# Patient Record
Sex: Male | Born: 1955 | Race: White | Hispanic: No | Marital: Married | Smoking: Never smoker
Health system: Southern US, Community
[De-identification: ages and names within clinical notes are randomized; demographics above are authoritative.]

## PROBLEM LIST (undated history)

## (undated) DIAGNOSIS — I10 Essential (primary) hypertension: Secondary | ICD-10-CM

## (undated) DIAGNOSIS — I219 Acute myocardial infarction, unspecified: Secondary | ICD-10-CM

## (undated) DIAGNOSIS — E119 Type 2 diabetes mellitus without complications: Secondary | ICD-10-CM

---

## 2009-07-01 DIAGNOSIS — N529 Male erectile dysfunction, unspecified: Secondary | ICD-10-CM | POA: Insufficient documentation

## 2009-07-01 DIAGNOSIS — E1121 Type 2 diabetes mellitus with diabetic nephropathy: Secondary | ICD-10-CM | POA: Insufficient documentation

## 2009-07-01 DIAGNOSIS — E7800 Pure hypercholesterolemia, unspecified: Secondary | ICD-10-CM | POA: Insufficient documentation

## 2009-07-01 DIAGNOSIS — J302 Other seasonal allergic rhinitis: Secondary | ICD-10-CM | POA: Insufficient documentation

## 2014-04-17 DIAGNOSIS — M5030 Other cervical disc degeneration, unspecified cervical region: Secondary | ICD-10-CM | POA: Insufficient documentation

## 2015-02-07 DIAGNOSIS — Z794 Long term (current) use of insulin: Secondary | ICD-10-CM | POA: Insufficient documentation

## 2015-05-23 DIAGNOSIS — R809 Proteinuria, unspecified: Secondary | ICD-10-CM | POA: Insufficient documentation

## 2019-12-21 DIAGNOSIS — E1165 Type 2 diabetes mellitus with hyperglycemia: Secondary | ICD-10-CM | POA: Insufficient documentation

## 2019-12-21 DIAGNOSIS — H35039 Hypertensive retinopathy, unspecified eye: Secondary | ICD-10-CM | POA: Insufficient documentation

## 2019-12-21 DIAGNOSIS — N182 Chronic kidney disease, stage 2 (mild): Secondary | ICD-10-CM | POA: Insufficient documentation

## 2019-12-21 DIAGNOSIS — D126 Benign neoplasm of colon, unspecified: Secondary | ICD-10-CM | POA: Insufficient documentation

## 2020-02-16 DIAGNOSIS — I2510 Atherosclerotic heart disease of native coronary artery without angina pectoris: Secondary | ICD-10-CM

## 2020-02-16 HISTORY — DX: Atherosclerotic heart disease of native coronary artery without angina pectoris: I25.10

## 2020-02-28 ENCOUNTER — Inpatient Hospital Stay (HOSPITAL_COMMUNITY): Payer: BC Managed Care – PPO

## 2020-02-28 ENCOUNTER — Inpatient Hospital Stay (HOSPITAL_COMMUNITY)
Admission: EM | Admit: 2020-02-28 | Discharge: 2020-03-08 | DRG: 286 | Disposition: A | Discharge status: Rehabilitation Facility | Payer: BC Managed Care – PPO | Attending: Internal Medicine | Admitting: Internal Medicine

## 2020-02-28 ENCOUNTER — Emergency Department (HOSPITAL_COMMUNITY): Payer: BC Managed Care – PPO

## 2020-02-28 ENCOUNTER — Inpatient Hospital Stay (HOSPITAL_COMMUNITY)
Admission: EM | Disposition: A | Discharge status: Rehabilitation Facility | Payer: Self-pay | Source: Home / Self Care | Attending: Internal Medicine

## 2020-02-28 ENCOUNTER — Encounter (HOSPITAL_COMMUNITY): Payer: Self-pay | Admitting: Specialist

## 2020-02-28 DIAGNOSIS — I213 ST elevation (STEMI) myocardial infarction of unspecified site: Secondary | ICD-10-CM | POA: Diagnosis not present

## 2020-02-28 DIAGNOSIS — R4701 Aphasia: Secondary | ICD-10-CM | POA: Diagnosis not present

## 2020-02-28 DIAGNOSIS — I462 Cardiac arrest due to underlying cardiac condition: Secondary | ICD-10-CM | POA: Diagnosis present

## 2020-02-28 DIAGNOSIS — I2510 Atherosclerotic heart disease of native coronary artery without angina pectoris: Secondary | ICD-10-CM

## 2020-02-28 DIAGNOSIS — R569 Unspecified convulsions: Secondary | ICD-10-CM | POA: Diagnosis not present

## 2020-02-28 DIAGNOSIS — R0989 Other specified symptoms and signs involving the circulatory and respiratory systems: Secondary | ICD-10-CM | POA: Diagnosis not present

## 2020-02-28 DIAGNOSIS — F10139 Alcohol abuse with withdrawal, unspecified: Secondary | ICD-10-CM | POA: Diagnosis not present

## 2020-02-28 DIAGNOSIS — E875 Hyperkalemia: Secondary | ICD-10-CM | POA: Diagnosis not present

## 2020-02-28 DIAGNOSIS — E785 Hyperlipidemia, unspecified: Secondary | ICD-10-CM | POA: Diagnosis present

## 2020-02-28 DIAGNOSIS — E118 Type 2 diabetes mellitus with unspecified complications: Secondary | ICD-10-CM | POA: Diagnosis not present

## 2020-02-28 DIAGNOSIS — J690 Pneumonitis due to inhalation of food and vomit: Secondary | ICD-10-CM | POA: Diagnosis not present

## 2020-02-28 DIAGNOSIS — G92 Toxic encephalopathy: Secondary | ICD-10-CM | POA: Diagnosis not present

## 2020-02-28 DIAGNOSIS — I350 Nonrheumatic aortic (valve) stenosis: Secondary | ICD-10-CM

## 2020-02-28 DIAGNOSIS — G9340 Encephalopathy, unspecified: Secondary | ICD-10-CM | POA: Diagnosis not present

## 2020-02-28 DIAGNOSIS — R0603 Acute respiratory distress: Secondary | ICD-10-CM

## 2020-02-28 DIAGNOSIS — G9341 Metabolic encephalopathy: Secondary | ICD-10-CM | POA: Diagnosis not present

## 2020-02-28 DIAGNOSIS — G931 Anoxic brain damage, not elsewhere classified: Secondary | ICD-10-CM | POA: Diagnosis not present

## 2020-02-28 DIAGNOSIS — Z20822 Contact with and (suspected) exposure to covid-19: Secondary | ICD-10-CM | POA: Diagnosis present

## 2020-02-28 DIAGNOSIS — N179 Acute kidney failure, unspecified: Secondary | ICD-10-CM | POA: Diagnosis not present

## 2020-02-28 DIAGNOSIS — E1165 Type 2 diabetes mellitus with hyperglycemia: Secondary | ICD-10-CM | POA: Diagnosis present

## 2020-02-28 DIAGNOSIS — I25111 Atherosclerotic heart disease of native coronary artery with angina pectoris with documented spasm: Secondary | ICD-10-CM | POA: Diagnosis present

## 2020-02-28 DIAGNOSIS — I472 Ventricular tachycardia: Secondary | ICD-10-CM | POA: Diagnosis not present

## 2020-02-28 DIAGNOSIS — I469 Cardiac arrest, cause unspecified: Secondary | ICD-10-CM

## 2020-02-28 DIAGNOSIS — J9601 Acute respiratory failure with hypoxia: Secondary | ICD-10-CM | POA: Diagnosis not present

## 2020-02-28 DIAGNOSIS — E119 Type 2 diabetes mellitus without complications: Secondary | ICD-10-CM | POA: Diagnosis not present

## 2020-02-28 DIAGNOSIS — I10 Essential (primary) hypertension: Secondary | ICD-10-CM | POA: Diagnosis present

## 2020-02-28 DIAGNOSIS — Z0189 Encounter for other specified special examinations: Secondary | ICD-10-CM

## 2020-02-28 DIAGNOSIS — D72829 Elevated white blood cell count, unspecified: Secondary | ICD-10-CM | POA: Diagnosis not present

## 2020-02-28 DIAGNOSIS — I4901 Ventricular fibrillation: Principal | ICD-10-CM

## 2020-02-28 DIAGNOSIS — I25110 Atherosclerotic heart disease of native coronary artery with unstable angina pectoris: Secondary | ICD-10-CM | POA: Diagnosis not present

## 2020-02-28 DIAGNOSIS — T17908A Unspecified foreign body in respiratory tract, part unspecified causing other injury, initial encounter: Secondary | ICD-10-CM

## 2020-02-28 DIAGNOSIS — R471 Dysarthria and anarthria: Secondary | ICD-10-CM | POA: Diagnosis not present

## 2020-02-28 DIAGNOSIS — F1010 Alcohol abuse, uncomplicated: Secondary | ICD-10-CM | POA: Diagnosis present

## 2020-02-28 DIAGNOSIS — R7401 Elevation of levels of liver transaminase levels: Secondary | ICD-10-CM | POA: Diagnosis not present

## 2020-02-28 DIAGNOSIS — I249 Acute ischemic heart disease, unspecified: Secondary | ICD-10-CM | POA: Diagnosis present

## 2020-02-28 DIAGNOSIS — J9600 Acute respiratory failure, unspecified whether with hypoxia or hypercapnia: Secondary | ICD-10-CM

## 2020-02-28 HISTORY — DX: Type 2 diabetes mellitus without complications: E11.9

## 2020-02-28 HISTORY — DX: Essential (primary) hypertension: I10

## 2020-02-28 HISTORY — DX: Cardiac arrest, cause unspecified: I46.9

## 2020-02-28 HISTORY — PX: LEFT HEART CATH AND CORONARY ANGIOGRAPHY: CATH118249

## 2020-02-28 LAB — BASIC METABOLIC PANEL
Anion gap: 11 (ref 5–15)
Anion gap: 15 (ref 5–15)
BUN: 25 mg/dL — ABNORMAL HIGH (ref 8–23)
BUN: 18 mg/dL (ref 8–23)
CO2: 20 mmol/L — ABNORMAL LOW (ref 22–32)
CO2: 19 mmol/L — ABNORMAL LOW (ref 22–32)
Calcium: 8.3 mg/dL — ABNORMAL LOW (ref 8.9–10.3)
Calcium: 8.2 mg/dL — ABNORMAL LOW (ref 8.9–10.3)
Chloride: 108 mmol/L (ref 98–111)
Chloride: 110 mmol/L (ref 98–111)
Creatinine, Ser: 1.29 mg/dL — ABNORMAL HIGH (ref 0.61–1.24)
Creatinine, Ser: 1.1 mg/dL (ref 0.61–1.24)
GFR calc Af Amer: >60 mL/min (ref >60)
GFR calc Af Amer: >60 mL/min (ref >60)
GFR calc non Af Amer: 59 mL/min — ABNORMAL LOW (ref >60)
GFR calc non Af Amer: >60 mL/min (ref >60)
Glucose, Bld: 217 mg/dL — ABNORMAL HIGH (ref 70–99)
Glucose, Bld: 179 mg/dL — ABNORMAL HIGH (ref 70–99)
Potassium: 4.5 mmol/L (ref 3.5–5.1)
Potassium: 5.9 mmol/L — ABNORMAL HIGH (ref 3.5–5.1)
Sodium: 139 mmol/L (ref 135–145)
Sodium: 144 mmol/L (ref 135–145)

## 2020-02-28 LAB — CBC
HCT: 40.5 % (ref 39.0–52.0)
Hemoglobin: 13.5 g/dL (ref 13.0–17.0)
MCH: 32.1 pg (ref 26.0–34.0)
MCHC: 33.3 g/dL (ref 30.0–36.0)
MCV: 96.4 fL (ref 80.0–100.0)
Platelets: 173 10*3/uL (ref 150–400)
RBC: 4.2 MIL/uL — ABNORMAL LOW (ref 4.22–5.81)
RDW: 11.7 % (ref 11.5–15.5)
WBC: 12.7 10*3/uL — ABNORMAL HIGH (ref 4.0–10.5)
nRBC: 0 % (ref 0.0–0.2)

## 2020-02-28 LAB — CBC WITH DIFFERENTIAL/PLATELET
Abs Immature Granulocytes: 0.5 10*3/uL — ABNORMAL HIGH (ref 0.00–0.07)
Basophils Absolute: 0.1 10*3/uL (ref 0.0–0.1)
Basophils Relative: 1 %
Eosinophils Absolute: 0.2 10*3/uL (ref 0.0–0.5)
Eosinophils Relative: 2 %
HCT: 44.8 % (ref 39.0–52.0)
Hemoglobin: 15.1 g/dL (ref 13.0–17.0)
Immature Granulocytes: 4 %
Lymphocytes Relative: 45 %
Lymphs Abs: 5.7 10*3/uL — ABNORMAL HIGH (ref 0.7–4.0)
MCH: 32.6 pg (ref 26.0–34.0)
MCHC: 33.7 g/dL (ref 30.0–36.0)
MCV: 96.8 fL (ref 80.0–100.0)
Monocytes Absolute: 0.9 10*3/uL (ref 0.1–1.0)
Monocytes Relative: 7 %
Neutro Abs: 5 10*3/uL (ref 1.7–7.7)
Neutrophils Relative %: 41 %
Platelets: 211 10*3/uL (ref 150–400)
RBC: 4.63 MIL/uL (ref 4.22–5.81)
RDW: 11.7 % (ref 11.5–15.5)
WBC: 12.4 10*3/uL — ABNORMAL HIGH (ref 4.0–10.5)
nRBC: 0 % (ref 0.0–0.2)

## 2020-02-28 LAB — POCT I-STAT, CHEM 8
BUN: 24 mg/dL — ABNORMAL HIGH (ref 8–23)
Calcium, Ion: 1.22 mmol/L (ref 1.15–1.40)
Chloride: 107 mmol/L (ref 98–111)
Creatinine, Ser: 1.2 mg/dL (ref 0.61–1.24)
Glucose, Bld: 240 mg/dL — ABNORMAL HIGH (ref 70–99)
HCT: 44 % (ref 39.0–52.0)
Hemoglobin: 15 g/dL (ref 13.0–17.0)
Potassium: 4.3 mmol/L (ref 3.5–5.1)
Sodium: 142 mmol/L (ref 135–145)
TCO2: 23 mmol/L (ref 22–32)

## 2020-02-28 LAB — GLUCOSE, CAPILLARY
Glucose-Capillary: 280 mg/dL — ABNORMAL HIGH (ref 70–99)
Glucose-Capillary: 201 mg/dL — ABNORMAL HIGH (ref 70–99)
Glucose-Capillary: 181 mg/dL — ABNORMAL HIGH (ref 70–99)
Glucose-Capillary: 175 mg/dL — ABNORMAL HIGH (ref 70–99)
Glucose-Capillary: 162 mg/dL — ABNORMAL HIGH (ref 70–99)

## 2020-02-28 LAB — POCT I-STAT 7, (LYTES, BLD GAS, ICA,H+H)
Acid-base deficit: 5 mmol/L — ABNORMAL HIGH (ref 0.0–2.0)
Bicarbonate: 21.6 mmol/L (ref 20.0–28.0)
Calcium, Ion: 1.2 mmol/L (ref 1.15–1.40)
HCT: 42 % (ref 39.0–52.0)
Hemoglobin: 14.3 g/dL (ref 13.0–17.0)
O2 Saturation: 100 %
Patient temperature: 36.8
Potassium: 4.5 mmol/L (ref 3.5–5.1)
Sodium: 141 mmol/L (ref 135–145)
TCO2: 23 mmol/L (ref 22–32)
pCO2 arterial: 44.3 mmHg (ref 32.0–48.0)
pH, Arterial: 7.296 — ABNORMAL LOW (ref 7.350–7.450)
pO2, Arterial: 216 mmHg — ABNORMAL HIGH (ref 83.0–108.0)

## 2020-02-28 LAB — COMPREHENSIVE METABOLIC PANEL
ALT: 100 U/L — ABNORMAL HIGH (ref 0–44)
AST: 135 U/L — ABNORMAL HIGH (ref 15–41)
Albumin: 3.9 g/dL (ref 3.5–5.0)
Alkaline Phosphatase: 41 U/L (ref 38–126)
Anion gap: 17 — ABNORMAL HIGH (ref 5–15)
BUN: 20 mg/dL (ref 8–23)
CO2: 15 mmol/L — ABNORMAL LOW (ref 22–32)
Calcium: 8.7 mg/dL — ABNORMAL LOW (ref 8.9–10.3)
Chloride: 105 mmol/L (ref 98–111)
Creatinine, Ser: 1.45 mg/dL — ABNORMAL HIGH (ref 0.61–1.24)
GFR calc Af Amer: 60 mL/min — ABNORMAL LOW (ref >60)
GFR calc non Af Amer: 52 mL/min — ABNORMAL LOW (ref >60)
Glucose, Bld: 268 mg/dL — ABNORMAL HIGH (ref 70–99)
Potassium: 3.8 mmol/L (ref 3.5–5.1)
Sodium: 137 mmol/L (ref 135–145)
Total Bilirubin: 0.4 mg/dL (ref 0.3–1.2)
Total Protein: 6.6 g/dL (ref 6.5–8.1)

## 2020-02-28 LAB — PROTIME-INR
INR: 1.1 (ref 0.8–1.2)
INR: 1.2 (ref 0.8–1.2)
INR: 1.1 (ref 0.8–1.2)
Prothrombin Time: 13.8 seconds (ref 11.4–15.2)
Prothrombin Time: 14.8 seconds (ref 11.4–15.2)
Prothrombin Time: 13.9 seconds (ref 11.4–15.2)

## 2020-02-28 LAB — RAPID URINE DRUG SCREEN, HOSP PERFORMED
Amphetamines: NOT DETECTED
Barbiturates: NOT DETECTED
Benzodiazepines: POSITIVE — AB
Cocaine: NOT DETECTED
Opiates: NOT DETECTED
Tetrahydrocannabinol: NOT DETECTED

## 2020-02-28 LAB — LIPID PANEL
Cholesterol: 272 mg/dL — ABNORMAL HIGH (ref 0–200)
LDL Cholesterol: UNDETERMINED mg/dL (ref 0–99)
Triglycerides: 1391 mg/dL — ABNORMAL HIGH (ref <150)
VLDL: UNDETERMINED mg/dL (ref 0–40)

## 2020-02-28 LAB — LDL CHOLESTEROL, DIRECT: Direct LDL: 61.9 mg/dL (ref 0–99)

## 2020-02-28 LAB — ECHOCARDIOGRAM COMPLETE
Height: 68 in
Weight: 3375.68 oz

## 2020-02-28 LAB — TROPONIN I (HIGH SENSITIVITY)
Troponin I (High Sensitivity): 60 ng/L — ABNORMAL HIGH (ref <18)
Troponin I (High Sensitivity): 992 ng/L (ref <18)

## 2020-02-28 LAB — HEMOGLOBIN A1C
Hgb A1c MFr Bld: 8.2 % — ABNORMAL HIGH (ref 4.8–5.6)
Mean Plasma Glucose: 188.64 mg/dL

## 2020-02-28 LAB — MRSA PCR SCREENING: MRSA by PCR: NEGATIVE

## 2020-02-28 LAB — APTT
aPTT: 27 seconds (ref 24–36)
aPTT: 25 seconds (ref 24–36)
aPTT: 39 seconds — ABNORMAL HIGH (ref 24–36)

## 2020-02-28 LAB — SARS CORONAVIRUS 2 BY RT PCR (HOSPITAL ORDER, PERFORMED IN ~~LOC~~ HOSPITAL LAB): SARS Coronavirus 2: NEGATIVE

## 2020-02-28 LAB — HEPARIN LEVEL (UNFRACTIONATED): Heparin Unfractionated: <0.1 IU/mL — ABNORMAL LOW (ref 0.30–0.70)

## 2020-02-28 SURGERY — LEFT HEART CATH AND CORONARY ANGIOGRAPHY
Anesthesia: LOCAL

## 2020-02-28 MED ORDER — SODIUM CHLORIDE 0.9 % IV SOLN
INTRAVENOUS | Status: AC | PRN
  Administered 2020-02-28: 1000 mL via INTRAVENOUS

## 2020-02-28 MED ORDER — NITROGLYCERIN IN D5W 200-5 MCG/ML-% IV SOLN
INTRAVENOUS | Status: AC
  Filled 2020-02-28: qty 250

## 2020-02-28 MED ORDER — ASPIRIN 81 MG PO CHEW
81.0000 mg | CHEWABLE_TABLET | Freq: Every day | ORAL | Status: DC
  Administered 2020-02-28 – 2020-03-01 (×3): 81 mg
  Filled 2020-02-28 (×3): qty 1

## 2020-02-28 MED ORDER — LABETALOL HCL 5 MG/ML IV SOLN: 10.0000 mg | INTRAVENOUS | Status: AC | PRN

## 2020-02-28 MED ORDER — PROPOFOL 1000 MG/100ML IV EMUL
INTRAVENOUS | Status: AC
  Filled 2020-02-28: qty 100

## 2020-02-28 MED ORDER — SODIUM CHLORIDE 0.9% FLUSH: 3.0000 mL | INTRAVENOUS | Status: DC | PRN

## 2020-02-28 MED ORDER — ATORVASTATIN CALCIUM 80 MG PO TABS: 80.0000 mg | ORAL_TABLET | Freq: Every day | ORAL | Status: DC

## 2020-02-28 MED ORDER — CHLORHEXIDINE GLUCONATE CLOTH 2 % EX PADS
6.0000 | MEDICATED_PAD | Freq: Every day | CUTANEOUS | Status: DC
  Administered 2020-02-29 – 2020-03-05 (×5): 6 via TOPICAL

## 2020-02-28 MED ORDER — FENTANYL 2500MCG IN NS 250ML (10MCG/ML) PREMIX INFUSION
50.0000 ug/h | INTRAVENOUS | Status: DC
  Administered 2020-02-28: 300 ug/h via INTRAVENOUS
  Administered 2020-02-28: 50 ug/h via INTRAVENOUS
  Administered 2020-02-28: 275 ug/h via INTRAVENOUS
  Administered 2020-02-29: 150 ug/h via INTRAVENOUS
  Administered 2020-02-29: 325 ug/h via INTRAVENOUS
  Administered 2020-03-01: 100 ug/h via INTRAVENOUS
  Filled 2020-02-28 (×5): qty 250

## 2020-02-28 MED ORDER — LIDOCAINE HCL (PF) 1 % IJ SOLN
INTRAMUSCULAR | Status: AC
  Filled 2020-02-28: qty 30

## 2020-02-28 MED ORDER — FENTANYL BOLUS VIA INFUSION
10.0000 ug | INTRAVENOUS | Status: DC | PRN
  Filled 2020-02-28: qty 30

## 2020-02-28 MED ORDER — ASPIRIN 300 MG RE SUPP
300.0000 mg | Freq: Once | RECTAL | Status: AC
  Administered 2020-02-28: 300 mg via RECTAL

## 2020-02-28 MED ORDER — SODIUM CHLORIDE 0.9 % IV SOLN
INTRAVENOUS | Status: DC
  Administered 2020-02-29: 1000 mL via INTRAVENOUS

## 2020-02-28 MED ORDER — PROSOURCE TF PO LIQD
45.0000 mL | Freq: Three times a day (TID) | ORAL | Status: DC
  Administered 2020-02-28 – 2020-03-01 (×6): 45 mL
  Filled 2020-02-28 (×6): qty 45

## 2020-02-28 MED ORDER — FENTANYL 2500MCG IN NS 250ML (10MCG/ML) PREMIX INFUSION
0.0000 ug/h | Freq: Once | INTRAVENOUS | Status: AC
  Administered 2020-02-28 (×3): 25 ug/h via INTRAVENOUS
  Filled 2020-02-28: qty 250

## 2020-02-28 MED ORDER — ASPIRIN 81 MG PO CHEW: 324.0000 mg | CHEWABLE_TABLET | Freq: Once | ORAL | Status: DC

## 2020-02-28 MED ORDER — ACETAMINOPHEN 325 MG PO TABS: 650.0000 mg | ORAL_TABLET | ORAL | Status: DC | PRN

## 2020-02-28 MED ORDER — HEPARIN (PORCINE) 25000 UT/250ML-% IV SOLN
1600.0000 [IU]/h | INTRAVENOUS | Status: DC
  Administered 2020-02-28: 900 [IU]/h via INTRAVENOUS
  Administered 2020-02-29: 1500 [IU]/h via INTRAVENOUS
  Administered 2020-03-01: 1600 [IU]/h via INTRAVENOUS
  Administered 2020-03-01: 1650 [IU]/h via INTRAVENOUS
  Administered 2020-03-02 – 2020-03-03 (×2): 1750 [IU]/h via INTRAVENOUS
  Administered 2020-03-03: 1700 [IU]/h via INTRAVENOUS
  Administered 2020-03-04: 1650 [IU]/h via INTRAVENOUS
  Administered 2020-03-04: 1700 [IU]/h via INTRAVENOUS
  Filled 2020-02-28 (×9): qty 250

## 2020-02-28 MED ORDER — CHLORHEXIDINE GLUCONATE 0.12% ORAL RINSE (MEDLINE KIT)
15.0000 mL | Freq: Two times a day (BID) | OROMUCOSAL | Status: DC
  Administered 2020-02-28 – 2020-03-01 (×5): 15 mL via OROMUCOSAL

## 2020-02-28 MED ORDER — ONDANSETRON HCL 4 MG/2ML IJ SOLN: 4.0000 mg | Freq: Four times a day (QID) | INTRAMUSCULAR | Status: DC | PRN

## 2020-02-28 MED ORDER — INSULIN ASPART 100 UNIT/ML ~~LOC~~ SOLN
0.0000 [IU] | Freq: Three times a day (TID) | SUBCUTANEOUS | Status: DC
  Administered 2020-02-28: 3 [IU] via SUBCUTANEOUS

## 2020-02-28 MED ORDER — SODIUM CHLORIDE 0.9 % IV SOLN
INTRAVENOUS | Status: AC | PRN
  Administered 2020-02-28: 50 mL/h via INTRAVENOUS

## 2020-02-28 MED ORDER — FAMOTIDINE IN NACL 20-0.9 MG/50ML-% IV SOLN
20.0000 mg | Freq: Two times a day (BID) | INTRAVENOUS | Status: DC
  Administered 2020-02-28 (×2): 20 mg via INTRAVENOUS
  Filled 2020-02-28 (×2): qty 50

## 2020-02-28 MED ORDER — HEPARIN SODIUM (PORCINE) 5000 UNIT/ML IJ SOLN
4000.0000 [IU] | Freq: Once | INTRAMUSCULAR | Status: AC
  Administered 2020-02-28: 4000 [IU] via INTRAVENOUS
  Filled 2020-02-28: qty 1

## 2020-02-28 MED ORDER — VITAL AF 1.2 CAL PO LIQD
1000.0000 mL | ORAL | Status: DC
  Administered 2020-02-28 – 2020-03-01 (×3): 1000 mL
  Filled 2020-02-28 (×2): qty 1000

## 2020-02-28 MED ORDER — HYDRALAZINE HCL 20 MG/ML IJ SOLN: 10.0000 mg | INTRAMUSCULAR | Status: AC | PRN

## 2020-02-28 MED ORDER — SUCCINYLCHOLINE CHLORIDE 20 MG/ML IJ SOLN
INTRAMUSCULAR | Status: AC | PRN
  Administered 2020-02-28: 100 mg via INTRAVENOUS

## 2020-02-28 MED ORDER — ATORVASTATIN CALCIUM 80 MG PO TABS
80.0000 mg | ORAL_TABLET | Freq: Every day | ORAL | Status: DC
  Administered 2020-02-28 – 2020-03-01 (×3): 80 mg
  Filled 2020-02-28 (×3): qty 1

## 2020-02-28 MED ORDER — ORAL CARE MOUTH RINSE
15.0000 mL | OROMUCOSAL | Status: DC
  Administered 2020-02-28 – 2020-03-01 (×23): 15 mL via OROMUCOSAL

## 2020-02-28 MED ORDER — MIDAZOLAM 50MG/50ML (1MG/ML) PREMIX INFUSION
0.5000 mg/h | Freq: Once | INTRAVENOUS | Status: AC
  Administered 2020-02-28 (×3): 1 mg/h via INTRAVENOUS
  Filled 2020-02-28: qty 50

## 2020-02-28 MED ORDER — IOHEXOL 350 MG/ML SOLN
INTRAVENOUS | Status: AC
  Filled 2020-02-28: qty 1

## 2020-02-28 MED ORDER — PROPOFOL 1000 MG/100ML IV EMUL
0.0000 ug/kg/min | INTRAVENOUS | Status: DC
  Administered 2020-02-28: 5 ug/kg/min via INTRAVENOUS
  Administered 2020-02-28: 25 ug/kg/min via INTRAVENOUS
  Administered 2020-02-29: 35 ug/kg/min via INTRAVENOUS
  Administered 2020-02-29: 25 ug/kg/min via INTRAVENOUS
  Administered 2020-02-29: 10 ug/kg/min via INTRAVENOUS
  Administered 2020-03-01: 30 ug/kg/min via INTRAVENOUS
  Filled 2020-02-28 (×6): qty 100

## 2020-02-28 MED ORDER — NOREPINEPHRINE 4 MG/250ML-% IV SOLN: 0.0000 ug/min | INTRAVENOUS | Status: DC

## 2020-02-28 MED ORDER — VERAPAMIL HCL 2.5 MG/ML IV SOLN
INTRAVENOUS | Status: DC | PRN
  Administered 2020-02-28: 10 mL via INTRA_ARTERIAL

## 2020-02-28 MED ORDER — NITROGLYCERIN IN D5W 200-5 MCG/ML-% IV SOLN
0.0000 ug/min | INTRAVENOUS | Status: DC
  Administered 2020-02-28: 20 ug/min via INTRAVENOUS

## 2020-02-28 MED ORDER — SODIUM CHLORIDE 0.9 % IV SOLN: INTRAVENOUS | Status: AC

## 2020-02-28 MED ORDER — SODIUM CHLORIDE 0.9 % IV SOLN: 250.0000 mL | INTRAVENOUS | Status: DC | PRN

## 2020-02-28 MED ORDER — VERAPAMIL HCL 2.5 MG/ML IV SOLN
INTRAVENOUS | Status: AC
  Filled 2020-02-28: qty 2

## 2020-02-28 MED ORDER — LIDOCAINE HCL (PF) 1 % IJ SOLN
INTRAMUSCULAR | Status: DC | PRN
  Administered 2020-02-28: 2 mL

## 2020-02-28 MED ORDER — SODIUM CHLORIDE 0.9% FLUSH
3.0000 mL | Freq: Two times a day (BID) | INTRAVENOUS | Status: DC
  Administered 2020-02-28 – 2020-03-08 (×15): 3 mL via INTRAVENOUS

## 2020-02-28 MED ORDER — IOHEXOL 350 MG/ML SOLN
INTRAVENOUS | Status: DC | PRN
  Administered 2020-02-28: 60 mL via INTRA_ARTERIAL

## 2020-02-28 MED ORDER — SODIUM CHLORIDE 0.9 % IV SOLN: INTRAVENOUS | Status: DC

## 2020-02-28 MED ORDER — HEPARIN (PORCINE) IN NACL 1000-0.9 UT/500ML-% IV SOLN
INTRAVENOUS | Status: AC
  Filled 2020-02-28: qty 500

## 2020-02-28 MED ORDER — ACETAMINOPHEN 160 MG/5ML PO SOLN
650.0000 mg | ORAL | Status: DC | PRN
  Administered 2020-02-28 – 2020-03-01 (×3): 650 mg
  Filled 2020-02-28 (×4): qty 20.3

## 2020-02-28 MED ORDER — NITROGLYCERIN IN D5W 200-5 MCG/ML-% IV SOLN
INTRAVENOUS | Status: AC | PRN
  Administered 2020-02-28: 10 ug/min via INTRAVENOUS

## 2020-02-28 MED ORDER — ASPIRIN 81 MG PO CHEW: 81.0000 mg | CHEWABLE_TABLET | Freq: Every day | ORAL | Status: DC

## 2020-02-28 MED ORDER — INSULIN ASPART 100 UNIT/ML ~~LOC~~ SOLN
0.0000 [IU] | SUBCUTANEOUS | Status: DC
  Administered 2020-02-28 – 2020-02-29 (×4): 2 [IU] via SUBCUTANEOUS
  Administered 2020-02-29 – 2020-03-01 (×3): 1 [IU] via SUBCUTANEOUS
  Administered 2020-03-01: 2 [IU] via SUBCUTANEOUS
  Administered 2020-03-01: 1 [IU] via SUBCUTANEOUS
  Administered 2020-03-01: 2 [IU] via SUBCUTANEOUS
  Administered 2020-03-01 – 2020-03-03 (×6): 1 [IU] via SUBCUTANEOUS
  Administered 2020-03-04: 2 [IU] via SUBCUTANEOUS
  Administered 2020-03-04 (×2): 1 [IU] via SUBCUTANEOUS
  Administered 2020-03-04: 5 [IU] via SUBCUTANEOUS
  Administered 2020-03-04 (×2): 1 [IU] via SUBCUTANEOUS
  Administered 2020-03-05: 5 [IU] via SUBCUTANEOUS
  Administered 2020-03-05: 2 [IU] via SUBCUTANEOUS
  Administered 2020-03-05: 3 [IU] via SUBCUTANEOUS
  Administered 2020-03-05: 2 [IU] via SUBCUTANEOUS

## 2020-02-28 MED ORDER — FENTANYL CITRATE (PF) 100 MCG/2ML IJ SOLN
50.0000 ug | Freq: Once | INTRAMUSCULAR | Status: AC
  Administered 2020-02-28: 50 ug via INTRAVENOUS

## 2020-02-28 SURGICAL SUPPLY — 13 items
CATH INFINITI 5 FR JL3.5 (CATHETERS) ×2 IMPLANT
CATH INFINITI JR4 5F (CATHETERS) ×2 IMPLANT
CATH OPTITORQUE TIG 4.0 5F (CATHETERS) ×2 IMPLANT
DEVICE RAD COMP TR BAND LRG (VASCULAR PRODUCTS) ×2 IMPLANT
GLIDESHEATH SLEND SS 6F .021 (SHEATH) ×2 IMPLANT
GUIDEWIRE INQWIRE 1.5J.035X260 (WIRE) ×1 IMPLANT
INQWIRE 1.5J .035X260CM (WIRE) ×2
KIT ENCORE 26 ADVANTAGE (KITS) ×2 IMPLANT
KIT HEART LEFT (KITS) ×2 IMPLANT
PACK CARDIAC CATHETERIZATION (CUSTOM PROCEDURE TRAY) ×2 IMPLANT
SHEATH PROBE COVER 6X72 (BAG) ×2 IMPLANT
TRANSDUCER W/STOPCOCK (MISCELLANEOUS) ×2 IMPLANT
TUBING CIL FLEX 10 FLL-RA (TUBING) ×2 IMPLANT

## 2020-02-28 NOTE — Progress Notes (Signed)
RT attempted aline twice with any success. Second RT attempted twice without any success. RN aware. RT will draw ABG.

## 2020-02-28 NOTE — Progress Notes (Signed)
EEG complete - results pending 

## 2020-02-28 NOTE — Progress Notes (Signed)
ANTICOAGULATION CONSULT NOTE  Pharmacy Consult for heparin Indication: CAD awaiting CVTS consult  No Known Allergies  Patient Measurements: Height: 5\' 8"  (172.7 cm) Weight: 95.7 kg (210 lb 15.7 oz) IBW/kg (Calculated) : 68.4 Heparin Dosing Weight: 90kg  Vital Signs: Temp: 97.7 F (36.5 C) (07/13 1800) Temp Source: Bladder (07/13 1700) BP: 128/73 (07/13 1800) Pulse Rate: 72 (07/13 1800)  Labs: Recent Labs    02/28/20 0126 02/28/20 0126 02/28/20 0506 02/28/20 0543 02/28/20 1609  HGB 15.1   < > 14.3 13.5  --   HCT 44.8  --  42.0 40.5  --   PLT 211  --   --  173  --   APTT 27  --   --  25  --   LABPROT 13.8  --   --  14.8  --   INR 1.1  --   --  1.2  --   HEPARINUNFRC  --   --   --   --  <0.10*  CREATININE 1.45*  --   --  1.29*  --   TROPONINIHS 60*  --   --  992*  --    < > = values in this interval not displayed.    Estimated Creatinine Clearance: 67.4 mL/min (A) (by C-G formula based on SCr of 1.29 mg/dL (H)).   Assessment: 64yo male had witnessed VF arrest, called as code STEMI and sent emergently to cath lab which revealed diffuse multivessel CAD >> awaiting CVTS consult.  Heparin resumed post cath and heparin level is sub-therapeutic.  No issue with heparin infusion nor bleeding per RN.  Goal of Therapy:  Heparin level 0.3-0.7 units/ml Monitor platelets by anticoagulation protocol: Yes   Plan:  Increase heparin gtt to 1200 units/hr Check 6 hr heparin level  Parlee Amescua D. Mina Marble, PharmD, BCPS, [place] 02/28/2020, 6:05 PM

## 2020-02-28 NOTE — Progress Notes (Signed)
Wasted remainder of Versed drip (29ml) with Diamantina Providence, RN in stericycle.

## 2020-02-28 NOTE — ED Notes (Signed)
Rectal aspirin given

## 2020-02-28 NOTE — Progress Notes (Signed)
eLink Physician-Brief Progress Note Patient Name: Richard Hatfield DOB: 07/15/1956 MRN: [id number]   Date of Service  02/28/2020  HPI/Events of Note  BP soft = 75/56 - Nursing request for A-line.   eICU Interventions  Plan: 1. RT to place A-line.      Intervention Category Major Interventions: Hypotension - evaluation and management  Onetta Spainhower [place] 02/28/2020, 4:12 AM

## 2020-02-28 NOTE — Progress Notes (Addendum)
ANTICOAGULATION CONSULT NOTE - Initial Consult  Pharmacy Consult for heparin Indication: CAD awaiting CVTS consult  Not on File  Patient Measurements: Height: 5\' 8"  (172.7 cm) Weight: 95.7 kg (210 lb 15.7 oz) IBW/kg (Calculated) : 68.4 Heparin Dosing Weight: 90kg  Vital Signs: Temp: 98.1 F (36.7 C) (07/13 0315) Temp Source: Oral (07/13 0315) BP: 133/84 (07/13 0315) Pulse Rate: 75 (07/13 0315)  Labs: Recent Labs    02/28/20 0126  HGB 15.1  HCT 44.8  PLT 211  APTT 27  LABPROT 13.8  INR 1.1  CREATININE 1.45*  TROPONINIHS 60*    Estimated Creatinine Clearance: 60 mL/min (A) (by C-G formula based on SCr of 1.45 mg/dL (H)).   Assessment: 64yo male had witnessed VF arrest, called as code STEMI and sent emergently to cath lab which revealed diffuse multivessel CAD >> awaiting CVTS consult, to begin heparin 8h after sheath removal (removed 7/13 0245).  Goal of Therapy:  Heparin level 0.3-0.7 units/ml Monitor platelets by anticoagulation protocol: Yes   Plan:  At 1045 will begin heparin gtt at 1200 units/hr and monitor heparin levels and CBC.  Wynona Neat, PharmD, BCPS  02/28/2020,3:17 AM   Addendum: Pt now to begin therapeutic hypothermia.  Will lower starting heparin rate to 900 units/hr and monitor closely. VB 3:56 AM

## 2020-02-28 NOTE — ED Provider Notes (Signed)
[facility] EMERGENCY DEPARTMENT Provider Note   CSN: [id number] Arrival date & time: 02/28/20  0113  History Chief Complaint  Patient presents with  . Code STEMI    Richard Hatfield is a 64 y.o. male.  The history is provided by the EMS personnel. The history is limited by the condition of the patient (Patient unresponsive).  He was brought in by ambulance following cardiac arrest and successful return of spontaneous circulation.  His wife reported that he woke up and acted funny and collapsed on the bed.  She called 911 who instructed her on bystander CPR.  EMS arrived and noted initial rhythm of ventricular fibrillation.  He received epinephrine and was defibrillated with successful return of spontaneous circulation.  EMS states 12 minutes of CPR by them and 20 minutes of CPR total.  He was intubated with a King airway and had a right tibial intraosseous line inserted.  He is reported to have a history of hypertension but no known cardiac history.  No past medical history on file.  There are no problems to display for this patient.   ** The histories are not reviewed yet. Please review them in the "History" navigator section and refresh this [place].     No family history on file.  Social History   Tobacco Use  . Smoking status: Not on file  Substance Use Topics  . Alcohol use: Not on file  . Drug use: Not on file    Home Medications Prior to Admission medications   Not on File    Allergies    Patient has no allergy information on record.  Review of Systems   Review of Systems  Unable to perform ROS: Patient unresponsive    Physical Exam Updated Vital Signs BP 136/74   Pulse 82   Resp (!) 22   Ht 5\' 8"  (1.727 m)   Wt 95.8 kg   SpO2 100%   BMI 32.13 kg/m   Physical Exam Vitals and nursing note reviewed.   64 year old male intubated with [facility] airway.  There is some spontaneous respiratory effort. Vital signs are significant for  slightly increased respiratory rate. Oxygen saturation is 100%, which is normal. Head is normocephalic and atraumatic.  Pupils are 5 mm and unreactive.  King airway is in place. Neck has no adenopathy or JVD. Lungs are clear without rales, wheezes, or rhonchi. Chest moves symmetrically.  There is no crepitus. Heart has regular rate and rhythm without murmur. Abdomen is soft, flat. Extremities have trace edema. Skin is warm and dry without rash. Neurologic: Unresponsive to deep painful stimuli, no spontaneous movement.  Slight respiratory effort noted.   ED Results / Procedures / Treatments   Labs (all labs ordered are listed, but only abnormal results are displayed) Labs Reviewed  HEMOGLOBIN A1C - Abnormal; Notable for the following components:      Result Value   Hgb A1c MFr Bld 8.2 (*)    All other components within normal limits  CBC WITH DIFFERENTIAL/PLATELET - Abnormal; Notable for the following components:   WBC 12.4 (*)    Lymphs Abs 5.7 (*)    Abs Immature Granulocytes 0.50 (*)    All other components within normal limits  SARS CORONAVIRUS 2 BY RT PCR (HOSPITAL ORDER, [facility])  PROTIME-INR  APTT  COMPREHENSIVE METABOLIC PANEL  LIPID PANEL  BLOOD GAS, ARTERIAL  TROPONIN I (HIGH SENSITIVITY)    EKG EKG Interpretation  Date/Time:  Tuesday February 28 2020 01:15:31 EDT Ventricular Rate:  80 PR Interval:    QRS Duration: 84 QT Interval:  338 QTC Calculation: 390 R Axis:   73 Text Interpretation: Sinus rhythm Low voltage, precordial leads Anteroseptal infarct, old Nonspecific T abnormalities, lateral leads Slight ST elevation V3, V4, V5 Confirmed by Delora Fuel ([id number]) on 02/28/2020 1:34:06 AM   Radiology DG Chest Portable 1 View  Result Date: 02/28/2020 CLINICAL DATA:  64 year old male intubated.  Cardiac arrest at home. EXAM: PORTABLE CHEST 1 VIEW COMPARISON:  None. FINDINGS: Portable AP supine view at 0134 hours. Endotracheal tube tip at  the level the clavicles. Enteric tube courses into the left upper quadrant, tip not included. Mildly low lung volumes. Normal cardiac size and mediastinal contours. Allowing for portable technique the lungs are clear. No acute osseous abnormality identified. Paucity of bowel gas. IMPRESSION: 1. Satisfactory placement of endotracheal tube and enteric tube. 2.  No acute cardiopulmonary abnormality. Electronically Signed   By: Genevie Ann M.D.   On: 02/28/2020 01:45    Procedures Date/Time: 02/28/2020 1:25 AM Performed by: Delora Fuel, MD Pre-anesthesia Checklist: Patient identified, Emergency Drugs available, Suction available, Patient being monitored and Timeout performed Oxygen Delivery Method: Ambu bag Preoxygenation: Pre-oxygenation with 100% oxygen Induction Type: IV induction Laryngoscope Size: Glidescope and 4 Grade View: Grade I Tube size: 8.0 mm Number of attempts: 1 Airway Equipment and Method: Video-laryngoscopy and Rigid stylet Placement Confirmation: ETT inserted through vocal cords under direct vision,  Positive ETCO2 and Breath sounds checked- equal and bilateral Secured at: 25 cm Tube secured with: ETT holder Dental Injury: Teeth and Oropharynx as per pre-operative assessment  Comments: Pre procedure ventilation through [facility] Airway. Post procedure x-ray obtained showing satisfavtory tube placement.       CRITICAL CARE Performed by: Delora Fuel Total critical care time: 45 minutes Critical care time was exclusive of separately billable procedures and treating other patients. Critical care was necessary to treat or prevent imminent or life-threatening deterioration. Critical care was time spent personally by me on the following activities: development of treatment plan with patient and/or surrogate as well as nursing, discussions with consultants, evaluation of patient's response to treatment, examination of patient, obtaining history from patient or surrogate, ordering and  performing treatments and interventions, ordering and review of laboratory studies, ordering and review of radiographic studies, pulse oximetry and re-evaluation of patient's condition.  Medications Ordered in ED Medications  0.9 %  sodium chloride infusion ( Intravenous New Bag/Given 02/28/20 0144)  heparin injection 4,000 Units (4,000 Units Intravenous Given 02/28/20 0141)  succinylcholine (ANECTINE) injection (100 mg Intravenous Given 02/28/20 0124)  0.9 %  sodium chloride infusion (1,000 mLs Intravenous New Bag/Given 02/28/20 0125)  aspirin suppository 300 mg (300 mg Rectal Given 02/28/20 0145)    ED Course  I have reviewed the triage vital signs and the nursing notes.  Pertinent labs & imaging results that were available during my care of the patient were reviewed by me and considered in my medical decision making (see chart for details).  MDM Rules/Calculators/A&P Out of hospital cardiac arrest from ventricular fibrillation with successful resuscitation and return of spontaneous circulation.  ECG transmitted by EMS showed slight ST elevation in V4 and V5, so code STEMI was activated.  ECG here also shows very minimal ST elevation in V3-V4-V5.  Patient was seen in conjunction with Dr. Ailene Ravel cardiology service.  Case discussed with Dr. Claiborne Billings, on-call for STEMI, who agrees to take the patient to the catheterization lab.  King airway  was removed and ET tube placed.  Case is discussed with Dr. Oletta Darter of critical care service who agrees to see the patient in consultation.  Critical care service will make decision on whether to do therapeutic hypothermia.  Final Clinical Impression(s) / ED Diagnoses Final diagnoses:  Cardiac arrest ([place])  Acute ST elevation myocardial infarction (STEMI), unspecified artery [facility])    Rx / DC Orders ED Discharge Orders    None       Delora Fuel, MD 50/53/97 [phone]

## 2020-02-28 NOTE — Progress Notes (Signed)
Pt transported to Cath Lab from ED Trauma B without complication. Pt respiratory status stable throughout transport. RT will continue to monitor.

## 2020-02-28 NOTE — Progress Notes (Signed)
Initial Nutrition Assessment  DOCUMENTATION CODES:   Not applicable  INTERVENTION:   Tube feeding:  -Vital AF 1.2 @ 60 ml/hr via OGT (1440 ml) -45 ml ProSource TID  Provides: 1848 kcal (1925 kcal with propofol), 141 grams protein, 1168 ml free water.   NUTRITION DIAGNOSIS:   Increased nutrient needs related to acute illness as evidenced by estimated needs.  GOAL:   Patient will meet greater than or equal to 90% of their needs   MONITOR:   Vent status, Skin, TF tolerance, Weight trends, Labs, I & O's  REASON FOR ASSESSMENT:   Ventilator    ASSESSMENT:   Patient with PMH significant for CAD. Presents this admission s/p cardiac arrest.  7/13- heart cath, no stenting   Pt discussed during ICU rounds and with RN.   On TTM36. Undergoing EEG. Off pressors. Requiring low dose propofol. Okay to start feeding per CCM. OG confirmed in stomach per CXR.   Weight history limited over the last year. Utilize 95.7 kg as EDW for now.   Patient is currently intubated on ventilator support MV: 10.3 L/min Temp (24hrs), Avg:97.3 F (36.3 C), Min:96.6 F (35.9 C), Max:98.2 F (36.8 C)  Propofol: 2.9 ml/hr- provides 77 kcal from lipids daily    I/O: -491 ml since admit  UOP: 1,055 ml x 24 hrs   Drips: NS @ 125 ml/hr, propofol Medications: SS novolog  Labs: CBG 181-280  NUTRITION - FOCUSED PHYSICAL EXAM:    Most Recent Value  Orbital Region No depletion  Upper Arm Region No depletion  Thoracic and Lumbar Region Unable to assess  Buccal Region Unable to assess  Temple Region No depletion  Clavicle Bone Region No depletion  Clavicle and Acromion Bone Region No depletion  Scapular Bone Region Unable to assess  Dorsal Hand Unable to assess  Patellar Region Unable to assess  Anterior Thigh Region Unable to assess  Posterior Calf Region Unable to assess  Edema (RD Assessment) Unable to assess  Hair Reviewed  Eyes Unable to assess  Mouth Unable to assess  Skin Reviewed   Nails Unable to assess     Diet Order:   Diet Order            Diet NPO time specified  Diet effective now                 EDUCATION NEEDS:   Not appropriate for education at this time  Skin:  Skin Assessment: Reviewed RN Assessment  Last BM:  PTA  Height:   Ht Readings from Last 1 Encounters:  02/28/20 5\' 8"  (1.727 m)    Weight:   Wt Readings from Last 1 Encounters:  02/28/20 95.7 kg    Ideal Body Weight:  70 kg  BMI:  Body mass index is 32.08 kg/m.  Estimated Nutritional Needs:   Kcal:  1918 kcal  Protein:  140-160 grams  Fluid:  >/= 1.9 L/day   Mariana Single RD, LDN Clinical Nutrition Pager listed in [place]

## 2020-02-28 NOTE — Progress Notes (Signed)
Seen, no events. Does wake up but nonpurposeful. Finish out usual TTM protocol, check EEG, f/u head CT read.  Erskine Emery MD PCCM

## 2020-02-28 NOTE — Progress Notes (Signed)
Cardiology Rounding Note:  Pt admitted earlier this am following cardiac arrest. Please see Dr. Evette Georges full cardiology note and cath note from this am. Cardiac cath with diffuse three vessel CAD but no focal culprits for PCI. No stenting performed. He is now being cooled by the PCCM team.   Cardiology will follow along. No new recommendations this am.   Lauree Chandler 02/28/2020 10:13 AM

## 2020-02-28 NOTE — Procedures (Signed)
Intubation Procedure Note  Ronson Hagins  [id number]  08-27-1955  Date:02/28/20  Time:2:21 AM   Provider Performing:Skilar Marcou M Verona Hartshorn    Procedure: Intubation (36681)  Indication(s) Respiratory Failure  Consent Unable to obtain consent due to emergent nature of procedure.   Anesthesia    Time Out Verified patient identification, verified procedure, site/side was marked, verified correct patient position, special equipment/implants available, medications/allergies/relevant history reviewed, required imaging and test results available.   Sterile Technique Usual hand hygeine, masks, and gloves were used   Procedure Description Patient positioned in bed supine.  Sedation given as noted above.  Patient was intubated with endotracheal tube using Glidescope.  View was Grade 1 full glottis .  Number of attempts was 1.  Colorimetric CO2 detector was consistent with tracheal placement.   Complications/Tolerance None; patient tolerated the procedure well. Chest X-ray is ordered to verify placement.   EBL  Pt intubated by ED MD.    Specimen(s) None  Cordella Register, RRT, RCP

## 2020-02-28 NOTE — Plan of Care (Signed)
  Problem: Clinical Measurements: Goal: Will remain free from infection Outcome: Progressing Goal: Diagnostic test results will improve Outcome: Progressing   Problem: Coping: Goal: Level of anxiety will decrease Outcome: Progressing   Problem: Elimination: Goal: Will not experience complications related to bowel motility Outcome: Progressing Goal: Will not experience complications related to urinary retention Outcome: Progressing   Problem: Pain Managment: Goal: General experience of comfort will improve Outcome: Progressing   Problem: Safety: Goal: Ability to remain free from injury will improve Outcome: Progressing   Problem: Skin Integrity: Goal: Risk for impaired skin integrity will be minimized. Outcome: Progressing   Problem: Cardiovascular: Goal: Vascular access site(s) Level 0-1 will be maintained Outcome: Progressing   Problem: Education: Goal: Knowledge of General Education information will improve Description: Including pain rating scale, medication(s)/side effects and non-pharmacologic comfort measures Outcome: Not Progressing   Problem: Health Behavior/Discharge Planning: Goal: Ability to manage health-related needs will improve Outcome: Not Progressing   Problem: Clinical Measurements: Goal: Ability to maintain clinical measurements within normal limits will improve Outcome: Not Progressing Goal: Respiratory complications will improve Outcome: Not Progressing Goal: Cardiovascular complication will be avoided Outcome: Not Progressing   Problem: Activity: Goal: Risk for activity intolerance will decrease Outcome: Not Progressing   Problem: Nutrition: Goal: Adequate nutrition will be maintained Outcome: Not Progressing   Problem: Skin Integrity: Goal: Risk for impaired skin integrity will decrease Outcome: Not Progressing   Problem: Activity: Goal: Ability to tolerate increased activity will improve Outcome: Not Progressing   Problem:  Respiratory: Goal: Ability to maintain a clear airway and adequate ventilation will improve Outcome: Not Progressing   Problem: Role Relationship: Goal: Method of communication will improve Outcome: Not Progressing   Problem: Education: Goal: Ability to manage disease process will improve Outcome: Not Progressing   Problem: Cardiac: Goal: Ability to achieve and maintain adequate cardiopulmonary perfusion will improve Outcome: Not Progressing   Problem: Neurologic: Goal: Promote progressive neurologic recovery Outcome: Not Progressing   Problem: Education: Goal: Understanding of CV disease, CV risk reduction, and recovery process will improve Outcome: Not Progressing Goal: Individualized Educational Video(s) Outcome: Not Progressing   Problem: Activity: Goal: Ability to return to baseline activity level will improve Outcome: Not Progressing   Problem: Cardiovascular: Goal: Ability to achieve and maintain adequate cardiovascular perfusion will improve Outcome: Not Progressing   Problem: Health Behavior/Discharge Planning: Goal: Ability to safely manage health-related needs after discharge will improve Outcome: Not Progressing

## 2020-02-28 NOTE — ED Notes (Signed)
To cath lab.

## 2020-02-28 NOTE — Progress Notes (Signed)
LTM EEG hooked up and running - no initial skin breakdown - push button tested - neuro notified.  

## 2020-02-28 NOTE — Consult Note (Signed)
NAME:  Richard Hatfield, MRN:  [id number], DOB:  [birth year], LOS: 0 ADMISSION DATE:  02/28/2020, CONSULTATION DATE: 02/28/2020 referring MD: Cardiology, CHIEF COMPLAINT: Cardiac arrest  Brief History   Patient is a 64 year old status post cardiac arrest with resuscitated with 2  History of present illness   Patient is a 64 year old white male without known history of coronary disease who complained of chest pain earlier this evening his wife called 87 and attempted to administer CPR on the scene.  EMTs arrived to proceed CPR for about 10 minutes with 2 cardioversions and 2 epinephrine doses for ventricular tachycardia with return of ROSC.  He was reintubated in the emergency room and emergently taken to the Cath Lab.  He was found to have mild diffuse disease with vasospasm.  I am seeing him in the ICU.  On my evaluation the patient is off of sedation he says received sedation earlier in the evening I believe consistent with fentanyl and Versed.  He has brisk pupillary reflexes, sluggish corneal reflexes, sluggish oculogyric movement but does overbreathing the ventilator.  The nursing staff says he responded on arrival to deep sternal rub with raising his arms. Laboratories were relatively unremarkable, he has a slight elevation in his LFTs, his creatinine is 1.4.  Past Medical History  No known prior medical history.  Is a non-smoker and drinks occasionally  Significant Hospital Events   Cardiac catheterization 02/28/2020  Consults:  Cardiology PCCM  Procedures:  As above  Significant Diagnostic Tests:  As above  Micro Data:  NA  Antimicrobials:  NA  Interim history/subjective:  NA  Objective   Blood pressure 133/84, pulse 75, temperature 98.1 F (36.7 C), temperature source Oral, resp. rate (!) 22, height 5\' 8"  (1.727 m), weight 95.7 kg, SpO2 99 %.    Vent Mode: PRVC FiO2 (%):  [100 %] 100 % Set Rate:  [16 bmp] 16 bmp Vt Set:  [540 mL] 540 mL PEEP:  [5 cmH20] 5 cmH20 Plateau  Pressure:  [17 cmH20] 17 cmH20  No intake or output data in the 24 hours ending 02/28/20 0354 Filed Weights   02/28/20 0119 02/28/20 0315  Weight: 95.8 kg 95.7 kg    Examination: General: Moderately obese male intubated poorly responsive except to deep pain HENT: Unremarkable Lungs: Diminished bilaterally but clear Cardiovascular: Regular rate and rhythm Abdomen: Rotund benign Extremities: Within normal limits Neuro: As above GU: Normal  Resolved Hospital Problem list   NA  Assessment & Plan:  1.  Post cardiac arrest: We will cool patient to 36 degrees.  Monitor in ICU setting.  Cardiology started heparin.  2.  Respiratory failure requiring mechanical ventilation: We will assist with ventilatory management.  3.  Hyperglycemia by initial labs: We will place on sliding scale.    Best practice:  Diet: N.p.o. Pain/Anxiety/Delirium protocol (if indicated): Fentanyl VAP protocol (if indicated): Yes DVT prophylaxis: IV heparin GI prophylaxis: Pepcid Glucose control: We will monitor Mobility: Bedrest Code Status: Full Family Communication: Discussed with wife Disposition: To to heart for further therapy  Labs   CBC: Recent Labs  Lab 02/28/20 0126  WBC 12.4*  NEUTROABS 5.0  HGB 15.1  HCT 44.8  MCV 96.8  PLT 182    Basic Metabolic Panel: Recent Labs  Lab 02/28/20 0126  NA 137  K 3.8  CL 105  CO2 15*  GLUCOSE 268*  BUN 20  CREATININE 1.45*  CALCIUM 8.7*   GFR: Estimated Creatinine Clearance: 60 mL/min (A) (by C-G formula based on SCr  of 1.45 mg/dL (H)). Recent Labs  Lab 02/28/20 0126  WBC 12.4*    Liver Function Tests: Recent Labs  Lab 02/28/20 0126  AST 135*  ALT 100*  ALKPHOS 41  BILITOT 0.4  PROT 6.6  ALBUMIN 3.9   No results for input(s): LIPASE, AMYLASE in the last 168 hours. No results for input(s): AMMONIA in the last 168 hours.  ABG No results found for: PHART, PCO2ART, PO2ART, HCO3, TCO2, ACIDBASEDEF, O2SAT   Coagulation  Profile: Recent Labs  Lab 02/28/20 0126  INR 1.1    Cardiac Enzymes: No results for input(s): CKTOTAL, CKMB, CKMBINDEX, TROPONINI in the last 168 hours.  HbA1C: Hgb A1c MFr Bld  Date/Time Value Ref Range Status  02/28/2020 01:26 AM 8.2 (H) 4.8 - 5.6 % Final    Comment:    (NOTE) Pre diabetes:          5.7%-6.4%  Diabetes:              >6.4%  Glycemic control for   <7.0% adults with diabetes     CBG: Recent Labs  Lab 02/28/20 0322  GLUCAP 280*    Review of Systems:   Unable to obtain.  Wife says there were no antecedent symptoms.  Past Medical History  He,  has no past medical history on file.   Surgical History   Currently unavailable  Social History      Family History   His family history is not on file.   Allergies Not on File   Home Medications  Prior to Admission medications   Not on File     Critical care time: Over 35 minutes was spent bedside evaluation chart review critical care planning

## 2020-02-28 NOTE — Procedures (Signed)
Patient Name: Richard Hatfield  MRN: [id number]  Epilepsy Attending: Lora Havens  Referring Physician/Provider: Dr Laurelyn Sickle Date: 02/28/2020 Duration: 23.25 mins  Patient history: 64yo M s/p cardiac arrest on ttm. EEg to evaluate for seizure  Level of alertness: comatose  AEDs during EEG study: Propofol  Technical aspects: This EEG study was done with scalp electrodes positioned according to the 10-20 International system of electrode placement. Electrical activity was acquired at a sampling rate of 500Hz  and reviewed with a high frequency filter of 70Hz  and a low frequency filter of 1Hz . EEG data were recorded continuously and digitally stored.   Description: EEG showed continuous generalized low amplitude 3 to 6 Hz theta-delta slowing. EEg was reactive to tactile stimulation.  Hyperventilation and photic stimulation were not performed.     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 No seizures or epileptiform discharges were seen throughout the recording.   [name] [name]

## 2020-02-28 NOTE — ED Triage Notes (Signed)
Per EMS, pt from home, woke up was grabbing at his chest, did not say anything and fell to the floor.  Wife did compressions until EMS arrived, total of 20 minutes downtime, EMS did 12 of CPR, shocked X2 and 2 epis give, organized sinus rhythm.    186/112

## 2020-02-28 NOTE — H&P (Addendum)
Cardiology Admission History and Physical:   Patient ID: Richard Hatfield MRN: [id number]; DOB: 30-Apr-1956   Admission date: 02/28/2020  Primary Care Provider: No primary care provider on file. [facility] Cardiologist: No primary care provider on file.  [facility] Electrophysiologist:  None   Chief Complaint: cardiac arrest   Patient Profile:   87M with HTN, HLD and T2DM who presents s/p cardiac arrest as code STEMI.   History of Present Illness:   History limited to discussion with wife as patient is intubated s/p cardiac arrest. Richard Hatfield does not remember a lot of the details of the arrest but does recall that she and her husband to bed around 11 PM.  She was not sure if they were completely asleep but woke up with her husband having what she describes as upper body jerking along with impaired breathing.  She immediately called EMS who instructed her to perform bystander CPR.  He was never responsive from the time that she noticed that something was wrong until he had available respirations and eventually had no movement.  EMS arrived and performed CPR for approximately 12 minutes following 8 minutes of bystander CPR with eventual ROSC after giving 2 rounds of epi and 2 cardioversions VT.  On evaluation in the emergency department he had very mild ST changes however the Cath Lab was activated post arrest.  During my evaluation he was not sedated and was not responding to pain recommendations.  He reportedly responded on arrival to deep sternal rub by moving his arms.  Coronary evaluation revealed diffuse CAD with no significant thrombus burden.  No lesions were intervened on and the patient was transferred to the ICU for further management.  Past Medical History:  Diagnosis Date  . Diabetes mellitus without complication ([place])   . Hypertension     Medications Prior to Admission: Prior to Admission medications   Not on File    Allergies:   No Known Allergies  Social History:     Social History   Socioeconomic History  . Marital status: Married    Spouse name: Richard Hatfield  . Number of children: Not on file  . Years of education: Not on file  . Highest education level: Not on file  Occupational History  . Not on file  Tobacco Use  . Smoking status: Never Smoker  . Smokeless tobacco: Never Used  Substance and Sexual Activity  . Alcohol use: Not Currently  . Drug use: Never  . Sexual activity: Not on file  Other Topics Concern  . Not on file  Social History Narrative  . Not on file   Social Determinants of Health   Financial Resource Strain:   . Difficulty of Paying Living Expenses:   Food Insecurity:   . Worried About Charity fundraiser in the Last Year:   . Arboriculturist in the Last Year:   Transportation Needs:   . Film/video editor (Medical):   Marland Kitchen Lack of Transportation (Non-Medical):   Physical Activity:   . Days of Exercise per Week:   . Minutes of Exercise per Session:   Stress:   . Feeling of Stress :   Social Connections:   . Frequency of Communication with Friends and Family:   . Frequency of Social Gatherings with Friends and Family:   . Attends Religious Services:   . Active Member of Clubs or Organizations:   . Attends Archivist Meetings:   Marland Kitchen Marital Status:   Intimate Partner Violence:   .  Fear of Current or Ex-Partner:   . Emotionally Abused:   Marland Kitchen Physically Abused:   . Sexually Abused:     Family History: No family hx of premature CAD or SCD The patient's family history is not on file.    ROS:   Review of Systems: [y] = yes, [ ]  = no  Unable to assess ROS with patient intubated     General: Weight gain [ ] ; Weight loss [ ] ; Anorexia [ ] ; Fatigue [ ] ; Fever [ ] ; Chills [ ] ; Weakness [ ]     Cardiac: Chest pain/pressure [ ] ; Resting SOB [ ] ; Exertional SOB [ ] ; Orthopnea [ ] ; Pedal Edema [ ] ; Palpitations [ ] ; Syncope [ ] ; Presyncope [ ] ; Paroxysmal nocturnal dyspnea [ ]     Pulmonary: Cough [ ] ;  Wheezing [ ] ; Hemoptysis [ ] ; Sputum [ ] ; Snoring [ ]     GI: Vomiting [ ] ; Dysphagia [ ] ; Melena [ ] ; Hematochezia [ ] ; Heartburn [ ] ; Abdominal pain [ ] ; Constipation [ ] ; Diarrhea [ ] ; BRBPR [ ]     GU: Hematuria [ ] ; Dysuria [ ] ; Nocturia [ ]   Vascular: Pain in legs with walking [ ] ; Pain in feet with lying flat [ ] ; Non-healing sores [ ] ; Stroke [ ] ; TIA [ ] ; Slurred speech [ ] ;    Neuro: Headaches [ ] ; Vertigo [ ] ; Seizures [ ] ; Paresthesias [ ] ;Blurred vision [ ] ; Diplopia [ ] ; Vision changes [ ]     Ortho/Skin: Arthritis [ ] ; Joint pain [ ] ; Muscle pain [ ] ; Joint swelling [ ] ; Back Pain [ ] ; Rash [ ]     Psych: Depression [ ] ; Anxiety [ ]     Heme: Bleeding problems [ ] ; Clotting disorders [ ] ; Anemia [ ]     Endocrine: Diabetes [ ] ; Thyroid dysfunction [ ]    Physical Exam/Data:   Vitals:   02/28/20 0330 02/28/20 0430 02/28/20 0445 02/28/20 0500  BP: 132/88 120/68 127/68 134/65  Pulse: 76 77 73 72  Resp: (!) 21 (!) 23 (!) 21 (!) 23  Temp:  98.1 F (36.7 C)  98.2 F (36.8 C)  TempSrc:  Bladder    SpO2: 100% 100% 100% 100%  Weight:      Height:        Intake/Output Summary (Last 24 hours) at 02/28/2020 0516 Last data filed at 02/28/2020 0500 Gross per 24 hour  Intake 213.56 ml  Output 875 ml  Net -661.44 ml   Last 3 Weights 02/28/2020 02/28/2020  Weight (lbs) 210 lb 15.7 oz 211 lb 4.8 oz  Weight (kg) 95.7 kg 95.845 kg     Body mass index is 32.08 kg/m.  General:  Well nourished, well developed, intubated/sedated HEENT: normal Lymph: no adenopathy Neck: no JVD Endocrine:  No thryomegaly Vascular: No carotid bruits; FA pulses 2+ bilaterally without bruits  Cardiac:  normal S1, S2; RRR; no murmur  Lungs:  clear to auscultation bilaterally, no wheezing, rhonchi or rales  Abd: soft, nontender, no hepatomegaly  Ext: no LE edema Musculoskeletal:  No deformities, BUE and BLE strength normal and equal Skin: warm and dry  Neuro:  CNs 2-12 intact, no focal abnormalities  noted Psych:  Normal affect   EKG:  The ECG that was done showed minimal STE laterally  Relevant CV Studies:  Coronary angiography Result date: 02/28/20  Prox LAD to Mid LAD lesion is 60% stenosed.  1st Diag lesion is 75% stenosed.  Mid LAD lesion is 80% stenosed.  Prox Cx  lesion is 70% stenosed.  Prox Cx to Mid Cx lesion is 80% stenosed.  Prox RCA-1 lesion is 20% stenosed.  Prox RCA-2 lesion is 70% stenosed.  RV Branch lesion is 90% stenosed.  Dist RCA-1 lesion is 80% stenosed.  Dist RCA-2 lesion is 85% stenosed.  RPDA lesion is 30% stenosed.  The left ventricular ejection fraction is 45-50% by visual estimate.  LV end diastolic pressure is normal.  There is mild left ventricular systolic dysfunction.  Laboratory Data:  High Sensitivity Troponin:   Recent Labs  Lab 02/28/20 0126  TROPONINIHS 60*      Chemistry Recent Labs  Lab 02/28/20 0126 02/28/20 0506  NA 137 141  K 3.8 4.5  CL 105  --   CO2 15*  --   GLUCOSE 268*  --   BUN 20  --   CREATININE 1.45*  --   CALCIUM 8.7*  --   GFRNONAA 52*  --   GFRAA 60*  --   ANIONGAP 17*  --     Recent Labs  Lab 02/28/20 0126  PROT 6.6  ALBUMIN 3.9  AST 135*  ALT 100*  ALKPHOS 41  BILITOT 0.4   Hematology Recent Labs  Lab 02/28/20 0126 02/28/20 0506  WBC 12.4*  --   RBC 4.63  --   HGB 15.1 14.3  HCT 44.8 42.0  MCV 96.8  --   MCH 32.6  --   MCHC 33.7  --   RDW 11.7  --   PLT 211  --    BNPNo results for input(s): BNP, PROBNP in the last 168 hours.  DDimer No results for input(s): DDIMER in the last 168 hours.  Radiology/Studies:  CARDIAC CATHETERIZATION  Result Date: 02/28/2020  Prox LAD to Mid LAD lesion is 60% stenosed.  1st Diag lesion is 75% stenosed.  Mid LAD lesion is 80% stenosed.  Prox Cx lesion is 70% stenosed.  Prox Cx to Mid Cx lesion is 80% stenosed.  Prox RCA-1 lesion is 20% stenosed.  Prox RCA-2 lesion is 70% stenosed.  RV Branch lesion is 90% stenosed.  Dist RCA-1  lesion is 80% stenosed.  Dist RCA-2 lesion is 85% stenosed.  RPDA lesion is 30% stenosed.  The left ventricular ejection fraction is 45-50% by visual estimate.  LV end diastolic pressure is normal.  There is mild left ventricular systolic dysfunction.  Out of hospital witnessed VF cardiac arrest with return of ROSC after approximately 20 minutes of CPR and administration of 2 doses of epinephrine. Significant three-vessel CAD with 60% diffuse proximal LAD stenosis, long diffuse 70% diagonal stenosis and 80% LAD stenosis after the first diagonal vessel; 70 to 80% proximal diffuse circumflex stenosis before a large marginal branch; and very large dominant RCA with 70% proximal stenosis and long diffuse 80 and 85% stenoses beyond the acute margin proximal to the PDA takeoff with mild 30% narrowing in the PDA. Mild acute LV dysfunction with focal anterolateral hypocontractility and EF estimated 45 to 50%. LVEDP 12 mm RECOMMENDATION: Suspect transient coronary vasospasm involving the LAD circulation in the etiology of the patient's VF cardiac arrest. Low-dose IV nitroglycerin was started at the end of the catheterization procedure. Patient will be transported to to heart and evaluated by critical care with consideration for hypothermia due to witnessed cardiac arrest. A 2D echo Doppler study will be obtained. We will review angios with colleagues but with diffuse multivessel CAD in this diabetic male consider possible surgical consultation for CABG revascularization following stability.  DG Chest Portable 1 View  Result Date: 02/28/2020 CLINICAL DATA:  64 year old male intubated.  Cardiac arrest at home. EXAM: PORTABLE CHEST 1 VIEW COMPARISON:  None. FINDINGS: Portable AP supine view at 0134 hours. Endotracheal tube tip at the level the clavicles. Enteric tube courses into the left upper quadrant, tip not included. Mildly low lung volumes. Normal cardiac size and mediastinal contours. Allowing for portable  technique the lungs are clear. No acute osseous abnormality identified. Paucity of bowel gas. IMPRESSION: 1. Satisfactory placement of endotracheal tube and enteric tube. 2.  No acute cardiopulmonary abnormality. Electronically Signed   By: Genevie Ann M.D.   On: 02/28/2020 01:45   Assessment and Plan:   1. VT arrest Mr. Bommarito status post cardiac arrest and found to be in VT.  Coronary evaluation revealed diffuse obstructive CAD however no obvious acute thrombus as etiology for arrest.  He very well may have transiently occluded one of the multiple vascular territories and is less.  Other than hypertension and diabetes he has no significant coronary risk factors or medical history. - continue heparin gtt - continue ASA 81 mg PO daily - continue atorva 80 mg PO daily  - CT head pending, TTM to 36C once CT head result - TTE pending   2. HTN - continue prn labetalol, hydralazine - start ACEi one more stable  3. T2DM - continue SSI temporarily while NPO   4. Mechanical ventilation - continue pepcid 20 mg IV q12h   Severity of Illness: The appropriate patient status for this patient is INPATIENT. Inpatient status is judged to be reasonable and necessary in order to provide the required intensity of service to ensure the patient's safety. The patient's presenting symptoms, physical exam findings, and initial radiographic and laboratory data in the context of their chronic comorbidities is felt to place them at high risk for further clinical deterioration. Furthermore, it is not anticipated that the patient will be medically stable for discharge from the hospital within 2 midnights of admission. The following factors support the patient status of inpatient.   " The patient's presenting symptoms include cardiac arrest " The worrisome physical exam findings include cardiac arrest " The initial radiographic and laboratory data are worrisome because of n/a " The chronic co-morbidities include HTN,  DM2  * I certify that at the point of admission it is my clinical judgment that the patient will require inpatient hospital care spanning beyond 2 midnights from the point of admission due to high intensity of service, high risk for further deterioration and high frequency of surveillance required.*   For questions or updates, please contact [place] Please consult [email or web address] for contact info under   Signed, Dion Body, MD  02/28/2020 5:16 AM   Patient seen and examined. Agree with assessment and plan.  Patient seen her shortly after arrival by EMS.  Patient has a history of hypertension, hyperlipidemia, and diabetes mellitus who presented after developing an out of hospital cardiac arrest witnessed by his wife.  CPR was initiated promptly patient received approximately 20 minutes of combined CPR by wife and EMS.  He had return of spontaneous circulation after 2 rounds of epinephrine and 2 defibrillations.  The emergency room he has been intubated.  Presently hemodynamics are stable with blood pressure 130/80 and regular rhythm.  ECG does not demonstrate diagnostic criteria for STEMI but there is minimal ST elevation anteroseptally.  I have discussed the situation with the patient's family and have discussed emergent cardiac catheterization to find  his coronary anatomy.  Suspect VF arrest contributed by probable transient coronary spasm on top of fixed CAD with reperfusion.  Catheterization was performed and revealed significant multivessel CAD involving a large dominant RCA, circumflex, and large proximal to mid LAD and diagonal diffuse stenoses.  Wall motion suggests hypocontractility in the anterolateral wall most likely due to transient LAD territory spasm.  Have discussed with critical care medicine who will initiate hypothermia.  Consider subsequent surgical consultation for possible CABG revascularization surgery following stabilization and further evaluation.  Troy Sine,  MD, [facility] 02/28/2020 8:12 AM

## 2020-02-28 NOTE — ED Notes (Signed)
Provider informed pt is biting tong, ordered 50mg  propofol. Given

## 2020-02-28 NOTE — Progress Notes (Signed)
  Echocardiogram 2D Echocardiogram has been performed.  Richard Hatfield 02/28/2020, 3:48 PM

## 2020-02-29 ENCOUNTER — Inpatient Hospital Stay (HOSPITAL_COMMUNITY): Payer: BC Managed Care – PPO

## 2020-02-29 LAB — BASIC METABOLIC PANEL
Anion gap: 10 (ref 5–15)
BUN: 21 mg/dL (ref 8–23)
CO2: 24 mmol/L (ref 22–32)
Calcium: 8.1 mg/dL — ABNORMAL LOW (ref 8.9–10.3)
Chloride: 109 mmol/L (ref 98–111)
Creatinine, Ser: 1.08 mg/dL (ref 0.61–1.24)
GFR calc Af Amer: >60 mL/min (ref >60)
GFR calc non Af Amer: >60 mL/min (ref >60)
Glucose, Bld: 184 mg/dL — ABNORMAL HIGH (ref 70–99)
Potassium: 5.3 mmol/L — ABNORMAL HIGH (ref 3.5–5.1)
Sodium: 143 mmol/L (ref 135–145)

## 2020-02-29 LAB — MAGNESIUM: Magnesium: 2.3 mg/dL (ref 1.7–2.4)

## 2020-02-29 LAB — HEPATIC FUNCTION PANEL
ALT: 86 U/L — ABNORMAL HIGH (ref 0–44)
AST: 80 U/L — ABNORMAL HIGH (ref 15–41)
Albumin: 3.7 g/dL (ref 3.5–5.0)
Alkaline Phosphatase: 27 U/L — ABNORMAL LOW (ref 38–126)
Bilirubin, Direct: <0.1 mg/dL (ref 0.0–0.2)
Total Bilirubin: 0.5 mg/dL (ref 0.3–1.2)
Total Protein: 6.6 g/dL (ref 6.5–8.1)

## 2020-02-29 LAB — CBC
HCT: 41.9 % (ref 39.0–52.0)
Hemoglobin: 13.4 g/dL (ref 13.0–17.0)
MCH: 32.4 pg (ref 26.0–34.0)
MCHC: 32 g/dL (ref 30.0–36.0)
MCV: 101.2 fL — ABNORMAL HIGH (ref 80.0–100.0)
Platelets: 164 10*3/uL (ref 150–400)
RBC: 4.14 MIL/uL — ABNORMAL LOW (ref 4.22–5.81)
RDW: 12 % (ref 11.5–15.5)
WBC: 14 10*3/uL — ABNORMAL HIGH (ref 4.0–10.5)
nRBC: 0 % (ref 0.0–0.2)

## 2020-02-29 LAB — GLUCOSE, CAPILLARY
Glucose-Capillary: 103 mg/dL — ABNORMAL HIGH (ref 70–99)
Glucose-Capillary: 115 mg/dL — ABNORMAL HIGH (ref 70–99)
Glucose-Capillary: 132 mg/dL — ABNORMAL HIGH (ref 70–99)
Glucose-Capillary: 146 mg/dL — ABNORMAL HIGH (ref 70–99)
Glucose-Capillary: 155 mg/dL — ABNORMAL HIGH (ref 70–99)
Glucose-Capillary: 157 mg/dL — ABNORMAL HIGH (ref 70–99)
Glucose-Capillary: 95 mg/dL (ref 70–99)

## 2020-02-29 LAB — HEPARIN LEVEL (UNFRACTIONATED)
Heparin Unfractionated: 0.16 IU/mL — ABNORMAL LOW (ref 0.30–0.70)
Heparin Unfractionated: 0.33 IU/mL (ref 0.30–0.70)

## 2020-02-29 LAB — PHOSPHORUS: Phosphorus: 5.3 mg/dL — ABNORMAL HIGH (ref 2.5–4.6)

## 2020-02-29 LAB — TRIGLYCERIDES: Triglycerides: 620 mg/dL — ABNORMAL HIGH (ref <150)

## 2020-02-29 MED ORDER — FAMOTIDINE 40 MG/5ML PO SUSR
20.0000 mg | Freq: Two times a day (BID) | ORAL | Status: DC
  Administered 2020-02-29 – 2020-03-01 (×3): 20 mg
  Filled 2020-02-29 (×3): qty 2.5

## 2020-02-29 MED ORDER — SODIUM ZIRCONIUM CYCLOSILICATE 10 G PO PACK
10.0000 g | PACK | Freq: Two times a day (BID) | ORAL | Status: AC
  Administered 2020-02-29 (×2): 10 g
  Filled 2020-02-29 (×2): qty 1

## 2020-02-29 MED ORDER — FREE WATER
200.0000 mL | Freq: Four times a day (QID) | Status: DC
  Administered 2020-02-29 – 2020-03-01 (×5): 200 mL

## 2020-02-29 MED ORDER — FUROSEMIDE 10 MG/ML IJ SOLN: 20.0000 mg | Freq: Once | INTRAMUSCULAR | Status: DC

## 2020-02-29 MED ORDER — SODIUM CHLORIDE 0.9 % IV SOLN
2.0000 g | INTRAVENOUS | Status: AC
  Administered 2020-02-29 – 2020-03-04 (×5): 2 g via INTRAVENOUS
  Filled 2020-02-29 (×4): qty 2
  Filled 2020-02-29 (×2): qty 20

## 2020-02-29 MED ORDER — DEXTROSE 5 % IV SOLN
500.0000 mg | Freq: Once | INTRAVENOUS | Status: AC
  Administered 2020-02-29: 500 mg via INTRAVENOUS
  Filled 2020-02-29: qty 500

## 2020-02-29 MED ORDER — SENNOSIDES-DOCUSATE SODIUM 8.6-50 MG PO TABS
1.0000 | ORAL_TABLET | Freq: Two times a day (BID) | ORAL | Status: DC
  Administered 2020-02-29 (×2): 1
  Filled 2020-02-29 (×3): qty 1

## 2020-02-29 MED ORDER — POLYETHYLENE GLYCOL 3350 17 G PO PACK
17.0000 g | PACK | Freq: Every day | ORAL | Status: DC
  Administered 2020-02-29 – 2020-03-01 (×2): 17 g
  Filled 2020-02-29 (×2): qty 1

## 2020-02-29 MED FILL — Heparin Sod (Porcine)-NaCl IV Soln 1000 Unit/500ML-0.9%: INTRAVENOUS | Qty: 1000 | Status: AC

## 2020-02-29 NOTE — Progress Notes (Signed)
NAME:  Richard Hatfield, MRN:  [id number], DOB:  [birth year], LOS: 1 ADMISSION DATE:  02/28/2020, CONSULTATION DATE: 02/28/2020 referring MD: Cardiology, CHIEF COMPLAINT: Cardiac arrest  Brief History   Patient is a 64 year old status post cardiac arrest with resuscitated with 2  History of present illness   Patient is a 64 year old white male without known history of coronary disease who complained of chest pain earlier this evening his wife called 51 and attempted to administer CPR on the scene.  EMTs arrived to proceed CPR for about 10 minutes with 2 cardioversions and 2 epinephrine doses for ventricular tachycardia with return of ROSC.  He was reintubated in the emergency room and emergently taken to the Cath Lab.  He was found to have mild diffuse disease with vasospasm.  I am seeing him in the ICU.  On my evaluation the patient is off of sedation he says received sedation earlier in the evening I believe consistent with fentanyl and Versed.  He has brisk pupillary reflexes, sluggish corneal reflexes, sluggish oculogyric movement but does overbreathing the ventilator.  The nursing staff says he responded on arrival to deep sternal rub with raising his arms. Laboratories were relatively unremarkable, he has a slight elevation in his LFTs, his creatinine is 1.4.  Past Medical History  No known prior medical history.  Is a non-smoker and drinks occasionally  Significant Hospital Events   Cardiac catheterization 02/28/2020  Consults:  Cardiology PCCM  Procedures:  As above  Significant Diagnostic Tests:  7/13 echocardiogram>>LVEF 50-55%--no regional wall abn. Normal RV size and function. Mildly elevated PASP. Mild LA dilation. No valvular abnormalities.  Micro Data:  NA  Antimicrobials:  NA  Interim history/subjective:  Rewarming now  Objective   Blood pressure 97/83, pulse 77, temperature (!) 97.3 F (36.3 C), temperature source Bladder, resp. rate 12, height 5\' 8"  (6.789 m), weight  95.7 kg, SpO2 100 %.    Vent Mode: PRVC FiO2 (%):  [40 %] 40 % Set Rate:  [16 bmp] 16 bmp Vt Set:  [540 mL] 540 mL PEEP:  [5 cmH20] 5 cmH20 Plateau Pressure:  [13 cmH20] 13 cmH20   Intake/Output Summary (Last 24 hours) at 02/29/2020 0939 Last data filed at 02/29/2020 0700 Gross per 24 hour  Intake 5156.94 ml  Output 3495 ml  Net 1661.94 ml   Filed Weights   02/28/20 0119 02/28/20 0315 02/29/20 0500  Weight: 95.8 kg 95.7 kg 95.7 kg    Examination: General: critically ill appearing male HENT: Unremarkable Lungs: upper respiratory sounds appreciable. On MV. Cardiovascular: Regular rate and rhythm. No LE edema Abdomen: nondistended. Firm. No apparent pain on palpation. Extremities: Within normal limits Neuro: sedated on fentanyl and propofol. RAAS -4. Sluggish pupillary response.  GU: Normal  Resolved Hospital Problem list   NA  Assessment & Plan:   Vfib cardiac arrest s/p normothermic TTM--now rewarming.  LHC showing 3 vessel disease involving LAD, circumflex, RCA. Echo only showing mildly reduced EF of 50-55% without any regional wall abnormalities.  Mild transaminitis 2/2 above. Renal function preserved at this time.  Plan: management per cardiology. Will likely need CABG but will wait to evaluate for neurologic recovery prior to further workup.   Acute hypoxic respiratory requiring mechanical ventilation.  CXR this morning concerning for increased bilateral infiltrates > in LLL. Concerning for aspiration.  Plan: continue MV. Will start empiric coverage with rocephin. 1x dose of 20mg  IV lasix. VAP bundle.  Critical illness requiring sedation. Currently on 76mcg propofol and 338mcg fentanyl. Triglycerides are  elevated--will work on weaning off propofol and weaning down fentanyl for better neuroprognostication.   Hyperglycemia. Continue SSI.   Hyperkalemia. K 5.3. Give 2 doses of lokelma. Recheck K in 4h.  Best practice:  Diet: TF Pain/Anxiety/Delirium protocol (if  indicated): Fentanyl, propofol VAP protocol (if indicated): Yes DVT prophylaxis: heparin gtt GI prophylaxis: Pepcid Glucose control: SSI Mobility: Bedrest Code Status: Full Family Communication: Discussed with wife Disposition: ICU  Mitzi Hansen, MD Internal Medicine Resident PGY 2 02/29/20 10:26 AM   CBC Latest Ref Rng & Units 02/29/2020 02/28/2020 02/28/2020  WBC 4.0 - 10.5 K/uL 14.0(H) 12.7(H) -  Hemoglobin 13.0 - 17.0 g/dL 13.4 13.5 14.3  Hematocrit 39 - 52 % 41.9 40.5 42.0  Platelets 150 - 400 K/uL 164 173 -   BMP Latest Ref Rng & Units 02/29/2020 02/28/2020 02/28/2020  Glucose 70 - 99 mg/dL 184(H) 179(H) 217(H)  BUN 8 - 23 mg/dL 21 18 25(H)  Creatinine 0.61 - 1.24 mg/dL 1.08 1.10 1.29(H)  Sodium 135 - 145 mmol/L 143 144 139  Potassium 3.5 - 5.1 mmol/L 5.3(H) 5.9(H) 4.5  Chloride 98 - 111 mmol/L 109 110 108  CO2 22 - 32 mmol/L 24 19(L) 20(L)  Calcium 8.9 - 10.3 mg/dL 8.1(L) 8.2(L) 8.3(L)

## 2020-02-29 NOTE — Progress Notes (Signed)
ANTICOAGULATION CONSULT NOTE  Pharmacy Consult for heparin Indication: CAD awaiting CVTS consult  No Known Allergies  Patient Measurements: Height: 5\' 8"  (172.7 cm) Weight: 95.7 kg (210 lb 15.7 oz) (with TTM pads on) IBW/kg (Calculated) : 68.4 Heparin Dosing Weight: 90kg  Vital Signs: Temp: 99 F (37.2 C) (07/14 1141) Temp Source: Oral (07/14 1141) BP: 165/84 (07/14 1124) Pulse Rate: 79 (07/14 1124)  Labs: Recent Labs    02/28/20 0126 02/28/20 0223 02/28/20 0506 02/28/20 0506 02/28/20 0543 02/28/20 1609 02/28/20 2006 02/29/20 0056  HGB 15.1   < > 14.3   < > 13.5  --   --  13.4  HCT 44.8   < > 42.0  --  40.5  --   --  41.9  PLT 211  --   --   --  173  --   --  164  APTT 27  --   --   --  25  --  39*  --   LABPROT 13.8  --   --   --  14.8  --  13.9  --   INR 1.1  --   --   --  1.2  --  1.1  --   HEPARINUNFRC  --   --   --   --   --  <0.10*  --  0.16*  CREATININE 1.45*   < >  --    < > 1.29*  --  1.10 1.08  TROPONINIHS 60*  --   --   --  992*  --   --   --    < > = values in this interval not displayed.    Estimated Creatinine Clearance: 80.6 mL/min (by C-G formula based on SCr of 1.08 mg/dL).   Assessment: 64yo male had witnessed VF arrest, called as code STEMI and sent emergently to cath lab which revealed diffuse multivessel CAD >> awaiting CVTS consult.  Heparin resumed post-cath.  Heparin level on lower end of therapeutic (0.33) after increasing drip rate to 1500 units/hr. Level was drawn 5 hours late. CBC stable and wnl. No overt bleeding or infusion issues per RN. Will increase drip rate slightly to maintain therapeutic level and check HL with AM labs.  Goal of Therapy:  Heparin level 0.3-0.7 units/ml Monitor platelets by anticoagulation protocol: Yes   Plan:  Increase heparin infusion to 1600 units/hr Monitor daily HL, CBC, s/sx bleeding  Richardine Service, PharmD PGY2 Cardiology Pharmacy Resident Phone: [phone] 02/29/2020  1:46 PM  Please check  [email or web address] for unit-specific pharmacy phone numbers.

## 2020-02-29 NOTE — Progress Notes (Signed)
ANTICOAGULATION CONSULT NOTE - Follow Up Consult  Pharmacy Consult for heparin Indication: CAD awaiting CVTS consult  Labs: Recent Labs    02/28/20 0126 02/28/20 0126 02/28/20 0223 02/28/20 0223 02/28/20 0506 02/28/20 0506 02/28/20 0543 02/28/20 1609 02/28/20 2006 02/29/20 0056  HGB 15.1   < > 15.0   < > 14.3   < > 13.5  --   --  13.4  HCT 44.8   < > 44.0   < > 42.0  --  40.5  --   --  41.9  PLT 211  --   --   --   --   --  173  --   --  164  APTT 27  --   --   --   --   --  25  --  39*  --   LABPROT 13.8  --   --   --   --   --  14.8  --  13.9  --   INR 1.1  --   --   --   --   --  1.2  --  1.1  --   HEPARINUNFRC  --   --   --   --   --   --   --  <0.10*  --  0.16*  CREATININE 1.45*   < > 1.20  --   --   --  1.29*  --  1.10  --   TROPONINIHS 60*  --   --   --   --   --  992*  --   --   --    < > = values in this interval not displayed.    Assessment: 64yo male remains subtherapeutic on heparin after rate change though level is rising; no gtt issues or signs of bleeding per RN.  Goal of Therapy:  Heparin level 0.3-0.7 units/ml   Plan:  Will increase heparin gtt by 3 units/kg/hr to 1500 units/hr and check level in 6 hours.    Wynona Neat, PharmD, BCPS  02/29/2020,1:35 AM

## 2020-02-29 NOTE — Procedures (Addendum)
Patient Name: Richard Hatfield  MRN: [id number]  Epilepsy Attending: Lora Havens  Referring Physician/Provider: Dr Laurelyn Sickle Duration: 02/28/2020 1012 to 02/29/2020 1012  Patient history: 64yo M s/p cardiac arrest on ttm. EEg to evaluate for seizure  Level of alertness: comatose  AEDs during EEG study: Propofol  Technical aspects: This EEG study was done with scalp electrodes positioned according to the 10-20 International system of electrode placement. Electrical activity was acquired at a sampling rate of 500Hz  and reviewed with a high frequency filter of 70Hz  and a low frequency filter of 1Hz . EEG data were recorded continuously and digitally stored.   Description: EEG showed continuous generalized low amplitude 3 to 6 Hz theta-delta slowing. EEg was reactive to tactile stimulation.  Hyperventilation and photic stimulation were not performed.     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 No seizures or epileptiform discharges were seen throughout the recording.   Richard Hatfield

## 2020-02-29 NOTE — Progress Notes (Signed)
LTM maintenance completed; reprepped under Cz, Fp1, and Fp2; no skin breakdown was seen.

## 2020-02-29 NOTE — Progress Notes (Signed)
eLink Physician-Brief Progress Note Patient Name: Richard Hatfield DOB: [birth year] MRN: [id number]   Date of Service  02/29/2020  HPI/Events of Note  Nurse concerned about aspiration of tube feeds.   eICU Interventions  Plan: 1. Portable CXR STAT.     Intervention Category Major Interventions: Other:  Lysle Dingwall 02/29/2020, 5:04 AM

## 2020-02-29 NOTE — Hospital Course (Signed)
Hypertriglyceridemia--d/c prop Hyperkalemia--does K change much with normothermic protocol--5.3 Hyperglycemia--hold off on insulin for now to avoid drop

## 2020-02-29 NOTE — Progress Notes (Signed)
Progress Note  Patient Name: Richard Hatfield Date of Encounter: 02/29/2020  [place] [facility] Cardiologist:  [place]   Pt sedated and intubated.   Inpatient Medications    Scheduled Meds: . aspirin  81 mg Per Tube Daily  . atorvastatin  80 mg Per Tube Daily  . chlorhexidine gluconate (MEDLINE KIT)  15 mL Mouth Rinse BID  . Chlorhexidine Gluconate Cloth  6 each Topical Daily  . feeding supplement (PROSource TF)  45 mL Per Tube TID  . insulin aspart  0-9 Units Subcutaneous Q4H  . mouth rinse  15 mL Mouth Rinse 10 times per day  . sodium chloride flush  3 mL Intravenous Q12H   Continuous Infusions: . sodium chloride 10 mL/hr at 02/28/20 0144  . sodium chloride    . sodium chloride 125 mL/hr at 02/29/20 0700  . famotidine (PEPCID) IV Stopped (02/28/20 2235)  . feeding supplement (VITAL AF 1.2 CAL) 60 mL/hr at 02/28/20 1700  . fentaNYL infusion INTRAVENOUS 325 mcg/hr (02/29/20 0724)  . heparin 1,500 Units/hr (02/29/20 0721)  . nitroGLYCERIN Stopped (02/28/20 0631)  . norepinephrine (LEVOPHED) Adult infusion    . propofol (DIPRIVAN) infusion 35 mcg/kg/min (02/29/20 0721)   PRN Meds: sodium chloride, acetaminophen (TYLENOL) oral liquid 160 mg/5 mL, ondansetron (ZOFRAN) IV, sodium chloride flush   Vital Signs    Vitals:   02/29/20 0700 02/29/20 0756 02/29/20 0800 02/29/20 0817  BP: 108/73  (!) 107/53 97/83  Pulse: 76  77 79  Resp: _0 Temp: (!) 97.5 F (36.4 C) (!) 97.5 F (36.4 C)  (!) 97.3 F (36.3 C)  TempSrc:  Oral  Bladder  SpO2: 100%  100% 100%  Weight:      Height:        Intake/Output Summary (Last 24 hours) at 02/29/2020 0821 Last data filed at 02/29/2020 0700 Gross per 24 hour  Intake 5156.94 ml  Output 3495 ml  Net 1661.94 ml   Last 3 Weights 02/29/2020 02/28/2020 02/28/2020  Weight (lbs) 210 lb 15.7 oz 210 lb 15.7 oz 211 lb 4.8 oz  Weight (kg) 95.7 kg 95.7 kg 95.845 kg      Telemetry    Sinus - Personally Reviewed  ECG    No AM  EKG - Personally Reviewed  Physical Exam   GEN: sedated, intubated Neck: No JVD Cardiac: RRR, no murmurs, rubs, or gallops.  Respiratory: Clear to auscultation bilaterally. GI: Soft, nontender, non-distended  MS: No edema; No deformity. Neuro: sedated Psych: sedated  Labs    High Sensitivity Troponin:   Recent Labs  Lab 02/28/20 0126 02/28/20 0543  TROPONINIHS 60* 992*      Chemistry Recent Labs  Lab 02/28/20 0126 02/28/20 0223 02/28/20 0543 02/28/20 2006 02/29/20 0056  NA 137   < > 139 144 143  K 3.8   < > 4.5 5.9* 5.3*  CL 105   < > 108 110 109  CO2 15*   < > 20* 19* 24  GLUCOSE 268*   < > 217* 179* 184*  BUN 20   < > 25* 18 21  CREATININE 1.45*   < > 1.29* 1.10 1.08  CALCIUM 8.7*   < > 8.3* 8.2* 8.1*  PROT 6.6  --   --   --  6.6  ALBUMIN 3.9  --   --   --  3.7  AST 135*  --   --   --  80*  ALT 100*  --   --   --  86*  ALKPHOS 41  --   --   --  27*  BILITOT 0.4  --   --   --  0.5  GFRNONAA 52*   < > 59* >60 >60  GFRAA 60*   < > >60 >60 >60  ANIONGAP 17*   < > _0 < > = values in this interval not displayed.     Hematology Recent Labs  Lab 02/28/20 0126 02/28/20 0223 02/28/20 0506 02/28/20 0543 02/29/20 0056  WBC 12.4*  --   --  12.7* 14.0*  RBC 4.63  --   --  4.20* 4.14*  HGB 15.1   < > 14.3 13.5 13.4  HCT 44.8   < > 42.0 40.5 41.9  MCV 96.8  --   --  96.4 101.2*  MCH 32.6  --   --  32.1 32.4  MCHC 33.7  --   --  33.3 32.0  RDW 11.7  --   --  11.7 12.0  PLT 211  --   --  173 164   < > = values in this interval not displayed.    BNPNo results for input(s): BNP, PROBNP in the last 168 hours.   DDimer No results for input(s): DDIMER in the last 168 hours.   Radiology    CT HEAD WO CONTRAST  Result Date: 02/28/2020 CLINICAL DATA:  History of cardiac arrest. EXAM: CT HEAD WITHOUT CONTRAST TECHNIQUE: Contiguous axial images were obtained from the base of the skull through the vertex without intravenous contrast. COMPARISON:  None.  FINDINGS: Brain: The ventricles are normal in size and configuration. No extra-axial fluid collections are identified. The gray-white differentiation is maintained. No CT findings for acute hemispheric infarction or intracranial hemorrhage. No mass lesions. The brainstem and cerebellum are normal. Vascular: Age advanced vascular calcifications are noted. No hyperdense vessels or obvious aneurysm. Skull: No acute skull fracture. No bone lesion. Sinuses/Orbits: The paranasal sinuses and mastoid air cells are clear. The globes are intact. Other: No scalp lesions, laceration or hematoma. IMPRESSION: Normal head CT. Electronically Signed   By: Marijo Sanes M.D.   On: 02/28/2020 06:10   CARDIAC CATHETERIZATION  Result Date: 02/28/2020  Prox LAD to Mid LAD lesion is 60% stenosed.  1st Diag lesion is 75% stenosed.  Mid LAD lesion is 80% stenosed.  Prox Cx lesion is 70% stenosed.  Prox Cx to Mid Cx lesion is 80% stenosed.  Prox RCA-1 lesion is 20% stenosed.  Prox RCA-2 lesion is 70% stenosed.  RV Branch lesion is 90% stenosed.  Dist RCA-1 lesion is 80% stenosed.  Dist RCA-2 lesion is 85% stenosed.  RPDA lesion is 30% stenosed.  The left ventricular ejection fraction is 45-50% by visual estimate.  LV end diastolic pressure is normal.  There is mild left ventricular systolic dysfunction.  Out of hospital witnessed VF cardiac arrest with return of ROSC after approximately 20 minutes of CPR and administration of 2 doses of epinephrine. Significant three-vessel CAD with 60% diffuse proximal LAD stenosis, long diffuse 70% diagonal stenosis and 80% LAD stenosis after the first diagonal vessel; 70 to 80% proximal diffuse circumflex stenosis before a large marginal branch; and very large dominant RCA with 70% proximal stenosis and long diffuse 80 and 85% stenoses beyond the acute margin proximal to the PDA takeoff with mild 30% narrowing in the PDA. Mild acute LV dysfunction with focal anterolateral  hypocontractility and EF estimated 45 to 50%. LVEDP 12 mm RECOMMENDATION: Suspect transient coronary  vasospasm involving the LAD circulation in the etiology of the patient's VF cardiac arrest. Low-dose IV nitroglycerin was started at the end of the catheterization procedure. Patient will be transported to to heart and evaluated by critical care with consideration for hypothermia due to witnessed cardiac arrest. A 2D echo Doppler study will be obtained. We will review angios with colleagues but with diffuse multivessel CAD in this diabetic male consider possible surgical consultation for CABG revascularization following stability.   DG CHEST PORT 1 VIEW  Result Date: 02/29/2020 CLINICAL DATA:  Code STEMI.  Aspiration EXAM: PORTABLE CHEST 1 VIEW COMPARISON:  Yesterday FINDINGS: Endotracheal tube tip at the clavicular heads. The enteric tube reaches the stomach. Low volume chest with indistinct and streaky density on both sides. Cardiomegaly and vascular pedicle widening accentuated by low volumes. No visible effusion or air leak IMPRESSION: 1. Unremarkable hardware positioning. 2. Increased infiltrates/atelectasis in the bilateral low volume lungs. Electronically Signed   By: Monte Fantasia M.D.   On: 02/29/2020 06:49   DG Chest Portable 1 View  Result Date: 02/28/2020 CLINICAL DATA:  64 year old male intubated.  Cardiac arrest at home. EXAM: PORTABLE CHEST 1 VIEW COMPARISON:  None. FINDINGS: Portable AP supine view at 0134 hours. Endotracheal tube tip at the level the clavicles. Enteric tube courses into the left upper quadrant, tip not included. Mildly low lung volumes. Normal cardiac size and mediastinal contours. Allowing for portable technique the lungs are clear. No acute osseous abnormality identified. Paucity of bowel gas. IMPRESSION: 1. Satisfactory placement of endotracheal tube and enteric tube. 2.  No acute cardiopulmonary abnormality. Electronically Signed   By: Genevie Ann M.D.   On: 02/28/2020  01:45   EEG adult  Result Date: 02/28/2020 Lora Havens, MD     02/28/2020 10:38 AM Patient Name: [place] Rabenold MRN: [id number] Epilepsy Attending: Lora Havens Referring Physician/Provider: Dr Laurelyn Sickle Date: 02/28/2020 Duration: 23.25 mins Patient history: 64yo M s/p cardiac arrest on ttm. EEg to evaluate for seizure Level of alertness: comatose AEDs during EEG study: Propofol Technical aspects: This EEG study was done with scalp electrodes positioned according to the 10-20 International system of electrode placement. Electrical activity was acquired at a sampling rate of _0  and reviewed with a high frequency filter of _1  and a low frequency filter of _2 . EEG data were recorded continuously and digitally stored. Description: EEG showed continuous generalized low amplitude 3 to 6 Hz theta-delta slowing. EEg was reactive to tactile stimulation.  Hyperventilation and photic stimulation were not performed.   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 No seizures or epileptiform discharges were seen throughout the recording. Lora Havens   ECHOCARDIOGRAM COMPLETE  Result Date: 02/28/2020    ECHOCARDIOGRAM REPORT   Patient Name:   EASTEN MACEACHERN Date of Exam: 02/28/2020 Medical Rec #:  [id number]     Height:       68.0 in Accession #:    [id number]    Weight:       211.0 lb Date of Birth:  23-Jun-1956     BSA:          2.091 m Patient Age:    81 years      BP:           127/67 mmHg Patient Gender: M             HR:  64 bpm. Exam Location:  Inpatient Procedure: 2D Echo, Cardiac Doppler and Color Doppler Indications:    Cardiac arrest  History:        Patient has no prior history of Echocardiogram examinations.                 Acute MI, Arrythmias:Cardiac Arrest; Risk Factors:Hypertension                 and Diabetes.  Sonographer:     Dustin Flock Referring Phys: Lyle Comments: Echo performed with patient supine and on artificial respirator. Image acquisition challenging due to uncooperative patient. IMPRESSIONS  1. Normal wall motion in visualized segments. Mid to distal anterolateral wall not well visualized on apical images. Consider repeat with echo contrast for wall motion when patient less agitated.. Left ventricular ejection fraction, by estimation, is 50  to 55%. The left ventricle has low normal function. The left ventricle has no regional wall motion abnormalities. There is mild concentric left ventricular hypertrophy. Left ventricular diastolic parameters were normal.  2. Right ventricular systolic function is normal. The right ventricular size is normal. There is mildly elevated pulmonary artery systolic pressure.  3. Left atrial size was mildly dilated.  4. The mitral valve is normal in structure. Trivial mitral valve regurgitation. No evidence of mitral stenosis.  5. The aortic valve has an indeterminant number of cusps. Aortic valve regurgitation is not visualized. Mild aortic valve stenosis. Comparison(s): No prior Echocardiogram. FINDINGS  Left Ventricle: Normal wall motion in visualized segments. Mid to distal anterolateral wall not well visualized on apical images. Consider repeat with echo contrast for wall motion when patient less agitated. Left ventricular ejection fraction, by estimation, is 50 to 55%. The left ventricle has low normal function. The left ventricle has no regional wall motion abnormalities. The left ventricular internal cavity size was normal in size. There is mild concentric left ventricular hypertrophy. Left ventricular diastolic parameters were normal. Right Ventricle: The right ventricular size is normal. No increase in right ventricular wall thickness. Right ventricular systolic function is normal. There is mildly elevated pulmonary artery systolic pressure. The  tricuspid regurgitant velocity is 2.94  m/s, and with an assumed right atrial pressure of 8 mmHg, the estimated right ventricular systolic pressure is 36.6 mmHg. Left Atrium: Left atrial size was mildly dilated. Right Atrium: Right atrial size was normal in size. Pericardium: There is no evidence of pericardial effusion. Presence of pericardial fat pad. Mitral Valve: The mitral valve is normal in structure. Trivial mitral valve regurgitation. No evidence of mitral valve stenosis. Tricuspid Valve: The tricuspid valve is normal in structure. Tricuspid valve regurgitation is trivial. No evidence of tricuspid stenosis. Aortic Valve: The aortic valve has an indeterminant number of cusps. Aortic valve regurgitation is not visualized. Mild aortic stenosis is present. There is moderate calcification of the aortic valve. Aortic valve mean gradient measures 8.0 mmHg. Aortic valve peak gradient measures 19.9 mmHg. Aortic valve area, by VTI measures 1.33 cm. Pulmonic Valve: The pulmonic valve was not well visualized. Pulmonic valve regurgitation is trivial. No evidence of pulmonic stenosis. Aorta: The aortic root is normal in size and structure. Venous: The inferior vena cava was not well visualized. IAS/Shunts: The atrial septum is grossly normal.  LEFT VENTRICLE PLAX 2D LVIDd:         4.30 cm  Diastology LVIDs:         3.10 cm  LV e' lateral:   8.81 cm/s LV PW:  1.40 cm  LV E/e' lateral: 8.8 LV IVS:        1.50 cm  LV e' medial:    7.83 cm/s LVOT diam:     2.00 cm  LV E/e' medial:  9.9 LV SV:         51 LV SV Index:   24 LVOT Area:     3.14 cm  RIGHT VENTRICLE RV Basal diam:  3.30 cm RV S prime:     12.90 cm/s TAPSE (M-mode): 3.9 cm LEFT ATRIUM             Index       RIGHT ATRIUM           Index LA diam:        4.10 cm 1.96 cm/m  RA Area:     14.80 cm LA Vol (A2C):   63.6 ml 30.42 ml/m RA Volume:   34.90 ml  16.69 ml/m LA Vol (A4C):   36.4 ml 17.41 ml/m LA Biplane Vol: 49.9 ml 23.87 ml/m  AORTIC VALVE AV Area  (Vmax):    1.27 cm AV Area (Vmean):   1.26 cm AV Area (VTI):     1.33 cm AV Vmax:           223.00 cm/s AV Vmean:          133.000 cm/s AV VTI:            0.380 m AV Peak Grad:      19.9 mmHg AV Mean Grad:      8.0 mmHg LVOT Vmax:         90.00 cm/s LVOT Vmean:        53.200 cm/s LVOT VTI:          0.161 m LVOT/AV VTI ratio: 0.42  AORTA Ao Root diam: 3.30 cm MITRAL VALVE               TRICUSPID VALVE MV Area (PHT): 4.19 cm    TR Peak grad:   34.6 mmHg MV Decel Time: 181 msec    TR Vmax:        294.00 cm/s MV E velocity: 77.60 cm/s MV A velocity: 68.60 cm/s  SHUNTS MV E/A ratio:  1.13        Systemic VTI:  0.16 m                            Systemic Diam: 2.00 cm Buford Dresser MD Electronically signed by Buford Dresser MD Signature Date/Time: 02/28/2020/10:28:34 PM    Final     Cardiac Studies   Echo 02/28/20:  1. Normal wall motion in visualized segments. Mid to distal anterolateral  wall not well visualized on apical images. Consider repeat with echo  contrast for wall motion when patient less agitated.. Left ventricular  ejection fraction, by estimation, is 50  to 55%. The left ventricle has low normal function. The left ventricle  has no regional wall motion abnormalities. There is mild concentric left  ventricular hypertrophy. Left ventricular diastolic parameters were  normal.  2. Right ventricular systolic function is normal. The right ventricular  size is normal. There is mildly elevated pulmonary artery systolic  pressure.  3. Left atrial size was mildly dilated.  4. The mitral valve is normal in structure. Trivial mitral valve  regurgitation. No evidence of mitral stenosis.  5. The aortic valve has an indeterminant number of cusps. Aortic valve  regurgitation is not visualized.  Mild aortic valve stenosis.   Patient Profile     64 y.o. male with out of hospital cardiac arrest. 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 No PCI performed.   Assessment &  Plan    1. Cardiac arrest with severe CAD: LV function preserved on echo yesterday. Pt being cooled. Will continue to follow and await neurological recovery before further cardiac workup.     For questions or updates, please contact [place] Please consult [email or web address] for contact info under        Signed, Lauree Chandler, MD  02/29/2020, 8:21 AM

## 2020-03-01 ENCOUNTER — Inpatient Hospital Stay (HOSPITAL_COMMUNITY): Payer: BC Managed Care – PPO

## 2020-03-01 DIAGNOSIS — I469 Cardiac arrest, cause unspecified: Secondary | ICD-10-CM | POA: Diagnosis not present

## 2020-03-01 LAB — CBC
HCT: 42 % (ref 39.0–52.0)
Hemoglobin: 13.7 g/dL (ref 13.0–17.0)
MCH: 33 pg (ref 26.0–34.0)
MCHC: 32.6 g/dL (ref 30.0–36.0)
MCV: 101.2 fL — ABNORMAL HIGH (ref 80.0–100.0)
Platelets: 181 10*3/uL (ref 150–400)
RBC: 4.15 MIL/uL — ABNORMAL LOW (ref 4.22–5.81)
RDW: 12.2 % (ref 11.5–15.5)
WBC: 11.1 10*3/uL — ABNORMAL HIGH (ref 4.0–10.5)
nRBC: 0 % (ref 0.0–0.2)

## 2020-03-01 LAB — BASIC METABOLIC PANEL
Anion gap: 11 (ref 5–15)
BUN: 24 mg/dL — ABNORMAL HIGH (ref 8–23)
CO2: 26 mmol/L (ref 22–32)
Calcium: 8.8 mg/dL — ABNORMAL LOW (ref 8.9–10.3)
Chloride: 108 mmol/L (ref 98–111)
Creatinine, Ser: 1.01 mg/dL (ref 0.61–1.24)
GFR calc Af Amer: >60 mL/min (ref >60)
GFR calc non Af Amer: >60 mL/min (ref >60)
Glucose, Bld: 139 mg/dL — ABNORMAL HIGH (ref 70–99)
Potassium: 5 mmol/L (ref 3.5–5.1)
Sodium: 145 mmol/L (ref 135–145)

## 2020-03-01 LAB — TRIGLYCERIDES: Triglycerides: 350 mg/dL — ABNORMAL HIGH (ref <150)

## 2020-03-01 LAB — GLUCOSE, CAPILLARY
Glucose-Capillary: 127 mg/dL — ABNORMAL HIGH (ref 70–99)
Glucose-Capillary: 135 mg/dL — ABNORMAL HIGH (ref 70–99)
Glucose-Capillary: 163 mg/dL — ABNORMAL HIGH (ref 70–99)
Glucose-Capillary: 128 mg/dL — ABNORMAL HIGH (ref 70–99)
Glucose-Capillary: 126 mg/dL — ABNORMAL HIGH (ref 70–99)

## 2020-03-01 LAB — HEPARIN LEVEL (UNFRACTIONATED): Heparin Unfractionated: 0.32 IU/mL (ref 0.30–0.70)

## 2020-03-01 LAB — MAGNESIUM: Magnesium: 2.8 mg/dL — ABNORMAL HIGH (ref 1.7–2.4)

## 2020-03-01 LAB — PHOSPHORUS: Phosphorus: 3.7 mg/dL (ref 2.5–4.6)

## 2020-03-01 MED ORDER — ASPIRIN 81 MG PO CHEW
81.0000 mg | CHEWABLE_TABLET | Freq: Every day | ORAL | Status: DC
  Administered 2020-03-02 – 2020-03-08 (×6): 81 mg via ORAL
  Filled 2020-03-01 (×6): qty 1

## 2020-03-01 MED ORDER — POLYETHYLENE GLYCOL 3350 17 G PO PACK
17.0000 g | PACK | Freq: Every day | ORAL | Status: DC
  Administered 2020-03-05 – 2020-03-06 (×2): 17 g via ORAL
  Filled 2020-03-01 (×3): qty 1

## 2020-03-01 MED ORDER — DEXMEDETOMIDINE HCL IN NACL 400 MCG/100ML IV SOLN: 0.0000 ug/kg/h | INTRAVENOUS | Status: DC

## 2020-03-01 MED ORDER — ATORVASTATIN CALCIUM 80 MG PO TABS
80.0000 mg | ORAL_TABLET | Freq: Every day | ORAL | Status: DC
  Administered 2020-03-02: 80 mg via ORAL
  Filled 2020-03-01: qty 1

## 2020-03-01 MED ORDER — FAMOTIDINE 20 MG PO TABS
20.0000 mg | ORAL_TABLET | Freq: Two times a day (BID) | ORAL | Status: DC
  Administered 2020-03-01 – 2020-03-02 (×2): 20 mg via ORAL
  Filled 2020-03-01 (×2): qty 1

## 2020-03-01 MED ORDER — ORAL CARE MOUTH RINSE
15.0000 mL | Freq: Two times a day (BID) | OROMUCOSAL | Status: DC
  Administered 2020-03-01 – 2020-03-08 (×10): 15 mL via OROMUCOSAL

## 2020-03-01 MED ORDER — SENNOSIDES-DOCUSATE SODIUM 8.6-50 MG PO TABS
1.0000 | ORAL_TABLET | Freq: Two times a day (BID) | ORAL | Status: DC
  Administered 2020-03-01: 1 via ORAL
  Filled 2020-03-01 (×2): qty 1

## 2020-03-01 MED ORDER — METOLAZONE 5 MG PO TABS
5.0000 mg | ORAL_TABLET | Freq: Once | ORAL | Status: AC
  Administered 2020-03-01: 5 mg
  Filled 2020-03-01: qty 1

## 2020-03-01 NOTE — Progress Notes (Signed)
vLTM EEG complete. No skin breakdown 

## 2020-03-01 NOTE — Plan of Care (Signed)
  Problem: Nutrition: Goal: Adequate nutrition will be maintained Outcome: Progressing   Problem: Elimination: Goal: Will not experience complications related to urinary retention Outcome: Progressing   Problem: Education: Goal: Ability to manage disease process will improve Outcome: Progressing   Problem: Skin Integrity: Goal: Risk for impaired skin integrity will be minimized. Outcome: Progressing   Problem: Cardiovascular: Goal: Vascular access site(s) Level 0-1 will be maintained Outcome: Progressing

## 2020-03-01 NOTE — Progress Notes (Signed)
NAME:  Richard Hatfield, MRN:  [id number], DOB:  04-Mar-1956, LOS: 2 ADMISSION DATE:  02/28/2020, CONSULTATION DATE: 02/28/2020 referring MD: Cardiology, CHIEF COMPLAINT: Cardiac arrest  Brief History   Patient is a 64 year old status post cardiac arrest with resuscitated with 2  History of present illness   Patient is a 64 year old white male without known history of coronary disease who complained of chest pain earlier this evening his wife called 23 and attempted to administer CPR on the scene.  EMTs arrived to proceed CPR for about 10 minutes with 2 cardioversions and 2 epinephrine doses for ventricular tachycardia with return of ROSC.  He was reintubated in the emergency room and emergently taken to the Cath Lab.  He was found to have mild diffuse disease with vasospasm.  I am seeing him in the ICU.  On my evaluation the patient is off of sedation he says received sedation earlier in the evening I believe consistent with fentanyl and Versed.  He has brisk pupillary reflexes, sluggish corneal reflexes, sluggish oculogyric movement but does overbreathing the ventilator.  The nursing staff says he responded on arrival to deep sternal rub with raising his arms. Laboratories were relatively unremarkable, he has a slight elevation in his LFTs, his creatinine is 1.4.  Past Medical History  No known prior medical history.  Is a non-smoker and drinks occasionally  Significant Hospital Events   Cardiac catheterization 02/28/2020  Consults:  Cardiology PCCM  Procedures:  As above  Significant Diagnostic Tests:  7/13 echocardiogram>>LVEF 50-55%--no regional wall abn. Normal RV size and function. Mildly elevated PASP. Mild LA dilation. No valvular abnormalities.  7/14 CXR>>increase b/l infiltrates/atelectasis  Micro Data:  NA  Antimicrobials:  NA  Interim history/subjective:  Wife updated at bedside yesterday afternoon. Paperwork filled out for patient's work per her request. We were able to  wean down sedation yesterday and he is starting to follow commands.  Objective   Blood pressure (!) 131/58, pulse 80, temperature 98.2 F (36.8 C), resp. rate 14, height 5\' 8"  (1.727 m), weight 94.8 kg, SpO2 99 %.    Vent Mode: PRVC FiO2 (%):  [40 %] 40 % Set Rate:  [16 bmp] 16 bmp Vt Set:  [540 mL] 540 mL PEEP:  [5 cmH20] 5 cmH20 Plateau Pressure:  [15 cmH20-19 cmH20] 18 cmH20   Intake/Output Summary (Last 24 hours) at 03/01/2020 0721 Last data filed at 03/01/2020 0700 Gross per 24 hour  Intake 3279.11 ml  Output 4005 ml  Net -725.89 ml   Filed Weights   02/28/20 0315 02/29/20 0500 03/01/20 0500  Weight: 95.7 kg 95.7 kg 94.8 kg    Examination: General: critically ill appearing male HENT: ETT Lungs: on MV PEEP 5, FiO2 40%. Rhonchi throughout Cardiovascular: Regular rate and rhythm. No LE edema extremities warm Abdomen: hypoactive bs Extremities: Within normal limits Neuro: awakens to voice. Does not track with eyes. Did not follow commands for me however did on Dr. Thompson Caul exam.  GU: foley  Resolved Hospital Problem list   AKI  Assessment & Plan:   OOH Vfib cardiac arrest s/p normothermic TTM--now rewarmed. LHC showing 3 vessel disease involving LAD, circumflex, RCA. Echo only showing mildly reduced EF of 50-55% without any regional wall abnormalities.  Mild transaminitis 2/2 above. Renal function preserved at this time.  Mental status improving with sedation wean Plan: management per cardiology. Will likely need CABG but will wait to evaluate for neurologic recovery prior to further workup. Continue heparin gtt  Acute hypoxic respiratory requiring mechanical  ventilation. On minimal vent settings this morning. CXR somewhat concerning for aspiration pna/pneumonitis. Plan: continue rocephin--day #2/5. Daily SBTs. VAP bundle. Repeat CXR  Critical illness requiring sedation. Will transition to precedex. D/c fentanyl and propofol. RAAS goal -1  Hyperglycemia. Glucoses  stable. Continue SSI.   Hyperkalemia. K 5.0 this morning. Continue to monitor.  High risk malnutrition. Continue tube feeds  Best practice:  Diet: TF Pain/Anxiety/Delirium protocol (if indicated): precedex VAP protocol (if indicated): Yes DVT prophylaxis: heparin gtt GI prophylaxis: Pepcid Glucose control: SSI Mobility: Bedrest Code Status: Full Family Communication: Discussed with wife Disposition: ICU  Mitzi Hansen, MD Internal Medicine Resident PGY 2 03/01/20 7:21 AM   CBC Latest Ref Rng & Units 03/01/2020 02/29/2020 02/28/2020  WBC 4.0 - 10.5 K/uL 11.1(H) 14.0(H) 12.7(H)  Hemoglobin 13.0 - 17.0 g/dL 13.7 13.4 13.5  Hematocrit 39 - 52 % 42.0 41.9 40.5  Platelets 150 - 400 K/uL 181 164 173   BMP Latest Ref Rng & Units 03/01/2020 02/29/2020 02/28/2020  Glucose 70 - 99 mg/dL 139(H) 184(H) 179(H)  BUN 8 - 23 mg/dL 24(H) 21 18  Creatinine 0.61 - 1.24 mg/dL 1.01 1.08 1.10  Sodium 135 - 145 mmol/L 145 143 144  Potassium 3.5 - 5.1 mmol/L 5.0 5.3(H) 5.9(H)  Chloride 98 - 111 mmol/L 108 109 110  CO2 22 - 32 mmol/L 26 24 19(L)  Calcium 8.9 - 10.3 mg/dL 8.8(L) 8.1(L) 8.2(L)

## 2020-03-01 NOTE — Procedures (Addendum)
Patient Name:Richard Hatfield LXB:[id number] Epilepsy Attending:Casimer Russett [name] Referring Physician/Provider:Dr Laurelyn Sickle Duration:02/29/2020 4163 to 03/01/2020 1035  Patient history:63yo M s/p cardiac arrest on ttm. EEg to evaluate for seizure  Level of alertness: awake  AEDs during EEG study:Propofol  Technical aspects: This EEG study was done with scalp electrodes positioned according to the 10-20 International system of electrode placement. Electrical activity was acquired at a sampling rate of 500Hz  and reviewed with a high frequency filter of 70Hz  and a low frequency filter of 1Hz . EEG data were recorded continuously and digitally stored.   Description: During awake state, no clear posterior dominant rhythm was seen.  EEG showed continuous generalizedlow amplitude5 to 6 Hz theta slowing.Hyperventilation and photic stimulation were not performed.   ABNORMALITY -Continuousslow, generalized  IMPRESSION: This study is suggestive of moderate diffuse encephalopathy, nonspecific etiology.No seizures or epileptiform discharges were seen throughout the recording.   Richard Hatfield [name]

## 2020-03-01 NOTE — Progress Notes (Signed)
[place] for heparin Indication: CAD awaiting CVTS consult  No Known Allergies  Patient Measurements: Height: 5\' 8"  (172.7 cm) Weight: 94.8 kg (208 lb 15.9 oz) IBW/kg (Calculated) : 68.4 Heparin Dosing Weight: 90kg  Vital Signs: Temp: 97.2 F (36.2 C) (07/15 0800) Temp Source: Bladder (07/15 0800) BP: 127/60 (07/15 0731) Pulse Rate: 77 (07/15 0731)  Labs: Recent Labs    02/28/20 0126 02/28/20 0223 02/28/20 0543 02/28/20 1609 02/28/20 2006 02/29/20 0056 02/29/20 1231 03/01/20 0558  HGB 15.1   < > 13.5  --   --  13.4  --  13.7  HCT 44.8   < > 40.5  --   --  41.9  --  42.0  PLT 211   < > 173  --   --  164  --  181  APTT 27  --  25  --  39*  --   --   --   LABPROT 13.8  --  14.8  --  13.9  --   --   --   INR 1.1  --  1.2  --  1.1  --   --   --   HEPARINUNFRC  --   --   --    < >  --  0.16* 0.33 0.32  CREATININE 1.45*   < > 1.29*  --  1.10 1.08  --  1.01  TROPONINIHS 60*  --  992*  --   --   --   --   --    < > = values in this interval not displayed.    Estimated Creatinine Clearance: 85.8 mL/min (by C-G formula based on SCr of 1.01 mg/dL).   Assessment: 64yo male had witnessed VF arrest, called as code STEMI and sent emergently to cath lab which revealed diffuse multivessel CAD >> awaiting CVTS consult.  Heparin resumed post-cath.  Heparin level on lower end of therapeutic (0.32) after increasing drip rate to 1600 units/hr. CBC stable and wnl. RN reports some bloody secretions, but no overt bleeding or infusion issues. Will increase drip rate slightly to maintain therapeutic level.  Goal of Therapy:  Heparin level 0.3-0.7 units/ml Monitor platelets by anticoagulation protocol: Yes   Plan:  Increase heparin infusion to 1650 units/hr Monitor daily HL, CBC, s/sx bleeding  Richardine Service, PharmD PGY2 Cardiology Pharmacy Resident Phone: [phone] 03/01/2020  8:58 AM  Please check [email or web address] for unit-specific pharmacy phone  numbers.

## 2020-03-01 NOTE — Procedures (Signed)
Extubation Procedure Note  Patient Details:   Name: Richard Hatfield DOB: [birth year] MRN: [id number]   Airway Documentation:    Vent end date: 03/01/20 Vent end time: 1145   Evaluation  O2 sats: stable throughout Complications: No apparent complications Patient did tolerate procedure well. Bilateral Breath Sounds: Diminished   Yes   Pt extubated to 4L [place]. Pt was able to speak and there was no stridor noted. Pt had no complications. RT will continue to monitor.   Tobi Bastos 03/01/2020, 11:49 AM

## 2020-03-02 LAB — PHOSPHORUS: Phosphorus: 2.4 mg/dL — ABNORMAL LOW (ref 2.5–4.6)

## 2020-03-02 LAB — CBC
HCT: 37.8 % — ABNORMAL LOW (ref 39.0–52.0)
Hemoglobin: 12.4 g/dL — ABNORMAL LOW (ref 13.0–17.0)
MCH: 31.8 pg (ref 26.0–34.0)
MCHC: 32.8 g/dL (ref 30.0–36.0)
MCV: 96.9 fL (ref 80.0–100.0)
Platelets: 183 10*3/uL (ref 150–400)
RBC: 3.9 MIL/uL — ABNORMAL LOW (ref 4.22–5.81)
RDW: 12 % (ref 11.5–15.5)
WBC: 8.8 10*3/uL (ref 4.0–10.5)
nRBC: 0 % (ref 0.0–0.2)

## 2020-03-02 LAB — HEPARIN LEVEL (UNFRACTIONATED): Heparin Unfractionated: 0.3 IU/mL (ref 0.30–0.70)

## 2020-03-02 LAB — GLUCOSE, CAPILLARY
Glucose-Capillary: 109 mg/dL — ABNORMAL HIGH (ref 70–99)
Glucose-Capillary: 110 mg/dL — ABNORMAL HIGH (ref 70–99)
Glucose-Capillary: 110 mg/dL — ABNORMAL HIGH (ref 70–99)
Glucose-Capillary: 118 mg/dL — ABNORMAL HIGH (ref 70–99)
Glucose-Capillary: 121 mg/dL — ABNORMAL HIGH (ref 70–99)
Glucose-Capillary: 125 mg/dL — ABNORMAL HIGH (ref 70–99)

## 2020-03-02 LAB — BASIC METABOLIC PANEL
Anion gap: 15 (ref 5–15)
BUN: 25 mg/dL — ABNORMAL HIGH (ref 8–23)
CO2: 29 mmol/L (ref 22–32)
Calcium: 9.7 mg/dL (ref 8.9–10.3)
Chloride: 103 mmol/L (ref 98–111)
Creatinine, Ser: 1.01 mg/dL (ref 0.61–1.24)
GFR calc Af Amer: >60 mL/min (ref >60)
GFR calc non Af Amer: >60 mL/min (ref >60)
Glucose, Bld: 109 mg/dL — ABNORMAL HIGH (ref 70–99)
Potassium: 3.5 mmol/L (ref 3.5–5.1)
Sodium: 147 mmol/L — ABNORMAL HIGH (ref 135–145)

## 2020-03-02 LAB — MAGNESIUM: Magnesium: 2.3 mg/dL (ref 1.7–2.4)

## 2020-03-02 LAB — TRIGLYCERIDES: Triglycerides: 158 mg/dL — ABNORMAL HIGH (ref <150)

## 2020-03-02 MED ORDER — LORAZEPAM 2 MG/ML IJ SOLN
1.0000 mg | INTRAMUSCULAR | Status: DC | PRN
  Administered 2020-03-02 – 2020-03-03 (×2): 2 mg via INTRAVENOUS
  Filled 2020-03-02 (×2): qty 1

## 2020-03-02 MED ORDER — POTASSIUM CHLORIDE 20 MEQ PO PACK
40.0000 meq | PACK | Freq: Once | ORAL | Status: DC
  Filled 2020-03-02: qty 2

## 2020-03-02 MED ORDER — POTASSIUM CHLORIDE 20 MEQ/15ML (10%) PO SOLN: 40.0000 meq | Freq: Once | ORAL | Status: DC

## 2020-03-02 MED ORDER — METOPROLOL TARTRATE 5 MG/5ML IV SOLN
2.5000 mg | Freq: Four times a day (QID) | INTRAVENOUS | Status: DC
  Administered 2020-03-02 – 2020-03-03 (×2): 2.5 mg via INTRAVENOUS
  Filled 2020-03-02 (×2): qty 5

## 2020-03-02 MED ORDER — LORAZEPAM 2 MG/ML IJ SOLN
1.0000 mg | Freq: Four times a day (QID) | INTRAMUSCULAR | Status: DC | PRN
  Administered 2020-03-02: 1 mg via INTRAVENOUS
  Filled 2020-03-02: qty 1

## 2020-03-02 MED ORDER — LORAZEPAM 2 MG/ML IJ SOLN: 1.0000 mg | Freq: Once | INTRAMUSCULAR | Status: AC

## 2020-03-02 MED ORDER — AMLODIPINE BESYLATE 10 MG PO TABS
10.0000 mg | ORAL_TABLET | Freq: Every day | ORAL | Status: DC
  Administered 2020-03-02: 10 mg via ORAL
  Filled 2020-03-02: qty 1

## 2020-03-02 MED ORDER — METOPROLOL TARTRATE 25 MG PO TABS
25.0000 mg | ORAL_TABLET | Freq: Two times a day (BID) | ORAL | Status: DC
  Administered 2020-03-02: 25 mg via ORAL
  Filled 2020-03-02: qty 1

## 2020-03-02 MED ORDER — HALOPERIDOL LACTATE 5 MG/ML IJ SOLN: 1.0000 mg | INTRAMUSCULAR | Status: DC | PRN

## 2020-03-02 MED ORDER — DIPHENHYDRAMINE HCL 50 MG/ML IJ SOLN: 25.0000 mg | Freq: Every evening | INTRAMUSCULAR | Status: DC | PRN

## 2020-03-02 MED ORDER — POTASSIUM CHLORIDE 10 MEQ/100ML IV SOLN
10.0000 meq | INTRAVENOUS | Status: AC
  Administered 2020-03-02 (×4): 10 meq via INTRAVENOUS
  Filled 2020-03-02 (×4): qty 100

## 2020-03-02 MED ORDER — FAMOTIDINE IN NACL 20-0.9 MG/50ML-% IV SOLN
20.0000 mg | Freq: Two times a day (BID) | INTRAVENOUS | Status: DC
  Administered 2020-03-03 – 2020-03-06 (×7): 20 mg via INTRAVENOUS
  Filled 2020-03-02 (×9): qty 50

## 2020-03-02 NOTE — Progress Notes (Signed)
ANTICOAGULATION CONSULT NOTE  Pharmacy Consult for heparin Indication: CAD awaiting CVTS consult  No Known Allergies  Patient Measurements: Height: 5\' 8"  (172.7 cm) Weight: 91.7 kg (202 lb 2.6 oz) IBW/kg (Calculated) : 68.4 Heparin Dosing Weight: 90kg  Vital Signs: Temp: 99.3 F (37.4 C) (07/16 0700) Temp Source: Core (07/16 0300) BP: 140/80 (07/16 0700) Pulse Rate: 76 (07/16 0700)  Labs: Recent Labs    02/28/20 1609 02/28/20 2006 02/29/20 0056 02/29/20 0056 02/29/20 1231 03/01/20 0558 03/02/20 0445  HGB  --   --  13.4   < >  --  13.7 12.4*  HCT  --   --  41.9  --   --  42.0 37.8*  PLT  --   --  164  --   --  181 183  APTT  --  39*  --   --   --   --   --   LABPROT  --  13.9  --   --   --   --   --   INR  --  1.1  --   --   --   --   --   HEPARINUNFRC   < >  --  0.16*   < > 0.33 0.32 0.30  CREATININE   < > 1.10 1.08  --   --  1.01 1.01   < > = values in this interval not displayed.    Estimated Creatinine Clearance: 84.4 mL/min (by C-G formula based on SCr of 1.01 mg/dL).   Assessment: 64yo male had witnessed VF arrest, called as code STEMI and sent emergently to cath lab which revealed diffuse multivessel CAD >> awaiting CVTS consult.  Heparin resumed post-cath.  Heparin level on lower end of therapeutic (0.30) after increasing drip rate to 1650 units/hr. CBC stable. No overt bleeding or infusion issues per RN. Will increase drip rate slightly to maintain therapeutic level.  Goal of Therapy:  Heparin level 0.3-0.7 units/ml Monitor platelets by anticoagulation protocol: Yes   Plan:  Increase heparin infusion to 1750 units/hr Monitor daily HL, CBC, s/sx bleeding  Richardine Service, PharmD PGY2 Cardiology Pharmacy Resident Phone: [phone] 03/02/2020  9:31 AM  Please check [email or web address] for unit-specific pharmacy phone numbers.

## 2020-03-02 NOTE — Progress Notes (Signed)
Notified [name] how patient had been doing. She asked if "he had calmed down any." I told her at the moment he is but it has been short lived. She did report that patient drinks 6 to 12 beers 3 times a week.

## 2020-03-02 NOTE — Progress Notes (Signed)
Progress Note  Patient Name: Richard Hatfield Date of Encounter: 03/02/2020  [facility] Cardiologist:  Claiborne Billings  Subjective   Awake. No chest pain.   Inpatient Medications    Scheduled Meds: . aspirin  81 mg Oral Daily  . atorvastatin  80 mg Oral Daily  . Chlorhexidine Gluconate Cloth  6 each Topical Daily  . famotidine  20 mg Oral BID  . feeding supplement (PROSource TF)  45 mL Per Tube TID  . free water  200 mL Per Tube Q6H  . insulin aspart  0-9 Units Subcutaneous Q4H  . mouth rinse  15 mL Mouth Rinse BID  . polyethylene glycol  17 g Oral Daily  . senna-docusate  1 tablet Oral BID  . sodium chloride flush  3 mL Intravenous Q12H   Continuous Infusions: . sodium chloride 10 mL/hr at 02/28/20 0144  . sodium chloride 10 mL/hr at 03/01/20 1018  . sodium chloride Stopped (02/29/20 1159)  . cefTRIAXone (ROCEPHIN)  IV Stopped (03/01/20 1050)  . dexmedetomidine (PRECEDEX) IV infusion    . feeding supplement (VITAL AF 1.2 CAL) Stopped (03/01/20 1100)  . heparin 1,650 Units/hr (03/01/20 2029)  . nitroGLYCERIN Stopped (02/28/20 0631)  . norepinephrine (LEVOPHED) Adult infusion     PRN Meds: sodium chloride, acetaminophen (TYLENOL) oral liquid 160 mg/5 mL, ondansetron (ZOFRAN) IV, sodium chloride flush   Vital Signs    Vitals:   03/02/20 0400 03/02/20 0500 03/02/20 0600 03/02/20 0700  BP:  (!) 173/91 (!) 163/75 140/80  Pulse: 88 89 75 76  Resp: (!) 23 17 17 18   Temp: 99.7 F (37.6 C) 99.5 F (37.5 C) 99.5 F (37.5 C) 99.3 F (37.4 C)  TempSrc:      SpO2: 94% 96% 93% 92%  Weight:  91.7 kg    Height:        Intake/Output Summary (Last 24 hours) at 03/02/2020 0752 Last data filed at 03/02/2020 0500 Gross per 24 hour  Intake 760.75 ml  Output 4282 ml  Net -3521.25 ml   Last 3 Weights 03/02/2020 03/01/2020 02/29/2020  Weight (lbs) 202 lb 2.6 oz 208 lb 15.9 oz 210 lb 15.7 oz  Weight (kg) 91.7 kg 94.8 kg 95.7 kg      Telemetry    Sinus, PVCs with short runs of VT -  Personally Reviewed  ECG    No AM EKG - Personally Reviewed  Physical Exam   General: Well developed, well nourished, NAD  HEENT: OP clear, mucus membranes moist  SKIN: warm, dry. No rashes. Neuro: No focal deficits  Musculoskeletal: Muscle strength 5/5 all ext  Psychiatric: Mood and affect normal  Neck: No JVD Lungs:Clear bilaterally, no wheezes, rhonci, crackles Cardiovascular: Regular rate and rhythm. No murmurs, gallops or rubs. Abdomen:Soft. Bowel sounds present. Non-tender.  Extremities: No lower extremity edema. Pulses are 2 + in the bilateral DP/PT.  Labs    High Sensitivity Troponin:   Recent Labs  Lab 02/28/20 0126 02/28/20 0543  TROPONINIHS 60* 992*      Chemistry Recent Labs  Lab 02/28/20 0126 02/28/20 0223 02/29/20 0056 03/01/20 0558 03/02/20 0445  NA 137   < > 143 145 147*  K 3.8   < > 5.3* 5.0 3.5  CL 105   < > 109 108 103  CO2 15*   < > 24 26 29   GLUCOSE 268*   < > 184* 139* 109*  BUN 20   < > 21 24* 25*  CREATININE 1.45*   < > 1.08  1.01 1.01  CALCIUM 8.7*   < > 8.1* 8.8* 9.7  PROT 6.6  --  6.6  --   --   ALBUMIN 3.9  --  3.7  --   --   AST 135*  --  80*  --   --   ALT 100*  --  86*  --   --   ALKPHOS 41  --  27*  --   --   BILITOT 0.4  --  0.5  --   --   GFRNONAA 52*   < > >60 >60 >60  GFRAA 60*   < > >60 >60 >60  ANIONGAP 17*   < > 10 11 15    < > = values in this interval not displayed.     Hematology Recent Labs  Lab 02/29/20 0056 03/01/20 0558 03/02/20 0445  WBC 14.0* 11.1* 8.8  RBC 4.14* 4.15* 3.90*  HGB 13.4 13.7 12.4*  HCT 41.9 42.0 37.8*  MCV 101.2* 101.2* 96.9  MCH 32.4 33.0 31.8  MCHC 32.0 32.6 32.8  RDW 12.0 12.2 12.0  PLT 164 181 183    BNPNo results for input(s): BNP, PROBNP in the last 168 hours.   DDimer No results for input(s): DDIMER in the last 168 hours.   Radiology    DG CHEST PORT 1 VIEW  Result Date: 03/01/2020 CLINICAL DATA:  Hypoxia EXAM: PORTABLE CHEST 1 VIEW COMPARISON:  February 29, 2020  FINDINGS: Endotracheal tube tip is 4.7 cm above the carina. Nasogastric tube tip and side port are below the diaphragm. No pneumothorax. There is atelectatic change in each lower lung region with equivocal left pleural effusion. No consolidation. Heart is upper normal in size with pulmonary vascularity normal. No adenopathy. No bone lesions. IMPRESSION: Tube positions as described without pneumothorax. Lower lung region atelectatic change with equivocal left pleural effusion. Heart upper in size. Electronically Signed   By: Lowella Grip III M.D.   On: 03/01/2020 08:51   Overnight EEG with video  Result Date: 02/29/2020 Lora Havens, MD     03/01/2020 10:02 AM Patient Name: Richard Hatfield MRN: [id number] Epilepsy Attending: Lora Havens Referring Physician/Provider: Dr Laurelyn Sickle Duration: 02/28/2020 1012 to 02/29/2020 1012 Patient history: 64yo M s/p cardiac arrest on ttm. EEg to evaluate for seizure  Level of alertness: comatose  AEDs during EEG study: Propofol  Technical aspects: This EEG study was done with scalp electrodes positioned according to the 10-20 International system of electrode placement. Electrical activity was acquired at a sampling rate of 500Hz  and reviewed with a high frequency filter of 70Hz  and a low frequency filter of 1Hz . EEG data were recorded continuously and digitally stored.  Description: EEG showed continuous generalized low amplitude 3 to 6 Hz theta-delta slowing. EEg was reactive to tactile stimulation.  Hyperventilation and photic stimulation were not performed.    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 No seizures or epileptiform discharges were seen throughout the recording.   Lora Havens    Cardiac Studies   Echo 02/28/20:  1. Normal wall motion in visualized segments. Mid to distal  anterolateral  wall not well visualized on apical images. Consider repeat with echo  contrast for wall motion when patient less agitated.. Left ventricular  ejection fraction, by estimation, is 50  to 55%. The left ventricle has low normal function. The left ventricle  has no regional wall  motion abnormalities. There is mild concentric left  ventricular hypertrophy. Left ventricular diastolic parameters were  normal.  2. Right ventricular systolic function is normal. The right ventricular  size is normal. There is mildly elevated pulmonary artery systolic  pressure.  3. Left atrial size was mildly dilated.  4. The mitral valve is normal in structure. Trivial mitral valve  regurgitation. No evidence of mitral stenosis.  5. The aortic valve has an indeterminant number of cusps. Aortic valve  regurgitation is not visualized. Mild aortic valve stenosis.   Patient Profile     64 y.o. male with out of hospital cardiac arrest. 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 No PCI performed  Assessment & Plan    1. Cardiac arrest with severe three vessel CAD: LV function preserved on echo. He is now awake and following commands. I would continue ASA an statin. He will need consideration for CABG as his neurological recovery progresses.   2. NSVT: Will add metoprolol 25 mg po BID    For questions or updates, please contact [place] Please consult [email or web address] for contact info under        Signed, Lauree Chandler, MD  03/02/2020, 7:52 AM

## 2020-03-02 NOTE — Progress Notes (Addendum)
Called to bedside to assess patient who has had agitated delirium following transfer out of ICU.  On my arrival pt is asleep, easily awakens, following commands and easily falls asleep.  qtc is WNL  Endorses occasional EtOH use, maybe 1-2 drinks/week   ICU delirium, mixed hyperactive and hypoactive  -possible component of EtOH withdrawal (has been inpt x 3 days, unknown last EtOH consumptino although does not seem to have significant EtOH hx unless frequency is greater than revealed) P -attempts at verbal re-orientation, delirium precautions -PRN qHS IV benadryl -- not taking POs yet -Will order PRN bzd and prn haldol for instances when hyperactive delirium is agitated/unsafe  -monitor for s/sx etoh dts -- low threshold to start BZD only CIWA -to [facility] 7/17 PCCM off    Eliseo Gum MSN, AGACNP-BC [place] [phone] If no answer, [phone] 03/02/2020, 4:14 PM

## 2020-03-02 NOTE — Evaluation (Signed)
Physical Therapy Evaluation Patient Details Name: Richard Hatfield MRN: [id number] DOB: 09/09/1955 Today's Date: 03/02/2020   History of Present Illness  Pt adm 7/13 with cardiac arrest. Coronary evaluation revealed diffuse obstructive CAD however no obvious acute thrombus as etiology for arrest. Intubated at the scene and extubated 7/15. PMH - HTN, DM  Clinical Impression  Pt presents to PT with significant deficits in cognition, balance, and strength s/p cardiac arrest. Expect pt will make good progress and recommend CIR for further therapy.     Follow Up Recommendations CIR    Equipment Recommendations  Other (comment) (To be determined)    Recommendations for Other Services       Precautions / Restrictions Precautions Precautions: Fall      Mobility  Bed Mobility Overal bed mobility: Needs Assistance Bed Mobility: Supine to Sit;Sit to Supine     Supine to sit: +2 for physical assistance;Total assist Sit to supine: +2 for physical assistance;Total assist   General bed mobility comments: Assist to bring legs off of bed, elevate trunk into sitting and bring hips to EOB. Assist to lower trunk and bring legs back up into bed.  Transfers                    Ambulation/Gait                Stairs            Wheelchair Mobility    Modified Rankin (Stroke Patients Only)       Balance Overall balance assessment: Needs assistance Sitting-balance support: Feet supported;Bilateral upper extremity supported Sitting balance-Leahy Scale: Poor Sitting balance - Comments: Sat EOB x 8 minutes with mod assist                                     Pertinent Vitals/Pain Pain Assessment: Faces Faces Pain Scale: No hurt    Home Living Family/patient expects to be discharged to:: Private residence Living Arrangements: Spouse/significant other Available Help at Discharge: Family Type of Home: House Home Access: Stairs to enter   [employer] of Steps: 1 Home Layout: Two level;Able to live on main level with bedroom/bathroom Home Equipment: None      Prior Function Level of Independence: Independent         Comments: 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        Extremity/Trunk Assessment   Upper Extremity Assessment Upper Extremity Assessment: Defer to OT evaluation    Lower Extremity Assessment Lower Extremity Assessment: Generalized weakness       Communication   Communication: Other (comment) (mumbled, low volume)  Cognition Arousal/Alertness: Lethargic Behavior During Therapy: Flat affect Overall Cognitive Status: Impaired/Different from baseline Area of Impairment: Orientation;Attention;Memory;Following commands;Safety/judgement;Problem solving;Awareness                 Orientation Level: Disoriented to;Place;Time;Situation Current Attention Level: Focused Memory: Decreased short-term memory Following Commands: Follows one step commands inconsistently;Follows one step commands with increased time Safety/Judgement: Decreased awareness of safety;Decreased awareness of deficits Awareness: Intellectual Problem Solving: Slow processing;Decreased initiation;Difficulty sequencing;Requires verbal cues;Requires tactile cues General Comments: Would arouse with verbal and tactile stimuli. More awake sitting EOB.      General Comments General comments (skin integrity, edema, etc.): VSS    Exercises     Assessment/Plan    PT Assessment Patient needs continued PT services  PT  Problem List Decreased strength;Decreased activity tolerance;Decreased balance;Decreased mobility;Decreased cognition       PT Treatment Interventions DME instruction;Gait training;Functional mobility training;Therapeutic activities;Therapeutic exercise;Balance training;Patient/family education;Cognitive remediation    PT Goals (Current goals can be found in the Care Plan section)   Acute Rehab PT Goals Patient Stated Goal: Pt unable PT Goal Formulation: With family Time For Goal Achievement: 03/16/20 Potential to Achieve Goals: Good    Frequency Min 3X/week   Barriers to discharge        Co-evaluation               AM-PAC PT "6 Clicks" Mobility  Outcome Measure Help needed turning from your back to your side while in a flat bed without using bedrails?: Total Help needed moving from lying on your back to sitting on the side of a flat bed without using bedrails?: Total Help needed moving to and from a bed to a chair (including a wheelchair)?: Total Help needed standing up from a chair using your arms (e.g., wheelchair or bedside chair)?: Total Help needed to walk in hospital room?: Total Help needed climbing 3-5 steps with a railing? : Total 6 Click Score: 6    End of Session   Activity Tolerance: Patient limited by lethargy Patient left: in bed;with call bell/phone within reach;with family/visitor present;with nursing/sitter in room Nurse Communication: Mobility status PT Visit Diagnosis: Other abnormalities of gait and mobility (R26.89);Muscle weakness (generalized) (M62.81);Other symptoms and signs involving the nervous system (R29.898)    Time: 5397-6734 PT Time Calculation (min) (ACUTE ONLY): 22 min   Charges:   PT Evaluation $PT Eval Moderate Complexity: [place] Pager [phone] Office [place] 03/02/2020, 5:09 PM

## 2020-03-02 NOTE — Progress Notes (Signed)
Patient had an 8-beat-run of Richard Round MD notified. Patient is lethargic due to medications, requested to have PO medications switched to IV when applicable. Patient resting calm & comfortably, no signs of distress. Will continue to monitor.   [name], RN

## 2020-03-02 NOTE — Progress Notes (Signed)
eLink Physician-Brief Progress Note Patient Name: Richard Hatfield DOB: [birth year] MRN: [id number]   Date of Service  03/02/2020  HPI/Events of Note  Notified of request to change route of administration of meds as he was given ativan earlier.  eICU Interventions  Lopressor and Famotidine changed to IV.  Continue to monitor closely.     Intervention Category Minor Interventions: Other:  Elsie Lincoln 03/02/2020, 10:34 PM

## 2020-03-02 NOTE — Progress Notes (Signed)
Patient arrived to unit confused and progressively got agitated and combative. He pulled out several lines, when his oxygen was pulled out of his nose, sats with a great pleth at 80 to 81 percent on room air. Patient frequently trying to get out of bed. No family present at this time. Patient began trying to stick the oxygen cord in his mouth. I notified Dr. Angelena Form and he sent Critical care To come see patient at bedside. At the time the PA came, Richard Hatfield was calm in bed but this has been a pattern since he has gotten to [place]. She stated she would put orders in for PRN medications if needed and order obtained for sitter. Charge Nurse Richard Hatfield made aware

## 2020-03-02 NOTE — Progress Notes (Signed)
NAME:  Richard Hatfield, MRN:  [id number], DOB:  06-30-1956, LOS: 3 ADMISSION DATE:  02/28/2020, CONSULTATION DATE: 02/28/2020 referring MD: Cardiology, CHIEF COMPLAINT: Cardiac arrest  Brief History   Patient is a 64 year old status post cardiac arrest with resuscitated with 2  History of present illness   Patient is a 64 year old white male without known history of coronary disease who complained of chest pain earlier this evening his wife called 61 and attempted to administer CPR on the scene.  EMTs arrived to proceed CPR for about 10 minutes with 2 cardioversions and 2 epinephrine doses for ventricular tachycardia with return of ROSC.  He was reintubated in the emergency room and emergently taken to the Cath Lab.  He was found to have mild diffuse disease with vasospasm.  I am seeing him in the ICU.  On my evaluation the patient is off of sedation he says received sedation earlier in the evening I believe consistent with fentanyl and Versed.  He has brisk pupillary reflexes, sluggish corneal reflexes, sluggish oculogyric movement but does overbreathing the ventilator.  The nursing staff says he responded on arrival to deep sternal rub with raising his arms. Laboratories were relatively unremarkable, he has a slight elevation in his LFTs, his creatinine is 1.4.  Past Medical History  No known prior medical history.  Is a non-smoker and drinks occasionally  Significant Hospital Events   Cardiac catheterization 02/28/2020  Consults:  Cardiology PCCM  Procedures:  As above  Significant Diagnostic Tests:  7/13 echocardiogram>>LVEF 50-55%--no regional wall abn. Normal RV size and function. Mildly elevated PASP. Mild LA dilation. No valvular abnormalities.  7/14 CXR>>increase b/l infiltrates/atelectasis  Micro Data:  NA  Antimicrobials:  NA  Interim history/subjective:  Looks good off vent.  Some ectopy on monitor, RN to see how he does with pills.  Objective   Blood pressure 140/80,  pulse 76, temperature 99.3 F (37.4 C), resp. rate 18, height 5\' 8"  (1.727 m), weight 91.7 kg, SpO2 92 %.    Vent Mode: PSV;CPAP FiO2 (%):  [40 %] 40 % PEEP:  [5 cmH20] 5 cmH20 Pressure Support:  [8 cmH20] 8 cmH20   Intake/Output Summary (Last 24 hours) at 03/02/2020 9326 Last data filed at 03/02/2020 0500 Gross per 24 hour  Intake 610.41 ml  Output 4157 ml  Net -3546.59 ml   Filed Weights   02/29/20 0500 03/01/20 0500 03/02/20 0500  Weight: 95.7 kg 94.8 kg 91.7 kg    Examination: GEN: no acute distress lying in bed HEENT: MM dry CV: RRR, +SEM, ext warm PULM: Diminished at bases with rhonci, no accessory muscle use GI: Soft, hypoactive BS EXT: No edema NEURO: Moves all 4 ext to command, globally weak PSYCH: slow to respond, a bit confused still SKIN: facial plethora, no rashes  K low: gentle repletion CBC looks good  Resolved Hospital Problem list   AKI Acute hypoxic respiratory requiring mechanical ventilation.  Assessment & Plan:   OOH Vfib cardiac arrest s/p normothermic TTM--now rewarmed. LHC showing 3 vessel disease involving LAD, circumflex, RCA. Echo only showing mildly reduced EF of 50-55% without any regional wall abnormalities.  Doing pretty good neurologically all things considered. - Continue to re-orient - Cardiology team to reach out to TCTS regarding CABG eval at some point - Tylenol PRN fever - Statin, ASA, BB - Indefinite heparin for now after discussion with cardiology - PT/OT/SLP  HTN- see how he does with BB  Aspiration pneumonia - 5 days ceftriaxone reasonable - IS, flutter  Okay for transfer out to progressive under cardiology service, please reach out if we can be of further assistance.  Erskine Emery MD PCCM

## 2020-03-02 NOTE — Progress Notes (Signed)
SLP Cancellation Note  Patient Details Name: Richard Hatfield MRN: [id number] DOB: 08-05-1956   Cancelled treatment:       Reason Eval/Treat Not Completed: Fatigue/lethargy limiting ability to participate. Pt just transferred from [facility]. Nursing and pt's wife present. Wife indicates pt is currently "out of it" and requests hold on swallow evaluation until pt has had an opportunity to rest. Per RN, all meds are IV at this time. SLP will recheck next date to assess readiness to begin po intake.  Richard Hatfield B. Quentin Ore, [facility], [place] Speech Language Pathologist Office: [phone] Pager: [phone]  Richard Hatfield 03/02/2020, 1:46 PM

## 2020-03-03 ENCOUNTER — Inpatient Hospital Stay (HOSPITAL_COMMUNITY): Payer: BC Managed Care – PPO

## 2020-03-03 ENCOUNTER — Encounter (HOSPITAL_COMMUNITY): Payer: Self-pay | Admitting: Specialist

## 2020-03-03 DIAGNOSIS — G92 Toxic encephalopathy: Secondary | ICD-10-CM | POA: Diagnosis not present

## 2020-03-03 DIAGNOSIS — J9601 Acute respiratory failure with hypoxia: Secondary | ICD-10-CM | POA: Diagnosis not present

## 2020-03-03 LAB — GLUCOSE, CAPILLARY
Glucose-Capillary: 109 mg/dL — ABNORMAL HIGH (ref 70–99)
Glucose-Capillary: 110 mg/dL — ABNORMAL HIGH (ref 70–99)
Glucose-Capillary: 123 mg/dL — ABNORMAL HIGH (ref 70–99)
Glucose-Capillary: 129 mg/dL — ABNORMAL HIGH (ref 70–99)

## 2020-03-03 LAB — BASIC METABOLIC PANEL WITH GFR
Anion gap: 17 — ABNORMAL HIGH (ref 5–15)
BUN: 28 mg/dL — ABNORMAL HIGH (ref 8–23)
CO2: 23 mmol/L (ref 22–32)
Calcium: 9.9 mg/dL (ref 8.9–10.3)
Chloride: 103 mmol/L (ref 98–111)
Creatinine, Ser: 1.02 mg/dL (ref 0.61–1.24)
GFR calc Af Amer: >60 mL/min
GFR calc non Af Amer: >60 mL/min
Glucose, Bld: 113 mg/dL — ABNORMAL HIGH (ref 70–99)
Potassium: 3.5 mmol/L (ref 3.5–5.1)
Sodium: 143 mmol/L (ref 135–145)

## 2020-03-03 LAB — PHOSPHORUS: Phosphorus: 3.4 mg/dL (ref 2.5–4.6)

## 2020-03-03 LAB — TSH: TSH: 2.92 u[IU]/mL (ref 0.350–4.500)

## 2020-03-03 LAB — CBC
HCT: 42.8 % (ref 39.0–52.0)
Hemoglobin: 14.3 g/dL (ref 13.0–17.0)
MCH: 32 pg (ref 26.0–34.0)
MCHC: 33.4 g/dL (ref 30.0–36.0)
MCV: 95.7 fL (ref 80.0–100.0)
Platelets: 225 10*3/uL (ref 150–400)
RBC: 4.47 MIL/uL (ref 4.22–5.81)
RDW: 11.6 % (ref 11.5–15.5)
WBC: 9.2 10*3/uL (ref 4.0–10.5)
nRBC: 0 % (ref 0.0–0.2)

## 2020-03-03 LAB — BLOOD GAS, ARTERIAL
Acid-Base Excess: 6 mmol/L — ABNORMAL HIGH (ref 0.0–2.0)
Bicarbonate: 29.4 mmol/L — ABNORMAL HIGH (ref 20.0–28.0)
Drawn by: 39899
FIO2: 28
O2 Saturation: 89.6 %
Patient temperature: 37.1
pCO2 arterial: 38.9 mmHg (ref 32.0–48.0)
pH, Arterial: 7.492 — ABNORMAL HIGH (ref 7.350–7.450)
pO2, Arterial: 60.1 mmHg — ABNORMAL LOW (ref 83.0–108.0)

## 2020-03-03 LAB — AMMONIA: Ammonia: 46 umol/L — ABNORMAL HIGH (ref 9–35)

## 2020-03-03 LAB — HEPARIN LEVEL (UNFRACTIONATED): Heparin Unfractionated: 0.38 IU/mL (ref 0.30–0.70)

## 2020-03-03 LAB — VITAMIN B12: Vitamin B-12: 951 pg/mL — ABNORMAL HIGH (ref 180–914)

## 2020-03-03 LAB — MAGNESIUM: Magnesium: 2.2 mg/dL (ref 1.7–2.4)

## 2020-03-03 MED ORDER — SENNOSIDES-DOCUSATE SODIUM 8.6-50 MG PO TABS
1.0000 | ORAL_TABLET | Freq: Two times a day (BID) | ORAL | Status: DC
  Administered 2020-03-04 – 2020-03-06 (×3): 1 via ORAL
  Filled 2020-03-03 (×4): qty 1

## 2020-03-03 MED ORDER — IOHEXOL 350 MG/ML SOLN
100.0000 mL | Freq: Once | INTRAVENOUS | Status: AC | PRN
  Administered 2020-03-03: 100 mL via INTRAVENOUS

## 2020-03-03 MED ORDER — ATORVASTATIN CALCIUM 80 MG PO TABS: 80.0000 mg | ORAL_TABLET | Freq: Every day | ORAL | Status: DC

## 2020-03-03 MED ORDER — LABETALOL HCL 5 MG/ML IV SOLN
INTRAVENOUS | Status: AC
  Filled 2020-03-03: qty 4

## 2020-03-03 MED ORDER — HYDRALAZINE HCL 20 MG/ML IJ SOLN
20.0000 mg | Freq: Three times a day (TID) | INTRAMUSCULAR | Status: DC
  Administered 2020-03-03 – 2020-03-05 (×6): 20 mg via INTRAVENOUS
  Filled 2020-03-03 (×6): qty 1

## 2020-03-03 MED ORDER — LABETALOL HCL 5 MG/ML IV SOLN
10.0000 mg | INTRAVENOUS | Status: DC | PRN
  Administered 2020-03-03: 10 mg via INTRAVENOUS
  Filled 2020-03-03: qty 4

## 2020-03-03 MED ORDER — HYDRALAZINE HCL 20 MG/ML IJ SOLN
10.0000 mg | Freq: Four times a day (QID) | INTRAMUSCULAR | Status: DC
  Administered 2020-03-03: 10 mg via INTRAVENOUS
  Filled 2020-03-03: qty 1

## 2020-03-03 MED ORDER — HYDRALAZINE HCL 20 MG/ML IJ SOLN: 20.0000 mg | Freq: Four times a day (QID) | INTRAMUSCULAR | Status: DC

## 2020-03-03 MED ORDER — METOPROLOL TARTRATE 5 MG/5ML IV SOLN
5.0000 mg | Freq: Four times a day (QID) | INTRAVENOUS | Status: DC
  Administered 2020-03-03 – 2020-03-05 (×8): 5 mg via INTRAVENOUS
  Filled 2020-03-03 (×8): qty 5

## 2020-03-03 MED ORDER — POTASSIUM CHLORIDE 10 MEQ/100ML IV SOLN
10.0000 meq | INTRAVENOUS | Status: AC
  Administered 2020-03-03 (×6): 10 meq via INTRAVENOUS
  Filled 2020-03-03 (×2): qty 100

## 2020-03-03 MED ORDER — LABETALOL HCL 5 MG/ML IV SOLN: 10.0000 mg | INTRAVENOUS | Status: DC | PRN

## 2020-03-03 NOTE — Significant Event (Signed)
Rapid Response Event Note  Overview: While rounding on the floor, I walked into the patient's room and the nurse was having trouble arousing the patient.  She stated that the patient had received 6 mg of ativan, but had not received any since 0333.  She also stated that the patient was reported to converse and answer questions.  The patient was reported to be extremely agitated the previous day, and it was suspected that he had ICU delirium or was withdrawing from alcohol.  CCM had just seen this patient the previous day and was about to sign off on the patient.  This patient had an MI but was not a candidate for CABG.       Initial Focused Assessment:  Patient was only arousible to pain (sternal rub).  His respirations were labored and he would have periods of apnea.  His O2 level would decrease to the high 80's.  Patient was not following commands, but would withdrawal to pain.  Pupils were equal but sluggish.  Patient was also receiving Heparin, and there was some concern about possible brain bleed. Decrease breath sounds on right side  BP 187/97 (124) Pulse 89 Resp 23 Temp 100.9 rectal CBG 109   Interventions:  ABG, stat chest xray, Code Stroke activated, Heparin paused, CT of head ordered and performed, and eventual transfer to CVICU  Plan of Care (if not transferred): Transfer to CVICU  Event Summary:  Started at  [facility] at  [employer]

## 2020-03-03 NOTE — Progress Notes (Signed)
While performing assessment on patient, patient noted to be lethargic and minimally responsive. Opens eyes to painful stimuli, not following commands or answering questions. Moves all extremities spontaneously. Patient noted to be having periods of apnea followed by tachypnea. Oxygen saturations >92% on room air & occasionally dropping to 88% during apnea periods. Placed patient on [facility]. Rapid response RN was performing rounds on department at this time and came to bedside. Dr. Tawanna Solo was notified & came to bedside; critical care was notified by rapid response & came to bedside. Code stroke was initiated and patient was transported to [address]. Patient was transported from CT to [place]; bedside report given to receiving RN.

## 2020-03-03 NOTE — Progress Notes (Signed)
NAME:  Richard Hatfield, MRN:  [id number], DOB:  1956/06/09, LOS: 4 ADMISSION DATE:  02/28/2020, CONSULTATION DATE: 02/28/2020 referring MD: Cardiology, CHIEF COMPLAINT: Cardiac arrest  Brief History   Patient is a 64 year old status post cardiac arrest, ROSC after 10 minutes.  VDRF, extubated on 7/15.  Left heart cath with diffuse CAD and evidence for vasospasm.  Transferred to floor 7/16 on heparin infusion, empiric antibiotics for possible aspiration pneumonia.  Subsequent course complicated by agitated delirium.  Acute encephalopathy 7/17 a.m. after Ativan.  Back to ICU 7/17, code stroke called.   Past Medical History  No known prior medical history.  Is a non-smoker and drinks occasionally  Significant Hospital Events   Cardiac catheterization 02/28/2020  Consults:  Cardiology PCCM  Procedures:  ET tube 7/13 >> 7/15  Significant Diagnostic Tests:  7/13 echocardiogram>>LVEF 50-55%--no regional wall abn. Normal RV size and function. Mildly elevated PASP. Mild LA dilation. No valvular abnormalities.  7/14 CXR>>increase b/l infiltrates/atelectasis  CT head 7/17 >> no acute bleed, no hydrocephalus, preliminary vascular eval reassuring.  Full perfusion report pending  Micro Data:  COVID-19 7/13 >> negative MRSA screen 7/13 >> negative  Antimicrobials:  Ceftriaxone 7/14 >>   Interim history/subjective:  Agitated delirium last 24 hours, concern for possible alcohol withdrawal.  Received Ativan, last dose 0300 7/17.  Became obtunded, increase work of breathing.  Code stroke activated, head CT as above  Objective   Blood pressure (!) 188/91, pulse 88, temperature 98.8 F (37.1 C), temperature source Oral, resp. rate (!) 21, height 5\' 8"  (1.727 m), weight 91.7 kg, SpO2 95 %.        Intake/Output Summary (Last 24 hours) at 03/03/2020 1123 Last data filed at 03/03/2020 0651 Gross per 24 hour  Intake 452.15 ml  Output 2076 ml  Net -1623.85 ml   Filed Weights   02/29/20 0500 03/01/20  0500 03/02/20 0500  Weight: 95.7 kg 94.8 kg 91.7 kg    Examination: GEN: Ill-appearing man, laying in bed, uncomfortable HEENT: Oropharynx dry CV: Regular, distant, no murmur PULM: Small breath sounds, decreased at both bases, no wheezing or crackles.  Some use of accessory muscles to breathe GI: Nondistended, positive bowel sounds EXT: Trace pretibial edema NEURO: Moaning, turns head to voice, unclear whether he will track, will not follow commands, unable to phonate, spontaneously moves upper and lower extremities PSYCH: Will not respond or participate SKIN: No rash  Resolved Hospital Problem list   AKI Acute hypoxic respiratory requiring mechanical ventilation.  Assessment & Plan:   Acute toxic metabolic encephalopathy.  Suspect that this reflects the effects of sedating medication superimposed on his known agitated delirium, probable hypoxic injury from his VF arrest.  Hypertensive 7/17, possible contributor to his encephalopathy -Appreciate neurology assistance.  Follow head CT for final results; no bleed or acute change seen in his parenchyma, vascular eval pending -EEG as per neurology recommendations -Once stable to do so and able to participate will try to obtain MRI brain to better characterize any subtle hypoxic injury post arrest  Acute hypoxemic respiratory failure.  Principally due to suppressed mental status. -ABG reassuring -Appears to be protecting his airway currently although will not follow commands, cough on command, phonate -At risk for intubation mechanical ventilation for airway protection, followed closely for interval change -Follow chest x-ray  OOH Vfib cardiac arrest s/p normothermic TTM--now rewarmed. CAD, diffuse. -Heparin infusion for now, ? Duration post-arrest -ASA, statin, beta-blockade as ordered -Appreciate cardiology assistance, he will be evaluated for suitability for possible  CABG depending on progress, mental status, etc. -PT/OT/SLP  HTN-   -Amlodipine if he is able to take it.  We may need to transition him to IV regimen -Scheduled metoprolol IV, consider transition to per tube  Aspiration pneumonia -Plan 5 days total ceftriaxone -pulm hygiene  Independent critical care time 45 minutes  Baltazar Apo, MD, PhD 03/03/2020, 11:43 AM [facility] [phone] or if no answer [phone]

## 2020-03-03 NOTE — Progress Notes (Signed)
PROGRESS NOTE    Richard Hatfield  KDT:[id number] DOB: 06-26-1956 DOA: 02/28/2020 PCP: System, Pcp Not In   Brief Narrative: Patient is a 64 year old male without known history of coronary artery disease who was brought to the emergency department after he had cardiac arrest.  He complained of chest pain on the day of admission and had cardiac arrest.  His wife attempted CPR.  EMS continued CPR for about 10 minutes with 2 cardioversions and 20 (doses with reversal of circulation.  He was intubated in emergency department and emergently taken to Cath Lab.  He was found to have mild diffuse disease with vasospasm.  Cardiology consulted.  Plan for CABG.  Patient was transferred to [place] from [facility] on 03/03/2020. This morning, I was called to the bedside because patient was apneic, in respiratory distress with tachypnea.  Patient has been transferred back to ICU.  Assessment & Plan:   Active Problems:   Cardiac arrest [facility])   ACS (acute coronary syndrome) ([place])   Type 2 diabetes mellitus with complication, without long-term current use of insulin (HCC)   Cardiac arrest: Lateral catheterization showed three-vessel disease involving LAD, circumflex, RCA.  Echo showed ejection fraction of 50 to 55% without regional wall motion abnormality.  Cardiology team reaching out to the ED TCTS regarding CABG.  On heparin drip.  On statin, aspirin, beta-blocker. We will follow-up with the speech therapy for starting on diet.  Nonsustained V. tach: Continue metoprolol 25 mg twice daily.  Debility/deconditioning: PT/OT evaluated the patient and recommended CIR.  Aspiration pneumonia: On ceftriaxone.  Hypertension: On amlodipine.  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  Neurology following.  CT head done this morning did not show any acute intracranial abnormalities.  Being worked up for  possible stroke    Nutrition Problem: Increased nutrient needs Etiology: acute illness      DVT prophylaxis:Heparin iV Code Status: Full Family Communication: None Status is: Inpatient  Remains inpatient appropriate because:Hemodynamically unstable   Dispo: The patient is from: Home              Anticipated d/c is to: CIR              Anticipated d/c date is: Not sure              Patient currently is not medically stable to d/c.      Consultants: Cardiology, PCCM  Procedures: Intubation  Antimicrobials:  Anti-infectives (From admission, onward)   Start     Dose/Rate Route Frequency Ordered Stop   02/29/20 1100  cefTRIAXone (ROCEPHIN) 2 g in sodium chloride 0.9 % 100 mL IVPB     Discontinue     2 g 200 mL/hr over 30 Minutes Intravenous Every 24 hours 02/29/20 1027 03/05/20 1059      Subjective: Patient was briefly seen in the morning .  Rapid response was going on.  He was tachypneic.  Hardly opening his eyes, unable to follow any commands.  Lethargic.  Objective: Vitals:   03/02/20 1954 03/03/20 0007 03/03/20 0451 03/03/20 0730  BP:  (!) 165/82 (!) 188/86 (!) 188/91  Pulse:  88 84 88  Resp:  20 19 (!) 21  Temp: 98.4 F (36.9 C) 98.1 F (36.7 C) 98.4 F (36.9 C) 98.8 F (37.1 C)  TempSrc:  Oral Oral Oral  SpO2:  97% 99% 95%  Weight:      Height:  Intake/Output Summary (Last 24 hours) at 03/03/2020 0847 Last data filed at 03/03/2020 0651 Gross per 24 hour  Intake 502.92 ml  Output 2526 ml  Net -2023.08 ml   Filed Weights   02/29/20 0500 03/01/20 0500 03/02/20 0500  Weight: 95.7 kg 94.8 kg 91.7 kg    Examination:  General exam: Lethargic HEENT: Feeding tube Respiratory system: Decreased air entry on the left side  cardiovascular system: S1 & S2 heard, RRR. No JVD, murmurs, rubs, gallops or clicks. No pedal edema. Gastrointestinal system: Abdomen is nondistended, soft and nontender. Central nervous system: Not Alert and oriented.  . Extremities: No edema, no clubbing ,no cyanosis Skin: No rashes, lesions or ulcers,no icterus ,no pallor    Data Reviewed: I have personally reviewed following labs and imaging studies  CBC: Recent Labs  Lab 02/28/20 0126 02/28/20 0223 02/28/20 0506 02/28/20 0543 02/29/20 0056 03/01/20 0558 03/02/20 0445  WBC 12.4*  --   --  12.7* 14.0* 11.1* 8.8  NEUTROABS 5.0  --   --   --   --   --   --   HGB 15.1   < > 14.3 13.5 13.4 13.7 12.4*  HCT 44.8   < > 42.0 40.5 41.9 42.0 37.8*  MCV 96.8  --   --  96.4 101.2* 101.2* 96.9  PLT 211  --   --  173 164 181 183   < > = values in this interval not displayed.   Basic Metabolic Panel: Recent Labs  Lab 02/28/20 0543 02/28/20 2006 02/29/20 0056 03/01/20 0558 03/02/20 0445  NA 139 144 143 145 147*  K 4.5 5.9* 5.3* 5.0 3.5  CL 108 110 109 108 103  CO2 20* 19* 24 26 29   GLUCOSE 217* 179* 184* 139* 109*  BUN 25* 18 21 24* 25*  CREATININE 1.29* 1.10 1.08 1.01 1.01  CALCIUM 8.3* 8.2* 8.1* 8.8* 9.7  MG  --   --  2.3 2.8* 2.3  PHOS  --   --  5.3* 3.7 2.4*   GFR: Estimated Creatinine Clearance: 84.4 mL/min (by C-G formula based on SCr of 1.01 mg/dL). Liver Function Tests: Recent Labs  Lab 02/28/20 0126 02/29/20 0056  AST 135* 80*  ALT 100* 86*  ALKPHOS 41 27*  BILITOT 0.4 0.5  PROT 6.6 6.6  ALBUMIN 3.9 3.7   No results for input(s): LIPASE, AMYLASE in the last 168 hours. No results for input(s): AMMONIA in the last 168 hours. Coagulation Profile: Recent Labs  Lab 02/28/20 0126 02/28/20 0543 02/28/20 2006  INR 1.1 1.2 1.1   Cardiac Enzymes: No results for input(s): CKTOTAL, CKMB, CKMBINDEX, TROPONINI in the last 168 hours. BNP (last 3 results) No results for input(s): PROBNP in the last 8760 hours. HbA1C: No results for input(s): HGBA1C in the last 72 hours. CBG: Recent Labs  Lab 03/02/20 1652 03/02/20 1951 03/03/20 0009 03/03/20 0454 03/03/20 0729  GLUCAP 110* 118* 129* 110* 109*   Lipid Profile: Recent  Labs    03/01/20 0558 03/02/20 0445  TRIG 350* 158*   Thyroid Function Tests: No results for input(s): TSH, T4TOTAL, FREET4, T3FREE, THYROIDAB in the last 72 hours. Anemia Panel: No results for input(s): VITAMINB12, FOLATE, FERRITIN, TIBC, IRON, RETICCTPCT in the last 72 hours. Sepsis Labs: No results for input(s): PROCALCITON, LATICACIDVEN in the last 168 hours.  Recent Results (from the past 240 hour(s))  SARS Coronavirus 2 by RT PCR (hospital order, performed in [facility] hospital lab) Nasopharyngeal Nasopharyngeal Swab  Status: None   Collection Time: 02/28/20  1:33 AM   Specimen: Nasopharyngeal Swab  Result Value Ref Range Status   SARS Coronavirus 2 NEGATIVE NEGATIVE Final    Comment: (NOTE) SARS-CoV-2 target nucleic acids are NOT DETECTED.  The SARS-CoV-2 RNA is generally detectable in upper and lower respiratory specimens during the acute phase of infection. The lowest concentration of SARS-CoV-2 viral copies this assay can detect is 250 copies / mL. A negative result does not preclude SARS-CoV-2 infection and should not be used as the sole basis for treatment or other patient management decisions.  A negative result may occur with improper specimen collection / handling, submission of specimen other than nasopharyngeal swab, presence of viral mutation(s) within the areas targeted by this assay, and inadequate number of viral copies (<250 copies / mL). A negative result must be combined with clinical observations, patient history, and epidemiological information.  Fact Sheet for Patients:   StrictlyIdeas.no  Fact Sheet for Healthcare Providers: BankingDealers.co.za  This test is not yet approved or  cleared by the [place] FDA and has been authorized for detection and/or diagnosis of SARS-CoV-2 by FDA under an Emergency Use Authorization (EUA).  This EUA will remain in effect (meaning this test can be used) for  the duration of the COVID-19 declaration under Section 564(b)(1) of the Act, 21 U.S.C. section 360bbb-3(b)(1), unless the authorization is terminated or revoked sooner.  Performed at [facility], [place] [address] [place], [place] [address]   MRSA PCR Screening     Status: None   Collection Time: 02/28/20  4:08 AM   Specimen: Nasopharyngeal  Result Value Ref Range Status   MRSA by PCR NEGATIVE NEGATIVE Final    Comment:        The GeneXpert MRSA Assay (FDA approved for NASAL specimens only), is one component of a comprehensive MRSA colonization surveillance program. It is not intended to diagnose MRSA infection nor to guide or monitor treatment for MRSA infections. Performed at [facility], [place] [address] [place], [place] [address]          Radiology Studies: DG CHEST PORT 1 VIEW  Result Date: 03/01/2020 CLINICAL DATA:  Hypoxia EXAM: PORTABLE CHEST 1 VIEW COMPARISON:  February 29, 2020 FINDINGS: Endotracheal tube tip is 4.7 cm above the carina. Nasogastric tube tip and side port are below the diaphragm. No pneumothorax. There is atelectatic change in each lower lung region with equivocal left pleural effusion. No consolidation. Heart is upper normal in size with pulmonary vascularity normal. No adenopathy. No bone lesions. IMPRESSION: Tube positions as described without pneumothorax. Lower lung region atelectatic change with equivocal left pleural effusion. Heart upper in size. Electronically Signed   By: Lowella Grip III M.D.   On: 03/01/2020 08:51        Scheduled Meds: . amLODipine  10 mg Oral Daily  . aspirin  81 mg Oral Daily  . atorvastatin  80 mg Oral Daily  . Chlorhexidine Gluconate Cloth  6 each Topical Daily  . insulin aspart  0-9 Units Subcutaneous Q4H  . mouth rinse  15 mL Mouth Rinse BID  . metoprolol tartrate  2.5 mg Intravenous Q6H  . polyethylene glycol  17 g Oral Daily  . senna-docusate  1 tablet Oral BID  . sodium chloride flush  3 mL  Intravenous Q12H   Continuous Infusions: . sodium chloride 10 mL/hr at 02/28/20 0144  . sodium chloride 10 mL/hr at 03/01/20 1018  . cefTRIAXone (ROCEPHIN)  IV Stopped (03/02/20 1150)  . famotidine (PEPCID) IV Stopped (03/03/20 0136)  . feeding supplement (VITAL AF 1.2 CAL) Stopped (03/01/20 1100)  . heparin 1,750 Units/hr (03/03/20 0500)     LOS: 4 days    Time spent: 35 mins.More than 50% of that time was spent in counseling and/or coordination of care.      Shelly Coss, MD [facility] P7/17/2021, 8:47 AM

## 2020-03-03 NOTE — Progress Notes (Signed)
      64 y.o. male with out of hospital cardiac arrest. 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 No PCI performed.    Dr Camillia Herter rounding note reviewed from yesterday, chart and telemetry reviewed.   Assessment & Plan    1. Cardiac arrest - from notes out of hospital VT arrest - cath showed 3 vessel disease, no intervention - making progress neurologically, however from overnight notes some ongoing issues with delerium/agitation. Working with PT/OT/speech. Speech notes lethargic, limited ability to participate. Continue to monitor progress, will contact CT surgery once steadily improving to consider CABG - continue medical therapy with ASA, atorva, metop, hep gtt.   2. NSVT - oral lopressor changed to IV lopressor due to inconsistent mental status and taking meds - limited venttricular ectopy, continue IV lopressor. Keep K at 4 and Mg at 2.   For questions or updates, please contact [place] Please consult [email or web address] for contact info under        Signed, Carlyle Dolly, MD  03/03/2020, 9:41 AM

## 2020-03-03 NOTE — Procedures (Signed)
Patient Name: Richard Hatfield  MRN: [id number]  Epilepsy Attending: Lora Havens  Referring Physician/Provider: Dr Amie [place] Date: 7/08/25/2019 Duration: 23.08 mins  Patient history: 64 year old past history of diabetes, hypertension, with no known coronary disease brought in after cardiac arrest requiring 10 minutes of CPR. Had worsening mental status today. EEG to evaluate for seizure.   Level of alertness: comatose  AEDs during EEG study: None  Technical aspects: This EEG study was done with scalp electrodes positioned according to the 10-20 International system of electrode placement. Electrical activity was acquired at a sampling rate of 500Hz  and reviewed with a high frequency filter of 70Hz  and a low frequency filter of 1Hz . EEG data were recorded continuously and digitally stored.   Description: EEG showed continuous generalized 5-8 Hz theta slowing as well as 2-3Hz  delta slowing. Hyperventilation and photic stimulation were not performed.     ABNORMALITY -Continuous slow, generalized  IMPRESSION: This study is suggestive of severe diffuse encephalopathy, nonspecific etiology. No seizures or epileptiform discharges were seen throughout the recording.  Richard Hatfield

## 2020-03-03 NOTE — Progress Notes (Signed)
Antihypertensive medications adjusted this afternoon due to persistent HTN, SBP 160s-180s. Unable to take previously ordered POs due to AMS  -POs antihypertensives discontinued at this time, re-introduce when clinically appropriate  -Scheduled IV Hydralazine changed from 10mg  q6hr to 20mg  q8hr  -Scheduled IV metoprolol maintained at 5mg  q6hr -PRN labetalol ordered q2hr PRN if SBP > [place] MSN, AGACNP-BC [place] [phone] If no answer, [phone] 03/03/2020, 5:14 PM

## 2020-03-03 NOTE — Progress Notes (Signed)
CCM notified at this time PRN Labetalol ordered for systolic greater than 103 mmHG

## 2020-03-03 NOTE — Evaluation (Signed)
SLP Cancellation Note  Patient Details Name: Richard Hatfield MRN: [id number] DOB: 30-Nov-1955   Cancelled treatment:       Reason Eval/Treat Not Completed: Fatigue/lethargy limiting ability to participate (pt lethargic, open mouth breathing with episodes of apnea, did not awaken adequately for eval or po, will continue efforts)  Kathleen Lime, MS [place]  Macario Golds 03/03/2020, 8:29 AM

## 2020-03-03 NOTE — Progress Notes (Signed)
Inpatient Rehab Admissions Coordinator Note:   Per therapy recommendations, pt was screened for CIR candidacy by Neville Pauls, MS CCC-SLP. At this time, Pt. Appears to have functional decline and is a good candidate for CIR. Will request order for rehab consult per protocol.  Please contact me with questions.   Natascha Edmonds, MS, CCC-SLP Rehab Admissions Coordinator  [phone] (celll) [phone] (office)  

## 2020-03-03 NOTE — Progress Notes (Signed)
[place] for heparin Indication: CAD awaiting CVTS consult  No Known Allergies  Patient Measurements: Height: 5\' 8"  (172.7 cm) Weight: 91.7 kg (202 lb 2.6 oz) IBW/kg (Calculated) : 68.4 Heparin Dosing Weight: 90kg  Vital Signs: Temp: 98.8 F (37.1 C) (07/17 0730) Temp Source: Oral (07/17 0730) BP: 188/91 (07/17 0730) Pulse Rate: 88 (07/17 0730)  Labs: Recent Labs    03/01/20 0558 03/01/20 0558 03/02/20 0445 03/03/20 0930  HGB 13.7   < > 12.4* 14.3  HCT 42.0  --  37.8* 42.8  PLT 181  --  183 225  HEPARINUNFRC 0.32  --  0.30 0.38  CREATININE 1.01  --  1.01 1.02   < > = values in this interval not displayed.    Estimated Creatinine Clearance: 83.6 mL/min (by C-G formula based on SCr of 1.02 mg/dL).   Assessment: 64yo male had witnessed VF arrest, called as code STEMI and sent emergently to cath lab which revealed diffuse multivessel CAD >> awaiting CVTS consult.  Heparin resumed post-cath.  Heparin drip 1750 uts/hr HL 0.37 at goal - no bleeding, CBC ok MS changes this am > heparin stopped and CT can to r/o head bleed negative  Per MD ok to resume heparin but since event 5 days ago and no CP - awaiting CABG will keep toward lower end goal  Goal of Therapy:  Heparin level 0.3-0.7 units/ml Monitor platelets by anticoagulation protocol: Yes   Plan:  heparin infusion to 1700 units/hr Monitor daily HL, CBC, s/sx bleeding  Bonnita Nasuti Pharm.D. CPP, BCPS Clinical Pharmacist [phone] 03/03/2020 11:58 AM    Please check [email or web address] for unit-specific pharmacy phone numbers.

## 2020-03-03 NOTE — Consult Note (Signed)
Neurology Consultation  Reason for Consult: Code stroke for altered mental status, possible aphasia, decreased level of consciousness Referring Physician: Eliseo Gum, NP, critical care  CC: Code stroke-altered mental status-decreased responsiveness-possible aphasia.  History is obtained from: Chart review  HPI: Richard Hatfield is a 64 y.o. male past medical history significant for diabetes and hypertension, with no known history of cardiac/coronary disease brought into the emergency room on 02/28/2020 after a cardiac arrest requiring CPR on scene.  CPR lasted for about 10 minutes with 2 cardioversions and 2 doses of epinephrine for ventricular tachycardia prior to ROSC.  He was intubated in the field and reintubated again in the emergency room and emergently taken to Cath Lab.  Was found to have mild disease with diffuse vasospasm.  He was transferred to the floor yesterday, and continued to be agitated but was awake.  This morning found to be much more drowsy and lethargic and not following commands when he was following some commands yesterday. Code stroke activated as he is on heparin drip as well for concern for stroke versus ICH. Patient unable to provide any history Per chart review and talking with the primary team-has a significant history of alcohol abuse. Received 2 mg of Ativan at 3 AM.  LKW: Unclear-possibly sometime yesterday but no specific timeline available tpa given?: no, on a heparin drip Premorbid modified Rankin scale (mRS): Unable to ascertain from the patient.  ROS:  Unable to obtain due to altered mental status.   Past Medical History:  Diagnosis Date  . Diabetes mellitus without complication ([place])   . Hypertension    No family history on file. Patient unable to provide history due to altered mental status/encephalopathy  Social History:   reports that he has never smoked. He has never used smokeless tobacco. He reports previous alcohol use. He reports that he does  not use drugs.  Medications  Current Facility-Administered Medications:  .  0.9 %  sodium chloride infusion, , Intravenous, Continuous, Delora Fuel, MD, Last Rate: 10 mL/hr at 02/28/20 0144, New Bag at 02/28/20 0144 .  0.9 %  sodium chloride infusion, 250 mL, Intravenous, PRN, Troy Sine, MD, Last Rate: 10 mL/hr at 03/01/20 1018, IV Pump Association at 03/01/20 1018 .  acetaminophen (TYLENOL) 160 MG/5ML solution 650 mg, 650 mg, Per Tube, Q4H PRN, Shellia Cleverly, MD, 650 mg at 03/01/20 0814 .  amLODipine (NORVASC) tablet 10 mg, 10 mg, Oral, Daily, Candee Furbish, MD, 10 mg at 03/02/20 1119 .  aspirin chewable tablet 81 mg, 81 mg, Oral, Daily, Candee Furbish, MD, 81 mg at 03/02/20 0902 .  atorvastatin (LIPITOR) tablet 80 mg, 80 mg, Oral, Daily, Candee Furbish, MD, 80 mg at 03/02/20 0902 .  cefTRIAXone (ROCEPHIN) 2 g in sodium chloride 0.9 % 100 mL IVPB, 2 g, Intravenous, Q24H, Christian, Rylee, MD, Stopped at 03/02/20 1150 .  Chlorhexidine Gluconate Cloth 2 % PADS 6 each, 6 each, Topical, Daily, Candee Furbish, MD, 6 each at 03/02/20 0901 .  diphenhydrAMINE (BENADRYL) injection 25 mg, 25 mg, Intravenous, QHS PRN, Bowser, Grace E, NP .  famotidine (PEPCID) IVPB 20 mg premix, 20 mg, Intravenous, Q12H, Elsie Lincoln, MD, Paused at 03/03/20 0136 .  feeding supplement (VITAL AF 1.2 CAL) liquid 1,000 mL, 1,000 mL, Per Tube, Continuous, Shellia Cleverly, MD, Stopped at 03/01/20 1100 .  haloperidol lactate (HALDOL) injection 1-4 mg, 1-4 mg, Intravenous, Q3H PRN, Bowser, Grace E, NP .  heparin ADULT infusion 100 units/mL (25000 units/264mL  sodium chloride 0.45%), 1,750 Units/hr, Intravenous, Continuous, Ko, Christine, RPH, Last Rate: 17.5 mL/hr at 03/03/20 0500, 1,750 Units/hr at 03/03/20 0500 .  insulin aspart (novoLOG) injection 0-9 Units, 0-9 Units, Subcutaneous, Q4H, Candee Furbish, MD, 1 Units at 03/03/20 0103 .  labetalol (NORMODYNE) 5 MG/ML injection, , , ,  .  labetalol (NORMODYNE)  injection 10 mg, 10 mg, Intravenous, Q2H PRN, Adhikari, Amrit, MD .  LORazepam (ATIVAN) injection 1-2 mg, 1-2 mg, Intravenous, Q4H PRN, Bowser, Laurel Dimmer, NP, 2 mg at 03/03/20 0333 .  MEDLINE mouth rinse, 15 mL, Mouth Rinse, BID, Candee Furbish, MD, 15 mL at 03/02/20 0905 .  metoprolol tartrate (LOPRESSOR) injection 2.5 mg, 2.5 mg, Intravenous, Q6H, Elsie Lincoln, MD, 2.5 mg at 03/03/20 0548 .  ondansetron (ZOFRAN) injection 4 mg, 4 mg, Intravenous, Q6H PRN, Troy Sine, MD .  polyethylene glycol (MIRALAX / GLYCOLAX) packet 17 g, 17 g, Oral, Daily, Candee Furbish, MD .  senna-docusate (Senokot-S) tablet 1 tablet, 1 tablet, Oral, BID, Candee Furbish, MD, 1 tablet at 03/01/20 2236 .  sodium chloride flush (NS) 0.9 % injection 3 mL, 3 mL, Intravenous, Q12H, Troy Sine, MD, 3 mL at 03/02/20 0905 .  sodium chloride flush (NS) 0.9 % injection 3 mL, 3 mL, Intravenous, PRN, Troy Sine, MD  Exam: Current vital signs: BP (!) 188/91 (BP Location: Left Arm)   Pulse 88   Temp 98.8 F (37.1 C) (Oral)   Resp (!) 21   Ht 5\' 8"  (1.727 m)   Wt 91.7 kg   SpO2 95%   BMI 30.74 kg/m  Vital signs in last 24 hours: Temp:  [98.1 F (36.7 C)-99 F (37.2 C)] 98.8 F (37.1 C) (07/17 0730) Pulse Rate:  [70-89] 88 (07/17 0730) Resp:  [13-21] 21 (07/17 0730) BP: (155-188)/(82-94) 188/91 (07/17 0730) SpO2:  [87 %-99 %] 95 % (07/17 0730) General: Very drowsy, very difficult to arouse, breathing fast HEENT: Normocephalic, atraumatic, dry oral mucous membranes CVS: Regular rhythm Extremities warm well perfused Neurological exam Extremely drowsy, very difficult to arouse. Opens eyes to noxious stimulation. Does not follow commands Cranial nerves: Pupils equal round react to light, extraocular movements are not restricted and he has positive oculocephalics and corneals, does not blink to threat from either side, face appears grossly symmetric. Motor exam: Spontaneously moves all 4 extremities.  To  noxious immolation has good withdrawal on the lower extremities and localizes on the upper extremities purposefully. Sensory exam: As above Coordination cannot be tested  Labs I have reviewed labs in epic and the results pertinent to this consultation are: CBC    Component Value Date/Time   WBC 9.2 03/03/2020 0930   RBC 4.47 03/03/2020 0930   HGB 14.3 03/03/2020 0930   HCT 42.8 03/03/2020 0930   PLT 225 03/03/2020 0930   MCV 95.7 03/03/2020 0930   MCH 32.0 03/03/2020 0930   MCHC 33.4 03/03/2020 0930   RDW 11.6 03/03/2020 0930   LYMPHSABS 5.7 (H) 02/28/2020 0126   MONOABS 0.9 02/28/2020 0126   EOSABS 0.2 02/28/2020 0126   BASOSABS 0.1 02/28/2020 0126  CMP     Component Value Date/Time   NA 143 03/03/2020 0930   K 3.5 03/03/2020 0930   CL 103 03/03/2020 0930   CO2 23 03/03/2020 0930   GLUCOSE 113 (H) 03/03/2020 0930   BUN 28 (H) 03/03/2020 0930   CREATININE 1.02 03/03/2020 0930   CALCIUM 9.9 03/03/2020 0930   PROT 6.6 02/29/2020 0056  ALBUMIN 3.7 02/29/2020 0056   AST 80 (H) 02/29/2020 0056   ALT 86 (H) 02/29/2020 0056   ALKPHOS 27 (L) 02/29/2020 0056   BILITOT 0.5 02/29/2020 0056   GFRNONAA >60 03/03/2020 0930   GFRAA >60 03/03/2020 0930   Imaging I have reviewed the images obtained: CT-scan head: No bleed, no acute changes.  Assessment:  64 year old past history of diabetes hypertension with no known coronary disease brought in after cardiac arrest requiring 10 minutes of CPR with cardioversion and to use of epinephrine due to ventricular tachycardia.  ROSC obtained after the above measures. Was taken for cath emergently.  Found to have mild disease with diffuse vasospasm. Transferred to floor from the cardiac ICU yesterday-remained agitated and delirious but was more awake. This morning found to be much more drowsy and lethargic and not following commands. On heparin drip because of the NSTEMI-concern for stroke versus ICH. Code stroke activated due to the  drowsiness and inability to communicate. On examination, he appears to be densely encephalopathic.  No focal cranial nerve motor or sensory findings appreciated. Stat head CT-negative CTA- no ELVO No tPA as he is on heparin Not a candidate for EVT due to no LVO.  Impression: -Likely toxic metabolic encephalopathy versus hypoxic ischemic encephalopathy given the recent cardiac arrest -Evaluate for/rule out stroke -less likely based on nonfocal exam -Evaluate/rule out seizures given HIE.  Recommendations: -MRI brain without contrast when able  -Reduce sedating meds -TSH, B12, Ammonia -EEG stat -Supportive care per PCCM Will follow.  -- Amie Portland, MD [facility] Pager: [phone] If 7pm to 7am, please call on call as listed on AMION.  CRITICAL CARE ATTESTATION Performed by: Amie Portland, MD Total critical care time: 45 minutes Critical care time was exclusive of separately billable procedures and treating other patients and/or supervising APPs/Residents/Students Critical care was necessary to treat or prevent imminent or life-threatening deterioration due to toxic metabolic encephalopathy.  This patient is critically ill and at significant risk for neurological worsening and/or death and care requires constant monitoring. Critical care was time spent personally by me on the following activities: development of treatment plan with patient and/or surrogate as well as nursing, discussions with consultants, evaluation of patient's response to treatment, examination of patient, obtaining history from patient or surrogate, ordering and performing treatments and interventions, ordering and review of laboratory studies, ordering and review of radiographic studies, pulse oximetry, re-evaluation of patient's condition, participation in multidisciplinary rounds and medical decision making of high complexity in the care of this patient.   Addendum Stat EEG negative for seizures  or epileptiform discharges.  Only shows severe diffuse encephalopathy. MRI pending at this time We will follow -- Amie Portland, MD [facility] Pager: [phone] If 7pm to 7am, please call on call as listed on AMION.

## 2020-03-03 NOTE — Progress Notes (Signed)
PCCM Brief Note  Called to bedside by Rapid response S/p Vt arrest with ROSC, moved out of ICU 7/16. Has been maintained on heparin gtt  AMS this morning, given ativan at 0330.  Periods of apnea  ABG pending  Doesn't follow commands Mumbling, garbled speech  PERRL 64mm   This is a significant change from mentation yesterday, when patient would awaken and follow commands.  Code Stroke called given new AMS + on heparin gtt, STAT CT H to r/o ICH   Will take back on PCCM service move to ICU following CT H Full prog note to follow   Eliseo Gum MSN, AGACNP-BC [place] [phone] If no answer, [phone] 03/03/2020, 10:44 AM

## 2020-03-03 NOTE — Progress Notes (Signed)
OT Cancellation Note  Patient Details Name: Mattew Chriswell MRN: [id number] DOB: 06/17/1956   Cancelled Treatment:    Reason Eval/Treat Not Completed: Medical issues which prohibited therapy (Rapid response called this am, pt transferred back to ICU.)  Malka So 03/03/2020, 11:45 AM  Nestor Lewandowsky, OTR/L Acute Rehabilitation Services Pager: [phone] Office: [phone]

## 2020-03-04 ENCOUNTER — Inpatient Hospital Stay (HOSPITAL_COMMUNITY): Payer: BC Managed Care – PPO

## 2020-03-04 DIAGNOSIS — I472 Ventricular tachycardia: Secondary | ICD-10-CM

## 2020-03-04 DIAGNOSIS — G9340 Encephalopathy, unspecified: Secondary | ICD-10-CM | POA: Diagnosis not present

## 2020-03-04 DIAGNOSIS — G9341 Metabolic encephalopathy: Secondary | ICD-10-CM

## 2020-03-04 LAB — CBC
HCT: 43.7 % (ref 39.0–52.0)
Hemoglobin: 14.9 g/dL (ref 13.0–17.0)
MCH: 32.7 pg (ref 26.0–34.0)
MCHC: 34.1 g/dL (ref 30.0–36.0)
MCV: 95.8 fL (ref 80.0–100.0)
Platelets: 169 10*3/uL (ref 150–400)
RBC: 4.56 MIL/uL (ref 4.22–5.81)
RDW: 11.8 % (ref 11.5–15.5)
WBC: 8.8 10*3/uL (ref 4.0–10.5)
nRBC: 0.2 % (ref 0.0–0.2)

## 2020-03-04 LAB — BASIC METABOLIC PANEL
Anion gap: 15 (ref 5–15)
BUN: 35 mg/dL — ABNORMAL HIGH (ref 8–23)
CO2: 23 mmol/L (ref 22–32)
Calcium: 9.9 mg/dL (ref 8.9–10.3)
Chloride: 109 mmol/L (ref 98–111)
Creatinine, Ser: 0.99 mg/dL (ref 0.61–1.24)
GFR calc Af Amer: >60 mL/min (ref >60)
GFR calc non Af Amer: >60 mL/min (ref >60)
Glucose, Bld: 134 mg/dL — ABNORMAL HIGH (ref 70–99)
Potassium: 4.7 mmol/L (ref 3.5–5.1)
Sodium: 147 mmol/L — ABNORMAL HIGH (ref 135–145)

## 2020-03-04 LAB — GLUCOSE, CAPILLARY
Glucose-Capillary: 114 mg/dL — ABNORMAL HIGH (ref 70–99)
Glucose-Capillary: 100 mg/dL — ABNORMAL HIGH (ref 70–99)
Glucose-Capillary: 139 mg/dL — ABNORMAL HIGH (ref 70–99)
Glucose-Capillary: 123 mg/dL — ABNORMAL HIGH (ref 70–99)
Glucose-Capillary: 126 mg/dL — ABNORMAL HIGH (ref 70–99)
Glucose-Capillary: 182 mg/dL — ABNORMAL HIGH (ref 70–99)
Glucose-Capillary: 146 mg/dL — ABNORMAL HIGH (ref 70–99)
Glucose-Capillary: 251 mg/dL — ABNORMAL HIGH (ref 70–99)

## 2020-03-04 LAB — AMMONIA: Ammonia: 31 umol/L (ref 9–35)

## 2020-03-04 LAB — MAGNESIUM: Magnesium: 2.6 mg/dL — ABNORMAL HIGH (ref 1.7–2.4)

## 2020-03-04 LAB — HEPARIN LEVEL (UNFRACTIONATED): Heparin Unfractionated: 0.62 IU/mL (ref 0.30–0.70)

## 2020-03-04 MED ORDER — LORAZEPAM 2 MG/ML IJ SOLN
1.0000 mg | Freq: Once | INTRAMUSCULAR | Status: AC
  Administered 2020-03-04: 1 mg via INTRAVENOUS
  Filled 2020-03-04: qty 1

## 2020-03-04 MED ORDER — RESOURCE THICKENUP CLEAR PO POWD
ORAL | Status: DC | PRN
  Filled 2020-03-04: qty 125

## 2020-03-04 NOTE — Progress Notes (Signed)
NAME:  Richard Hatfield, MRN:  [id number], DOB:  [birth year], LOS: 5 ADMISSION DATE:  02/28/2020, CONSULTATION DATE: 02/28/2020 referring MD: Cardiology, CHIEF COMPLAINT: Cardiac arrest  Brief History   Patient is a 64 year old status post cardiac arrest, ROSC after 10 minutes.  VDRF, extubated on 7/15.  Left heart cath with diffuse CAD and evidence for vasospasm.  Transferred to floor 7/16 on heparin infusion, empiric antibiotics for possible aspiration pneumonia.  Subsequent course complicated by agitated delirium.  Acute encephalopathy 7/17 a.m. after Ativan.  Back to ICU 7/17, code stroke called.   Past Medical History  No known prior medical history.  Is a non-smoker and drinks occasionally  Significant Hospital Events   Cardiac catheterization 02/28/2020  7/18 - Agitated delirium last 24 hours, concern for possible alcohol withdrawal.  Received Ativan, last dose 0300 7/17.  Became obtunded, increase work of breathing.  Code stroke activated, head CT as above  Consults:  Cardiology PCCM  Procedures:  ET tube 7/13 >> 7/15  Significant Diagnostic Tests:  7/13 echocardiogram>>LVEF 50-55%--no regional wall abn. Normal RV size and function. Mildly elevated PASP. Mild LA dilation. No valvular abnormalities.  7/14 CXR>>increase b/l infiltrates/atelectasis  CT head 7/17 >> no acute bleed, no hydrocephalus, preliminary vascular eval reassuring.  Full perfusion report pending  Micro Data:  COVID-19 7/13 >> negative MRSA screen 7/13 >> negative  Antimicrobials:  Ceftriaxone 7/14 >>   Interim history/subjective:    7/18 - seeb bt SLP - > deemed mild aspiration risk. Per RN - confusion slowly improving. MRI pending today. Still restless and tries to come out of bed. RN does not think he is appropriate for transffer out. Sitter at bedside  Objective   Blood pressure (!) 140/92, pulse 91, temperature 99 F (37.2 C), temperature source Oral, resp. rate (!) 21, height 5\' 8"  (1.727 m), weight  86 kg, SpO2 95 %.        Intake/Output Summary (Last 24 hours) at 03/04/2020 1347 Last data filed at 03/04/2020 1300 Gross per 24 hour  Intake 1297.91 ml  Output 2800 ml  Net -1502.09 ml   Filed Weights   03/01/20 0500 03/02/20 0500 03/04/20 0530  Weight: 94.8 kg 91.7 kg 86 kg    Examination: General Appearance:  Looks deconditined Head:  Normocephalic, without obvious abnormality, atraumatic Eyes:  PERRL - yes, conjunctiva/corneas - muddy     Ears:  Normal external ear canals, both ears Nose:  G tube - no. Has O2 on Throat:  ETT TUBE - no , OG tube - no Neck:  Supple,  No enlargement/tenderness/nodules Lungs: Clear to auscultation bilaterally, Heart:  S1 and S2 normal, no murmur, CVP - no.  Pressors - no Abdomen:  Soft, no masses, no organomegaly Genitalia / Rectal:  Not done Extremities:  Extremities- intact Skin:  ntact in exposed areas . Sacral area - not examined Neurologic:  Sedation - none -> RASS - -2 to +2 . Moves all 4s - yes. CAM-ICU - POSITIVe . Orientation - partial to none      Resolved Hospital Problem list   AKI Acute hypoxic respiratory requiring mechanical ventilation.  Assessment & Plan:   Acute toxic metabolic encephalopathy.  Suspect that this reflects the effects of sedating medication superimposed on his known agitated delirium, probable hypoxic injury from his VF arrest.  Hypertensive 7/17, possible contributor to his encephalopathy. CT head negative 7/17. EEG 7/17 with generalized slowing   Plan  - avoid benzo  - await MRI  ---Appreciate neurology assistance.  Acute hypoxemic respiratory failure.  Principally due to suppressed mental status.   7/18 - remaind extubated. Protecting airay.   Plan  - monitor closely for reintubation risk  OOH Vfib cardiac arrest s/p normothermic TTM--now rewarmed. CAD, diffuse.  plan -ASA, statin, beta-blockade as ordered -Appreciate cardiology assistance, he will be evaluated for suitability for  possible CABG depending on progress, mental status, etc. -PT/OT/SLP  HTN-  -Amlodipine if he is able to take it.  We may need to transition him to IV regimen -Scheduled metoprolol IV, consider transition to per tube  Aspiration pneumonia  7/18  - afebrile   -Plan  - 5 days total ceftriaxone -pulm hygiene      ATTESTATION & SIGNATURE   The patient Richard Hatfield is critically ill with multiple organ systems failure and requires high complexity decision making for assessment and support, frequent evaluation and titration of therapies, application of advanced monitoring technologies and extensive interpretation of multiple databases.   Critical Care Time devoted to patient care services described in this note is  31  Minutes. This time reflects time of care of this signee Dr Brand Males. This critical care time does not reflect procedure time, or teaching time or supervisory time of PA/NP/Med student/Med Resident etc but could involve care discussion time     Dr. Brand Males, M.D., [facility].C.P Pulmonary and Critical Care Medicine Staff Physician [facility] Pager: [phone], If no answer or between  15:00h - 7:00h: call [phone]  03/04/2020 1:47 PM     LABS    PULMONARY Recent Labs  Lab 02/28/20 0223 02/28/20 0506 03/03/20 1025  PHART  --  7.296* 7.492*  PCO2ART  --  44.3 38.9  PO2ART  --  216* 60.1*  HCO3  --  21.6 29.4*  TCO2 23 23  --   O2SAT  --  100.0 89.6    CBC Recent Labs  Lab 03/02/20 0445 03/03/20 0930 03/04/20 0243  HGB 12.4* 14.3 14.9  HCT 37.8* 42.8 43.7  WBC 8.8 9.2 8.8  PLT 183 225 169    COAGULATION Recent Labs  Lab 02/28/20 0126 02/28/20 0543 02/28/20 2006  INR 1.1 1.2 1.1    CARDIAC  No results for input(s): TROPONINI in the last 168 hours. No results for input(s): PROBNP in the last 168 hours.   CHEMISTRY Recent Labs  Lab 02/29/20 0056 02/29/20 0056  03/01/20 0558 03/01/20 0558 03/02/20 0445 03/02/20 0445 03/03/20 0930 03/04/20 0243  NA 143  --  145  --  147*  --  143 147*  K 5.3*   < > 5.0   < > 3.5   < > 3.5 4.7  CL 109  --  108  --  103  --  103 109  CO2 24  --  26  --  29  --  23 23  GLUCOSE 184*  --  139*  --  109*  --  113* 134*  BUN 21  --  24*  --  25*  --  28* 35*  CREATININE 1.08  --  1.01  --  1.01  --  1.02 0.99  CALCIUM 8.1*  --  8.8*  --  9.7  --  9.9 9.9  MG 2.3  --  2.8*  --  2.3  --  2.2 2.6*  PHOS 5.3*  --  3.7  --  2.4*  --  3.4  --    < > =  values in this interval not displayed.   Estimated Creatinine Clearance: 83.6 mL/min (by C-G formula based on SCr of 0.99 mg/dL).   LIVER Recent Labs  Lab 02/28/20 0126 02/28/20 0543 02/28/20 2006 02/29/20 0056  AST 135*  --   --  80*  ALT 100*  --   --  86*  ALKPHOS 41  --   --  27*  BILITOT 0.4  --   --  0.5  PROT 6.6  --   --  6.6  ALBUMIN 3.9  --   --  3.7  INR 1.1 1.2 1.1  --      INFECTIOUS No results for input(s): LATICACIDVEN, PROCALCITON in the last 168 hours.   ENDOCRINE CBG (last 3)  Recent Labs    03/03/20 2302 03/04/20 0302 03/04/20 0710  GLUCAP 139* 123* 126*         IMAGING x48h  - image(s) personally visualized  -   highlighted in bold CT Code Stroke CTA Head W/WO contrast  Result Date: 03/03/2020 CLINICAL DATA:  Aphasia.  Rule out stroke. EXAM: CT ANGIOGRAPHY HEAD AND NECK CT PERFUSION BRAIN TECHNIQUE: Multidetector CT imaging of the head and neck was performed using the standard protocol during bolus administration of intravenous contrast. Multiplanar CT image reconstructions and MIPs were obtained to evaluate the vascular anatomy. Carotid stenosis measurements (when applicable) are obtained utilizing NASCET criteria, using the distal internal carotid diameter as the denominator. Multiphase CT imaging of the brain was performed following IV bolus contrast injection. Subsequent parametric perfusion maps were calculated using  RAPID software. CONTRAST:  141mL OMNIPAQUE IOHEXOL 350 MG/ML SOLN COMPARISON:  CT head 03/03/2020 FINDINGS: FINDINGS CTA NECK FINDINGS Aortic arch: Standard branching. Imaged portion shows no evidence of aneurysm or dissection. No significant stenosis of the major arch vessel origins. Right carotid system: The patient moved during scanning through the neck and head. There is significant artifact of the right carotid bifurcation which is not well evaluated. Right internal carotid artery is patent above the bifurcation. No definite calcification. Left carotid system: Left carotid bifurcation is patent. There is extensive artifact due to motion in the cervical internal carotid artery which is not well evaluated. Distal left cervical internal carotid artery appears normal. Vertebral arteries: Patient moved causing significant artifact. Allowing for this, no vertebral artery abnormality identified. Skeleton: Limited evaluation due to motion. No acute skeletal lesion identified. Other neck: Negative Upper chest: Small left effusion. Mild atelectasis or infiltrate in the right upper lobe posteriorly. Review of the MIP images confirms the above findings CTA HEAD FINDINGS Anterior circulation: Atherosclerotic calcification in the cavernous carotid bilaterally causing mild stenosis. Middle cerebral arteries patent bilaterally without stenosis identified. Anterior cerebral arteries are obscured by motion. Posterior circulation: Both vertebral arteries patent to the basilar. Basilar patent. Posterior cerebral arteries patent bilaterally. Venous sinuses: Normal enhancement Anatomic variants: None Review of the MIP images confirms the above findings CT Brain Perfusion Findings: ASPECTS: 10 CBF (<30%) Volume: 2mL Perfusion (Tmax>6.0s) volume: 19mL Mismatch Volume: 103mL Infarction Location:None IMPRESSION: 1. Images degraded by extensive motion through the carotid bifurcation and through the head. 2. No significant intracranial or  extracranial stenosis. Repeat study recommended if symptoms warrant. 3. CT perfusion negative for acute infarct or ischemia. Electronically Signed   By: Franchot Gallo M.D.   On: 03/03/2020 11:46   CT HEAD WO CONTRAST  Result Date: 03/03/2020 CLINICAL DATA:  Encephalopathy. EXAM: CT HEAD WITHOUT CONTRAST TECHNIQUE: Contiguous axial images were obtained from the base of the skull  through the vertex without intravenous contrast. COMPARISON:  CT head 02/28/2020 FINDINGS: Brain: No evidence of acute infarction, hemorrhage, hydrocephalus, extra-axial collection or mass lesion/mass effect. Vascular: Negative for hyperdense vessel Skull: Negative Sinuses/Orbits: Mild mucosal edema paranasal sinuses. Negative orbit Other: None IMPRESSION: Negative CT head Preliminary report texted to Dr. Rory Percy Electronically Signed   By: Franchot Gallo M.D.   On: 03/03/2020 11:02   CT Code Stroke CTA Neck W/WO contrast  Result Date: 03/03/2020 CLINICAL DATA:  Aphasia.  Rule out stroke. EXAM: CT ANGIOGRAPHY HEAD AND NECK CT PERFUSION BRAIN TECHNIQUE: Multidetector CT imaging of the head and neck was performed using the standard protocol during bolus administration of intravenous contrast. Multiplanar CT image reconstructions and MIPs were obtained to evaluate the vascular anatomy. Carotid stenosis measurements (when applicable) are obtained utilizing NASCET criteria, using the distal internal carotid diameter as the denominator. Multiphase CT imaging of the brain was performed following IV bolus contrast injection. Subsequent parametric perfusion maps were calculated using RAPID software. CONTRAST:  169mL OMNIPAQUE IOHEXOL 350 MG/ML SOLN COMPARISON:  CT head 03/03/2020 FINDINGS: FINDINGS CTA NECK FINDINGS Aortic arch: Standard branching. Imaged portion shows no evidence of aneurysm or dissection. No significant stenosis of the major arch vessel origins. Right carotid system: The patient moved during scanning through the neck and  head. There is significant artifact of the right carotid bifurcation which is not well evaluated. Right internal carotid artery is patent above the bifurcation. No definite calcification. Left carotid system: Left carotid bifurcation is patent. There is extensive artifact due to motion in the cervical internal carotid artery which is not well evaluated. Distal left cervical internal carotid artery appears normal. Vertebral arteries: Patient moved causing significant artifact. Allowing for this, no vertebral artery abnormality identified. Skeleton: Limited evaluation due to motion. No acute skeletal lesion identified. Other neck: Negative Upper chest: Small left effusion. Mild atelectasis or infiltrate in the right upper lobe posteriorly. Review of the MIP images confirms the above findings CTA HEAD FINDINGS Anterior circulation: Atherosclerotic calcification in the cavernous carotid bilaterally causing mild stenosis. Middle cerebral arteries patent bilaterally without stenosis identified. Anterior cerebral arteries are obscured by motion. Posterior circulation: Both vertebral arteries patent to the basilar. Basilar patent. Posterior cerebral arteries patent bilaterally. Venous sinuses: Normal enhancement Anatomic variants: None Review of the MIP images confirms the above findings CT Brain Perfusion Findings: ASPECTS: 10 CBF (<30%) Volume: 48mL Perfusion (Tmax>6.0s) volume: 7mL Mismatch Volume: 10mL Infarction Location:None IMPRESSION: 1. Images degraded by extensive motion through the carotid bifurcation and through the head. 2. No significant intracranial or extracranial stenosis. Repeat study recommended if symptoms warrant. 3. CT perfusion negative for acute infarct or ischemia. Electronically Signed   By: Franchot Gallo M.D.   On: 03/03/2020 11:46   CT Code Stroke Cerebral Perfusion with contrast  Result Date: 03/03/2020 CLINICAL DATA:  Aphasia.  Rule out stroke. EXAM: CT ANGIOGRAPHY HEAD AND NECK CT PERFUSION  BRAIN TECHNIQUE: Multidetector CT imaging of the head and neck was performed using the standard protocol during bolus administration of intravenous contrast. Multiplanar CT image reconstructions and MIPs were obtained to evaluate the vascular anatomy. Carotid stenosis measurements (when applicable) are obtained utilizing NASCET criteria, using the distal internal carotid diameter as the denominator. Multiphase CT imaging of the brain was performed following IV bolus contrast injection. Subsequent parametric perfusion maps were calculated using RAPID software. CONTRAST:  158mL OMNIPAQUE IOHEXOL 350 MG/ML SOLN COMPARISON:  CT head 03/03/2020 FINDINGS: FINDINGS CTA NECK FINDINGS Aortic arch: Standard branching. Imaged  portion shows no evidence of aneurysm or dissection. No significant stenosis of the major arch vessel origins. Right carotid system: The patient moved during scanning through the neck and head. There is significant artifact of the right carotid bifurcation which is not well evaluated. Right internal carotid artery is patent above the bifurcation. No definite calcification. Left carotid system: Left carotid bifurcation is patent. There is extensive artifact due to motion in the cervical internal carotid artery which is not well evaluated. Distal left cervical internal carotid artery appears normal. Vertebral arteries: Patient moved causing significant artifact. Allowing for this, no vertebral artery abnormality identified. Skeleton: Limited evaluation due to motion. No acute skeletal lesion identified. Other neck: Negative Upper chest: Small left effusion. Mild atelectasis or infiltrate in the right upper lobe posteriorly. Review of the MIP images confirms the above findings CTA HEAD FINDINGS Anterior circulation: Atherosclerotic calcification in the cavernous carotid bilaterally causing mild stenosis. Middle cerebral arteries patent bilaterally without stenosis identified. Anterior cerebral arteries are  obscured by motion. Posterior circulation: Both vertebral arteries patent to the basilar. Basilar patent. Posterior cerebral arteries patent bilaterally. Venous sinuses: Normal enhancement Anatomic variants: None Review of the MIP images confirms the above findings CT Brain Perfusion Findings: ASPECTS: 10 CBF (<30%) Volume: 52mL Perfusion (Tmax>6.0s) volume: 15mL Mismatch Volume: 17mL Infarction Location:None IMPRESSION: 1. Images degraded by extensive motion through the carotid bifurcation and through the head. 2. No significant intracranial or extracranial stenosis. Repeat study recommended if symptoms warrant. 3. CT perfusion negative for acute infarct or ischemia. Electronically Signed   By: Franchot Gallo M.D.   On: 03/03/2020 11:46   DG Chest Port 1 View  Result Date: 03/03/2020 CLINICAL DATA:  Acute respiratory distress. EXAM: PORTABLE CHEST 1 VIEW COMPARISON:  03/01/2020 FINDINGS: Borderline enlarged cardiac silhouette with an interval decrease in size. The endotracheal tube and nasogastric tube have been removed. Mildly decreased prominence of the interstitial markings with resolved left lung airspace opacities and right lung linear densities. Unremarkable bones. IMPRESSION: 1. Resolved left lung alveolar edema and right lung atelectasis. 2. Improved probable combination of interstitial pulmonary edema and chronic interstitial lung disease. Electronically Signed   By: Claudie Revering M.D.   On: 03/03/2020 10:57   EEG adult  Result Date: 03/03/2020 Lora Havens, MD     03/03/2020  1:50 PM Patient Name: Zachory Mangual MRN: [id number] Epilepsy Attending: Lora Havens Referring Physician/Provider: Dr Amie [place] Date: 7/08/25/2019 Duration: 23.08 mins Patient history: 64 year old past history of diabetes, hypertension, with no known coronary disease brought in after cardiac arrest requiring 10 minutes of CPR. Had worsening mental status today. EEG to evaluate for seizure. Level of alertness: comatose  AEDs during EEG study: None Technical aspects: This EEG study was done with scalp electrodes positioned according to the 10-20 International system of electrode placement. Electrical activity was acquired at a sampling rate of 500Hz  and reviewed with a high frequency filter of 70Hz  and a low frequency filter of 1Hz . EEG data were recorded continuously and digitally stored. Description: EEG showed continuous generalized 5-8 Hz theta slowing as well as 2-3Hz  delta slowing. Hyperventilation and photic stimulation were not performed.   ABNORMALITY -Continuous slow, generalized IMPRESSION: This study is suggestive of severe diffuse encephalopathy, nonspecific etiology. No seizures or epileptiform discharges were seen throughout the recording. Priyanka [name]

## 2020-03-04 NOTE — Progress Notes (Signed)
Progress Note  Patient Name: Richard Hatfield Date of Encounter: 03/04/2020  [facility] Cardiologist:  Claiborne Billings  Subjective   Events of yesterday noted. Confused and restless all night. Now asleep. Nurse reports oriented to self this am.   Inpatient Medications    Scheduled Meds: . aspirin  81 mg Oral Daily  . Chlorhexidine Gluconate Cloth  6 each Topical Daily  . hydrALAZINE  20 mg Intravenous Q8H  . insulin aspart  0-9 Units Subcutaneous Q4H  . mouth rinse  15 mL Mouth Rinse BID  . metoprolol tartrate  5 mg Intravenous Q6H  . polyethylene glycol  17 g Oral Daily  . senna-docusate  1 tablet Oral BID  . sodium chloride flush  3 mL Intravenous Q12H   Continuous Infusions: . sodium chloride 10 mL/hr at 02/28/20 0144  . sodium chloride 10 mL/hr at 03/01/20 1018  . cefTRIAXone (ROCEPHIN)  IV Stopped (03/03/20 1238)  . famotidine (PEPCID) IV 20 mg (03/03/20 2221)  . heparin 1,700 Units/hr (03/04/20 0800)   PRN Meds: sodium chloride, acetaminophen (TYLENOL) oral liquid 160 mg/5 mL, diphenhydrAMINE, labetalol, ondansetron (ZOFRAN) IV, sodium chloride flush   Vital Signs    Vitals:   03/04/20 0430 03/04/20 0530 03/04/20 0713 03/04/20 0800  BP: 138/77   (!) 176/93  Pulse: 91   77  Resp: (!) 22   19  Temp:   99.1 F (37.3 C)   TempSrc:      SpO2: 91%   97%  Weight:  86 kg    Height:        Intake/Output Summary (Last 24 hours) at 03/04/2020 0815 Last data filed at 03/04/2020 0800 Gross per 24 hour  Intake 962.04 ml  Output 3250 ml  Net -2287.96 ml   Last 3 Weights 03/04/2020 03/02/2020 03/01/2020  Weight (lbs) 189 lb 9.5 oz 202 lb 2.6 oz 208 lb 15.9 oz  Weight (kg) 86 kg 91.7 kg 94.8 kg      Telemetry    Sinus, PVCs with couplets, triplet - Personally Reviewed  ECG    No AM EKG - Personally Reviewed  Physical Exam   General: Well developed, well nourished, NAD  HEENT: normal SKIN: warm, dry. No rashes. Neuro: No focal deficits  Musculoskeletal: moves  spontaneously. Hands in mitts.  Psychiatric: unable to assess.  Neck: No JVD Lungs:Clear bilaterally, no wheezes, rhonci, crackles Cardiovascular: Regular rate and rhythm. No murmurs, gallops or rubs. Abdomen:Soft. Bowel sounds present. Non-tender.  Extremities: No lower extremity edema. Pulses are 2 + in the bilateral DP/PT.  Labs    High Sensitivity Troponin:   Recent Labs  Lab 02/28/20 0126 02/28/20 0543  TROPONINIHS 60* 992*      Chemistry Recent Labs  Lab 02/28/20 0126 02/28/20 0223 02/29/20 0056 03/01/20 0558 03/02/20 0445 03/03/20 0930 03/04/20 0243  NA 137   < > 143   < > 147* 143 147*  K 3.8   < > 5.3*   < > 3.5 3.5 4.7  CL 105   < > 109   < > 103 103 109  CO2 15*   < > 24   < > 29 23 23   GLUCOSE 268*   < > 184*   < > 109* 113* 134*  BUN 20   < > 21   < > 25* 28* 35*  CREATININE 1.45*   < > 1.08   < > 1.01 1.02 0.99  CALCIUM 8.7*   < > 8.1*   < > 9.7  9.9 9.9  PROT 6.6  --  6.6  --   --   --   --   ALBUMIN 3.9  --  3.7  --   --   --   --   AST 135*  --  80*  --   --   --   --   ALT 100*  --  86*  --   --   --   --   ALKPHOS 41  --  27*  --   --   --   --   BILITOT 0.4  --  0.5  --   --   --   --   GFRNONAA 52*   < > >60   < > >60 >60 >60  GFRAA 60*   < > >60   < > >60 >60 >60  ANIONGAP 17*   < > 10   < > 15 17* 15   < > = values in this interval not displayed.     Hematology Recent Labs  Lab 03/02/20 0445 03/03/20 0930 03/04/20 0243  WBC 8.8 9.2 8.8  RBC 3.90* 4.47 4.56  HGB 12.4* 14.3 14.9  HCT 37.8* 42.8 43.7  MCV 96.9 95.7 95.8  MCH 31.8 32.0 32.7  MCHC 32.8 33.4 34.1  RDW 12.0 11.6 11.8  PLT 183 225 169    BNPNo results for input(s): BNP, PROBNP in the last 168 hours.   DDimer No results for input(s): DDIMER in the last 168 hours.   Radiology    CT Code Stroke CTA Head W/WO contrast  Result Date: 03/03/2020 CLINICAL DATA:  Aphasia.  Rule out stroke. EXAM: CT ANGIOGRAPHY HEAD AND NECK CT PERFUSION BRAIN TECHNIQUE: Multidetector CT  imaging of the head and neck was performed using the standard protocol during bolus administration of intravenous contrast. Multiplanar CT image reconstructions and MIPs were obtained to evaluate the vascular anatomy. Carotid stenosis measurements (when applicable) are obtained utilizing NASCET criteria, using the distal internal carotid diameter as the denominator. Multiphase CT imaging of the brain was performed following IV bolus contrast injection. Subsequent parametric perfusion maps were calculated using RAPID software. CONTRAST:  126mL OMNIPAQUE IOHEXOL 350 MG/ML SOLN COMPARISON:  CT head 03/03/2020 FINDINGS: FINDINGS CTA NECK FINDINGS Aortic arch: Standard branching. Imaged portion shows no evidence of aneurysm or dissection. No significant stenosis of the major arch vessel origins. Right carotid system: The patient moved during scanning through the neck and head. There is significant artifact of the right carotid bifurcation which is not well evaluated. Right internal carotid artery is patent above the bifurcation. No definite calcification. Left carotid system: Left carotid bifurcation is patent. There is extensive artifact due to motion in the cervical internal carotid artery which is not well evaluated. Distal left cervical internal carotid artery appears normal. Vertebral arteries: Patient moved causing significant artifact. Allowing for this, no vertebral artery abnormality identified. Skeleton: Limited evaluation due to motion. No acute skeletal lesion identified. Other neck: Negative Upper chest: Small left effusion. Mild atelectasis or infiltrate in the right upper lobe posteriorly. Review of the MIP images confirms the above findings CTA HEAD FINDINGS Anterior circulation: Atherosclerotic calcification in the cavernous carotid bilaterally causing mild stenosis. Middle cerebral arteries patent bilaterally without stenosis identified. Anterior cerebral arteries are obscured by motion. Posterior  circulation: Both vertebral arteries patent to the basilar. Basilar patent. Posterior cerebral arteries patent bilaterally. Venous sinuses: Normal enhancement Anatomic variants: None Review of the MIP images confirms the above findings CT Brain  Perfusion Findings: ASPECTS: 10 CBF (<30%) Volume: 23mL Perfusion (Tmax>6.0s) volume: 47mL Mismatch Volume: 33mL Infarction Location:None IMPRESSION: 1. Images degraded by extensive motion through the carotid bifurcation and through the head. 2. No significant intracranial or extracranial stenosis. Repeat study recommended if symptoms warrant. 3. CT perfusion negative for acute infarct or ischemia. Electronically Signed   By: Franchot Gallo M.D.   On: 03/03/2020 11:46   CT HEAD WO CONTRAST  Result Date: 03/03/2020 CLINICAL DATA:  Encephalopathy. EXAM: CT HEAD WITHOUT CONTRAST TECHNIQUE: Contiguous axial images were obtained from the base of the skull through the vertex without intravenous contrast. COMPARISON:  CT head 02/28/2020 FINDINGS: Brain: No evidence of acute infarction, hemorrhage, hydrocephalus, extra-axial collection or mass lesion/mass effect. Vascular: Negative for hyperdense vessel Skull: Negative Sinuses/Orbits: Mild mucosal edema paranasal sinuses. Negative orbit Other: None IMPRESSION: Negative CT head Preliminary report texted to Dr. Rory Percy Electronically Signed   By: Franchot Gallo M.D.   On: 03/03/2020 11:02   CT Code Stroke CTA Neck W/WO contrast  Result Date: 03/03/2020 CLINICAL DATA:  Aphasia.  Rule out stroke. EXAM: CT ANGIOGRAPHY HEAD AND NECK CT PERFUSION BRAIN TECHNIQUE: Multidetector CT imaging of the head and neck was performed using the standard protocol during bolus administration of intravenous contrast. Multiplanar CT image reconstructions and MIPs were obtained to evaluate the vascular anatomy. Carotid stenosis measurements (when applicable) are obtained utilizing NASCET criteria, using the distal internal carotid diameter as the  denominator. Multiphase CT imaging of the brain was performed following IV bolus contrast injection. Subsequent parametric perfusion maps were calculated using RAPID software. CONTRAST:  173mL OMNIPAQUE IOHEXOL 350 MG/ML SOLN COMPARISON:  CT head 03/03/2020 FINDINGS: FINDINGS CTA NECK FINDINGS Aortic arch: Standard branching. Imaged portion shows no evidence of aneurysm or dissection. No significant stenosis of the major arch vessel origins. Right carotid system: The patient moved during scanning through the neck and head. There is significant artifact of the right carotid bifurcation which is not well evaluated. Right internal carotid artery is patent above the bifurcation. No definite calcification. Left carotid system: Left carotid bifurcation is patent. There is extensive artifact due to motion in the cervical internal carotid artery which is not well evaluated. Distal left cervical internal carotid artery appears normal. Vertebral arteries: Patient moved causing significant artifact. Allowing for this, no vertebral artery abnormality identified. Skeleton: Limited evaluation due to motion. No acute skeletal lesion identified. Other neck: Negative Upper chest: Small left effusion. Mild atelectasis or infiltrate in the right upper lobe posteriorly. Review of the MIP images confirms the above findings CTA HEAD FINDINGS Anterior circulation: Atherosclerotic calcification in the cavernous carotid bilaterally causing mild stenosis. Middle cerebral arteries patent bilaterally without stenosis identified. Anterior cerebral arteries are obscured by motion. Posterior circulation: Both vertebral arteries patent to the basilar. Basilar patent. Posterior cerebral arteries patent bilaterally. Venous sinuses: Normal enhancement Anatomic variants: None Review of the MIP images confirms the above findings CT Brain Perfusion Findings: ASPECTS: 10 CBF (<30%) Volume: 28mL Perfusion (Tmax>6.0s) volume: 41mL Mismatch Volume: 30mL  Infarction Location:None IMPRESSION: 1. Images degraded by extensive motion through the carotid bifurcation and through the head. 2. No significant intracranial or extracranial stenosis. Repeat study recommended if symptoms warrant. 3. CT perfusion negative for acute infarct or ischemia. Electronically Signed   By: Franchot Gallo M.D.   On: 03/03/2020 11:46   CT Code Stroke Cerebral Perfusion with contrast  Result Date: 03/03/2020 CLINICAL DATA:  Aphasia.  Rule out stroke. EXAM: CT ANGIOGRAPHY HEAD AND NECK CT PERFUSION  BRAIN TECHNIQUE: Multidetector CT imaging of the head and neck was performed using the standard protocol during bolus administration of intravenous contrast. Multiplanar CT image reconstructions and MIPs were obtained to evaluate the vascular anatomy. Carotid stenosis measurements (when applicable) are obtained utilizing NASCET criteria, using the distal internal carotid diameter as the denominator. Multiphase CT imaging of the brain was performed following IV bolus contrast injection. Subsequent parametric perfusion maps were calculated using RAPID software. CONTRAST:  148mL OMNIPAQUE IOHEXOL 350 MG/ML SOLN COMPARISON:  CT head 03/03/2020 FINDINGS: FINDINGS CTA NECK FINDINGS Aortic arch: Standard branching. Imaged portion shows no evidence of aneurysm or dissection. No significant stenosis of the major arch vessel origins. Right carotid system: The patient moved during scanning through the neck and head. There is significant artifact of the right carotid bifurcation which is not well evaluated. Right internal carotid artery is patent above the bifurcation. No definite calcification. Left carotid system: Left carotid bifurcation is patent. There is extensive artifact due to motion in the cervical internal carotid artery which is not well evaluated. Distal left cervical internal carotid artery appears normal. Vertebral arteries: Patient moved causing significant artifact. Allowing for this, no  vertebral artery abnormality identified. Skeleton: Limited evaluation due to motion. No acute skeletal lesion identified. Other neck: Negative Upper chest: Small left effusion. Mild atelectasis or infiltrate in the right upper lobe posteriorly. Review of the MIP images confirms the above findings CTA HEAD FINDINGS Anterior circulation: Atherosclerotic calcification in the cavernous carotid bilaterally causing mild stenosis. Middle cerebral arteries patent bilaterally without stenosis identified. Anterior cerebral arteries are obscured by motion. Posterior circulation: Both vertebral arteries patent to the basilar. Basilar patent. Posterior cerebral arteries patent bilaterally. Venous sinuses: Normal enhancement Anatomic variants: None Review of the MIP images confirms the above findings CT Brain Perfusion Findings: ASPECTS: 10 CBF (<30%) Volume: 2mL Perfusion (Tmax>6.0s) volume: 60mL Mismatch Volume: 77mL Infarction Location:None IMPRESSION: 1. Images degraded by extensive motion through the carotid bifurcation and through the head. 2. No significant intracranial or extracranial stenosis. Repeat study recommended if symptoms warrant. 3. CT perfusion negative for acute infarct or ischemia. Electronically Signed   By: Franchot Gallo M.D.   On: 03/03/2020 11:46   DG Chest Port 1 View  Result Date: 03/03/2020 CLINICAL DATA:  Acute respiratory distress. EXAM: PORTABLE CHEST 1 VIEW COMPARISON:  03/01/2020 FINDINGS: Borderline enlarged cardiac silhouette with an interval decrease in size. The endotracheal tube and nasogastric tube have been removed. Mildly decreased prominence of the interstitial markings with resolved left lung airspace opacities and right lung linear densities. Unremarkable bones. IMPRESSION: 1. Resolved left lung alveolar edema and right lung atelectasis. 2. Improved probable combination of interstitial pulmonary edema and chronic interstitial lung disease. Electronically Signed   By: Claudie Revering  M.D.   On: 03/03/2020 10:57   EEG adult  Result Date: 03/03/2020 Lora Havens, MD     03/03/2020  1:50 PM Patient Name: Octavio Matheney MRN: [id number] Epilepsy Attending: Lora Havens Referring Physician/Provider: Dr Amie [place] Date: 7/08/25/2019 Duration: 23.08 mins Patient history: 64 year old past history of diabetes, hypertension, with no known coronary disease brought in after cardiac arrest requiring 10 minutes of CPR. Had worsening mental status today. EEG to evaluate for seizure. Level of alertness: comatose AEDs during EEG study: None Technical aspects: This EEG study was done with scalp electrodes positioned according to the 10-20 International system of electrode placement. Electrical activity was acquired at a sampling rate of 500Hz  and reviewed with a high frequency filter of  70Hz  and a low frequency filter of 1Hz . EEG data were recorded continuously and digitally stored. Description: EEG showed continuous generalized 5-8 Hz theta slowing as well as 2-3Hz  delta slowing. Hyperventilation and photic stimulation were not performed.   ABNORMALITY -Continuous slow, generalized IMPRESSION: This study is suggestive of severe diffuse encephalopathy, nonspecific etiology. No seizures or epileptiform discharges were seen throughout the recording. Lora Havens    Cardiac Studies   Echo 02/28/20:  1. Normal wall motion in visualized segments. Mid to distal anterolateral  wall not well visualized on apical images. Consider repeat with echo  contrast for wall motion when patient less agitated.. Left ventricular  ejection fraction, by estimation, is 50  to 55%. The left ventricle has low normal function. The left ventricle  has no regional wall motion abnormalities. There is mild concentric left  ventricular hypertrophy. Left ventricular diastolic parameters were  normal.  2. Right ventricular systolic function is normal. The right ventricular  size is normal. There is mildly elevated  pulmonary artery systolic  pressure.  3. Left atrial size was mildly dilated.  4. The mitral valve is normal in structure. Trivial mitral valve  regurgitation. No evidence of mitral stenosis.  5. The aortic valve has an indeterminant number of cusps. Aortic valve  regurgitation is not visualized. Mild aortic valve stenosis.   LEFT HEART CATH AND CORONARY ANGIOGRAPHY  Conclusion    Prox LAD to Mid LAD lesion is 60% stenosed.  1st Diag lesion is 75% stenosed.  Mid LAD lesion is 80% stenosed.  Prox Cx lesion is 70% stenosed.  Prox Cx to Mid Cx lesion is 80% stenosed.  Prox RCA-1 lesion is 20% stenosed.  Prox RCA-2 lesion is 70% stenosed.  RV Branch lesion is 90% stenosed.  Dist RCA-1 lesion is 80% stenosed.  Dist RCA-2 lesion is 85% stenosed.  RPDA lesion is 30% stenosed.  The left ventricular ejection fraction is 45-50% by visual estimate.  LV end diastolic pressure is normal.  There is mild left ventricular systolic dysfunction.   Out of hospital witnessed VF cardiac arrest with return of ROSC after approximately 20 minutes of CPR and administration of 2 doses of epinephrine.  Significant three-vessel CAD with 60% diffuse proximal LAD stenosis, long diffuse 70% diagonal stenosis and 80% LAD stenosis after the first diagonal vessel; 70 to 80% proximal diffuse circumflex stenosis before a large marginal branch; and very large dominant RCA with 70% proximal stenosis and long diffuse 80 and 85% stenoses beyond the acute margin proximal to the PDA takeoff with mild 30% narrowing in the PDA.  Mild acute LV dysfunction with focal anterolateral hypocontractility and EF estimated 45 to 50%. LVEDP 12 mm  RECOMMENDATION: Suspect transient coronary vasospasm involving the LAD circulation in the etiology of the patient's VF cardiac arrest. Low-dose IV nitroglycerin was started at the end of the catheterization procedure. Patient will be transported to to heart and evaluated  by critical care with consideration for hypothermia due to witnessed cardiac arrest. A 2D echo Doppler study will be obtained. We will review angios with colleagues but with diffuse multivessel CAD in this diabetic male consider possible surgical consultation for CABG revascularization following stability.    Patient Profile     64 y.o. male with out of hospital cardiac arrest. 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 No PCI performed  Assessment & Plan    1. Cardiac arrest with severe three vessel CAD: LV function preserved on echo.  I would continue ASA an  statin. He will need consideration for CABG once his neurological recovery progresses. Now with persistent encephalopathy. Unable to take PO currently. Should continue metoprolol IV and can give ASA PR.  2. NSVT: on IV metoprolol  3. Encephalopathy. Hypoxic/metabolic. Plans per CCM/Neuro.     For questions or updates, please contact [place] Please consult [email or web address] for contact info under        Signed, Dorethia Jeanmarie Martinique, MD  03/04/2020, 8:15 AM

## 2020-03-04 NOTE — Progress Notes (Signed)
Neurology Progress Note   S:// Seen and examined. Transferred to the ICU after yesterday's episode on the floor where he was less responsive. Appeared to be somewhat confused and agitated overnight.   O:// Current vital signs: BP (!) 147/87   Pulse 95   Temp 99.1 F (37.3 C)   Resp (!) 26   Ht 5\' 8"  (1.727 m)   Wt 86 kg   SpO2 96%   BMI 28.83 kg/m  Vital signs in last 24 hours: Temp:  [98.6 F (37 C)-99.5 F (37.5 C)] 99.1 F (37.3 C) (07/18 0713) Pulse Rate:  [67-101] 95 (07/18 1000) Resp:  [14-29] 26 (07/18 1000) BP: (138-184)/(73-115) 147/87 (07/18 1000) SpO2:  [91 %-100 %] 96 % (07/18 1000) Weight:  [86 kg] 86 kg (07/18 0530) Neurological exam Mental status, speech and language: Patient is comfortably sitting in the bed with a sitter at the bedside. He was sleeping and open his eyes to voice. He was able to tell me his name, he was not aware of the day today.  He was able to tell me he is in the hospital. His speech is dysarthric-with the caveat that his mouth is extremely dry. He was able to name simple objects He was able to repeat without difficulty He is fluent in his speech Cranial nerves: Pupils equal round react light, extraocular movements intact, visual fields full, face appears symmetric, auditory daily intact, tongue and palate midline Motor exam: He is antigravity in all 4 extremities.  I did not appreciate florid asterixis. Sensory exam: Intact light touch all over Coordination: Difficult to assess but no obvious dysmetria noted  Medications  Current Facility-Administered Medications:  .  0.9 %  sodium chloride infusion, , Intravenous, Continuous, Delora Fuel, MD, Last Rate: 10 mL/hr at 02/28/20 0144, New Bag at 02/28/20 0144 .  0.9 %  sodium chloride infusion, 250 mL, Intravenous, PRN, Troy Sine, MD, Last Rate: 10 mL/hr at 03/01/20 1018, IV Pump Association at 03/01/20 1018 .  acetaminophen (TYLENOL) 160 MG/5ML solution 650 mg, 650 mg, Per  Tube, Q4H PRN, Shellia Cleverly, MD, 650 mg at 03/01/20 0814 .  aspirin chewable tablet 81 mg, 81 mg, Oral, Daily, Candee Furbish, MD, 81 mg at 03/04/20 3474 .  Chlorhexidine Gluconate Cloth 2 % PADS 6 each, 6 each, Topical, Daily, Candee Furbish, MD, 6 each at 03/04/20 0900 .  diphenhydrAMINE (BENADRYL) injection 25 mg, 25 mg, Intravenous, QHS PRN, Bowser, Grace E, NP .  famotidine (PEPCID) IVPB 20 mg premix, 20 mg, Intravenous, Q12H, Elsie Lincoln, MD, Last Rate: 100 mL/hr at 03/04/20 0908, 20 mg at 03/04/20 0908 .  heparin ADULT infusion 100 units/mL (25000 units/222mL sodium chloride 0.45%), 1,700 Units/hr, Intravenous, Continuous, Byrum, Rose Fillers, MD, Last Rate: 17 mL/hr at 03/04/20 1000, 1,700 Units/hr at 03/04/20 1000 .  hydrALAZINE (APRESOLINE) injection 20 mg, 20 mg, Intravenous, Q8H, Bowser, Grace E, NP, 20 mg at 03/04/20 0908 .  insulin aspart (novoLOG) injection 0-9 Units, 0-9 Units, Subcutaneous, Q4H, Candee Furbish, MD, 1 Units at 03/04/20 [id number] .  labetalol (NORMODYNE) injection 10 mg, 10 mg, Intravenous, Q2H PRN, Bowser, Grace E, NP, 10 mg at 03/03/20 2225 .  MEDLINE mouth rinse, 15 mL, Mouth Rinse, BID, Candee Furbish, MD, 15 mL at 03/04/20 0851 .  metoprolol tartrate (LOPRESSOR) injection 5 mg, 5 mg, Intravenous, Q6H, Bowser, Grace E, NP, 5 mg at 03/04/20 0535 .  ondansetron (ZOFRAN) injection 4 mg, 4 mg, Intravenous, Q6H PRN, Shelva Majestic  A, MD .  polyethylene glycol (MIRALAX / GLYCOLAX) packet 17 g, 17 g, Oral, Daily, Byrum, Rose Fillers, MD .  Resource ThickenUp Clear, , Oral, PRN, Brand Males, MD .  senna-docusate (Senokot-S) tablet 1 tablet, 1 tablet, Oral, BID, Byrum, Rose Fillers, MD .  sodium chloride flush (NS) 0.9 % injection 3 mL, 3 mL, Intravenous, Q12H, Troy Sine, MD, 3 mL at 03/04/20 0853 .  sodium chloride flush (NS) 0.9 % injection 3 mL, 3 mL, Intravenous, PRN, Troy Sine, MD Labs CBC    Component Value Date/Time   WBC 8.8 03/04/2020 0243   RBC 4.56  03/04/2020 0243   HGB 14.9 03/04/2020 0243   HCT 43.7 03/04/2020 0243   PLT 169 03/04/2020 0243   MCV 95.8 03/04/2020 0243   MCH 32.7 03/04/2020 0243   MCHC 34.1 03/04/2020 0243   RDW 11.8 03/04/2020 0243   LYMPHSABS 5.7 (H) 02/28/2020 0126   MONOABS 0.9 02/28/2020 0126   EOSABS 0.2 02/28/2020 0126   BASOSABS 0.1 02/28/2020 0126    CMP     Component Value Date/Time   NA 147 (H) 03/04/2020 0243   K 4.7 03/04/2020 0243   CL 109 03/04/2020 0243   CO2 23 03/04/2020 0243   GLUCOSE 134 (H) 03/04/2020 0243   BUN 35 (H) 03/04/2020 0243   CREATININE 0.99 03/04/2020 0243   CALCIUM 9.9 03/04/2020 0243   PROT 6.6 02/29/2020 0056   ALBUMIN 3.7 02/29/2020 0056   AST 80 (H) 02/29/2020 0056   ALT 86 (H) 02/29/2020 0056   ALKPHOS 27 (L) 02/29/2020 0056   BILITOT 0.5 02/29/2020 0056   GFRNONAA >60 03/04/2020 0243   GFRAA >60 03/04/2020 0243  Ammonia was mildly elevated at 46 yesterday, is normal today at 31. B12 951 TSH 2.920  Imaging I have reviewed images in epic and the results pertinent to this consultation are: CT head yesterday-no acute changes MRI examination of the brain-pending  EEG: Severe diffuse encephalopathy nonspecific in etiology.  No seizures or epileptiform discharges seen throughout the recording.  Assessment: 64 year old past history of diabetes, hypertension, no known coronary artery disease brought in after a V. tach cardiac arrest requiring 10 minutes of CPR with cardioversion and 2 rounds of epinephrine.  Emergent cath only found mild disease with diffuse vasospasm.  Transferred to the floor from cardiac ICU the day before, received some Ativan yesterday morning and late in the morning a code stroke was activated for drowsiness and inability to communicate-decreased level of consciousness with concern for stroke versus ICH since he is on heparin drip for NSTEMI. Nonfocal on exam.  CT CTA unremarkable for any evidence of evolving infarct or E LVO. Transferred back  to the 2 heart ICU for closer monitoring. Overnight somewhat agitated but following all commands. On my examination, has poor attention concentration but follows all commands and is nonfocal otherwise.  Exam consistent with encephalopathy.  EEG unremarkable for seizures but shows diffuse severe encephalopathy. Suspect he might have some hypoxic brain damage if not frank anoxic injury as he is awake and following commands.  EEG unremarkable for seizures. Differentials include: Hypoxic ischemic encephalopathy versus toxic metabolic encephalopathy.  Recommendations: Supportive care per critical care team as you are. MRI of the brain when able to-can use low-dose of Ativan 1 mg IV x1 for the MRI if needed. Use sedating medications very cautiously as he responded very strongly to Ativan and was nearly unresponsive after receiving a 2 mg dose yesterday morning. Check daily labs.  Correction of toxic metabolic derangements per primary team as you are.  We will follow with you.  Spoke with the wife and updated her.   -- Amie Portland, MD [facility] Pager: [phone] If 7pm to 7am, please call on call as listed on AMION.  CRITICAL CARE ATTESTATION Performed by: Amie Portland, MD Total critical care time: 29 minutes Critical care time was exclusive of separately billable procedures and treating other patients and/or supervising APPs/Residents/Students Critical care was necessary to treat or prevent imminent or life-threatening deterioration due to hypoxic ischemic encephalopathy versus toxic metabolic encephalopathy This patient is critically ill and at significant risk for neurological worsening and/or death and care requires constant monitoring. Critical care was time spent personally by me on the following activities: development of treatment plan with patient and/or surrogate as well as nursing, discussions with consultants, evaluation of patient's response to treatment,  examination of patient, obtaining history from patient or surrogate, ordering and performing treatments and interventions, ordering and review of laboratory studies, ordering and review of radiographic studies, pulse oximetry, re-evaluation of patient's condition, participation in multidisciplinary rounds and medical decision making of high complexity in the care of this patient.

## 2020-03-04 NOTE — Progress Notes (Signed)
ANTICOAGULATION CONSULT NOTE  Pharmacy Consult for heparin Indication: CAD awaiting CVTS consult  No Known Allergies  Patient Measurements: Height: 5\' 8"  (172.7 cm) Weight: 86 kg (189 lb 9.5 oz) IBW/kg (Calculated) : 68.4 Heparin Dosing Weight: 90kg  Vital Signs: Temp: 99 F (37.2 C) (07/18 1200) Temp Source: Oral (07/18 1200) BP: 140/92 (07/18 1300) Pulse Rate: 91 (07/18 1300)  Labs: Recent Labs    03/02/20 0445 03/02/20 0445 03/03/20 0930 03/04/20 0243  HGB 12.4*   < > 14.3 14.9  HCT 37.8*  --  42.8 43.7  PLT 183  --  225 169  HEPARINUNFRC 0.30  --  0.38 0.62  CREATININE 1.01  --  1.02 0.99   < > = values in this interval not displayed.    Estimated Creatinine Clearance: 83.6 mL/min (by C-G formula based on SCr of 0.99 mg/dL).   Assessment: 64yo male had witnessed VF arrest, called as code STEMI and sent emergently to cath lab which revealed diffuse multivessel CAD >> awaiting CVTS consult.  Heparin resumed post-cath.  With mental status changes on 7/17, heparin was stopped for CT to r/o head bleed. Per MD ok to resume heparin but since event 5 days ago and no CP - awaiting CABG will keep toward lower end goal  Heparin level jumped from 0.38 to 0.62 and on higher end of therapeutic after decreasing drip rate to 1700 units/hr. CBC wnl. No overt bleeding or infusion issues per RN. Will decrease drip rate slightly to maintain lower end of therapeutic.   Goal of Therapy:  Heparin level 0.3-0.7 units/ml Monitor platelets by anticoagulation protocol: Yes   Plan:  Decrease heparin infusion to 1650 units/hr Monitor daily HL, CBC, s/sx bleeding  Richardine Service, PharmD PGY2 Cardiology Pharmacy Resident Phone: [phone] 03/04/2020  1:51 PM  Please check [email or web address] for unit-specific pharmacy phone numbers.

## 2020-03-04 NOTE — Evaluation (Signed)
Clinical/Bedside Swallow Evaluation Patient Details  Name: Richard Hatfield MRN: [id number] Date of Birth: 1956/04/03  Today's Date: 03/04/2020 Time: SLP Start Time (ACUTE ONLY): 5038 SLP Stop Time (ACUTE ONLY): 0919 SLP Time Calculation (min) (ACUTE ONLY): 45 min  Past Medical History:  Past Medical History:  Diagnosis Date  . Diabetes mellitus without complication ([place])   . Hypertension    Past Surgical History:  HPI:  64yo male admitted 02/28/20 with cardiac arrest. PMH: HTN, HLD, DM2. Pt intubated 7/13-15/21.  SLP attempted to see pt yesterday but he was apneic and lethargic therefore deferred.  Pt transferred to [place].   Assessment / Plan / Recommendation Clinical Impression  Today pt is more alert and respiratory rate WFL.  He tends to lean to the left and is severely dysarthric with imprecise articulation, rapid rate and weak phonation needing max cues to slow rate of speaking to improve intelligibility.  He is grossly weak/deconditioned overall. Pt was provided with po of applesauce, nectar thick juice, thin water and ice chips.  Clinical appearance of delayed swallow, anterior loss of liquid swallows via cup x3 noted due to weakness.  Although pt without s/s of aspiration with intake provided, his delay in swallow, weak cough and mentation raises his risk of aspirating.  Recommend full liquid diet (nectar thick) via tsp and allow pt to have tsps of thin water between meals.  Anticipate pt's swallow to continue to improve as AMS abates.  Using teach back, pt educated to 2 clinical reasonings for precautions.  He will need encouragement to focus on eating/drinking and not speak as he is verbose.  Recommend cognitive evaluation for this pt due to potential for deficits from his CPR, lack of oxygen.    SLP Visit Diagnosis: Dysphagia, oral phase (R13.11)    Aspiration Risk  Mild aspiration risk    Diet Recommendation Nectar-thick liquid;Ice chips PRN after oral care (full liquid, thin water  via tsp ok between meals)   Medication Administration: Crushed with puree Supervision: Staff to assist with self feeding Compensations: Slow rate;Small sips/bites;Other (Comment) Postural Changes: Seated upright at 90 degrees;Remain upright for at least 30 minutes after po intake    Other  Recommendations Oral Care Recommendations: Oral care QID Other Recommendations: Order thickener from pharmacy   Follow up Recommendations    TBD    Frequency and Duration min 2x/week  2 weeks       Prognosis Prognosis for Safe Diet Advancement: Fair      Swallow Study   General Date of Onset: 02/28/20 HPI: 64yo male admitted 02/28/20 with cardiac arrest. PMH: HTN, HLD, DM2. Pt intubated 7/13-15/21.  SLP attempted to see pt yesterday but he was apneic and lethargic therefore deferred.  Pt transferred to [place]. Type of Study: Bedside Swallow Evaluation Previous Swallow Assessment: none Diet Prior to this Study: NPO Respiratory Status: Room air History of Recent Intubation: Yes Length of Intubations (days): 2 days Date extubated: 03/01/20 Behavior/Cognition: Alert;Lethargic/Drowsy;Distractible;Requires cueing Oral Cavity Assessment: Dry Oral Care Completed by SLP: No Oral Cavity - Dentition: Adequate natural dentition Vision: Functional for self-feeding Self-Feeding Abilities: Total assist Patient Positioning: Upright in bed Baseline Vocal Quality: Low vocal intensity;Breathy Volitional Cough: Weak (productive at times) Volitional Swallow: Able to elicit (after oral cavity moistened)    Oral/Motor/Sensory Function Overall Oral Motor/Sensory Function: Generalized oral weakness   Ice Chips Ice chips: Within functional limits Presentation: Spoon   Thin Liquid Thin Liquid: Impaired Presentation: Spoon Oral Phase Functional Implications: Oral holding Other Comments: intermittent oral  holding    Nectar Thick Nectar Thick Liquid: Impaired Presentation: Cup;Spoon;Self Fed Oral Phase  Impairments: Reduced labial seal Oral phase functional implications: Left anterior spillage;Right anterior spillage Pharyngeal Phase Impairments: Suspected delayed Swallow   Honey Thick Honey Thick Liquid: Not tested   Puree Puree: Impaired Presentation: Spoon Oral Phase Functional Implications: Oral holding Other Comments: holding bolus in oral cavity while talking - needing max cues to cease talking   Solid     Solid: Not tested Other Comments: pt is severely dysarthric with imprecise articulation, rapid rate and weak phonation      Macario Golds 03/04/2020,9:39 AM  Kathleen Lime, MS [place] [facility] [phone]

## 2020-03-05 ENCOUNTER — Other Ambulatory Visit: Payer: Self-pay

## 2020-03-05 ENCOUNTER — Encounter (HOSPITAL_COMMUNITY): Payer: Self-pay | Admitting: Specialist

## 2020-03-05 DIAGNOSIS — I213 ST elevation (STEMI) myocardial infarction of unspecified site: Secondary | ICD-10-CM

## 2020-03-05 DIAGNOSIS — I25110 Atherosclerotic heart disease of native coronary artery with unstable angina pectoris: Secondary | ICD-10-CM

## 2020-03-05 DIAGNOSIS — E785 Hyperlipidemia, unspecified: Secondary | ICD-10-CM

## 2020-03-05 DIAGNOSIS — I10 Essential (primary) hypertension: Secondary | ICD-10-CM

## 2020-03-05 LAB — BASIC METABOLIC PANEL
Anion gap: 14 (ref 5–15)
BUN: 34 mg/dL — ABNORMAL HIGH (ref 8–23)
CO2: 23 mmol/L (ref 22–32)
Calcium: 10.1 mg/dL (ref 8.9–10.3)
Chloride: 111 mmol/L (ref 98–111)
Creatinine, Ser: 0.88 mg/dL (ref 0.61–1.24)
GFR calc Af Amer: >60 mL/min (ref >60)
GFR calc non Af Amer: >60 mL/min (ref >60)
Glucose, Bld: 153 mg/dL — ABNORMAL HIGH (ref 70–99)
Potassium: 4 mmol/L (ref 3.5–5.1)
Sodium: 148 mmol/L — ABNORMAL HIGH (ref 135–145)

## 2020-03-05 LAB — HEPARIN LEVEL (UNFRACTIONATED): Heparin Unfractionated: 0.55 IU/mL (ref 0.30–0.70)

## 2020-03-05 LAB — CBC
HCT: 47.7 % (ref 39.0–52.0)
Hemoglobin: 15.4 g/dL (ref 13.0–17.0)
MCH: 32.1 pg (ref 26.0–34.0)
MCHC: 32.3 g/dL (ref 30.0–36.0)
MCV: 99.4 fL (ref 80.0–100.0)
Platelets: 237 10*3/uL (ref 150–400)
RBC: 4.8 MIL/uL (ref 4.22–5.81)
RDW: 11.9 % (ref 11.5–15.5)
WBC: 8.8 10*3/uL (ref 4.0–10.5)
nRBC: 0 % (ref 0.0–0.2)

## 2020-03-05 LAB — GLUCOSE, CAPILLARY
Glucose-Capillary: 159 mg/dL — ABNORMAL HIGH (ref 70–99)
Glucose-Capillary: 168 mg/dL — ABNORMAL HIGH (ref 70–99)
Glucose-Capillary: 192 mg/dL — ABNORMAL HIGH (ref 70–99)
Glucose-Capillary: 240 mg/dL — ABNORMAL HIGH (ref 70–99)
Glucose-Capillary: 256 mg/dL — ABNORMAL HIGH (ref 70–99)
Glucose-Capillary: 280 mg/dL — ABNORMAL HIGH (ref 70–99)

## 2020-03-05 MED ORDER — GLUCERNA SHAKE PO LIQD
237.0000 mL | Freq: Two times a day (BID) | ORAL | Status: DC
  Administered 2020-03-07 – 2020-03-08 (×3): 237 mL via ORAL

## 2020-03-05 MED ORDER — HYDRALAZINE HCL 20 MG/ML IJ SOLN: 2.0000 mg | INTRAMUSCULAR | Status: DC | PRN

## 2020-03-05 MED ORDER — METOPROLOL TARTRATE 50 MG PO TABS
50.0000 mg | ORAL_TABLET | Freq: Two times a day (BID) | ORAL | Status: DC
  Administered 2020-03-05 – 2020-03-07 (×6): 50 mg via ORAL
  Filled 2020-03-05 (×7): qty 1

## 2020-03-05 MED ORDER — INSULIN ASPART 100 UNIT/ML ~~LOC~~ SOLN
0.0000 [IU] | Freq: Three times a day (TID) | SUBCUTANEOUS | Status: DC
  Administered 2020-03-05: 8 [IU] via SUBCUTANEOUS
  Administered 2020-03-06: 5 [IU] via SUBCUTANEOUS
  Administered 2020-03-06 – 2020-03-07 (×3): 3 [IU] via SUBCUTANEOUS
  Administered 2020-03-07: 8 [IU] via SUBCUTANEOUS
  Administered 2020-03-07 – 2020-03-08 (×2): 5 [IU] via SUBCUTANEOUS
  Administered 2020-03-08: 3 [IU] via SUBCUTANEOUS

## 2020-03-05 MED ORDER — CLOPIDOGREL BISULFATE 300 MG PO TABS
300.0000 mg | ORAL_TABLET | Freq: Once | ORAL | Status: AC
  Administered 2020-03-05: 300 mg via ORAL
  Filled 2020-03-05: qty 1

## 2020-03-05 MED ORDER — METOPROLOL TARTRATE 5 MG/5ML IV SOLN: 2.5000 mg | INTRAVENOUS | Status: DC | PRN

## 2020-03-05 MED ORDER — INSULIN ASPART 100 UNIT/ML ~~LOC~~ SOLN: 0.0000 [IU] | Freq: Every day | SUBCUTANEOUS | Status: DC

## 2020-03-05 MED ORDER — METOPROLOL TARTRATE 25 MG/10 ML ORAL SUSPENSION: 50.0000 mg | Freq: Two times a day (BID) | ORAL | Status: DC

## 2020-03-05 MED ORDER — ROSUVASTATIN CALCIUM 20 MG PO TABS
40.0000 mg | ORAL_TABLET | Freq: Every day | ORAL | Status: DC
  Administered 2020-03-05 – 2020-03-08 (×4): 40 mg via ORAL
  Filled 2020-03-05 (×4): qty 2

## 2020-03-05 MED ORDER — AMLODIPINE BESYLATE 5 MG PO TABS
5.0000 mg | ORAL_TABLET | Freq: Every day | ORAL | Status: DC
  Administered 2020-03-05 – 2020-03-08 (×4): 5 mg via ORAL
  Filled 2020-03-05 (×4): qty 1

## 2020-03-05 MED ORDER — CLOPIDOGREL BISULFATE 75 MG PO TABS
75.0000 mg | ORAL_TABLET | Freq: Every day | ORAL | Status: DC
  Administered 2020-03-06 – 2020-03-08 (×3): 75 mg via ORAL
  Filled 2020-03-05 (×3): qty 1

## 2020-03-05 MED ORDER — ENSURE ENLIVE PO LIQD
237.0000 mL | Freq: Two times a day (BID) | ORAL | Status: DC
  Administered 2020-03-05: 237 mL via ORAL

## 2020-03-05 MED ORDER — METOPROLOL TARTRATE 5 MG/5ML IV SOLN: 5.0000 mg | INTRAVENOUS | Status: DC

## 2020-03-05 NOTE — Progress Notes (Signed)
Inpatient Rehabilitation Admissions Coordinator  I will follow pt's progress with tolerance for more agressive therapies to assist with planning possible dispo options.  Danne Baxter, RN, MSN Rehab Admissions Coordinator [phone] 03/05/2020 4:21 PM

## 2020-03-05 NOTE — Progress Notes (Signed)
Patient transferred from ICU at 1740hrs.  Oriented to unit and plan of care for shift. Noted to be oriented to self only at this time. Bed alarm on.

## 2020-03-05 NOTE — Progress Notes (Signed)
NAME:  Richard Hatfield, MRN:  [id number], DOB:  09-Jun-1956, LOS: 6 ADMISSION DATE:  02/28/2020, CONSULTATION DATE: 02/28/2020 referring MD: Cardiology, CHIEF COMPLAINT: Cardiac arrest  Brief History   Patient is a 64 year old status post cardiac arrest, ROSC after 10 minutes.  VDRF, extubated on 7/15.  Left heart cath with diffuse CAD and evidence for vasospasm.  Transferred to floor 7/16 on heparin infusion, empiric antibiotics for possible aspiration pneumonia.  Subsequent course complicated by agitated delirium.  Acute encephalopathy 7/17 a.m. after Ativan.  Back to ICU 7/17, code stroke called.   Past Medical History  No known prior medical history.  Is a non-smoker and drinks occasionally  Significant Hospital Events   Cardiac catheterization 02/28/2020  7/18 - Agitated delirium last 24 hours, concern for possible alcohol withdrawal.  Received Ativan, last dose 0300 7/17.  Became obtunded, increase work of breathing.  Code stroke activated, head CT as above  Consults:  Cardiology PCCM  Procedures:  ET tube 7/13 >> 7/15  Significant Diagnostic Tests:  7/13 echocardiogram>>LVEF 50-55%--no regional wall abn. Normal RV size and function. Mildly elevated PASP. Mild LA dilation. No valvular abnormalities.  7/14 CXR>>increase b/l infiltrates/atelectasis  CT head 7/17 >> no acute bleed, no hydrocephalus, preliminary vascular eval reassuring.  Full perfusion report pending  Micro Data:  COVID-19 7/13 >> negative MRSA screen 7/13 >> negative  Antimicrobials:  Ceftriaxone 7/14 >> 7/19  Interim history/subjective:   7/19- MRI showed T2/Flair signal of  may represent anoxic brain injury given history.  7/18 - seeb bt SLP - > deemed mild aspiration risk. Per RN - confusion slowly improving. MRI pending today. Still restless and tries to come out of bed. RN does not think he is appropriate for transffer out. Sitter at bedside  Objective   Blood pressure (!) 156/89, pulse 73, temperature  98.3 F (36.8 C), temperature source Axillary, resp. rate 19, height 5\' 8"  (1.727 m), weight 84 kg, SpO2 99 %.        Intake/Output Summary (Last 24 hours) at 03/05/2020 0727 Last data filed at 03/05/2020 0600 Gross per 24 hour  Intake 988.52 ml  Output 3050 ml  Net -2061.48 ml   Filed Weights   03/02/20 0500 03/04/20 0530 03/05/20 0600  Weight: 91.7 kg 86 kg 84 kg    Examination:  General: NAD, nl appearance HE: Normocephalic, atraumatic , pupil on left slightly larger than right, both reactive. Conjunctivae normal ENT: No congestion, no rhinorrhea, no exudate or erythema  Cardiovascular: Normal rate, regular rhythm.  No murmurs, rubs, or gallops Pulmonary : Effort normal, breath sounds normal. No wheezes, rales, or rhonchi Abdominal: soft, nontender,  bowel sounds present Musculoskeletal: no swelling , deformity, injury ,or tenderness in extremities, Skin: Warm, dry  Psychiatric/Behavioral:  normal mood, normal behavior  Neuro: slow to respond, following commands and having difficulty with attention task. Dysarthric speech. No cranial nerves , focal motor , or sensory deficits.   WBC 8.8, Hgb 15, K 4  Resolved Hospital Problem list   AKI Acute hypoxic respiratory requiring mechanical ventilation. Aspiration Pneumoninia - S/p 5 days of ceftriaxone   Assessment & Plan:   Acute ischemic encephalopathy verse toxic metabolic encephelopathy Patient given Ativan on morning of 7/16 and late in the morning reported to have drowsiness , inability to communicate, concern was for acute stroke. CT head negative 7/17. EEG 7/17 with generalized slowing. MRI  Show T2/flair signal abnormality involving thalami, this could reflect anoxic brain injury.  Plan: - Greatly appreciate neurolog consult   -  avoid benzo - PT/OT/SLP  OOH Vfib cardiac arrest s/p normothermic TTM CAD, diffuse.  Coronary vasospasm - 7/13 left heart cath showed diffuse stenosis, and cardiology noted coronary  vasospasm of LAD with stenosis likely sas reason for VF cardiac arrest.  Plan: -ASA, restart statin,  - PO metoprolol 50 BID -Appreciate cardiology assistance, he will be evaluated for suitability for possible CABG depending on progress, mental status, etc.  HTN Plan: amlodipine 5 mg  On metoprolol as above - PRN hydralazine SBP > 160     Aspiration pneumonia 7/18  - afebrile, tmax 99.2, WBC nl  Plan : - s/p 5 days total ceftriaxone -pulm hygiene    Tamsen Snider, MD PGY2      LABS    PULMONARY Recent Labs  Lab 02/28/20 0223 02/28/20 0506 03/03/20 1025  PHART  --  7.296* 7.492*  PCO2ART  --  44.3 38.9  PO2ART  --  216* 60.1*  HCO3  --  21.6 29.4*  TCO2 23 23  --   O2SAT  --  100.0 89.6    CBC Recent Labs  Lab 03/03/20 0930 03/04/20 0243 03/05/20 0215  HGB 14.3 14.9 15.4  HCT 42.8 43.7 47.7  WBC 9.2 8.8 8.8  PLT 225 169 237    COAGULATION Recent Labs  Lab 02/28/20 0126 02/28/20 0543 02/28/20 2006  INR 1.1 1.2 1.1    CARDIAC  No results for input(s): TROPONINI in the last 168 hours. No results for input(s): PROBNP in the last 168 hours.   CHEMISTRY Recent Labs  Lab 02/29/20 0056 02/29/20 0056 03/01/20 0558 03/01/20 0558 03/02/20 0445 03/02/20 0445 03/03/20 0930 03/03/20 0930 03/04/20 0243 03/05/20 0215  NA 143   < > 145  --  147*  --  143  --  147* 148*  K 5.3*   < > 5.0   < > 3.5   < > 3.5   < > 4.7 4.0  CL 109   < > 108  --  103  --  103  --  109 111  CO2 24   < > 26  --  29  --  23  --  23 23  GLUCOSE 184*   < > 139*  --  109*  --  113*  --  134* 153*  BUN 21   < > 24*  --  25*  --  28*  --  35* 34*  CREATININE 1.08   < > 1.01  --  1.01  --  1.02  --  0.99 0.88  CALCIUM 8.1*   < > 8.8*  --  9.7  --  9.9  --  9.9 10.1  MG 2.3  --  2.8*  --  2.3  --  2.2  --  2.6*  --   PHOS 5.3*  --  3.7  --  2.4*  --  3.4  --   --   --    < > = values in this interval not displayed.   Estimated Creatinine Clearance: 93 mL/min (by C-G formula  based on SCr of 0.88 mg/dL).   LIVER Recent Labs  Lab 02/28/20 0126 02/28/20 0543 02/28/20 2006 02/29/20 0056  AST 135*  --   --  80*  ALT 100*  --   --  86*  ALKPHOS 41  --   --  27*  BILITOT 0.4  --   --  0.5  PROT 6.6  --   --  6.6  ALBUMIN 3.9  --   --  3.7  INR 1.1 1.2 1.1  --      INFECTIOUS No results for input(s): LATICACIDVEN, PROCALCITON in the last 168 hours.   ENDOCRINE CBG (last 3)  Recent Labs    03/04/20 1937 03/05/20 0003 03/05/20 0500  GLUCAP 251* 168* 159*         IMAGING x48h  - image(s) personally visualized  -   highlighted in bold CT Code Stroke CTA Head W/WO contrast  Result Date: 03/03/2020 CLINICAL DATA:  Aphasia.  Rule out stroke. EXAM: CT ANGIOGRAPHY HEAD AND NECK CT PERFUSION BRAIN TECHNIQUE: Multidetector CT imaging of the head and neck was performed using the standard protocol during bolus administration of intravenous contrast. Multiplanar CT image reconstructions and MIPs were obtained to evaluate the vascular anatomy. Carotid stenosis measurements (when applicable) are obtained utilizing NASCET criteria, using the distal internal carotid diameter as the denominator. Multiphase CT imaging of the brain was performed following IV bolus contrast injection. Subsequent parametric perfusion maps were calculated using RAPID software. CONTRAST:  157mL OMNIPAQUE IOHEXOL 350 MG/ML SOLN COMPARISON:  CT head 03/03/2020 FINDINGS: FINDINGS CTA NECK FINDINGS Aortic arch: Standard branching. Imaged portion shows no evidence of aneurysm or dissection. No significant stenosis of the major arch vessel origins. Right carotid system: The patient moved during scanning through the neck and head. There is significant artifact of the right carotid bifurcation which is not well evaluated. Right internal carotid artery is patent above the bifurcation. No definite calcification. Left carotid system: Left carotid bifurcation is patent. There is extensive artifact due to  motion in the cervical internal carotid artery which is not well evaluated. Distal left cervical internal carotid artery appears normal. Vertebral arteries: Patient moved causing significant artifact. Allowing for this, no vertebral artery abnormality identified. Skeleton: Limited evaluation due to motion. No acute skeletal lesion identified. Other neck: Negative Upper chest: Small left effusion. Mild atelectasis or infiltrate in the right upper lobe posteriorly. Review of the MIP images confirms the above findings CTA HEAD FINDINGS Anterior circulation: Atherosclerotic calcification in the cavernous carotid bilaterally causing mild stenosis. Middle cerebral arteries patent bilaterally without stenosis identified. Anterior cerebral arteries are obscured by motion. Posterior circulation: Both vertebral arteries patent to the basilar. Basilar patent. Posterior cerebral arteries patent bilaterally. Venous sinuses: Normal enhancement Anatomic variants: None Review of the MIP images confirms the above findings CT Brain Perfusion Findings: ASPECTS: 10 CBF (<30%) Volume: 26mL Perfusion (Tmax>6.0s) volume: 5mL Mismatch Volume: 25mL Infarction Location:None IMPRESSION: 1. Images degraded by extensive motion through the carotid bifurcation and through the head. 2. No significant intracranial or extracranial stenosis. Repeat study recommended if symptoms warrant. 3. CT perfusion negative for acute infarct or ischemia. Electronically Signed   By: Franchot Gallo M.D.   On: 03/03/2020 11:46   CT HEAD WO CONTRAST  Result Date: 03/03/2020 CLINICAL DATA:  Encephalopathy. EXAM: CT HEAD WITHOUT CONTRAST TECHNIQUE: Contiguous axial images were obtained from the base of the skull through the vertex without intravenous contrast. COMPARISON:  CT head 02/28/2020 FINDINGS: Brain: No evidence of acute infarction, hemorrhage, hydrocephalus, extra-axial collection or mass lesion/mass effect. Vascular: Negative for hyperdense vessel Skull:  Negative Sinuses/Orbits: Mild mucosal edema paranasal sinuses. Negative orbit Other: None IMPRESSION: Negative CT head Preliminary report texted to Dr. Rory Percy Electronically Signed   By: Franchot Gallo M.D.   On: 03/03/2020 11:02   CT Code Stroke CTA Neck W/WO contrast  Result Date: 03/03/2020 CLINICAL DATA:  Aphasia.  Rule out stroke. EXAM: CT ANGIOGRAPHY HEAD AND NECK CT PERFUSION  BRAIN TECHNIQUE: Multidetector CT imaging of the head and neck was performed using the standard protocol during bolus administration of intravenous contrast. Multiplanar CT image reconstructions and MIPs were obtained to evaluate the vascular anatomy. Carotid stenosis measurements (when applicable) are obtained utilizing NASCET criteria, using the distal internal carotid diameter as the denominator. Multiphase CT imaging of the brain was performed following IV bolus contrast injection. Subsequent parametric perfusion maps were calculated using RAPID software. CONTRAST:  176mL OMNIPAQUE IOHEXOL 350 MG/ML SOLN COMPARISON:  CT head 03/03/2020 FINDINGS: FINDINGS CTA NECK FINDINGS Aortic arch: Standard branching. Imaged portion shows no evidence of aneurysm or dissection. No significant stenosis of the major arch vessel origins. Right carotid system: The patient moved during scanning through the neck and head. There is significant artifact of the right carotid bifurcation which is not well evaluated. Right internal carotid artery is patent above the bifurcation. No definite calcification. Left carotid system: Left carotid bifurcation is patent. There is extensive artifact due to motion in the cervical internal carotid artery which is not well evaluated. Distal left cervical internal carotid artery appears normal. Vertebral arteries: Patient moved causing significant artifact. Allowing for this, no vertebral artery abnormality identified. Skeleton: Limited evaluation due to motion. No acute skeletal lesion identified. Other neck: Negative  Upper chest: Small left effusion. Mild atelectasis or infiltrate in the right upper lobe posteriorly. Review of the MIP images confirms the above findings CTA HEAD FINDINGS Anterior circulation: Atherosclerotic calcification in the cavernous carotid bilaterally causing mild stenosis. Middle cerebral arteries patent bilaterally without stenosis identified. Anterior cerebral arteries are obscured by motion. Posterior circulation: Both vertebral arteries patent to the basilar. Basilar patent. Posterior cerebral arteries patent bilaterally. Venous sinuses: Normal enhancement Anatomic variants: None Review of the MIP images confirms the above findings CT Brain Perfusion Findings: ASPECTS: 10 CBF (<30%) Volume: 21mL Perfusion (Tmax>6.0s) volume: 3mL Mismatch Volume: 67mL Infarction Location:None IMPRESSION: 1. Images degraded by extensive motion through the carotid bifurcation and through the head. 2. No significant intracranial or extracranial stenosis. Repeat study recommended if symptoms warrant. 3. CT perfusion negative for acute infarct or ischemia. Electronically Signed   By: Franchot Gallo M.D.   On: 03/03/2020 11:46   MR BRAIN WO CONTRAST  Result Date: 03/05/2020 CLINICAL DATA:  Initial evaluation for acute encephalopathy. Status post cardiac arrest. EXAM: MRI HEAD WITHOUT CONTRAST TECHNIQUE: Multiplanar, multiecho pulse sequences of the brain and surrounding structures were obtained without intravenous contrast. COMPARISON:  Prior CTs from 03/03/2020. FINDINGS: Brain: Examination degraded by motion artifact. Generalized age-related cerebral atrophy. There is subtly increased symmetric T2/FLAIR signal abnormality seen involving the dorsal medial aspects of the thalami (series 11, image 12), nonspecific, but suspected to be related to a degree of anoxic injury given provided history. No associated mass effect or hemorrhage. No other focal parenchymal signal abnormality or evidence for significant cerebral anoxia  elsewhere within the brain. No evidence for acute or subacute infarct. Gray-white matter differentiation maintained. No areas of remote cortical infarction. No evidence for acute or chronic intracranial hemorrhage. No mass lesion, midline shift or mass effect. No hydrocephalus or extra-axial fluid collection. Pituitary gland suprasellar region within normal limits. Midline structures intact. Vascular: Major intracranial vascular flow voids are maintained. Skull and upper cervical spine: Craniocervical junction normal. Bone marrow signal intensity within normal limits. No scalp soft tissue abnormality. Sinuses/Orbits: Globes and orbital soft tissues within normal limits. Mild scattered mucosal thickening noted within the paranasal sinuses. No mastoid effusion. Inner ear structures grossly normal. Other: None. IMPRESSION:  1. 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 2. Otherwise normal brain MRI for age. No other acute intracranial abnormality. Electronically Signed   By: Jeannine Boga M.D.   On: 03/05/2020 00:14   CT Code Stroke Cerebral Perfusion with contrast  Result Date: 03/03/2020 CLINICAL DATA:  Aphasia.  Rule out stroke. EXAM: CT ANGIOGRAPHY HEAD AND NECK CT PERFUSION BRAIN TECHNIQUE: Multidetector CT imaging of the head and neck was performed using the standard protocol during bolus administration of intravenous contrast. Multiplanar CT image reconstructions and MIPs were obtained to evaluate the vascular anatomy. Carotid stenosis measurements (when applicable) are obtained utilizing NASCET criteria, using the distal internal carotid diameter as the denominator. Multiphase CT imaging of the brain was performed following IV bolus contrast injection. Subsequent parametric perfusion maps were calculated using RAPID software. CONTRAST:  126mL OMNIPAQUE IOHEXOL 350 MG/ML SOLN COMPARISON:   CT head 03/03/2020 FINDINGS: FINDINGS CTA NECK FINDINGS Aortic arch: Standard branching. Imaged portion shows no evidence of aneurysm or dissection. No significant stenosis of the major arch vessel origins. Right carotid system: The patient moved during scanning through the neck and head. There is significant artifact of the right carotid bifurcation which is not well evaluated. Right internal carotid artery is patent above the bifurcation. No definite calcification. Left carotid system: Left carotid bifurcation is patent. There is extensive artifact due to motion in the cervical internal carotid artery which is not well evaluated. Distal left cervical internal carotid artery appears normal. Vertebral arteries: Patient moved causing significant artifact. Allowing for this, no vertebral artery abnormality identified. Skeleton: Limited evaluation due to motion. No acute skeletal lesion identified. Other neck: Negative Upper chest: Small left effusion. Mild atelectasis or infiltrate in the right upper lobe posteriorly. Review of the MIP images confirms the above findings CTA HEAD FINDINGS Anterior circulation: Atherosclerotic calcification in the cavernous carotid bilaterally causing mild stenosis. Middle cerebral arteries patent bilaterally without stenosis identified. Anterior cerebral arteries are obscured by motion. Posterior circulation: Both vertebral arteries patent to the basilar. Basilar patent. Posterior cerebral arteries patent bilaterally. Venous sinuses: Normal enhancement Anatomic variants: None Review of the MIP images confirms the above findings CT Brain Perfusion Findings: ASPECTS: 10 CBF (<30%) Volume: 42mL Perfusion (Tmax>6.0s) volume: 32mL Mismatch Volume: 76mL Infarction Location:None IMPRESSION: 1. Images degraded by extensive motion through the carotid bifurcation and through the head. 2. No significant intracranial or extracranial stenosis. Repeat study recommended if symptoms warrant. 3. CT  perfusion negative for acute infarct or ischemia. Electronically Signed   By: Franchot Gallo M.D.   On: 03/03/2020 11:46   DG Chest Port 1 View  Result Date: 03/04/2020 CLINICAL DATA:  Acute respiratory failure with hypoxia EXAM: PORTABLE CHEST 1 VIEW COMPARISON:  Chest radiograph 03/03/2020 FINDINGS: With stable cardiomediastinal contours with enlarged heart size. There are persistent very mild bilateral interstitial opacities, possibly chronic lung disease. No focal consolidation. No pneumothorax or significant pleural effusion. IMPRESSION: Stable chest with persistent mild bilateral interstitial opacities. Electronically Signed   By: Audie Pinto M.D.   On: 03/04/2020 15:26   DG Chest Port 1 View  Result Date: 03/03/2020 CLINICAL DATA:  Acute respiratory distress. EXAM: PORTABLE CHEST 1 VIEW COMPARISON:  03/01/2020 FINDINGS: Borderline enlarged cardiac silhouette with an interval decrease in size. The endotracheal tube and nasogastric tube have been removed. Mildly decreased prominence of the interstitial markings with resolved left lung airspace opacities and right lung linear densities. Unremarkable  bones. IMPRESSION: 1. Resolved left lung alveolar edema and right lung atelectasis. 2. Improved probable combination of interstitial pulmonary edema and chronic interstitial lung disease. Electronically Signed   By: Claudie Revering M.D.   On: 03/03/2020 10:57   EEG adult  Result Date: 03/03/2020 Lora Havens, MD     03/03/2020  1:50 PM Patient Name: Richard Hatfield MRN: [id number] Epilepsy Attending: Lora Havens Referring Physician/Provider: Dr Amie [place] Date: 7/08/25/2019 Duration: 23.08 mins Patient history: 64 year old past history of diabetes, hypertension, with no known coronary disease brought in after cardiac arrest requiring 10 minutes of CPR. Had worsening mental status today. EEG to evaluate for seizure. Level of alertness: comatose AEDs during EEG study: None Technical aspects: This  EEG study was done with scalp electrodes positioned according to the 10-20 International system of electrode placement. Electrical activity was acquired at a sampling rate of 500Hz  and reviewed with a high frequency filter of 70Hz  and a low frequency filter of 1Hz . EEG data were recorded continuously and digitally stored. Description: EEG showed continuous generalized 5-8 Hz theta slowing as well as 2-3Hz  delta slowing. Hyperventilation and photic stimulation were not performed.   ABNORMALITY -Continuous slow, generalized IMPRESSION: This study is suggestive of severe diffuse encephalopathy, nonspecific etiology. No seizures or epileptiform discharges were seen throughout the recording. Richard Hatfield

## 2020-03-05 NOTE — Consult Note (Signed)
Physical Medicine and Rehabilitation Consult   Reason for Consult: Functional deficits due to ABI .  Referring Physician: Dr. Chase Caller   HPI: Richard Hatfield is a 64 y.o. male with history of HTN, T2DM, ETOH abuse; who was admitted 07/13/21after cardiac arrest. Per reports, Wife woke up husband having jerking movements and difficulty breathing,activated EMS, performed CPR X 8 minutes followed by 12 minutes of CPR/ACLS for VT by EMS with ROSC. Patient mildly responsive to sternal rubs at admission and intubated in ED. UDS negative. EKG with mild ST changes and he was taken to cath lab and was found to have diffuse CAD with EF 45-50% and no acute thrombus.  VF arrest felt to be due to transient LAD coronary spasm superimposed on significant multivessel CAD and CVTS consult recommended for input. He continued to have decrease in LOC with sluggish movements and EEG showed severe diffuse encephalopathy felt to be due to sedation or anoxic BI and was negative for seizures.   He was extubated without difficulty on 07/15 and was noted to have confusion with bouts of agitation question ETOH withdrawal v/s delirium.  He continues on IV heparin as well as IV lopressor due to NSVT. He received IV ativan on am of 07/17 with obtundation, hypoxia with bouts of apnea and sluggish pupils therefore code stroke activated and he was transferred to ICU.  Neurology consulted for input and questioned stroke as cause of MS changes. CT  Head was negative for bleed or acute changes. CTA head/neck and cerebral perfusion study done and was negative for infarct or ischemia. MRI done revealing T2 flare felt to be due to anoxic injury. Bedside swallow showed signs of dysphagia and full liquid diet recommended. He was also noted to be severely dysarthric with rapid rate of speech, verbose, cognitive deficits and weakness. CIR recommended due to functional decline.    Review of Systems  Reason unable to perform ROS: Confusion.       Past Medical History:  Diagnosis Date  . Diabetes mellitus without complication ([place])   . Hypertension     Past Surgical History:  Procedure Laterality Date  . LEFT HEART CATH AND CORONARY ANGIOGRAPHY N/A 02/28/2020   Procedure: LEFT HEART CATH AND CORONARY ANGIOGRAPHY;  Surgeon: Troy Sine, MD;  Location: [facility];  Service: Cardiovascular;  Laterality: N/A;     Family History: Unable to elicit due to mentation.      Social History:  Married. 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 He  reports that he has never smoked. He has never used smokeless tobacco. He reports previous alcohol use--quit in Jan?. He reports that he does not use drugs.    Allergies: No Known Allergies    Medications Prior to Admission  Medication Sig Dispense Refill  . cetirizine (ZYRTEC) 10 MG chewable tablet Chew 10 mg by mouth daily.    . colestipol (COLESTID) 1 g tablet Take 1 g by mouth 2 (two) times daily.    Marland Kitchen diltiazem (DILACOR XR) 180 MG 24 hr capsule Take 180 mg by mouth daily.    . empagliflozin (JARDIANCE) 25 MG TABS tablet Take 25 mg by mouth daily.    . fenofibrate (TRICOR) 145 MG tablet Take 145 mg by mouth daily.    . insulin aspart (NOVOLOG) 100 UNIT/ML injection Inject 8 Units into the skin 3 (three) times daily before meals.    . Insulin Degludec (TRESIBA) 100 UNIT/ML SOLN Inject 90 Units into  the skin daily.    . metFORMIN (GLUCOPHAGE) 500 MG tablet Take 500-1,000 mg by mouth See admin instructions. Take 2 tablets by mouth every morning and 1 tablet every evening    . pravastatin (PRAVACHOL) 40 MG tablet Take 40 mg by mouth daily.    . valsartan (DIOVAN) 320 MG tablet Take 320 mg by mouth daily.      Home: Home Living Family/patient expects to be discharged to:: Private residence Living Arrangements: Spouse/significant other Available Help at Discharge: Family Type of Home: House Home Access: Stairs to enter Technical brewer of Steps:  1 Home Layout: Two level, Able to live on main level with bedroom/bathroom Home Equipment: None  Functional History: Prior Function Level of Independence: Independent Comments: works as Primary school teacher Status:  Mobility: [place] bed mobility: Needs Assistance Bed Mobility: Supine to Sit, Sit to Supine Supine to sit: +2 for physical assistance, Total assist Sit to supine: +2 for physical assistance, Total assist General bed mobility comments: Assist to bring legs off of bed, elevate trunk into sitting and bring hips to EOB. Assist to lower trunk and bring legs back up into bed.        ADL:    Cognition: Cognition Overall Cognitive Status: Impaired/Different from baseline Orientation Level: Oriented to person, Oriented to place Cognition Arousal/Alertness: Lethargic Behavior During Therapy: Flat affect Overall Cognitive Status: Impaired/Different from baseline Area of Impairment: Orientation, Attention, Memory, Following commands, Safety/judgement, Problem solving, Awareness Orientation Level: Disoriented to, Place, Time, Situation Current Attention Level: Focused Memory: Decreased short-term memory Following Commands: Follows one step commands inconsistently, Follows one step commands with increased time Safety/Judgement: Decreased awareness of safety, Decreased awareness of deficits Awareness: Intellectual Problem Solving: Slow processing, Decreased initiation, Difficulty sequencing, Requires verbal cues, Requires tactile cues General Comments: Would arouse with verbal and tactile stimuli. More awake sitting EOB.  Blood pressure (!) 156/89, pulse 73, temperature 98 F (36.7 C), temperature source Oral, resp. rate 19, height 5\' 8"  (1.727 m), weight 84 kg, SpO2 99 %. Physical Exam Vitals and nursing note reviewed.  Constitutional:      Appearance: Normal appearance.     Comments: Sitter in room on the right and mitten on left  hand.   HENT:     Mouth/Throat:     Mouth: Mucous membranes are dry.  Eyes:     Extraocular Movements: Extraocular movements intact.     Conjunctiva/sclera: Conjunctivae normal.     Pupils: Pupils are equal, round, and reactive to light.  Cardiovascular:     Rate and Rhythm: Normal rate and regular rhythm.  Pulmonary:     Effort: Pulmonary effort is normal. No respiratory distress.     Breath sounds: Normal breath sounds. No stridor.  Abdominal:     General: Abdomen is flat. Bowel sounds are normal. There is no distension.     Palpations: Abdomen is soft.  Musculoskeletal:     Cervical back: Normal range of motion. No rigidity or tenderness.  Skin:    General: Skin is warm and dry.  Neurological:     Mental Status: He is alert and oriented to person, place, and time.     Comments: Slow and severely dysarthric speech. He was oriented to self and place as hospital but thought he was in [place] etc--unable to correct. Able to state DOB--age 24. Month "November". Slow movements but was able to follow simple motor commands.   Motor strength is 4/5 bilateral deltoid bicep tricep grip 3 -  at the hip flexors knee extensors for the ankle dorsiflexors plantar flexor Patient reports sensation is equal to light touch bilateral upper and lower extremities he is able to identify which digit is touched bilaterally.  Psychiatric:        Mood and Affect: Mood normal.        Behavior: Behavior normal.    Results for orders placed or performed during the hospital encounter of 02/28/20 (from the past 24 hour(s))  Glucose, capillary     Status: Abnormal   Collection Time: 03/04/20 11:11 AM  Result Value Ref Range   Glucose-Capillary 182 (H) 70 - 99 mg/dL  Glucose, capillary     Status: Abnormal   Collection Time: 03/04/20  5:05 PM  Result Value Ref Range   Glucose-Capillary 146 (H) 70 - 99 mg/dL  Glucose, capillary     Status: Abnormal   Collection Time: 03/04/20  7:37 PM  Result Value Ref  Range   Glucose-Capillary 251 (H) 70 - 99 mg/dL  Glucose, capillary     Status: Abnormal   Collection Time: 03/05/20 12:03 AM  Result Value Ref Range   Glucose-Capillary 168 (H) 70 - 99 mg/dL  Heparin level (unfractionated)     Status: None   Collection Time: 03/05/20  2:15 AM  Result Value Ref Range   Heparin Unfractionated 0.55 0.30 - 0.70 IU/mL  CBC     Status: None   Collection Time: 03/05/20  2:15 AM  Result Value Ref Range   WBC 8.8 4.0 - 10.5 K/uL   RBC 4.80 4.22 - 5.81 MIL/uL   Hemoglobin 15.4 13.0 - 17.0 g/dL   HCT 47.7 39 - 52 %   MCV 99.4 80.0 - 100.0 fL   MCH 32.1 26.0 - 34.0 pg   MCHC 32.3 30.0 - 36.0 g/dL   RDW 11.9 11.5 - 15.5 %   Platelets 237 150 - 400 K/uL   nRBC 0.0 0.0 - 0.2 %  Basic metabolic panel     Status: Abnormal   Collection Time: 03/05/20  2:15 AM  Result Value Ref Range   Sodium 148 (H) 135 - 145 mmol/L   Potassium 4.0 3.5 - 5.1 mmol/L   Chloride 111 98 - 111 mmol/L   CO2 23 22 - 32 mmol/L   Glucose, Bld 153 (H) 70 - 99 mg/dL   BUN 34 (H) 8 - 23 mg/dL   Creatinine, Ser 0.88 0.61 - 1.24 mg/dL   Calcium 10.1 8.9 - 10.3 mg/dL   GFR calc non Af Amer >60 >60 mL/min   GFR calc Af Amer >60 >60 mL/min   Anion gap 14 5 - 15  Glucose, capillary     Status: Abnormal   Collection Time: 03/05/20  5:00 AM  Result Value Ref Range   Glucose-Capillary 159 (H) 70 - 99 mg/dL  Glucose, capillary     Status: Abnormal   Collection Time: 03/05/20  8:33 AM  Result Value Ref Range   Glucose-Capillary 280 (H) 70 - 99 mg/dL   CT Code Stroke CTA Head W/WO contrast  Result Date: 03/03/2020 CLINICAL DATA:  Aphasia.  Rule out stroke. EXAM: CT ANGIOGRAPHY HEAD AND NECK CT PERFUSION BRAIN TECHNIQUE: Multidetector CT imaging of the head and neck was performed using the standard protocol during bolus administration of intravenous contrast. Multiplanar CT image reconstructions and MIPs were obtained to evaluate the vascular anatomy. Carotid stenosis measurements (when  applicable) are obtained utilizing NASCET criteria, using the distal internal carotid diameter as the denominator.  Multiphase CT imaging of the brain was performed following IV bolus contrast injection. Subsequent parametric perfusion maps were calculated using RAPID software. CONTRAST:  169mL OMNIPAQUE IOHEXOL 350 MG/ML SOLN COMPARISON:  CT head 03/03/2020 FINDINGS: FINDINGS CTA NECK FINDINGS Aortic arch: Standard branching. Imaged portion shows no evidence of aneurysm or dissection. No significant stenosis of the major arch vessel origins. Right carotid system: The patient moved during scanning through the neck and head. There is significant artifact of the right carotid bifurcation which is not well evaluated. Right internal carotid artery is patent above the bifurcation. No definite calcification. Left carotid system: Left carotid bifurcation is patent. There is extensive artifact due to motion in the cervical internal carotid artery which is not well evaluated. Distal left cervical internal carotid artery appears normal. Vertebral arteries: Patient moved causing significant artifact. Allowing for this, no vertebral artery abnormality identified. Skeleton: Limited evaluation due to motion. No acute skeletal lesion identified. Other neck: Negative Upper chest: Small left effusion. Mild atelectasis or infiltrate in the right upper lobe posteriorly. Review of the MIP images confirms the above findings CTA HEAD FINDINGS Anterior circulation: Atherosclerotic calcification in the cavernous carotid bilaterally causing mild stenosis. Middle cerebral arteries patent bilaterally without stenosis identified. Anterior cerebral arteries are obscured by motion. Posterior circulation: Both vertebral arteries patent to the basilar. Basilar patent. Posterior cerebral arteries patent bilaterally. Venous sinuses: Normal enhancement Anatomic variants: None Review of the MIP images confirms the above findings CT Brain Perfusion  Findings: ASPECTS: 10 CBF (<30%) Volume: 70mL Perfusion (Tmax>6.0s) volume: 68mL Mismatch Volume: 47mL Infarction Location:None IMPRESSION: 1. Images degraded by extensive motion through the carotid bifurcation and through the head. 2. No significant intracranial or extracranial stenosis. Repeat study recommended if symptoms warrant. 3. CT perfusion negative for acute infarct or ischemia. Electronically Signed   By: Franchot Gallo M.D.   On: 03/03/2020 11:46   CT HEAD WO CONTRAST  Result Date: 03/03/2020 CLINICAL DATA:  Encephalopathy. EXAM: CT HEAD WITHOUT CONTRAST TECHNIQUE: Contiguous axial images were obtained from the base of the skull through the vertex without intravenous contrast. COMPARISON:  CT head 02/28/2020 FINDINGS: Brain: No evidence of acute infarction, hemorrhage, hydrocephalus, extra-axial collection or mass lesion/mass effect. Vascular: Negative for hyperdense vessel Skull: Negative Sinuses/Orbits: Mild mucosal edema paranasal sinuses. Negative orbit Other: None IMPRESSION: Negative CT head Preliminary report texted to Dr. Rory Percy Electronically Signed   By: Franchot Gallo M.D.   On: 03/03/2020 11:02   CT Code Stroke CTA Neck W/WO contrast  Result Date: 03/03/2020 CLINICAL DATA:  Aphasia.  Rule out stroke. EXAM: CT ANGIOGRAPHY HEAD AND NECK CT PERFUSION BRAIN TECHNIQUE: Multidetector CT imaging of the head and neck was performed using the standard protocol during bolus administration of intravenous contrast. Multiplanar CT image reconstructions and MIPs were obtained to evaluate the vascular anatomy. Carotid stenosis measurements (when applicable) are obtained utilizing NASCET criteria, using the distal internal carotid diameter as the denominator. Multiphase CT imaging of the brain was performed following IV bolus contrast injection. Subsequent parametric perfusion maps were calculated using RAPID software. CONTRAST:  131mL OMNIPAQUE IOHEXOL 350 MG/ML SOLN COMPARISON:  CT head 03/03/2020  FINDINGS: FINDINGS CTA NECK FINDINGS Aortic arch: Standard branching. Imaged portion shows no evidence of aneurysm or dissection. No significant stenosis of the major arch vessel origins. Right carotid system: The patient moved during scanning through the neck and head. There is significant artifact of the right carotid bifurcation which is not well evaluated. Right internal carotid artery is patent above the  bifurcation. No definite calcification. Left carotid system: Left carotid bifurcation is patent. There is extensive artifact due to motion in the cervical internal carotid artery which is not well evaluated. Distal left cervical internal carotid artery appears normal. Vertebral arteries: Patient moved causing significant artifact. Allowing for this, no vertebral artery abnormality identified. Skeleton: Limited evaluation due to motion. No acute skeletal lesion identified. Other neck: Negative Upper chest: Small left effusion. Mild atelectasis or infiltrate in the right upper lobe posteriorly. Review of the MIP images confirms the above findings CTA HEAD FINDINGS Anterior circulation: Atherosclerotic calcification in the cavernous carotid bilaterally causing mild stenosis. Middle cerebral arteries patent bilaterally without stenosis identified. Anterior cerebral arteries are obscured by motion. Posterior circulation: Both vertebral arteries patent to the basilar. Basilar patent. Posterior cerebral arteries patent bilaterally. Venous sinuses: Normal enhancement Anatomic variants: None Review of the MIP images confirms the above findings CT Brain Perfusion Findings: ASPECTS: 10 CBF (<30%) Volume: 61mL Perfusion (Tmax>6.0s) volume: 93mL Mismatch Volume: 65mL Infarction Location:None IMPRESSION: 1. Images degraded by extensive motion through the carotid bifurcation and through the head. 2. No significant intracranial or extracranial stenosis. Repeat study recommended if symptoms warrant. 3. CT perfusion negative for  acute infarct or ischemia. Electronically Signed   By: Franchot Gallo M.D.   On: 03/03/2020 11:46   MR BRAIN WO CONTRAST  Result Date: 03/05/2020 CLINICAL DATA:  Initial evaluation for acute encephalopathy. Status post cardiac arrest. EXAM: MRI HEAD WITHOUT CONTRAST TECHNIQUE: Multiplanar, multiecho pulse sequences of the brain and surrounding structures were obtained without intravenous contrast. COMPARISON:  Prior CTs from 03/03/2020. FINDINGS: Brain: Examination degraded by motion artifact. Generalized age-related cerebral atrophy. There is subtly increased symmetric T2/FLAIR signal abnormality seen involving the dorsal medial aspects of the thalami (series 11, image 12), nonspecific, but suspected to be related to a degree of anoxic injury given provided history. No associated mass effect or hemorrhage. No other focal parenchymal signal abnormality or evidence for significant cerebral anoxia elsewhere within the brain. No evidence for acute or subacute infarct. Gray-white matter differentiation maintained. No areas of remote cortical infarction. No evidence for acute or chronic intracranial hemorrhage. No mass lesion, midline shift or mass effect. No hydrocephalus or extra-axial fluid collection. Pituitary gland suprasellar region within normal limits. Midline structures intact. Vascular: Major intracranial vascular flow voids are maintained. Skull and upper cervical spine: Craniocervical junction normal. Bone marrow signal intensity within normal limits. No scalp soft tissue abnormality. Sinuses/Orbits: Globes and orbital soft tissues within normal limits. Mild scattered mucosal thickening noted within the paranasal sinuses. No mastoid effusion. Inner ear structures grossly normal. Other: None. IMPRESSION: 1. 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 2. Otherwise normal brain MRI for age. No  other acute intracranial abnormality. Electronically Signed   By: Jeannine Boga M.D.   On: 03/05/2020 00:14   CT Code Stroke Cerebral Perfusion with contrast  Result Date: 03/03/2020 CLINICAL DATA:  Aphasia.  Rule out stroke. EXAM: CT ANGIOGRAPHY HEAD AND NECK CT PERFUSION BRAIN TECHNIQUE: Multidetector CT imaging of the head and neck was performed using the standard protocol during bolus administration of intravenous contrast. Multiplanar CT image reconstructions and MIPs were obtained to evaluate the vascular anatomy. Carotid stenosis measurements (when applicable) are obtained utilizing NASCET criteria, using the distal internal carotid diameter as the denominator. Multiphase CT imaging of the brain was performed following IV bolus contrast injection. Subsequent parametric perfusion maps were calculated  using RAPID software. CONTRAST:  163mL OMNIPAQUE IOHEXOL 350 MG/ML SOLN COMPARISON:  CT head 03/03/2020 FINDINGS: FINDINGS CTA NECK FINDINGS Aortic arch: Standard branching. Imaged portion shows no evidence of aneurysm or dissection. No significant stenosis of the major arch vessel origins. Right carotid system: The patient moved during scanning through the neck and head. There is significant artifact of the right carotid bifurcation which is not well evaluated. Right internal carotid artery is patent above the bifurcation. No definite calcification. Left carotid system: Left carotid bifurcation is patent. There is extensive artifact due to motion in the cervical internal carotid artery which is not well evaluated. Distal left cervical internal carotid artery appears normal. Vertebral arteries: Patient moved causing significant artifact. Allowing for this, no vertebral artery abnormality identified. Skeleton: Limited evaluation due to motion. No acute skeletal lesion identified. Other neck: Negative Upper chest: Small left effusion. Mild atelectasis or infiltrate in the right upper lobe posteriorly.  Review of the MIP images confirms the above findings CTA HEAD FINDINGS Anterior circulation: Atherosclerotic calcification in the cavernous carotid bilaterally causing mild stenosis. Middle cerebral arteries patent bilaterally without stenosis identified. Anterior cerebral arteries are obscured by motion. Posterior circulation: Both vertebral arteries patent to the basilar. Basilar patent. Posterior cerebral arteries patent bilaterally. Venous sinuses: Normal enhancement Anatomic variants: None Review of the MIP images confirms the above findings CT Brain Perfusion Findings: ASPECTS: 10 CBF (<30%) Volume: 2mL Perfusion (Tmax>6.0s) volume: 17mL Mismatch Volume: 4mL Infarction Location:None IMPRESSION: 1. Images degraded by extensive motion through the carotid bifurcation and through the head. 2. No significant intracranial or extracranial stenosis. Repeat study recommended if symptoms warrant. 3. CT perfusion negative for acute infarct or ischemia. Electronically Signed   By: Franchot Gallo M.D.   On: 03/03/2020 11:46   DG Chest Port 1 View  Result Date: 03/04/2020 CLINICAL DATA:  Acute respiratory failure with hypoxia EXAM: PORTABLE CHEST 1 VIEW COMPARISON:  Chest radiograph 03/03/2020 FINDINGS: With stable cardiomediastinal contours with enlarged heart size. There are persistent very mild bilateral interstitial opacities, possibly chronic lung disease. No focal consolidation. No pneumothorax or significant pleural effusion. IMPRESSION: Stable chest with persistent mild bilateral interstitial opacities. Electronically Signed   By: Audie Pinto M.D.   On: 03/04/2020 15:26   DG Chest Port 1 View  Result Date: 03/03/2020 CLINICAL DATA:  Acute respiratory distress. EXAM: PORTABLE CHEST 1 VIEW COMPARISON:  03/01/2020 FINDINGS: Borderline enlarged cardiac silhouette with an interval decrease in size. The endotracheal tube and nasogastric tube have been removed. Mildly decreased prominence of the interstitial  markings with resolved left lung airspace opacities and right lung linear densities. Unremarkable bones. IMPRESSION: 1. Resolved left lung alveolar edema and right lung atelectasis. 2. Improved probable combination of interstitial pulmonary edema and chronic interstitial lung disease. Electronically Signed   By: Claudie Revering M.D.   On: 03/03/2020 10:57   EEG adult  Result Date: 03/03/2020 Lora Havens, MD     03/03/2020  1:50 PM Patient Name: Gedeon Brandow MRN: [id number] Epilepsy Attending: Lora Havens Referring Physician/Provider: Dr Amie [place] Date: 7/08/25/2019 Duration: 23.08 mins Patient history: 64 year old past history of diabetes, hypertension, with no known coronary disease brought in after cardiac arrest requiring 10 minutes of CPR. Had worsening mental status today. EEG to evaluate for seizure. Level of alertness: comatose AEDs during EEG study: None Technical aspects: This EEG study was done with scalp electrodes positioned according to the 10-20 International system of electrode placement. Electrical activity was acquired at a sampling rate of  500Hz  and reviewed with a high frequency filter of 70Hz  and a low frequency filter of 1Hz . EEG data were recorded continuously and digitally stored. Description: EEG showed continuous generalized 5-8 Hz theta slowing as well as 2-3Hz  delta slowing. Hyperventilation and photic stimulation were not performed.   ABNORMALITY -Continuous slow, generalized IMPRESSION: This study is suggestive of severe diffuse encephalopathy, nonspecific etiology. No seizures or epileptiform discharges were seen throughout the recording. Priyanka [name]     Assessment/Plan: Diagnosis: Hypoxic/ischemic encephalopathy following cardiac arrest 1. Does the need for close, 24 hr/day medical supervision in concert with the patient's rehab needs make it unreasonable for this patient to be served in a less intensive setting? Yes 2. Co-Morbidities requiring  supervision/potential complications: Hypertension, diabetes, history of ethanol abuse 3. Due to bladder management, bowel management, safety, skin/wound care, disease management, medication administration, pain management and patient education, does the patient require 24 hr/day rehab nursing? Yes 4. Does the patient require coordinated care of a physician, rehab nurse, therapy disciplines of PT, OT, speech therapy to address physical and functional deficits in the context of the above medical diagnosis(es)? Yes Addressing deficits in the following areas: balance, endurance, locomotion, strength, transferring, bowel/bladder control, bathing, dressing, feeding, toileting, cognition and psychosocial support 5. Can the patient actively participate in an intensive therapy program of at least 3 hrs of therapy per day at least 5 days per week? Currently not but should be able to in several days 6. The potential for patient to make measurable gains while on inpatient rehab is good 7. Anticipated functional outcomes upon discharge from inpatient rehab are min assist  with PT, min assist with OT, min assist with SLP. 8. Estimated rehab length of stay to reach the above functional goals is: 21 to 25 days 9. Anticipated discharge destination: Home 10. Overall Rehab/Functional Prognosis: good  RECOMMENDATIONS: This patient's condition is appropriate for continued rehabilitative care in the following setting: CIR Patient has agreed to participate in recommended program. Potentially Note that insurance prior authorization may be required for reimbursement for recommended care.  Comment: Needs to be able to tolerate PT OT, SLP therapy all in one day day, sit up in recliner 3 hours/day   Bary Leriche, PA-C 03/05/2020   "I have personally performed a face to face diagnostic evaluation of this patient.  Additionally, I have reviewed and concur with the physician assistant's documentation above." Charlett Blake M.D. [facility] FAAPM&R (Neuromuscular Med) Diplomate Am Board of Electrodiagnostic Med Fellow Am Board of Interventional Pain

## 2020-03-05 NOTE — Progress Notes (Signed)
Physical Therapy Treatment Patient Details Name: Richard Hatfield MRN: [id number] DOB: November 29, 1955 Today's Date: 03/05/2020    History of Present Illness Pt adm 7/13 with cardiac arrest. Coronary evaluation revealed diffuse obstructive CAD however no obvious acute thrombus as etiology for arrest. Intubated at the scene and extubated 7/15. PMH - HTN, DM    PT Comments    Pt awake and alert with speech prior to PT/OT session. Pt awake and alert for only a minute or two at first of session. Then pt became lethargic. Sat pt up at EOB where he became more alert and interactive. Pt moving LUE initially but wasn't moving RUE. Then pt not moving any extremity. Pt had 3 bites of applesauce and did fine. On 4th bite pt lethargic again and wouldn't arouse to swallow. Suctioned out applesauce. Pt again became more alert and then again lethargic. Pt continued this pattern of brief periods of eye opening and arousal and then lethargy. BP, SpO2, and HR all stable throughout. Pt returned to supine and at this point would only open eyes very briefly with sternal rub.   Follow Up Recommendations  CIR     Equipment Recommendations  Other (comment) (To be determined)    Recommendations for Other Services       Precautions / Restrictions Precautions Precautions: Fall    Mobility  Bed Mobility Overal bed mobility: Needs Assistance Bed Mobility: Supine to Sit;Sit to Supine     Supine to sit: +2 for physical assistance;Total assist Sit to supine: +2 for physical assistance;Total assist   General bed mobility comments: Assist for all aspects.   Transfers                    Ambulation/Gait                 Stairs             Wheelchair Mobility    Modified Rankin (Stroke Patients Only)       Balance Overall balance assessment: Needs assistance Sitting-balance support: Feet supported;Bilateral upper extremity supported Sitting balance-Leahy Scale: Zero Sitting balance -  Comments: Pt sat EOB x 8-10 minutes with max assist. No balance reactions. When allowed to go posteriorly he would eventually open eyes.                                     Cognition Arousal/Alertness: Lethargic Behavior During Therapy: Flat affect Overall Cognitive Status: Impaired/Different from baseline Area of Impairment: Orientation;Attention;Memory;Following commands;Safety/judgement;Problem solving;Awareness                 Orientation Level: Disoriented to;Time;Situation Current Attention Level: Focused Memory: Decreased short-term memory Following Commands: Follows one step commands inconsistently;Follows one step commands with increased time Safety/Judgement: Decreased awareness of safety;Decreased awareness of deficits Awareness: Intellectual Problem Solving: Slow processing;Decreased initiation;Difficulty sequencing;Requires verbal cues;Requires tactile cues General Comments: Pt lethargic with brief periods of arousal. Waxing and waning multiple times throughout session.       Exercises      General Comments General comments (skin integrity, edema, etc.): VSS even with the waxing and waning level of  arousal      Pertinent Vitals/Pain Pain Assessment: Faces Faces Pain Scale: No hurt    Home Living                      Prior Function  PT Goals (current goals can now be found in the care plan section) Acute Rehab PT Goals Patient Stated Goal: Pt unable Progress towards PT goals: Not progressing toward goals - comment    Frequency    Min 3X/week      PT Plan Current plan remains appropriate    Co-evaluation PT/OT/SLP Co-Evaluation/Treatment: Yes Reason for Co-Treatment: Complexity of the patient's impairments (multi-system involvement);Necessary to address cognition/behavior during functional activity;For patient/therapist safety PT goals addressed during session: Mobility/safety with mobility;Balance         AM-PAC PT "6 Clicks" Mobility   Outcome Measure  Help needed turning from your back to your side while in a flat bed without using bedrails?: Total Help needed moving from lying on your back to sitting on the side of a flat bed without using bedrails?: Total Help needed moving to and from a bed to a chair (including a wheelchair)?: Total Help needed standing up from a chair using your arms (e.g., wheelchair or bedside chair)?: Total Help needed to walk in hospital room?: Total Help needed climbing 3-5 steps with a railing? : Total 6 Click Score: 6    End of Session   Activity Tolerance: Patient limited by lethargy Patient left: in bed;with call bell/phone within reach;with nursing/sitter in room;with bed alarm set Nurse Communication: Mobility status;Other (comment) (lethargy) PT Visit Diagnosis: Other abnormalities of gait and mobility (R26.89);Muscle weakness (generalized) (M62.81);Other symptoms and signs involving the nervous system (R29.898)     Time: 0350-0938 PT Time Calculation (min) (ACUTE ONLY): 18 min  Charges:  $Therapeutic Activity: 8-22 mins                     [place] Pager [phone] Office [place] 03/05/2020, 1:59 PM

## 2020-03-05 NOTE — Progress Notes (Signed)
Signed out to [facility], they will take over as primary at 7 am on 7/20.

## 2020-03-05 NOTE — Evaluation (Signed)
Occupational Therapy Evaluation Patient Details Name: Richard Hatfield MRN: [id number] DOB: October 16, 1955 Today's Date: 03/05/2020    History of Present Illness Pt adm 7/13 with cardiac arrest. Coronary evaluation revealed diffuse obstructive CAD however no obvious acute thrombus as etiology for arrest. Intubated at the scene and extubated 7/15. PMH - HTN, DM   Clinical Impression   This 64 yo male admitted with above presents to acute OT with PLOF of independent and working pta. Currently pt with waxing/waning alertness (supine HOB up and sitting EOB) with total A for all basic ADLs and mobility. He will continue to benefit from acute OT with follow up on CIR.    Follow Up Recommendations  CIR;Supervision/Assistance - 24 hour    Equipment Recommendations  Other (comment) (TBD next venue)    Recommendations for Other Services Rehab consult     Precautions / Restrictions Precautions Precautions: Fall Restrictions Weight Bearing Restrictions: No      Mobility Bed Mobility Overal bed mobility: Needs Assistance Bed Mobility: Supine to Sit;Sit to Supine     Supine to sit: +2 for physical assistance;Total assist Sit to supine: +2 for physical assistance;Total assist   General bed mobility comments: Assist for all aspects.      Balance Overall balance assessment: Needs assistance   Sitting balance-Leahy Scale: Zero Sitting balance - Comments: Pt sat EOB x 8-10 minutes with max assist. No balance reactions. When allowed to go posteriorly he would eventually open eyes.                                    ADL either performed or assessed with clinical judgement   ADL Overall ADL's : Needs assistance/impaired                                       General ADL Comments: Total A     Vision   Additional Comments: Decreased eye opening throughout session            Pertinent Vitals/Pain Pain Assessment: Faces Faces Pain Scale: No hurt         Extremity/Trunk Assessment Upper Extremity Assessment Upper Extremity Assessment: RUE deficits/detail;LUE deficits/detail RUE Deficits / Details: Due to his waxing and waning lethargy it was hard to tell, but just from obsevation he was moving LUE more than RUE--but both were not [facility] RUE Coordination: decreased fine motor;decreased gross motor           Communication Communication Communication: Other (comment) (mumbled, low volume)   Cognition Arousal/Alertness: Lethargic Behavior During Therapy: Flat affect Overall Cognitive Status: Impaired/Different from baseline Area of Impairment: Orientation;Attention;Memory;Following commands;Safety/judgement;Problem solving;Awareness                 Orientation Level: Disoriented to;Time;Situation Current Attention Level: Focused Memory: Decreased short-term memory Following Commands: Follows one step commands inconsistently;Follows one step commands with increased time Safety/Judgement: Decreased awareness of safety;Decreased awareness of deficits Awareness: Intellectual Problem Solving: Slow processing;Decreased initiation;Difficulty sequencing;Requires verbal cues;Requires tactile cues General Comments: Pt lethargic with brief periods of arousal. Waxing and waning multiple times throughout session.               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        OT Problem List: Decreased strength;Decreased range of motion;Decreased activity tolerance;Impaired balance  (sitting and/or standing);Impaired vision/perception;Decreased coordination;Decreased cognition;Decreased safety awareness;Decreased knowledge of use of DME or AE;Impaired UE functional use      OT Treatment/Interventions: Self-care/ADL training;DME and/or AE instruction;Patient/family education;Balance training;Therapeutic exercise;Therapeutic activities    OT Goals(Current goals can be found in the care plan section) Acute Rehab OT Goals Patient Stated Goal: Pt unable OT Goal Formulation: Patient unable to participate in goal setting Time For Goal Achievement: 03/19/20 Potential to Achieve Goals: Fair  OT Frequency: Min 2X/week           Co-evaluation PT/OT/SLP Co-Evaluation/Treatment: Yes Reason for Co-Treatment: Complexity of the patient's impairments (multi-system involvement);Necessary to address cognition/behavior during functional activity;For patient/therapist safety PT goals addressed during session: Mobility/safety with mobility;Balance OT goals addressed during session: ADL's and self-care;Strengthening/ROM      AM-PAC OT "6 Clicks" Daily Activity     Outcome Measure Help from another person eating meals?: Total Help from another person taking care of personal grooming?: Total Help from another person toileting, which includes using toliet, bedpan, or urinal?: Total Help from another person bathing (including washing, rinsing, drying)?: Total Help from another person to put on and taking off regular upper body clothing?: Total Help from another person to put on and taking off regular lower body clothing?: Total 6 Click Score: 6   End of Session Nurse Communication:  (RN was observing our session from outside room and came into room x1)  Activity Tolerance: Patient limited by lethargy Patient left: in bed;with call bell/phone within reach;with bed alarm set;with nursing/sitter in room  OT Visit Diagnosis: Other abnormalities of gait and mobility (R26.89);Muscle  weakness (generalized) (M62.81);Other symptoms and signs involving cognitive function                Time: 5747-3403 OT Time Calculation (min): 16 min Charges:  OT General Charges $OT Visit: 1 Visit OT Evaluation $OT Eval Moderate Complexity: 1 Mod  [place], OTR/L Acute [employer] Pager [phone] Office [phone]     Almon Register 03/05/2020, 6:24 PM

## 2020-03-05 NOTE — Progress Notes (Signed)
ANTICOAGULATION CONSULT NOTE  Pharmacy Consult for heparin Indication: CAD awaiting CVTS consult  No Known Allergies  Patient Measurements: Height: 5\' 8"  (172.7 cm) Weight: 84 kg (185 lb 3 oz) IBW/kg (Calculated) : 68.4 Heparin Dosing Weight: 90kg  Vital Signs: Temp: 98.3 F (36.8 C) (07/19 0502) Temp Source: Axillary (07/19 0502) BP: 156/89 (07/19 0600) Pulse Rate: 73 (07/19 0600)  Labs: Recent Labs    03/03/20 0930 03/03/20 0930 03/04/20 0243 03/05/20 0215  HGB 14.3   < > 14.9 15.4  HCT 42.8  --  43.7 47.7  PLT 225  --  169 237  HEPARINUNFRC 0.38  --  0.62 0.55  CREATININE 1.02  --  0.99 0.88   < > = values in this interval not displayed.    Estimated Creatinine Clearance: 93 mL/min (by C-G formula based on SCr of 0.88 mg/dL).   Assessment: 64yo male had witnessed VF arrest, called as code STEMI and sent emergently to cath lab which revealed diffuse multivessel CAD >> awaiting CVTS consult.  Heparin resumed post-cath for possible surgical revascularization.  With mental status changes on 7/17, heparin was stopped for CT to r/o head bleed. Head CT negative, thought to be metabolic encephalopathy. Heparin resumed, targeting lower goal since ACS event was 5 days ago and pt is CP free.  Heparin level 0.55, CBC wnl.   Goal of Therapy:  Heparin level 0.3-0.5 units/ml Monitor platelets by anticoagulation protocol: Yes   Plan:  Decrease heparin infusion to 1600 units/hr Daily heparin level and CBC   Arrie Senate, PharmD, BCPS Clinical Pharmacist [phone] Please check AMION for all [place] numbers 03/05/2020

## 2020-03-05 NOTE — Progress Notes (Signed)
Inpatient Diabetes Program Recommendations  AACE/ADA: New Consensus Statement on Inpatient Glycemic Control (2015)  Target Ranges:  Prepandial:   less than 140 mg/dL      Peak postprandial:   less than 180 mg/dL (1-2 hours)      Critically ill patients:  140 - 180 mg/dL   Lab Results  Component Value Date   GLUCAP 280 (H) 03/05/2020   HGBA1C 8.2 (H) 02/28/2020    Review of Glycemic Control Results for Richard Hatfield, Richard Hatfield (MRN [id number]) as of 03/05/2020 11:17  Ref. Range 03/04/2020 17:05 03/04/2020 19:37 03/05/2020 00:03 03/05/2020 05:00 03/05/2020 08:33  Glucose-Capillary Latest Ref Range: 70 - 99 mg/dL 146 (H) 251 (H) 168 (H) 159 (H) 280 (H)    Current orders for Inpatient glycemic control:  Novolog 0-9 units q4H   Inpatient Diabetes Program Recommendations:     Since patient is eating please consider,  Carb modified diet  Novolog 0-9 units tid  Novolog 0-5 units qhs  Will continue to follow while inpatient.  Thank you, Reche Dixon, RN, BSN Diabetes Coordinator Inpatient Diabetes Program [phone] (team pager from 8a-5p)

## 2020-03-05 NOTE — Progress Notes (Signed)
Nutrition Follow-up  DOCUMENTATION CODES:   Not applicable  INTERVENTION:  Provide Ensure Enlive po BID, each supplement provides 350 kcal and 20 grams of protein  Encourage adequate PO intake.   NUTRITION DIAGNOSIS:   Increased nutrient needs related to acute illness as evidenced by estimated needs; ongoing  GOAL:   Patient will meet greater than or equal to 90% of their needs; progressing  MONITOR:   Vent status, Skin, TF tolerance, Weight trends, Labs, I & O's  REASON FOR ASSESSMENT:   Ventilator    ASSESSMENT:   Patient with PMH significant for CAD. Presents this admission s/p cardiac arrest. 7/13- heart cath, no stenting. Extubated 7/15.   Diet has been advanced to a soft diet with thin liquids. Meal completion has been 75-100%. Per notes, pt confused. Per neurology, pt likely with hypoxic encephalopathy. Pt asleep during time of visit and did not awaken to RD visit. RD to order nutritional supplements to aid in caloric and protein needs.   Labs and medications reviewed.   Diet Order:   Diet Order            DIET SOFT Room service appropriate? Yes; Fluid consistency: Thin  Diet effective now                 EDUCATION NEEDS:   Not appropriate for education at this time  Skin:  Skin Assessment: Reviewed RN Assessment  Last BM:  7/17  Height:   Ht Readings from Last 1 Encounters:  02/28/20 5\' 8"  (1.727 m)    Weight:   Wt Readings from Last 1 Encounters:  03/05/20 84 kg   BMI:  Body mass index is 28.16 kg/m.  Estimated Nutritional Needs:   Kcal:  2100-2300  Protein:  105-120 grams  Fluid:  >/= 2 L/day  Corrin Parker, MS, RD, LDN RD pager number/after hours weekend pager number on Amion.

## 2020-03-05 NOTE — Progress Notes (Addendum)
NEUROLOGY PROGRESS NOTE  Subjective: Patient has no significant complaints.  Exam: Vitals:   03/05/20 0600 03/05/20 0835  BP: (!) 156/89   Pulse: 73   Resp: 19   Temp:  98 F (36.7 C)  SpO2: 99%     Physical Exam  Constitutional: Appears well-developed and well-nourished.  Psych: Slow to respond without agitation Eyes: No scleral injection HENT: No OP obstrucion Head: Normocephalic.  Cardiovascular: Normal rate and regular rhythm.  Respiratory: Effort normal, non-labored breathing GI: Soft.  No distension. There is no tenderness.  Skin: WDI   Neuro:  Mental Status: Alert but response is slow. Oriented to hospital initially not to [facility] however after asking a second time he was orientated [place]. Speech dysarthric without evidence of aphasia.  Able to follow simple commands without difficulty. Cranial Nerves: II:  Visual fields grossly normal,  III,IV, VI: ptosis not present, extra-ocular motions intact bilaterally pupils equal, round, reactive to light and accommodation V,VII: smile symmetric, facial light touch sensation normal bilaterally VIII: hearing normal bilaterally IX,X: Palate rises midline XI: bilateral shoulder shrug XII: midline tongue extension Motor: R moving all extremities antigravity with good strength.  He does show slight asterixis when arms are held out straight. Sensory: Pinprick and light touch intact throughout, bilaterally Cerebellar: normal finger-to-nose   Medications:  Scheduled: . aspirin  81 mg Oral Daily  . Chlorhexidine Gluconate Cloth  6 each Topical Daily  . hydrALAZINE  20 mg Intravenous Q8H  . insulin aspart  0-9 Units Subcutaneous Q4H  . mouth rinse  15 mL Mouth Rinse BID  . metoprolol tartrate  5 mg Intravenous Q6H  . polyethylene glycol  17 g Oral Daily  . senna-docusate  1 tablet Oral BID  . sodium chloride flush  3 mL Intravenous Q12H   Continuous: . sodium chloride 10 mL/hr at 02/28/20 0144  . sodium chloride  10 mL/hr at 03/05/20 0941  . famotidine (PEPCID) IV 20 mg (03/05/20 0942)  . heparin 1,650 Units/hr (03/05/20 0600)    Pertinent Labs/Diagnostics:  Sodium 148 BUN 34    CT HEAD WO CONTRAST  Result Date: 03/03/2020 CLINICAL DATA:  Encephalopathy. EXAM: CT HEAD WITHOUT CONTRAST TECHNIQUE: Contiguous axial images were obtained from the base of the skull through the vertex without intravenous contrast. COMPARISON:  CT head 02/28/2020 FINDINGS: Brain: No evidence of acute infarction, hemorrhage, hydrocephalus, extra-axial collection or mass lesion/mass effect. Vascular: Negative for hyperdense vessel Skull: Negative Sinuses/Orbits: Mild mucosal edema paranasal sinuses. Negative orbit Other: None IMPRESSION: Negative CT head Preliminary report texted to Dr. Rory Percy Electronically Signed   By: Franchot Gallo M.D.   On: 03/03/2020 11:02   CT Code Stroke CTA Neck W/WO contrast  Result Date: 03/03/2020  IMPRESSION: 1. Images degraded by extensive motion through the carotid bifurcation and through the head. 2. No significant intracranial or extracranial stenosis. Repeat study recommended if symptoms warrant. 3. CT perfusion negative for acute infarct or ischemia. Electronically Signed   By: Franchot Gallo M.D.   On: 03/03/2020 11:46   MR BRAIN WO CONTRAST  Result Date: 03/05/2020  IMPRESSION: 1. 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 2. Otherwise normal brain MRI for age. No other acute intracranial abnormality. Electronically Signed   By: Jeannine Boga M.D.   On: 03/05/2020 00:14   CT Code Stroke Cerebral Perfusion with contrast  Result Date: 03/03/2020  IMPRESSION: 1. Images degraded by extensive motion through  the carotid bifurcation and through the head. 2. No significant intracranial or extracranial stenosis. Repeat study recommended if symptoms warrant. 3. CT perfusion  negative for acute infarct or ischemia. Electronically Signed   By: Franchot Gallo M.D.   On: 03/03/2020 11:46   EEG adult  Result Date: 03/03/2020 IMPRESSION: This study is suggestive of severe diffuse encephalopathy, nonspecific etiology. No seizures or epileptiform discharges were seen throughout the recording. Priyanka Cipriano Mile PA-C [facility] [phone]  Assessment:  64 year old male with past medical history of diabetes, hypertension, no known coronary artery disease who was brought to the hospital after V. tach cardiac arrest requiring 10 minutes of CPR with cardioversion and 2 rounds of epinephrine.  Emergent cardiac catheterization only found mild vessel disease with diffuse vasospasms.  While in the ICU patient showed decreased level of consciousness and there was concern for stroke versus ICH since he was on a heparin drip non-STEMI.  Exam at that time neurologically was nonfocal.  CT CTA unremarkable for evidence of evolving infarct or LVO.  Overnight patient became agitated.  Last exam showed poor attention concentration but followed commands and was nonfocal otherwise.  EEG was unremarkable for seizures.  MRI however did show T2/flair signal abnormality involving the thalami which is favored to reflect a degree of anoxic brain injury provided the history.  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 Correction of toxic metabolic derangements per primary team   03/05/2020, 9:50 AM   NEUROHOSPITALIST ADDENDUM Performed a face to face diagnostic evaluation on 7/20.   I have reviewed the contents of history and physical exam as documented by PA/ARNP/Resident and agree with above documentation.  I have discussed and formulated the above plan as documented. Edits to the note have been made as needed.  On my examination, patient is alert and oriented x3, can name the president.  Patient is  aware that he is in the hospital and he had a heart attack.  Motor strength 5 x 5 in all four extremities, visual fields intact.  Patient status post cardiac arrest-code stroke activated for being lethargic and drowsy.  CT head was unremarkable.  Felt to be HIE, MRI brain confirms bilateral signal abnormality in the thalami indicating some degree of anoxic brain injury.  EEG negative for seizures.  Impression Hypoxic ischemic encephalopathy  Continue supportive treatment. Neurology will be available for any further questions    Damareon Lanni MD [facility] [phone]   If 7pm to 7am, please call on call as listed on AMION.

## 2020-03-05 NOTE — Progress Notes (Signed)
  Speech Language Pathology Treatment: Dysphagia  Patient Details Name: Richard Hatfield MRN: [id number] DOB: 01/25/1956 Today's Date: 03/05/2020 Time: 4497-5300 SLP Time Calculation (min) (ACUTE ONLY): 17 min  Assessment / Plan / Recommendation Clinical Impression  Pt alert and participatory; confused, stating he is at [facility] with intermittent verbal cues needed to focus and sustain attention to eating and self-feeding.  Physical support required to bring cup and graham cracker to lips.  Pt demonstrated adequate mastication, brisk swallow response, and passed three-oz water trial with no overt s/s of aspiration.  There was adequate respiratory/swallow reciprocity.  Doubt he is aspirating.  Recommend advancing diet to regular solids (HH) and thin liquids.  Meds whole with water; if he coughs, give meds whole with puree.  SLP f/u not needed for swallowing; however, pt would benefit from cognitive/language evaluation. D/W RN.   HPI HPI: 64yo male admitted 02/28/20 after V. tach cardiac arrest requiring 10 minutes of CPR with cardioversion and 2 rounds of epinephrine.  PMH: HTN, HLD, DM2. Pt intubated 7/13-15/21. MRI however did show T2/flair signal abnormality involving the thalami which is favored to reflect a degree of anoxic brain injury provided the history.        SLP Plan  All goals met       Recommendations  Diet recommendations: Regular;Thin liquid Liquids provided via: Cup;Straw Medication Administration: Whole meds with liquid Supervision: Staff to assist with self feeding                Oral Care Recommendations: Oral care BID Follow up Recommendations: None SLP Visit Diagnosis: Dysphagia, oral phase (R13.11) Plan: All goals met       GO               Richard Hatfield, [place] Office number [phone] Pager [phone]  Richard Hatfield 03/05/2020, 10:44 AM

## 2020-03-05 NOTE — Progress Notes (Addendum)
Progress Note  Patient Name: Richard Hatfield Date of Encounter: 03/05/2020  [facility] Cardiologist:  Claiborne Billings  Subjective   Much less confused and restless than yesterday, little bit more alert.  Now able to take p.o.  Does require redirecting.  Still not fully clear from a neurologic standpoint.  Inpatient Medications    Scheduled Meds: . amLODipine  5 mg Oral Daily  . aspirin  81 mg Oral Daily  . Chlorhexidine Gluconate Cloth  6 each Topical Daily  . [START ON 03/06/2020] clopidogrel  75 mg Oral Daily  . [START ON 03/06/2020] feeding supplement (GLUCERNA SHAKE)  237 mL Oral BID BM  . insulin aspart  0-15 Units Subcutaneous TID WC  . insulin aspart  0-5 Units Subcutaneous QHS  . mouth rinse  15 mL Mouth Rinse BID  . metoprolol tartrate  50 mg Oral BID  . polyethylene glycol  17 g Oral Daily  . rosuvastatin  40 mg Oral Daily  . senna-docusate  1 tablet Oral BID  . sodium chloride flush  3 mL Intravenous Q12H   Continuous Infusions: . sodium chloride 10 mL/hr at 02/28/20 0144  . sodium chloride Stopped (03/05/20 1540)  . famotidine (PEPCID) IV Stopped (03/05/20 1012)   PRN Meds: sodium chloride, acetaminophen (TYLENOL) oral liquid 160 mg/5 mL, diphenhydrAMINE, hydrALAZINE, metoprolol tartrate, ondansetron (ZOFRAN) IV, Resource ThickenUp Clear, sodium chloride flush   Vital Signs    Vitals:   03/05/20 1600 03/05/20 1700 03/05/20 1741 03/05/20 2058  BP: 117/69 124/69 136/86 (!) 142/85  Pulse: 85 61 62 63  Resp: 19 (!) 22 19 20   Temp:   97.6 F (36.4 C) 99.5 F (37.5 C)  TempSrc:   Oral Oral  SpO2: 97% 99% 98% 99%  Weight:      Height:        Intake/Output Summary (Last 24 hours) at 03/05/2020 2342 Last data filed at 03/05/2020 2149 Gross per 24 hour  Intake 2929.63 ml  Output 3975 ml  Net -1045.37 ml   Last 3 Weights 03/05/2020 03/04/2020 03/02/2020  Weight (lbs) 185 lb 3 oz 189 lb 9.5 oz 202 lb 2.6 oz  Weight (kg) 84 kg 86 kg 91.7 kg      Telemetry      Continued sinus rhythm with PVCs.  Rare couplets and triplets.- Personally Reviewed  ECG    No a.m. EKG - Personally Reviewed  Physical Exam   General: Well nourished, well developed.  NAD.  Extremely confused. HEENT: [place]/AT/EOMI SKIN: warm, dry. No rashes. Neuro: Nonfocal from a movement perspective, but from a cognitive state, it seems to be quite confused with encephalopathy. Musculoskeletal: Moves all extremities Psychiatric: Difficult to assess, he seems to be very confused.  He knows he is in the hospital, but not sure where.   He is aware of COVID-19, but not sure of the year.  Unable to tell me who the president is. Neck: No JVD or arms. Lungs: CTA B, nonlabored, good air movement.  Mild interstitial sounds but no wheezes rales or rhonchi. Cardiovascular: Regular rate and rhythm with ectopy.  No obvious M/R/E. Abdomen: Soft/NT/ND/NABS.  No HSM. Extremities: No C/C/E.  2+ pulses bilateral pedal  Labs    High Sensitivity Troponin:   Recent Labs  Lab 02/28/20 0126 02/28/20 0543  TROPONINIHS 60* 992*      Chemistry Recent Labs  Lab 02/28/20 0126 02/28/20 0223 02/29/20 0056 03/01/20 0558 03/03/20 0930 03/04/20 0243 03/05/20 0215  NA 137   < > 143   < >  143 147* 148*  K 3.8   < > 5.3*   < > 3.5 4.7 4.0  CL 105   < > 109   < > 103 109 111  CO2 15*   < > 24   < > 23 23 23   GLUCOSE 268*   < > 184*   < > 113* 134* 153*  BUN 20   < > 21   < > 28* 35* 34*  CREATININE 1.45*   < > 1.08   < > 1.02 0.99 0.88  CALCIUM 8.7*   < > 8.1*   < > 9.9 9.9 10.1  PROT 6.6  --  6.6  --   --   --   --   ALBUMIN 3.9  --  3.7  --   --   --   --   AST 135*  --  80*  --   --   --   --   ALT 100*  --  86*  --   --   --   --   ALKPHOS 41  --  27*  --   --   --   --   BILITOT 0.4  --  0.5  --   --   --   --   GFRNONAA 52*   < > >60   < > >60 >60 >60  GFRAA 60*   < > >60   < > >60 >60 >60  ANIONGAP 17*   < > 10   < > 17* 15 14   < > = values in this interval not displayed.      Hematology Recent Labs  Lab 03/03/20 0930 03/04/20 0243 03/05/20 0215  WBC 9.2 8.8 8.8  RBC 4.47 4.56 4.80  HGB 14.3 14.9 15.4  HCT 42.8 43.7 47.7  MCV 95.7 95.8 99.4  MCH 32.0 32.7 32.1  MCHC 33.4 34.1 32.3  RDW 11.6 11.8 11.9  PLT 225 169 237    BNPNo results for input(s): BNP, PROBNP in the last 168 hours.   DDimer No results for input(s): DDIMER in the last 168 hours.   Radiology    MR BRAIN WO CONTRAST  Result Date: 03/05/2020 CLINICAL DATA:  Initial evaluation for acute encephalopathy. Status post cardiac arrest. EXAM: MRI HEAD WITHOUT CONTRAST TECHNIQUE: Multiplanar, multiecho pulse sequences of the brain and surrounding structures were obtained without intravenous contrast. COMPARISON:  Prior CTs from 03/03/2020. FINDINGS: Brain: Examination degraded by motion artifact. Generalized age-related cerebral atrophy. There is subtly increased symmetric T2/FLAIR signal abnormality seen involving the dorsal medial aspects of the thalami (series 11, image 12), nonspecific, but suspected to be related to a degree of anoxic injury given provided history. No associated mass effect or hemorrhage. No other focal parenchymal signal abnormality or evidence for significant cerebral anoxia elsewhere within the brain. No evidence for acute or subacute infarct. Gray-white matter differentiation maintained. No areas of remote cortical infarction. No evidence for acute or chronic intracranial hemorrhage. No mass lesion, midline shift or mass effect. No hydrocephalus or extra-axial fluid collection. Pituitary gland suprasellar region within normal limits. Midline structures intact. Vascular: Major intracranial vascular flow voids are maintained. Skull and upper cervical spine: Craniocervical junction normal. Bone marrow signal intensity within normal limits. No scalp soft tissue abnormality. Sinuses/Orbits: Globes and orbital soft tissues within normal limits. Mild scattered mucosal thickening noted  within the paranasal sinuses. No mastoid effusion. Inner ear structures grossly normal. Other: None. IMPRESSION: 1. Subtle symmetric T2/FLAIR signal  abnormality involving the thalami, nonspecific, but favored to reflect a degree of anoxic brain injury given provided history. 2. Otherwise normal brain MRI for age. No other acute intracranial abnormality. Electronically Signed   By: Jeannine Boga M.D.   On: 03/05/2020 00:14   DG Chest Port 1 View  Result Date: 03/04/2020 CLINICAL DATA:  Acute respiratory failure with hypoxia EXAM: PORTABLE CHEST 1 VIEW COMPARISON:  Chest radiograph 03/03/2020 FINDINGS: With stable cardiomediastinal contours with enlarged heart size. There are persistent very mild bilateral interstitial opacities, possibly chronic lung disease. No focal consolidation. No pneumothorax or significant pleural effusion. IMPRESSION: Stable chest with persistent mild bilateral interstitial opacities. Electronically Signed   By: Audie Pinto M.D.   On: 03/04/2020 15:26    Cardiac Studies   Echo 02/28/20: Mid-distal anterolateral wall not well visualized.  Consider repeat echo with contrast.  Estimated EF 50 to 55%.  Moderate mild concentric LVH.  "Normal diastolic parameters ".  Mildly increased pulmonary arterial systolic pressure.  Mild LA dilation.  Trivial MR.  Mild aortic valve stenosis.   LEFT HEART CATH AND CORONARY ANGIOGRAPHY  Conclusion   Prox LAD to Mid LAD lesion is 60% stenosed. Mid LAD lesion is 80% stenosed. 1st Diag lesion is 75% stenosed.    Prox Cx lesion is 70% stenosed. Prox Cx to Mid Cx lesion is 80% stenosed.    Prox RCA-1 lesion is 20% stenosed. Prox RCA-2 lesion is 70% stenosed. RV Branch lesion is 90% stenosed.  Dist RCA-1 lesion is 80% stenosed. Dist RCA-2 lesion is 85% stenosed. RPDA lesion is 30% stenosed.   There is mild left ventricular systolic dysfunction.The left ventricular ejection fraction is 45-50% by visual estimate.  LV end  diastolic pressure is normal.    Out of hospital witnessed VF cardiac arrest with return of ROSC after approximately 20 minutes of CPR and administration of 2 doses of epinephrine.  Significant three-vessel CAD with 60% diffuse proximal LAD stenosis, long diffuse 70% diagonal stenosis and 80% LAD stenosis after the first diagonal vessel; 70 to 80% proximal diffuse circumflex stenosis before a large marginal branch; and very large dominant RCA with 70% proximal stenosis and long diffuse 80 and 85% stenoses beyond the acute margin proximal to the PDA takeoff with mild 30% narrowing in the PDA.  Mild acute LV dysfunction with focal anterolateral hypocontractility and EF estimated 45 to 50%. LVEDP 12 mm  RECOMMENDATION: Suspect transient coronary vasospasm involving the LAD circulation in the etiology of the patient's VF cardiac arrest. Low-dose IV nitroglycerin was started at the end of the catheterization procedure. Patient will be transported to to heart and evaluated by critical care with consideration for hypothermia due to witnessed cardiac arrest. A 2D echo Doppler study will be obtained. We will review angios with colleagues but with diffuse multivessel CAD in this diabetic male consider possible surgical consultation for CABG revascularization following stability.    Patient Profile     64 y.o. male with out of hospital cardiac arrest. 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 No PCI performed  Assessment & Plan    1. Cardiac arrest with severe three vessel CAD: LV function preserved on echo.  -While he clearly has multivessel disease and warrants CVTS consultation for CABG, at this current state his neurologic recovery is not progressing well but he is extremely confused and I do not think would be a very good candidate for either surgery or PCI given his level of encephalopathy.  At this time, with ACS  presentation, would actually recommend loading with Plavix and treating  with daily 75 mg (if and when decision is made to consider CVTS consultation, it would not be in an emergent situation and would probably not likely be during this hospitalization therefore we could allow for Plavix washout.  Restart statin pending LFT recovery  As he is now able to tolerate p.o., will convert to oral beta-blocker (converting from 5 mg IV every 6 hours -> dose would be 50 mg p.o. twice daily)  With the concern for possible spasm, I do agree with amlodipine.  Would start on 5 mg daily and titrate based on blood pressure.  2. NSVT: Convert to oral metoprolol with as needed IV for tachycardia and hypertension.  3. Encephalopathy. Hypoxic/metabolic. Plans per CCM/TRH/Neuro.    From a disposition standpoint, he clearly no longer meets ICU criteria and is being transitioned to [facility] with consideration for possible inpatient rehab.  We will monitor his pressures and telemetry and follow along for now.   For questions or updates, please contact [place] Please consult [email or web address] for contact info under        Signed, Glenetta Hew, MD  03/05/2020, 11:42 PM

## 2020-03-06 ENCOUNTER — Encounter (HOSPITAL_COMMUNITY): Payer: Self-pay | Admitting: Specialist

## 2020-03-06 LAB — CBC
HCT: 43.6 % (ref 39.0–52.0)
Hemoglobin: 14.4 g/dL (ref 13.0–17.0)
MCH: 32.4 pg (ref 26.0–34.0)
MCHC: 33 g/dL (ref 30.0–36.0)
MCV: 98.2 fL (ref 80.0–100.0)
Platelets: 224 10*3/uL (ref 150–400)
RBC: 4.44 MIL/uL (ref 4.22–5.81)
RDW: 11.5 % (ref 11.5–15.5)
WBC: 9.6 10*3/uL (ref 4.0–10.5)
nRBC: 0 % (ref 0.0–0.2)

## 2020-03-06 LAB — COMPREHENSIVE METABOLIC PANEL
ALT: 44 U/L (ref 0–44)
AST: 51 U/L — ABNORMAL HIGH (ref 15–41)
Albumin: 2.9 g/dL — ABNORMAL LOW (ref 3.5–5.0)
Alkaline Phosphatase: 36 U/L — ABNORMAL LOW (ref 38–126)
Anion gap: 11 (ref 5–15)
BUN: 24 mg/dL — ABNORMAL HIGH (ref 8–23)
CO2: 24 mmol/L (ref 22–32)
Calcium: 9.7 mg/dL (ref 8.9–10.3)
Chloride: 107 mmol/L (ref 98–111)
Creatinine, Ser: 0.93 mg/dL (ref 0.61–1.24)
GFR calc Af Amer: >60 mL/min (ref >60)
GFR calc non Af Amer: >60 mL/min (ref >60)
Glucose, Bld: 183 mg/dL — ABNORMAL HIGH (ref 70–99)
Potassium: 4.1 mmol/L (ref 3.5–5.1)
Sodium: 142 mmol/L (ref 135–145)
Total Bilirubin: 1.3 mg/dL — ABNORMAL HIGH (ref 0.3–1.2)
Total Protein: 6.5 g/dL (ref 6.5–8.1)

## 2020-03-06 LAB — GLUCOSE, CAPILLARY
Glucose-Capillary: 179 mg/dL — ABNORMAL HIGH (ref 70–99)
Glucose-Capillary: 167 mg/dL — ABNORMAL HIGH (ref 70–99)
Glucose-Capillary: 195 mg/dL — ABNORMAL HIGH (ref 70–99)
Glucose-Capillary: 191 mg/dL — ABNORMAL HIGH (ref 70–99)

## 2020-03-06 MED ORDER — IRBESARTAN 75 MG PO TABS
37.5000 mg | ORAL_TABLET | Freq: Every day | ORAL | Status: DC
  Administered 2020-03-06 – 2020-03-08 (×3): 37.5 mg via ORAL
  Filled 2020-03-06 (×3): qty 1

## 2020-03-06 MED ORDER — LISINOPRIL 5 MG PO TABS: 5.0000 mg | ORAL_TABLET | Freq: Every day | ORAL | Status: DC

## 2020-03-06 MED ORDER — ALUM & MAG HYDROXIDE-SIMETH 200-200-20 MG/5ML PO SUSP
30.0000 mL | Freq: Four times a day (QID) | ORAL | Status: DC | PRN
  Administered 2020-03-06 – 2020-03-07 (×2): 30 mL via ORAL
  Filled 2020-03-06 (×2): qty 30

## 2020-03-06 NOTE — Progress Notes (Signed)
  Speech Language Pathology Treatment: Cognitive-Linquistic  Patient Details Name: Richard Hatfield MRN: [id number] DOB: 1956-03-02 Today's Date: 03/06/2020 Time: 3428-7681 SLP Time Calculation (min) (ACUTE ONLY): 18 min  Assessment / Plan / Recommendation Clinical Impression  Pt was seen for cognitive-linguistic treatment with his wife present. Pt initially denied having any difficulty with any tasks completed, but towards the end of the session he stated, "a heart attack messes with all that stuff like your memory. Pt and his wife were educated regarding the results of the evaluation and recommendations. Pt's wife was educated regarding the fact that, considering his memory deficits, she need not correct him constantly regarding his deceased family members. She verbalized agreement and indicated that she did question whether this was beneficial. He achieved 80% accuracy with a medication management (prescription) task increasing to 100% with cues. He required cueing for recall of information provided during the session and completed a mental manipulation task with 100% accuracy when repetition was given. SLP will continue to follow pt.    HPI HPI: 64yo male admitted 02/28/20 after V. tach cardiac arrest requiring 10 minutes of CPR with cardioversion and 2 rounds of epinephrine.  PMH: HTN, HLD, DM2. Pt intubated 7/13-15/21. MRI however did show T2/flair signal abnormality involving the thalami which is favored to reflect a degree of anoxic brain injury provided the history.        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 Hardin Negus, [place], [place] Office number [phone] Pager [place] 03/06/2020, 11:51 AM

## 2020-03-06 NOTE — Progress Notes (Addendum)
Progress Note  Patient Name: Richard Hatfield Date of Encounter: 03/06/2020  [facility] Cardiologist: No primary care provider on file.   Subjective   Patient still confused. Could tell me his name and birthday but thought we were at his aunts house and could not tell me what brought him in. No chest pain or shortness of breath.  Inpatient Medications    Scheduled Meds: . amLODipine  5 mg Oral Daily  . aspirin  81 mg Oral Daily  . Chlorhexidine Gluconate Cloth  6 each Topical Daily  . clopidogrel  75 mg Oral Daily  . feeding supplement (GLUCERNA SHAKE)  237 mL Oral BID BM  . insulin aspart  0-15 Units Subcutaneous TID WC  . insulin aspart  0-5 Units Subcutaneous QHS  . mouth rinse  15 mL Mouth Rinse BID  . metoprolol tartrate  50 mg Oral BID  . polyethylene glycol  17 g Oral Daily  . rosuvastatin  40 mg Oral Daily  . senna-docusate  1 tablet Oral BID  . sodium chloride flush  3 mL Intravenous Q12H   Continuous Infusions: . sodium chloride 10 mL/hr at 02/28/20 0144  . sodium chloride Stopped (03/05/20 1540)   PRN Meds: sodium chloride, acetaminophen (TYLENOL) oral liquid 160 mg/5 mL, diphenhydrAMINE, hydrALAZINE, metoprolol tartrate, ondansetron (ZOFRAN) IV, Resource ThickenUp Clear, sodium chloride flush   Vital Signs    Vitals:   03/05/20 1741 03/05/20 2058 03/06/20 0016 03/06/20 0554  BP: 136/86 (!) 142/85 (!) 162/77 (!) 142/78  Pulse: 62 63 (!) 57 (!) 55  Resp: 19 20 16 16   Temp: 97.6 F (36.4 C) 99.5 F (37.5 C) 98.1 F (36.7 C) 98.3 F (36.8 C)  TempSrc: Oral Oral Oral Oral  SpO2: 98% 99% 99% 94%  Weight:    86.5 kg  Height:        Intake/Output Summary (Last 24 hours) at 03/06/2020 0800 Last data filed at 03/06/2020 3149 Gross per 24 hour  Intake 3113.79 ml  Output 2900 ml  Net 213.79 ml   Last 3 Weights 03/06/2020 03/05/2020 03/04/2020  Weight (lbs) 190 lb 11.2 oz 185 lb 3 oz 189 lb 9.5 oz  Weight (kg) 86.5 kg 84 kg 86 kg      Telemetry      Sinus rhythm with rate in the 50's to 60's. PVCs. - Personally Reviewed  ECG    No new ECG tracing today.- Personally Reviewed  Physical Exam   GEN: No acute distress.   Neck: Supple. Cardiac: RRR. III/VI systolic murmur best heard at upper sternal border. No rubs or gallops.  Respiratory: Clear to auscultation bilaterally. GI: Soft, nontender, non-distended  MS: No lower extremity edema; No deformity. Neuro:  No focal deficits. Still confused. Continue sllurred speech. Psych: Normal affect   Labs    High Sensitivity Troponin:   Recent Labs  Lab 02/28/20 0126 02/28/20 0543  TROPONINIHS 60* 992*      Chemistry Recent Labs  Lab 02/29/20 0056 03/01/20 0558 03/04/20 0243 03/05/20 0215 03/06/20 0519  NA 143   < > 147* 148* 142  K 5.3*   < > 4.7 4.0 4.1  CL 109   < > 109 111 107  CO2 24   < > 23 23 24   GLUCOSE 184*   < > 134* 153* 183*  BUN 21   < > 35* 34* 24*  CREATININE 1.08   < > 0.99 0.88 0.93  CALCIUM 8.1*   < > 9.9 10.1 9.7  PROT 6.6  --   --   --  6.5  ALBUMIN 3.7  --   --   --  2.9*  AST 80*  --   --   --  51*  ALT 86*  --   --   --  44  ALKPHOS 27*  --   --   --  36*  BILITOT 0.5  --   --   --  1.3*  GFRNONAA >60   < > >60 >60 >60  GFRAA >60   < > >60 >60 >60  ANIONGAP 10   < > 15 14 11    < > = values in this interval not displayed.     Hematology Recent Labs  Lab 03/04/20 0243 03/05/20 0215 03/06/20 0519  WBC 8.8 8.8 9.6  RBC 4.56 4.80 4.44  HGB 14.9 15.4 14.4  HCT 43.7 47.7 43.6  MCV 95.8 99.4 98.2  MCH 32.7 32.1 32.4  MCHC 34.1 32.3 33.0  RDW 11.8 11.9 11.5  PLT 169 237 224    BNPNo results for input(s): BNP, PROBNP in the last 168 hours.   DDimer No results for input(s): DDIMER in the last 168 hours.   Radiology    MR BRAIN WO CONTRAST  Result Date: 03/05/2020 CLINICAL DATA:  Initial evaluation for acute encephalopathy. Status post cardiac arrest. EXAM: MRI HEAD WITHOUT CONTRAST TECHNIQUE: Multiplanar, multiecho pulse sequences  of the brain and surrounding structures were obtained without intravenous contrast. COMPARISON:  Prior CTs from 03/03/2020. FINDINGS: Brain: Examination degraded by motion artifact. Generalized age-related cerebral atrophy. There is subtly increased symmetric T2/FLAIR signal abnormality seen involving the dorsal medial aspects of the thalami (series 11, image 12), nonspecific, but suspected to be related to a degree of anoxic injury given provided history. No associated mass effect or hemorrhage. No other focal parenchymal signal abnormality or evidence for significant cerebral anoxia elsewhere within the brain. No evidence for acute or subacute infarct. Gray-white matter differentiation maintained. No areas of remote cortical infarction. No evidence for acute or chronic intracranial hemorrhage. No mass lesion, midline shift or mass effect. No hydrocephalus or extra-axial fluid collection. Pituitary gland suprasellar region within normal limits. Midline structures intact. Vascular: Major intracranial vascular flow voids are maintained. Skull and upper cervical spine: Craniocervical junction normal. Bone marrow signal intensity within normal limits. No scalp soft tissue abnormality. Sinuses/Orbits: Globes and orbital soft tissues within normal limits. Mild scattered mucosal thickening noted within the paranasal sinuses. No mastoid effusion. Inner ear structures grossly normal. Other: None. IMPRESSION: 1. 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 2. Otherwise normal brain MRI for age. No other acute intracranial abnormality. Electronically Signed   By: Jeannine Boga M.D.   On: 03/05/2020 00:14    Cardiac Studies   Left Heart Catheterization 02/28/2020:  Prox LAD to Mid LAD lesion is 60% stenosed.  1st Diag lesion is 75% stenosed.  Mid LAD lesion is 80% stenosed.  Prox Cx lesion is  70% stenosed. Prox Cx to Mid Cx lesion is 80% stenosed.  Prox RCA-1 lesion is 20% stenosed. Prox RCA-2 lesion is 70% stenosed.  RV Branch lesion is 90% stenosed.  Dist RCA-1 lesion is 80% stenosed. Dist RCA-2 lesion is 85% stenosed.  RPDA lesion is 30% stenosed.  The left ventricular ejection fraction is 45-50% by visual estimate.  LV end diastolic pressure is normal.  There is mild left ventricular systolic dysfunction.  Out of hospital witnessed VF cardiac arrest with return of ROSC after approximately 20 minutes of CPR and administration of 2 doses of epinephrine.  Significant three-vessel CAD with 60% diffuse proximal LAD stenosis, long diffuse 70% diagonal stenosis and 80% LAD stenosis after the first diagonal vessel; 70 to 80% proximal diffuse circumflex stenosis before a large marginal branch; and very large dominant RCA with 70% proximal stenosis and long diffuse 80 and 85% stenoses beyond the acute margin proximal to the PDA takeoff with mild 30% narrowing in the PDA.  Mild acute LV dysfunction with focal anterolateral hypocontractility and EF estimated 45 to 50%. LVEDP 12 mm  Recommendation: Suspect transient coronary vasospasm involving the LAD circulation in the etiology of the patient's VF cardiac arrest. Low-dose IV nitroglycerin was started at the end of the catheterization procedure. Patient will be transported to to heart and evaluated by critical care with consideration for hypothermia due to witnessed cardiac arrest. A 2D echo Doppler study will be obtained. We will review angios with colleagues but with diffuse multivessel CAD in this diabetic male consider possible surgical consultation for CABG revascularization following stability. _______________  Echocardiogram 02/28/2020: Impressions: 1. Normal wall motion in visualized segments. Mid to distal anterolateral  wall not well visualized on apical images. Consider repeat with echo  contrast for wall motion when  patient less agitated.. Left ventricular  ejection fraction, by estimation, is 50  to 55%. The left ventricle has low normal function. The left ventricle  has no regional wall motion abnormalities. There is mild concentric left  ventricular hypertrophy. Left ventricular diastolic parameters were  normal.  2. Right ventricular systolic function is normal. The right ventricular  size is normal. There is mildly elevated pulmonary artery systolic  pressure.  3. Left atrial size was mildly dilated.  4. The mitral valve is normal in structure. Trivial mitral valve  regurgitation. No evidence of mitral stenosis.  5. The aortic valve has an indeterminant number of cusps. Aortic valve  regurgitation is not visualized. Mild aortic valve stenosis.    Patient Profile     64 y.o. male with a history of hypertension and diabetes who presented with out of hospital cardiac arrest on 02/28/2020.  Assessment & Plan    Cardiac Arrest with Severe 3-Vessel CAD - Patient presented with out of hospital witnessed VF cardiac arrest with return of ROSC after about 20 minutes of CPR and 2 doses of Epi.  - Cath on 7/13 showed severe 3-vessel CAD involving LAD, RCA, and LCX.  - Echo showed LVEF of 50-55% with normal wall motion in visualized segments (but mid to distal anterolateral wall not well visualized).  - He has been very encephalopathic and plan is for CT Surgery consultation for possible CABG once encephalopathy has improved. Patient still confused at this time. - Continue DAPT with Aspirin and Plavix (started yesterday). Continue beta-blocker and high-intensity statin.  Non-Sustained VT - Occasional PVCs noted on telemetry.  - Continue Lopressor 50mg  twice daily -that was started yesterday.  Hypertension - BP mildly elevated. - Patient on Valsartan 320mg  daily at home. Will start on Irbesartan here (since we do not have Valsartan on formulary).  Encephalopathy - Felt to be hypoxic/metabolic in  nature. - Canagement per primary team and Neuro.  --Is being evaluated by inpatient rehab  Aspiration Pneumonia - Completed 5 days of Ceftriaxone. - Afebrile and WBC normal. - Management per primary team.  For questions or updates, please contact [place] Please consult [email or web address] for contact info  under        Signed, Darreld Mclean, PA-C  03/06/2020, 8:00 AM     ATTENDING ATTESTATION  I have seen, examined and evaluated the patient this AM along with Sande Rives, PA.  After reviewing all the available data and chart, we discussed the patients laboratory, study & physical findings as well as symptoms in detail. I agree with her findings, examination as well as impression recommendations as per our discussion.    Attending adjustments noted in italics.   Dvonte is awake and alert, but completely disoriented.  He needs to be redirected is for [place] his thought process continuously.  He is confused about the last several days and course of events.  He thinks he has had several heart attacks.  As it stands he actually is looking relatively stable from a cardiovascular standpoint and has not been complaining of chest pain or pressure.  No arrhythmias noted.  We are simply titrating medications as discussed with plans to treat medically for now and reevaluate pending restoration of normal mental status..  Disposition: At this point, he is not a favorable candidate for CVTS consultation and or high risk PCI.  I had a discussion with the patient's wife.  She herself does not think that he is in any state of mind to be out of go forward with a big surgery.  We need to see how he progresses from a cognitive standpoint in order to determine the appropriateness of when he actually goes for revascularization. As such, we will treat him by optimizing medications.  We have added Plavix with the understanding that there is no plan for now to consider urgent CABG.  It may be that this is  something that is even done in the outpatient setting.  We will continue to follow along while he is inpatient and we are titrating medications.  He will need to be followed up with general cardiology as an outpatient.   Glenetta Hew, M.D., M.S. Interventional Cardiologist   Pager # [phone] Phone # [phone] [address] [place], [place] [address]

## 2020-03-06 NOTE — Evaluation (Signed)
Speech Language Pathology Evaluation Patient Details Name: Richard Hatfield MRN: [id number] DOB: Apr 06, 1956 Today's Date: 03/06/2020 Time: 1324-4010 SLP Time Calculation (min) (ACUTE ONLY): 15 min  Problem List:  Patient Active Problem List   Diagnosis Date Noted  . Encephalopathy acute   . ACS (acute coronary syndrome) ([place]) 02/28/2020  . Cardiac arrest ([place])   . Type 2 diabetes mellitus with complication, without long-term current use of insulin ([facility])    Past Medical History:  Past Medical History:  Diagnosis Date  . Cardiac arrest ([place]) 02/28/2020  . Diabetes mellitus without complication ([place])   . Hypertension    Past Surgical History:  Past Surgical History:  Procedure Laterality Date  . LEFT HEART CATH AND CORONARY ANGIOGRAPHY N/A 02/28/2020   Procedure: LEFT HEART CATH AND CORONARY ANGIOGRAPHY;  Surgeon: Troy Sine, MD;  Location: [facility];  Service: Cardiovascular;  Laterality: N/A;   HPI:  64yo male admitted 02/28/20 after V. tach cardiac arrest requiring 10 minutes of CPR with cardioversion and 2 rounds of epinephrine.  PMH: HTN, HLD, DM2. Pt intubated 7/13-15/21. MRI however did show T2/flair signal abnormality involving the thalami which is favored to reflect a degree of anoxic brain injury provided the history.     Assessment / Plan / Recommendation Clinical Impression  Pt participated in speech/language/cognition evaluation. 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 He denied any baseline or acute deficits in speech, language or cognition. However, his wife stated that he has been "very disoriented" today and believes that all his deceased relatives are alive. [facility] Mental Status Examination was completed to evaluate the pt's cognitive-linguistic skills. He achieved a score of 14/30 which is below the normal limits of 27 or more out of 30. He exhibited difficulty in the areas of awareness, orientation, attention,   memory, complex problem solving, and executive function. Pt's articulatory precision was reduced, but his wife stated that his speech is "one hundred times better than yesterday" and is now at baseline. Skilled SLP services are clinically indicated at this time to improve cognitive-linguistic function.    SLP Assessment  SLP Recommendation/Assessment: Patient needs continued Speech Lanaguage Pathology Services SLP Visit Diagnosis: Cognitive communication deficit (R41.841)    Follow Up Recommendations  Inpatient Rehab    Frequency and Duration min 2x/week  2 weeks      SLP Evaluation Cognition  Overall Cognitive Status: Impaired/Different from baseline Arousal/Alertness: Awake/alert Orientation Level: Oriented to person;Disoriented to place;Disoriented to time;Disoriented to situation (Month: July; Date: 97; Year: 2012; State: [place]) Attention: Focused;Sustained Focused Attention: Appears intact Sustained Attention: Impaired Sustained Attention Impairment: Verbal complex Memory: Impaired Memory Impairment: Retrieval deficit;Decreased recall of new information (Immediate: 5/5; delayed: 1/5; with cues: 2/4) Awareness: Impaired Awareness Impairment: Intellectual impairment Problem Solving: Impaired Problem Solving Impairment: Verbal complex Executive Function: Reasoning;Sequencing;Organizing Sequencing: Impaired Sequencing Impairment: Verbal complex (Clock drawing: 2/4) Organizing: Appears intact (Backward digit span: 3/3)       Comprehension  Auditory Comprehension Overall Auditory Comprehension: Appears within functional limits for tasks assessed Yes/No Questions: Within Functional Limits Commands: Within Functional Limits Conversation: Complex Reading Comprehension Reading Status: Within funtional limits    Expression Expression Primary Mode of Expression: Verbal Verbal Expression Overall Verbal Expression: Appears within functional limits for tasks assessed Initiation: No  impairment Level of Generative/Spontaneous Verbalization: Conversation Repetition: No impairment Pragmatics: Impairment Impairments: Abnormal affect   Oral / Motor  Oral Motor/Sensory Function Overall Oral Motor/Sensory Function: Generalized oral weakness Motor Speech  Overall Motor Speech: Impaired at baseline Respiration: Within functional limits Phonation: Normal Resonance: Within functional limits Articulation: Impaired Level of Impairment: Conversation Intelligibility: Intelligibility reduced Word: 75-100% accurate Phrase: 75-100% accurate Sentence: 75-100% accurate Conversation: 75-100% accurate Motor Planning: Witnin functional limits Motor Speech Errors: Not applicable   Daneka Lantigua I. Hardin Negus, [place], [place] Office number [phone] Pager [place] 03/06/2020, 11:31 AM

## 2020-03-06 NOTE — Progress Notes (Signed)
Inpatient Rehabilitation Admissions Coordinator  Inpatient rehab assessment with patient and his wife at bedside. I discussed goals and expectations of a possible inpt rehab admit.Wife prefers inpt rehab admit before d/c home. I will begin insurance authorization for a possible CIR admit.    Danne Baxter, RN, MSN Rehab Admissions Coordinator [phone] 03/06/2020 11:22 AM

## 2020-03-06 NOTE — Progress Notes (Signed)
PROGRESS NOTE    Richard Hatfield  XNA:[id number] DOB: 05-May-1956 DOA: 02/28/2020 PCP: System, Pcp Not In   Brief Narrative: Patient is a 64 year old male without known history of coronary artery disease who was brought to the emergency department after he had cardiac arrest.  He complained of chest pain on the day of admission and had cardiac arrest.  His wife attempted CPR.  EMS continued CPR for about 10 minutes with 2 cardioversions and 20 (doses with reversal of circulation.  He was intubated in emergency department and emergently taken to Cath Lab.  He was found to have mild diffuse disease with vasospasm.  Cardiology consulted.  Plan for CABG.  Patient was transferred to [place] from [facility] on 03/03/2020. On 03/03/20 morning , patient became  apneic, in respiratory distress with tachypnea.  Patient was transferred back to ICU.  Currently he is hemodynamically stable and has been transferred to our service today.  Assessment & Plan:   Active Problems:   Cardiac arrest [facility])   ACS (acute coronary syndrome) ([place])   Type 2 diabetes mellitus with complication, without long-term current use of insulin (HCC)   Encephalopathy acute   Cardiac arrest: Presented here with witnessed V. fib cardiac arrest with reversal of circulation after about 20 minutes of CPR and 2 doses of epi.  Cardiac  catheterization showed three-vessel disease involving LAD, circumflex, RCA.  Echo showed ejection fraction of 50 to 55% without regional wall motion abnormality. Plan is for CABG after further improvement in the mental status.  He is mostly alert, awake and oriented but his mental status is waxing and waning.  I discussed with cardiology in detail today, the plan is to send him to the rehab first and he will be assessed by cardiothoracic surgery team for CABG thereafter.  Continue on statin, aspirin, plavix,beta-blocker.  Nonsustained V. tach: Continue metoprolol 50 mg twice daily.  Debility/deconditioning: PT/OT  evaluated the patient and recommended CIR.  Aspiration pneumonia: Completed antibiotics course.  Hypertension: On amlodipine,avapro.  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  Neurology was following. MRI also showed changes from enoxaparin injury but no acute intracranial abnormalities.  His mental status has much more improved.  Currently he is alert and oriented but his mental status waxes and wanes.  Intermittent confusion.    Nutrition Problem: Increased nutrient needs Etiology: acute illness      DVT prophylaxis:SCD Code Status: Full Family Communication: Discussed with wife on phone today. Status is: Inpatient  Remains inpatient appropriate because:Hemodynamically unstable   Dispo: The patient is from: Home              Anticipated d/c is to: CIR              Anticipated d/c date is: On the day of bed availability           Patient is hemodynamically stable for discharge to CIR.      Consultants: Cardiology, PCCM  Procedures: Intubation  Antimicrobials:  Anti-infectives (From admission, onward)   Start     Dose/Rate Route Frequency Ordered Stop   02/29/20 1100  cefTRIAXone (ROCEPHIN) 2 g in sodium chloride 0.9 % 100 mL IVPB        2 g 200 mL/hr over 30 Minutes Intravenous Every 24 hours 02/29/20 1027 03/04/20 1039      Subjective: Patient seen and examined at the bedside this morning.  Looks much more better today than 3 days ago when I saw him.  Hemodynamically stable.  Comfortable.  Alert and awake and obeys commands but somewhat slow.  Communicates.  He told me that today is Tuesday which is correct.  Objective: Vitals:   03/05/20 2058 03/06/20 0016 03/06/20 0554 03/06/20 0805  BP: (!) 142/85 (!) 162/77 (!) 142/78 139/72  Pulse: 63 (!) 57 (!) 55 60  Resp: 20 16 16  (!) 21  Temp: 99.5 F (37.5 C) 98.1 F (36.7 C) 98.3 F (36.8 C)  98 F (36.7 C)  TempSrc: Oral Oral Oral Oral  SpO2: 99% 99% 94% 96%  Weight:   86.5 kg   Height:        Intake/Output Summary (Last 24 hours) at 03/06/2020 0848 Last data filed at 03/06/2020 0653 Gross per 24 hour  Intake 3113.79 ml  Output 2900 ml  Net 213.79 ml   Filed Weights   03/04/20 0530 03/05/20 0600 03/06/20 0554  Weight: 86 kg 84 kg 86.5 kg    Examination:  General exam: Comfortable, resting on the bed HEENT:PERRL,Oral mucosa moist, Ear/Nose normal on gross exam Respiratory system: Bilateral equal air entry, normal vesicular breath sounds, no wheezes or crackles  Cardiovascular system: S1 & S2 heard, RRR. No JVD, murmurs, rubs, gallops or clicks. Gastrointestinal system: Abdomen is nondistended, soft and nontender. No organomegaly or masses felt. Normal bowel sounds heard. Central nervous system: Alert and awake. No focal neurological deficits.  Orientation waxes and wanes Extremities: No edema, no clubbing ,no cyanosis Skin: No rashes, lesions or ulcers,no icterus ,no pallor   Data Reviewed: I have personally reviewed following labs and imaging studies  CBC: Recent Labs  Lab 03/02/20 0445 03/03/20 0930 03/04/20 0243 03/05/20 0215 03/06/20 0519  WBC 8.8 9.2 8.8 8.8 9.6  HGB 12.4* 14.3 14.9 15.4 14.4  HCT 37.8* 42.8 43.7 47.7 43.6  MCV 96.9 95.7 95.8 99.4 98.2  PLT 183 225 169 237 161   Basic Metabolic Panel: Recent Labs  Lab 02/29/20 0056 02/29/20 0056 03/01/20 0558 03/01/20 0558 03/02/20 0445 03/03/20 0930 03/04/20 0243 03/05/20 0215 03/06/20 0519  NA 143   < > 145   < > 147* 143 147* 148* 142  K 5.3*   < > 5.0   < > 3.5 3.5 4.7 4.0 4.1  CL 109   < > 108   < > 103 103 109 111 107  CO2 24   < > 26   < > 29 23 23 23 24   GLUCOSE 184*   < > 139*   < > 109* 113* 134* 153* 183*  BUN 21   < > 24*   < > 25* 28* 35* 34* 24*  CREATININE 1.08   < > 1.01   < > 1.01 1.02 0.99 0.88 0.93  CALCIUM 8.1*   < > 8.8*   < > 9.7 9.9 9.9 10.1 9.7  MG 2.3  --   2.8*  --  2.3 2.2 2.6*  --   --   PHOS 5.3*  --  3.7  --  2.4* 3.4  --   --   --    < > = values in this interval not displayed.   GFR: Estimated Creatinine Clearance: 89.2 mL/min (by C-G formula based on SCr of 0.93 mg/dL). Liver Function Tests: Recent Labs  Lab 02/29/20 0056 03/06/20 0519  AST 80* 51*  ALT 86* 44  ALKPHOS 27* 36*  BILITOT 0.5 1.3*  PROT 6.6 6.5  ALBUMIN 3.7 2.9*   No results for input(s): LIPASE, AMYLASE in the last 168 hours. Recent Labs  Lab 03/03/20 1146 03/04/20 0915  AMMONIA 46* 31   Coagulation Profile: Recent Labs  Lab 02/28/20 2006  INR 1.1   Cardiac Enzymes: No results for input(s): CKTOTAL, CKMB, CKMBINDEX, TROPONINI in the last 168 hours. BNP (last 3 results) No results for input(s): PROBNP in the last 8760 hours. HbA1C: No results for input(s): HGBA1C in the last 72 hours. CBG: Recent Labs  Lab 03/05/20 0833 03/05/20 1249 03/05/20 1602 03/05/20 2055 03/06/20 0730  GLUCAP 280* 240* 256* 192* 179*   Lipid Profile: No results for input(s): CHOL, HDL, LDLCALC, TRIG, CHOLHDL, LDLDIRECT in the last 72 hours. Thyroid Function Tests: Recent Labs    03/03/20 1146  TSH 2.920   Anemia Panel: Recent Labs    03/03/20 1146  VITAMINB12 951*   Sepsis Labs: No results for input(s): PROCALCITON, LATICACIDVEN in the last 168 hours.  Recent Results (from the past 240 hour(s))  SARS Coronavirus 2 by RT PCR (hospital order, performed in [facility] hospital lab) Nasopharyngeal Nasopharyngeal Swab     Status: None   Collection Time: 02/28/20  1:33 AM   Specimen: Nasopharyngeal Swab  Result Value Ref Range Status   SARS Coronavirus 2 NEGATIVE NEGATIVE Final    Comment: (NOTE) SARS-CoV-2 target nucleic acids are NOT DETECTED.  The SARS-CoV-2 RNA is generally detectable in upper and lower respiratory specimens during the acute phase of infection. The lowest concentration of SARS-CoV-2 viral copies this assay can detect is 250 copies /  mL. A negative result does not preclude SARS-CoV-2 infection and should not be used as the sole basis for treatment or other patient management decisions.  A negative result may occur with improper specimen collection / handling, submission of specimen other than nasopharyngeal swab, presence of viral mutation(s) within the areas targeted by this assay, and inadequate number of viral copies (<250 copies / mL). A negative result must be combined with clinical observations, patient history, and epidemiological information.  Fact Sheet for Patients:   StrictlyIdeas.no  Fact Sheet for Healthcare Providers: BankingDealers.co.za  This test is not yet approved or  cleared by the [place] FDA and has been authorized for detection and/or diagnosis of SARS-CoV-2 by FDA under an Emergency Use Authorization (EUA).  This EUA will remain in effect (meaning this test can be used) for the duration of the COVID-19 declaration under Section 564(b)(1) of the Act, 21 U.S.C. section 360bbb-3(b)(1), unless the authorization is terminated or revoked sooner.  Performed at [facility], [place] [address] [place], [place] [address]   MRSA PCR Screening     Status: None   Collection Time: 02/28/20  4:08 AM   Specimen: Nasopharyngeal  Result Value Ref Range Status   MRSA by PCR NEGATIVE NEGATIVE Final    Comment:        The GeneXpert MRSA Assay (FDA approved for NASAL specimens only), is one component of a comprehensive MRSA colonization surveillance program. It is not intended to diagnose MRSA infection nor to guide or monitor treatment for MRSA infections. Performed at [facility], [place] [address], [place],  [address]          Radiology Studies: MR BRAIN WO CONTRAST  Result Date: 03/05/2020 CLINICAL DATA:  Initial evaluation for acute encephalopathy. Status post cardiac arrest. EXAM: MRI HEAD WITHOUT CONTRAST TECHNIQUE:  Multiplanar, multiecho pulse sequences of the brain and surrounding structures were obtained without  intravenous contrast. COMPARISON:  Prior CTs from 03/03/2020. FINDINGS: Brain: Examination degraded by motion artifact. Generalized age-related cerebral atrophy. There is subtly increased symmetric T2/FLAIR signal abnormality seen involving the dorsal medial aspects of the thalami (series 11, image 12), nonspecific, but suspected to be related to a degree of anoxic injury given provided history. No associated mass effect or hemorrhage. No other focal parenchymal signal abnormality or evidence for significant cerebral anoxia elsewhere within the brain. No evidence for acute or subacute infarct. Gray-white matter differentiation maintained. No areas of remote cortical infarction. No evidence for acute or chronic intracranial hemorrhage. No mass lesion, midline shift or mass effect. No hydrocephalus or extra-axial fluid collection. Pituitary gland suprasellar region within normal limits. Midline structures intact. Vascular: Major intracranial vascular flow voids are maintained. Skull and upper cervical spine: Craniocervical junction normal. Bone marrow signal intensity within normal limits. No scalp soft tissue abnormality. Sinuses/Orbits: Globes and orbital soft tissues within normal limits. Mild scattered mucosal thickening noted within the paranasal sinuses. No mastoid effusion. Inner ear structures grossly normal. Other: None. IMPRESSION: 1. 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 2. Otherwise normal brain MRI for age. No other acute intracranial abnormality. Electronically Signed   By: Jeannine Boga M.D.   On: 03/05/2020 00:14        Scheduled Meds: . amLODipine  5 mg Oral Daily  . aspirin  81 mg Oral Daily  . Chlorhexidine Gluconate Cloth  6 each Topical Daily  . clopidogrel  75  mg Oral Daily  . feeding supplement (GLUCERNA SHAKE)  237 mL Oral BID BM  . insulin aspart  0-15 Units Subcutaneous TID WC  . insulin aspart  0-5 Units Subcutaneous QHS  . irbesartan  37.5 mg Oral Daily  . mouth rinse  15 mL Mouth Rinse BID  . metoprolol tartrate  50 mg Oral BID  . polyethylene glycol  17 g Oral Daily  . rosuvastatin  40 mg Oral Daily  . senna-docusate  1 tablet Oral BID  . sodium chloride flush  3 mL Intravenous Q12H   Continuous Infusions: . sodium chloride 10 mL/hr at 02/28/20 0144  . sodium chloride Stopped (03/05/20 1540)     LOS: 7 days    Time spent: 35 mins.More than 50% of that time was spent in counseling and/or coordination of care.      Shelly Coss, MD [facility] P7/20/2021, 8:48 AM

## 2020-03-07 ENCOUNTER — Other Ambulatory Visit: Payer: Self-pay | Admitting: *Deleted

## 2020-03-07 DIAGNOSIS — I249 Acute ischemic heart disease, unspecified: Secondary | ICD-10-CM

## 2020-03-07 DIAGNOSIS — I2510 Atherosclerotic heart disease of native coronary artery without angina pectoris: Secondary | ICD-10-CM

## 2020-03-07 LAB — CBC
HCT: 43.6 % (ref 39.0–52.0)
Hemoglobin: 14.8 g/dL (ref 13.0–17.0)
MCH: 32.6 pg (ref 26.0–34.0)
MCHC: 33.9 g/dL (ref 30.0–36.0)
MCV: 96 fL (ref 80.0–100.0)
Platelets: 237 10*3/uL (ref 150–400)
RBC: 4.54 MIL/uL (ref 4.22–5.81)
RDW: 11.3 % — ABNORMAL LOW (ref 11.5–15.5)
WBC: 10.4 10*3/uL (ref 4.0–10.5)
nRBC: 0 % (ref 0.0–0.2)

## 2020-03-07 LAB — GLUCOSE, CAPILLARY
Glucose-Capillary: 153 mg/dL — ABNORMAL HIGH (ref 70–99)
Glucose-Capillary: 294 mg/dL — ABNORMAL HIGH (ref 70–99)
Glucose-Capillary: 207 mg/dL — ABNORMAL HIGH (ref 70–99)
Glucose-Capillary: 199 mg/dL — ABNORMAL HIGH (ref 70–99)

## 2020-03-07 NOTE — Progress Notes (Signed)
Physical Therapy Treatment Patient Details Name: Richard Hatfield MRN: [id number] DOB: 09-20-1955 Today's Date: 03/07/2020    History of Present Illness Pt adm 7/13 with cardiac arrest. Coronary evaluation revealed diffuse obstructive CAD however no obvious acute thrombus as etiology for arrest. Intubated at the scene and extubated 7/15. PMH - HTN, DM    PT Comments    Pt is awake, alert and eager to work with therapy. Pt with stream of consciousness narration throughout session confusing past and present. Able to state he is in hospital for heart attack, but not aware of his current deficits. Pt asked to sit EoB and requires increased cuing to sequence but able to get himself to EoB without physical assist but requires min A to attain stability once he is there. Worked on sitting balance for approximately 5 minutes. Once pt able to self steady, worked with pt to transfer with RW to recliner. Pt requires modAx2 with maximal multimodal cuing for sequencing and RW use to ambulate from bed to chair. Pt has wife is committed to her husbands care and is will to provide 24 hour care when he comes home. That paired with pt's desire to return to prior level of function continue to make him a perfect candidate for CIR level rehab. PT will continue to follow acutely.   Follow Up Recommendations  CIR     Equipment Recommendations  Other (comment) (To be determined)       Precautions / Restrictions Precautions Precautions: Fall Restrictions Weight Bearing Restrictions: No    Mobility  Bed Mobility Overal bed mobility: Needs Assistance Bed Mobility: Supine to Sit     Supine to sit: Min assist     General bed mobility comments: pt able to move LE off bed and push to upright but requires min A for steadying in seated, lots of sway in sitting, no LoB  Transfers Overall transfer level: Needs assistance Equipment used: Rolling walker (2 wheeled) Transfers: Sit to/from Stand Sit to Stand: Mod  assist         General transfer comment: min A for power up but mod A for steadying in standing  maximal multimodal cuing for hand placement and sequencing  Ambulation/Gait Ambulation/Gait assistance: Mod assist;+2 physical assistance Gait Distance (Feet): 3 Feet Assistive device: Rolling walker (2 wheeled) Gait Pattern/deviations: Step-through pattern;Decreased step length - right;Decreased step length - left;Shuffle Gait velocity: slowed Gait velocity interpretation: <1.8 ft/sec, indicate of risk for recurrent falls General Gait Details: modA physical assist and maximal mulitmodal cuing for sequencing stepping forward, turning and backing up to the chair, unfamiliar with RW use and sequencing requires increased cuing       Balance Overall balance assessment: Needs assistance Sitting-balance support: Feet supported;Bilateral upper extremity supported Sitting balance-Leahy Scale: Poor Sitting balance - Comments: able to sit EoB for 5 minutes, progressing from min A for steadying to min guard for safety given increased sway in all directions                                     Cognition Arousal/Alertness: Awake/alert Behavior During Therapy: WFL for tasks assessed/performed Overall Cognitive Status: Impaired/Different from baseline Area of Impairment: Orientation;Attention;Memory;Following commands;Safety/judgement;Problem solving;Awareness                 Orientation Level: Disoriented to;Time;Situation Current Attention Level: Selective Memory: Decreased short-term memory Following Commands: Follows multi-step commands consistently;Follows one step commands consistently Safety/Judgement: Decreased awareness  of safety;Decreased awareness of deficits Awareness: Intellectual Problem Solving: Difficulty sequencing;Requires verbal cues;Requires tactile cues General Comments: Pt lethargic with brief periods of arousal. Waxing and waning multiple times  throughout session.          General Comments General comments (skin integrity, edema, etc.): VSS  Pt's wife present throughout session.      Pertinent Vitals/Pain Pain Assessment: No/denies pain Faces Pain Scale: No hurt    Home Living     Available Help at Discharge: Family;Available 24 hours/day (wife would have to take FMLA)                    PT Goals (current goals can now be found in the care plan section) Acute Rehab PT Goals PT Goal Formulation: With family Time For Goal Achievement: 03/16/20 Potential to Achieve Goals: Good Progress towards PT goals: Progressing toward goals    Frequency    Min 3X/week      PT Plan Current plan remains appropriate       AM-PAC PT "6 Clicks" Mobility   Outcome Measure  Help needed turning from your back to your side while in a flat bed without using bedrails?: A Little Help needed moving from lying on your back to sitting on the side of a flat bed without using bedrails?: A Little Help needed moving to and from a bed to a chair (including a wheelchair)?: A Lot Help needed standing up from a chair using your arms (e.g., wheelchair or bedside chair)?: Total Help needed to walk in hospital room?: Total Help needed climbing 3-5 steps with a railing? : Total 6 Click Score: 11    End of Session Equipment Utilized During Treatment: Gait belt Activity Tolerance: Patient tolerated treatment well Patient left: in chair;with chair alarm set;with nursing/sitter in room;with family/visitor present Nurse Communication: Mobility status PT Visit Diagnosis: Other abnormalities of gait and mobility (R26.89);Muscle weakness (generalized) (M62.81);Other symptoms and signs involving the nervous system (R29.898);Difficulty in walking, not elsewhere classified (R26.2)     Time: 9211-9417 PT Time Calculation (min) (ACUTE ONLY): 27 min  Charges:  $Gait Training: 8-22 mins $Therapeutic Activity: 8-22 mins                      Woodfin Kiss B. Migdalia Dk PT, DPT Acute Rehabilitation Services Pager [phone] Office [phone]    [place] 03/07/2020, 1:42 PM

## 2020-03-07 NOTE — Progress Notes (Addendum)
PROGRESS NOTE    Richard Hatfield  YCX:[id number] DOB: 1956/07/05 DOA: 02/28/2020 PCP: System, Pcp Not In   Brief Narrative: Patient is a 64 year old male without known history of coronary artery disease who was brought to the emergency department after he had cardiac arrest.  He complained of chest pain on the day of admission and had cardiac arrest.  His wife attempted CPR.  EMS continued CPR for about 10 minutes with 2 cardioversions and 20 (doses with reversal of circulation.  He was intubated in emergency department and emergently taken to Cath Lab. Cardiology consulted.  Plan for CABG.  Patient was transferred to [place] from [facility] on 03/03/2020. On 03/03/20 morning , patient became  apneic, in respiratory distress with tachypnea.  Patient was transferred back to ICU.  Currently he is hemodynamically stable and has been transferred to our service on 03/06/20  Assessment & Plan:   Active Problems:   Cardiac arrest [facility])   ACS (acute coronary syndrome) ([place])   Type 2 diabetes mellitus with complication, without long-term current use of insulin (HCC)   Encephalopathy acute   Cardiac arrest 3V CAD for possible CABG Presented here with witnessed V. fib cardiac arrest with reversal of circulation after about 20 minutes of CPR and 2 doses of epi Cardiac catheterization showed three-vessel disease involving LAD, circumflex, RCA.  Echo showed ejection fraction of 50 to 55% without regional wall motion abnormality. Cardiology consulted, plan is for CABG after further improvement in the mental status CIR first and then re-evaluate for possible CABG Awaiting Life-vest  Continue on statin, aspirin, plavix,beta-blocker  Anoxic brain injury/encephalopathy Mental status improving Likely from cardiac arrest MRI showed changes from anoxic injury, but no acute intracranial abnormalities Neurology consulted Monitor closely  Nonsustained V. Tach Continue metoprolol 50 mg twice  daily  Debility/deconditioning Awaiting CIR  Aspiration pneumonia Completed antibiotics course  Hypertension Continue amlodipine,avapro    Nutrition Problem: Increased nutrient needs Etiology: acute illness      DVT prophylaxis:SCD Code Status: Full Family Communication: None at bedside Status is: Inpatient  Remains inpatient appropriate because:Hemodynamically unstable and Inpatient level of care appropriate due to severity of illness   Dispo: The patient is from: Home              Anticipated d/c is to: CIR              Anticipated d/c date is: Likely on 03/08/20           Patient is hemodynamically stable for discharge to CIR.      Consultants: Cardiology, PCCM  Procedures: Intubation  Antimicrobials:  Anti-infectives (From admission, onward)   Start     Dose/Rate Route Frequency Ordered Stop   02/29/20 1100  cefTRIAXone (ROCEPHIN) 2 g in sodium chloride 0.9 % 100 mL IVPB        2 g 200 mL/hr over 30 Minutes Intravenous Every 24 hours 02/29/20 1027 03/04/20 1039      Subjective: Patient seen and examined at bedside, noted to be oriented, denies any new complaints.  Objective: Vitals:   03/07/20 0420 03/07/20 0806 03/07/20 1016 03/07/20 1221  BP: 129/69 132/83  130/70  Pulse: (!) 57 65 (!) 59 (!) 58  Resp: 18 20  20   Temp: 99.2 F (37.3 C) 98.1 F (36.7 C)  98.2 F (36.8 C)  TempSrc: Oral Oral  Oral  SpO2: 95% 95%  98%  Weight: 86.6 kg     Height:        Intake/Output Summary (  Last 24 hours) at 03/07/2020 1855 Last data filed at 03/07/2020 5284 Gross per 24 hour  Intake 610 ml  Output 1950 ml  Net -1340 ml   Filed Weights   03/05/20 0600 03/06/20 0554 03/07/20 0420  Weight: 84 kg 86.5 kg 86.6 kg    Examination:  General: NAD  Cardiovascular: S1, S2 present  Respiratory: CTAB  Abdomen: Soft, nontender, nondistended, bowel sounds present  Musculoskeletal: No bilateral pedal edema noted  Skin: Normal  Psychiatry: Normal  mood   Data Reviewed: I have personally reviewed following labs and imaging studies  CBC: Recent Labs  Lab 03/03/20 0930 03/04/20 0243 03/05/20 0215 03/06/20 0519 03/07/20 0612  WBC 9.2 8.8 8.8 9.6 10.4  HGB 14.3 14.9 15.4 14.4 14.8  HCT 42.8 43.7 47.7 43.6 43.6  MCV 95.7 95.8 99.4 98.2 96.0  PLT 225 169 237 224 132   Basic Metabolic Panel: Recent Labs  Lab 03/01/20 0558 03/01/20 0558 03/02/20 0445 03/03/20 0930 03/04/20 0243 03/05/20 0215 03/06/20 0519  NA 145   < > 147* 143 147* 148* 142  K 5.0   < > 3.5 3.5 4.7 4.0 4.1  CL 108   < > 103 103 109 111 107  CO2 26   < > 29 23 23 23 24   GLUCOSE 139*   < > 109* 113* 134* 153* 183*  BUN 24*   < > 25* 28* 35* 34* 24*  CREATININE 1.01   < > 1.01 1.02 0.99 0.88 0.93  CALCIUM 8.8*   < > 9.7 9.9 9.9 10.1 9.7  MG 2.8*  --  2.3 2.2 2.6*  --   --   PHOS 3.7  --  2.4* 3.4  --   --   --    < > = values in this interval not displayed.   GFR: Estimated Creatinine Clearance: 89.3 mL/min (by C-G formula based on SCr of 0.93 mg/dL). Liver Function Tests: Recent Labs  Lab 03/06/20 0519  AST 51*  ALT 44  ALKPHOS 36*  BILITOT 1.3*  PROT 6.5  ALBUMIN 2.9*   No results for input(s): LIPASE, AMYLASE in the last 168 hours. Recent Labs  Lab 03/03/20 1146 03/04/20 0915  AMMONIA 46* 31   Coagulation Profile: No results for input(s): INR, PROTIME in the last 168 hours. Cardiac Enzymes: No results for input(s): CKTOTAL, CKMB, CKMBINDEX, TROPONINI in the last 168 hours. BNP (last 3 results) No results for input(s): PROBNP in the last 8760 hours. HbA1C: No results for input(s): HGBA1C in the last 72 hours. CBG: Recent Labs  Lab 03/06/20 1610 03/06/20 2106 03/07/20 0725 03/07/20 1134 03/07/20 1658  GLUCAP 195* 191* 153* 294* 207*   Lipid Profile: No results for input(s): CHOL, HDL, LDLCALC, TRIG, CHOLHDL, LDLDIRECT in the last 72 hours. Thyroid Function Tests: No results for input(s): TSH, T4TOTAL, FREET4, T3FREE,  THYROIDAB in the last 72 hours. Anemia Panel: No results for input(s): VITAMINB12, FOLATE, FERRITIN, TIBC, IRON, RETICCTPCT in the last 72 hours. Sepsis Labs: No results for input(s): PROCALCITON, LATICACIDVEN in the last 168 hours.  Recent Results (from the past 240 hour(s))  SARS Coronavirus 2 by RT PCR (hospital order, performed in [facility] hospital lab) Nasopharyngeal Nasopharyngeal Swab     Status: None   Collection Time: 02/28/20  1:33 AM   Specimen: Nasopharyngeal Swab  Result Value Ref Range Status   SARS Coronavirus 2 NEGATIVE NEGATIVE Final    Comment: (NOTE) SARS-CoV-2 target nucleic acids are NOT DETECTED.  The SARS-CoV-2 RNA  is generally detectable in upper and lower respiratory specimens during the acute phase of infection. The lowest concentration of SARS-CoV-2 viral copies this assay can detect is 250 copies / mL. A negative result does not preclude SARS-CoV-2 infection and should not be used as the sole basis for treatment or other patient management decisions.  A negative result may occur with improper specimen collection / handling, submission of specimen other than nasopharyngeal swab, presence of viral mutation(s) within the areas targeted by this assay, and inadequate number of viral copies (<250 copies / mL). A negative result must be combined with clinical observations, patient history, and epidemiological information.  Fact Sheet for Patients:   StrictlyIdeas.no  Fact Sheet for Healthcare Providers: BankingDealers.co.za  This test is not yet approved or  cleared by the [place] FDA and has been authorized for detection and/or diagnosis of SARS-CoV-2 by FDA under an Emergency Use Authorization (EUA).  This EUA will remain in effect (meaning this test can be used) for the duration of the COVID-19 declaration under Section 564(b)(1) of the Act, 21 U.S.C. section 360bbb-3(b)(1), unless the authorization  is terminated or revoked sooner.  Performed at [facility], [place] [address], [place], [place] [address]   MRSA PCR Screening     Status: None   Collection Time: 02/28/20  4:08 AM   Specimen: Nasopharyngeal  Result Value Ref Range Status   MRSA by PCR NEGATIVE NEGATIVE Final    Comment:        The GeneXpert MRSA Assay (FDA approved for NASAL specimens only), is one component of a comprehensive MRSA colonization surveillance program. It is not intended to diagnose MRSA infection nor to guide or monitor treatment for MRSA infections. Performed at [facility], [place] [address], [place], [place] [address]          Radiology Studies: No results found.      Scheduled Meds: . amLODipine  5 mg Oral Daily  . aspirin  81 mg Oral Daily  . Chlorhexidine Gluconate Cloth  6 each Topical Daily  . clopidogrel  75 mg Oral Daily  . feeding supplement (GLUCERNA SHAKE)  237 mL Oral BID BM  . insulin aspart  0-15 Units Subcutaneous TID WC  . insulin aspart  0-5 Units Subcutaneous QHS  . irbesartan  37.5 mg Oral Daily  . mouth rinse  15 mL Mouth Rinse BID  . metoprolol tartrate  50 mg Oral BID  . polyethylene glycol  17 g Oral Daily  . rosuvastatin  40 mg Oral Daily  . senna-docusate  1 tablet Oral BID  . sodium chloride flush  3 mL Intravenous Q12H   Continuous Infusions: . sodium chloride 10 mL/hr at 02/28/20 0144  . sodium chloride Stopped (03/05/20 1540)     LOS: 8 days       Alma Friendly, MD [facility] P7/21/2021, 6:55 PM

## 2020-03-07 NOTE — PMR Pre-admission (Signed)
PMR Admission Coordinator Pre-Admission Assessment  Patient: Richard Hatfield is an 64 y.o., male MRN: [id number] DOB: 06/01/1956 Height: 5\' 8"  (172.7 cm) Weight: 86.6 kg              Insurance Information HMO:     PPO: yes     PCP:      IPA:      80/20:      OTHER:  PRIMARY: [place] BC[employer] of [place]      Policy#: [id number]      Subscriber: pt CM Name: Richard Hatfield      Phone#: [phone]     Fax#: [phone] Pre-Cert#: [id number] approved until 8.3 when updates are due    Employer: department of transportation Benefits:  Phone #: [phone]     Name: 7/21 Eff. Date: 08/19/2019     Deduct: $1250      Out of Pocket Max: $4890      Life Max: none  CIR: $300 co py per admit then covered at 80%      SNF: 80% 100 days per year Outpatient: $26 to $52 co pay per visit     Co-Pay: visits limited by medical necessity Home Health: 80%      Co-Pay: visits per medical neccesity DME: 80%     Co-Pay: 20% Providers: in network  SECONDARY: none      Policy#:       Phone#:   Development worker, community:       Phone#:   The Engineer, petroleum" for patients in Inpatient Rehabilitation Facilities with attached "Privacy Act [place] Records" was provided and verbally reviewed with: N/A  Emergency Contact Information Contact Information    Name Relation Home Work [place], [place] Spouse   [phone]     Current Medical History  Patient Admitting Diagnosis: Hypoxic/ischemic encephalopathy following cardiac arrest  History of Present Illness:  64 y.o. male with history of HTN, T2DM, ETOH abuse; who was admitted 02/28/20 after cardiac arrest. Per reports, Wife woke up husband having jerking movements and difficulty breathing,activated EMS, performed CPR X 8 minutes followed by 12 minutes of CPR/ACLS for VT by EMS with ROSC. Patient mildly responsive to sternal rubs at admission and intubated in ED. UDS negative. EKG with mild ST changes and he was taken to cath lab and was  found to have diffuse CAD with EF 45-50% and no acute thrombus.  VF arrest felt to be due to transient LAD coronary spasm superimposed on significant multivessel CAD and CVTS consult recommended for input. He continued to have decrease in LOC with sluggish movements and EEG showed severe diffuse encephalopathy felt to be due to sedation or anoxic BI and was negative for seizures.   He was extubated without difficulty on 07/15 and was noted to have confusion with bouts of agitation question ETOH withdrawal v/s delirium.  He continues on IV heparin as well as IV lopressor due to NSVT. He received IV ativan on am of 07/17 with obtundation, hypoxia with bouts of apnea and sluggish pupils therefore code stroke activated and he was transferred to ICU.  Neurology consulted for input and questioned stroke as cause of MS changes. CT  Head was negative for bleed or acute changes. CTA head/neck and cerebral perfusion study done and was negative for infarct or ischemia. MRI done revealing T2 flare felt to be due to anoxic injury.To continue DAPT with Asa and Plavix. As well as betablocker and high intensity statin.  Bedside swallow showed signs of  dysphagia and full liquid diet recommended. He was also noted to be severely dysarthric with rapid rate of speech, verbose, cognitive deficits and weakness. He has undergone improvements over the last few days prior to discharge to CIR.  CVTS, Dr. Orvan Seen consulted. CABG not planned yet. Would prefer CIR admit to assist with neurological recovery prior to consideration for CABG. Plavix would have to be stopped prior to CABG.   Nonsustained VT with occasional PVC's/couplets noted on telemetry. Rates 50's to low 60's. Lopressor meds adjusted. Given presentation as nonsustained VT and unrevascularized multivessel CAD, LifeVest placed prior to discharge to CIR.    Past Medical History  Past Medical History:  Diagnosis Date  . Cardiac arrest ([place]) 02/28/2020  . Diabetes  mellitus without complication ([place])   . Hypertension     Family History  family history is not on file.  Prior Rehab/Hospitalizations:  Has the patient had prior rehab or hospitalizations prior to admission? Yes  Has the patient had major surgery during 100 days prior to admission? Yes  Current Medications   Current Facility-Administered Medications:  .  0.9 %  sodium chloride infusion, , Intravenous, Continuous, Delora Fuel, MD, Last Rate: 10 mL/hr at 02/28/20 0144, New Bag at 02/28/20 0144 .  0.9 %  sodium chloride infusion, 250 mL, Intravenous, PRN, Troy Sine, MD, Stopped at 03/05/20 1540 .  acetaminophen (TYLENOL) 160 MG/5ML solution 650 mg, 650 mg, Per Tube, Q4H PRN, Shellia Cleverly, MD, 650 mg at 03/01/20 0814 .  alum & mag hydroxide-simeth (MAALOX/MYLANTA) 200-200-20 MG/5ML suspension 30 mL, 30 mL, Oral, Q6H PRN, Shalhoub, Sherryll Burger, MD, 30 mL at 03/06/20 2131 .  amLODipine (NORVASC) tablet 5 mg, 5 mg, Oral, Daily, Leonie Man, MD, 5 mg at 03/07/20 1017 .  aspirin chewable tablet 81 mg, 81 mg, Oral, Daily, Candee Furbish, MD, 81 mg at 03/07/20 1017 .  Chlorhexidine Gluconate Cloth 2 % PADS 6 each, 6 each, Topical, Daily, Candee Furbish, MD, 6 each at 03/05/20 1359 .  clopidogrel (PLAVIX) tablet 75 mg, 75 mg, Oral, Daily, Leonie Man, MD, 75 mg at 03/07/20 1016 .  diphenhydrAMINE (BENADRYL) injection 25 mg, 25 mg, Intravenous, QHS PRN, Bowser, Grace E, NP .  feeding supplement (GLUCERNA SHAKE) (GLUCERNA SHAKE) liquid 237 mL, 237 mL, Oral, BID BM, Agarwala, Ravi, MD, 237 mL at 03/07/20 1018 .  hydrALAZINE (APRESOLINE) injection 2-5 mg, 2-5 mg, Intravenous, Q4H PRN, Leonie Man, MD .  insulin aspart (novoLOG) injection 0-15 Units, 0-15 Units, Subcutaneous, TID WC, Agarwala, Ravi, MD, 8 Units at 03/07/20 1140 .  insulin aspart (novoLOG) injection 0-5 Units, 0-5 Units, Subcutaneous, QHS, Agarwala, Ravi, MD .  irbesartan (AVAPRO) tablet 37.5 mg, 37.5 mg, Oral,  Daily, Sarajane Jews, Callie E, PA-C, 37.5 mg at 03/07/20 1017 .  MEDLINE mouth rinse, 15 mL, Mouth Rinse, BID, Candee Furbish, MD, 15 mL at 03/07/20 1018 .  metoprolol tartrate (LOPRESSOR) injection 2.5 mg, 2.5 mg, Intravenous, Q4H PRN, Leonie Man, MD .  metoprolol tartrate (LOPRESSOR) tablet 50 mg, 50 mg, Oral, BID, Einar Grad, RPH, 50 mg at 03/07/20 1016 .  ondansetron (ZOFRAN) injection 4 mg, 4 mg, Intravenous, Q6H PRN, Troy Sine, MD .  polyethylene glycol (MIRALAX / GLYCOLAX) packet 17 g, 17 g, Oral, Daily, Collene Gobble, MD, 17 g at 03/06/20 0907 .  Resource [employer], , Oral, PRN, Brand Males, MD .  rosuvastatin (CRESTOR) tablet 40 mg, 40 mg, Oral, Daily, Leonie Man,  MD, 40 mg at 03/07/20 1016 .  senna-docusate (Senokot-S) tablet 1 tablet, 1 tablet, Oral, BID, Collene Gobble, MD, 1 tablet at 03/06/20 0028 .  sodium chloride flush (NS) 0.9 % injection 3 mL, 3 mL, Intravenous, Q12H, Troy Sine, MD, 3 mL at 03/07/20 1019 .  sodium chloride flush (NS) 0.9 % injection 3 mL, 3 mL, Intravenous, PRN, Troy Sine, MD  Patients Current Diet:  Diet Order            DIET SOFT Room service appropriate? 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  Independent  Indoor Mobility: Did the patient need assistance with walking from room to room (with or without device)?  Independent  Stairs: Did the patient need assistance with internal or external stairs (with or without device)? Independent  Functional Cognition: Did the patient need help planning regular tasks such as shopping or remembering to take medications? Independent  Home Assistive Devices / Equipment Home Assistive Devices/Equipment: None Home Equipment: None  Prior Device Use: Indicate devices/aids used by the patient prior to current illness, exacerbation or injury? None of the above  Current Functional Level Cognition  Arousal/Alertness: Awake/alert Overall Cognitive Status: Impaired/Different from baseline Current Attention Level: Focused Orientation Level: Oriented X4 Following Commands: Follows one step commands inconsistently, Follows one step commands with increased time Safety/Judgement: Decreased awareness of safety, Decreased awareness of deficits General Comments: Pt lethargic with brief periods of arousal. Waxing and waning multiple times throughout session.  Attention: Focused, Sustained Focused Attention: Appears intact Sustained Attention: Impaired Sustained Attention Impairment: Verbal complex Memory: Impaired Memory Impairment: Retrieval deficit, Decreased recall of new information (Immediate: 5/5; delayed: 1/5; with cues: 2/4) Awareness: Impaired Awareness Impairment: Intellectual impairment Problem Solving: Impaired Problem Solving Impairment: Verbal complex Executive Function: Reasoning, Sequencing, Organizing Sequencing: Impaired Sequencing Impairment: Verbal complex (Clock drawing: 2/4) Organizing: Appears intact (Backward digit span: 3/3)    Extremity Assessment (includes Sensation/Coordination)  Upper Extremity Assessment: RUE deficits/detail, LUE deficits/detail RUE Deficits / Details: Due to his waxing and waning lethargy it was hard to tell, but just from obsevation he was moving LUE more than RUE--but both were not [facility] RUE Coordination: decreased fine  motor, decreased gross motor  Lower Extremity Assessment: Generalized weakness    ADLs  Overall ADL's : Needs assistance/impaired General ADL Comments: Total A    Mobility  Overal bed mobility: Needs Assistance Bed Mobility: Supine to Sit, Sit to Supine Supine to sit: +2 for physical assistance, Total assist Sit to supine: +2 for physical assistance, Total assist General bed mobility comments: Assist for all aspects.     Transfers       Ambulation / Gait / Stairs / Office manager / Balance Dynamic Sitting Balance Sitting balance - Comments: Pt sat EOB x 8-10 minutes with max assist. No balance reactions. When allowed to go posteriorly  he would eventually open eyes.  Balance Overall balance assessment: Needs assistance Sitting-balance support: Feet supported, Bilateral upper extremity supported Sitting balance-Leahy Scale: Zero Sitting balance - Comments: Pt sat EOB x 8-10 minutes with max assist. No balance reactions. When allowed to go posteriorly he would eventually open eyes.     Special needs/care consideration Life Vest placement on 7/21 Designated visitor is wife, Richard Hatfield fall 7/22 prior to discharge to CIR; High risk to fall    Previous Home Environment  Living Arrangements: Spouse/significant other  Lives With: Spouse Available Help at Discharge: Family, Available 24 hours/day (wife would have to take FMLA) Type of Home: House Home Layout: Two level, Able to live on main level with bedroom/bathroom Home Access: Stairs to enter [employer] of Steps: 1 Bathroom Shower/Tub: Tub/shower unit, Architectural technologist: Standard Bathroom Accessibility: Yes How Accessible: Accessible via walker [place]: No  Discharge Living Setting Plans for Discharge Living Setting: Patient's home, Lives with (comment) (wife) Type of Home at Discharge: House Discharge Home Layout: Two level, Able to live on main level with  bedroom/bathroom Discharge Home Access: Stairs to enter Entrance Stairs-Rails: None Entrance Stairs-Number of Steps: 1 Discharge Bathroom Shower/Tub: Tub/shower unit, Curtain Discharge Bathroom Toilet: Standard Discharge Bathroom Accessibility: Yes How Accessible: Accessible via walker Does the patient have any problems obtaining your medications?: No  Social/Family/Support Systems Patient Roles: Spouse (employee) Contact Information: wife, Richard Hatfield Anticipated Caregiver: wife Anticipated Ambulance person Information: see above Ability/Limitations of Caregiver: wife is a Pharmacist, hospital and will have to take FMLA Caregiver Availability: 24/7 Discharge Plan Discussed with Primary Caregiver: Yes Is Caregiver In Agreement with Plan?: Yes Does Caregiver/Family have Issues with Lodging/Transportation while Pt is in Rehab?: No  Goals Patient/Family Goal for Rehab: supervision PT, supervision to min OT, supervision SLP Expected length of stay: ELOS 21 to 25 days Pt/Family Agrees to Admission and willing to participate: Yes Program Orientation Provided & Reviewed with Pt/Caregiver Including Roles  & Responsibilities: Yes  Decrease burden of Care through IP rehab admission: n/a  Possible need for SNF placement upon discharge:not anticipated  Patient Condition: This patient's medical and functional status has changed since the consult dated: 03/05/2020 in which the Rehabilitation Physician determined and documented that the patient's condition is appropriate for intensive rehabilitative care in an inpatient rehabilitation facility. See "History of Present Illness" (above) for medical update. Functional changes are: mod assist with mod to max verbal cues due to cognitive deficits. Patient's medical and functional status update has been discussed with the Rehabilitation physician and patient remains appropriate for inpatient rehabilitation. Will admit to inpatient rehab today.  Preadmission Screen  Completed By:  Cleatrice Burke, RN, 03/07/2020 11:54 AM ______________________________________________________________________   Discussed status with Dr. Dagoberto Ligas at  1048 and received approval for admission today.  Admission Coordinator:  Cleatrice Burke, time 9407 Date 03/08/2020

## 2020-03-07 NOTE — Care Management (Signed)
1341 03-07-20 Case Manager received consult for possible LifeVest. Information has been submitted for LifeVest to [place]. Awaiting for insurance authorization. Case Manager will continue to follow for additional transition of care needs. Bethena Roys, RN,BSN Case Manager

## 2020-03-07 NOTE — Plan of Care (Signed)
?  Problem: Clinical Measurements: ?Goal: Will remain free from infection ?Outcome: Progressing ?  ?

## 2020-03-07 NOTE — Progress Notes (Addendum)
Progress Note  Patient Name: Richard Hatfield Date of Encounter: 03/07/2020  [facility] Cardiologist: New to [facility] (Dr. Claiborne Billings)  Subjective   Still confused but slightly improved. He knows he is in [place] and in the hospital. Speech also much clearer today. He denies any chest pain or shortness of breath.  Much clear neurologically today, able to tell me who is the president, he knows the date and month.  Knows his location.  Was even able to identify what a defibrillator would do.  Inpatient Medications    Scheduled Meds: . amLODipine  5 mg Oral Daily  . aspirin  81 mg Oral Daily  . Chlorhexidine Gluconate Cloth  6 each Topical Daily  . clopidogrel  75 mg Oral Daily  . feeding supplement (GLUCERNA SHAKE)  237 mL Oral BID BM  . insulin aspart  0-15 Units Subcutaneous TID WC  . insulin aspart  0-5 Units Subcutaneous QHS  . irbesartan  37.5 mg Oral Daily  . mouth rinse  15 mL Mouth Rinse BID  . metoprolol tartrate  50 mg Oral BID  . polyethylene glycol  17 g Oral Daily  . rosuvastatin  40 mg Oral Daily  . senna-docusate  1 tablet Oral BID  . sodium chloride flush  3 mL Intravenous Q12H   Continuous Infusions: . sodium chloride 10 mL/hr at 02/28/20 0144  . sodium chloride Stopped (03/05/20 1540)   PRN Meds: sodium chloride, acetaminophen (TYLENOL) oral liquid 160 mg/5 mL, alum & mag hydroxide-simeth, diphenhydrAMINE, hydrALAZINE, metoprolol tartrate, ondansetron (ZOFRAN) IV, Resource ThickenUp Clear, sodium chloride flush   Vital Signs    Vitals:   03/07/20 0017 03/07/20 0420 03/07/20 0806 03/07/20 1016  BP: (!) 149/84 129/69 132/83   Pulse: (!) 53 (!) 57 65 (!) 59  Resp: (!) 21 18 20    Temp: 99.4 F (37.4 C) 99.2 F (37.3 C) 98.1 F (36.7 C)   TempSrc: Oral Oral Oral   SpO2: 96% 95% 95%   Weight:  86.6 kg    Height:        Intake/Output Summary (Last 24 hours) at 03/07/2020 1156 Last data filed at 03/07/2020 0807 Gross per 24 hour  Intake 610 ml  Output  3400 ml  Net -2790 ml   Last 3 Weights 03/07/2020 03/06/2020 03/05/2020  Weight (lbs) 190 lb 14.7 oz 190 lb 11.2 oz 185 lb 3 oz  Weight (kg) 86.6 kg 86.5 kg 84 kg      Telemetry    Sinus rhythm with occasional PVCs/couplets. - Personally Reviewed  ECG    No new EKG tracing today. - Personally Reviewed  Physical Exam   GEN: No acute distress.   Neck: No JVD Cardiac: RRR. III/VI systolic murmur best heard at upper sternal border. No rubs or gallops. Respiratory: Clear to auscultation bilaterally. GI: Soft, non-tender, non-distended  MS: No lower extremity edema. No deformity. Neuro:  No focal deficits. Still confused but slightly improved.  Psych: Normal affect   Labs    High Sensitivity Troponin:   Recent Labs  Lab 02/28/20 0126 02/28/20 0543  TROPONINIHS 60* 992*      Chemistry Recent Labs  Lab 03/04/20 0243 03/05/20 0215 03/06/20 0519  NA 147* 148* 142  K 4.7 4.0 4.1  CL 109 111 107  CO2 23 23 24   GLUCOSE 134* 153* 183*  BUN 35* 34* 24*  CREATININE 0.99 0.88 0.93  CALCIUM 9.9 10.1 9.7  PROT  --   --  6.5  ALBUMIN  --   --  2.9*  AST  --   --  51*  ALT  --   --  44  ALKPHOS  --   --  36*  BILITOT  --   --  1.3*  GFRNONAA >60 >60 >60  GFRAA >60 >60 >60  ANIONGAP 15 14 11      Hematology Recent Labs  Lab 03/05/20 0215 03/06/20 0519 03/07/20 0612  WBC 8.8 9.6 10.4  RBC 4.80 4.44 4.54  HGB 15.4 14.4 14.8  HCT 47.7 43.6 43.6  MCV 99.4 98.2 96.0  MCH 32.1 32.4 32.6  MCHC 32.3 33.0 33.9  RDW 11.9 11.5 11.3*  PLT 237 224 237    BNPNo results for input(s): BNP, PROBNP in the last 168 hours.   DDimer No results for input(s): DDIMER in the last 168 hours.   Radiology    No results found.  Cardiac Studies   Left Heart Catheterization 02/28/2020:  Prox LAD to Mid LAD lesion is 60% stenosed.  1st Diag lesion is 75% stenosed.  Mid LAD lesion is 80% stenosed.  Prox Cx lesion is 70% stenosed. Prox Cx to Mid Cx lesion is 80% stenosed.  Prox  RCA-1 lesion is 20% stenosed. Prox RCA-2 lesion is 70% stenosed.  RV Branch lesion is 90% stenosed.  Dist RCA-1 lesion is 80% stenosed. Dist RCA-2 lesion is 85% stenosed.  RPDA lesion is 30% stenosed.  The left ventricular ejection fraction is 45-50% by visual estimate.  LV end diastolic pressure is normal.  There is mild left ventricular systolic dysfunction.  Out of hospital witnessed VF cardiac arrest with return of ROSC after approximately 20 minutes of CPR and administration of 2 doses of epinephrine.  Significant three-vessel CAD with 60% diffuse proximal LAD stenosis, long diffuse 70% diagonal stenosis and 80% LAD stenosis after the first diagonal vessel; 70 to 80% proximal diffuse circumflex stenosis before a large marginal branch; and very large dominant RCA with 70% proximal stenosis and long diffuse 80 and 85% stenoses beyond the acute margin proximal to the PDA takeoff with mild 30% narrowing in the PDA.  Mild acute LV dysfunction with focal anterolateral hypocontractility and EF estimated 45 to 50%. LVEDP 12 mm  Recommendation: Suspect transient coronary vasospasm involving the LAD circulation in the etiology of the patient's VF cardiac arrest. Low-dose IV nitroglycerin was started at the end of the catheterization procedure. Patient will be transported to to heart and evaluated by critical care with consideration for hypothermia due to witnessed cardiac arrest. A 2D echo Doppler study will be obtained. We will review angios with colleagues but with diffuse multivessel CAD in this diabetic male consider possible surgical consultation for CABG revascularization following stability. _______________  Echocardiogram 02/28/2020: Impressions: 1. Normal wall motion in visualized segments. Mid to distal anterolateral wall not well visualized on apical images. Consider repeat with echo contrast for wall motion when patient less agitated.. Left ventricular ejection fraction, by  estimation, is 50 to 55%. The left ventricle has low normal function. The left ventricle has no regional wall motion abnormalities. There is mild concentric left ventricular hypertrophy. Left ventricular diastolic parameters were normal.  2. Right ventricular systolic function is normal. The right ventricular size is normal. There is mildly elevated pulmonary artery systolic pressure.  3. Left atrial size was mildly dilated.  4. The mitral valve is normal in structure. Trivial mitral valve regurgitation. No evidence of mitral stenosis.  5. The aortic valve has an indeterminant number of cusps. Aortic valve regurgitation is not visualized. Mild aortic  valve stenosis.   Patient Profile     64 y.o. male with a history of hypertension and diabetes who presented with out of hospital cardiac arrest on 02/28/2020.  Assessment & Plan    Cardiac Arrest with Severe 3-Vessel CAD - Patient presented with out of hospital witnessed VF cardiac arrest with return of ROSC after about 20 minutes of CPR and 2 doses of Epi.  - High-sensitivity troponin 60 >> 992.  - Cath on 7/13 showed severe 3-vessel CAD involving LAD, RCA, and LCX.  - Echo showed LVEF of 50-55% with normal wall motion in visualized segments (but mid to distal anterolateral wall not well visualized).  - Continue DAPT with Aspirin and Plavix. Continue beta-blocker and high-intensity statin. - He has been very encephalopathic and plan is for CT Surgery consultation for possible CABG once encephalopathy has improved. Patient still confused at this time but some improvement. Not felt to be favorable candidate for CT surgery consultation or high risk PCI at this time. Wife also agrees that he is not in any state of mind to go forward with a big surgery right now. Will continue to treat medically for now. Will arrange follow-up visit in 1 month after patient has be discharged from [facility] and can discuss possible CT surgery consult at that time.  Based  on his level of neurologic improvement just now.  I will discuss with CVTS timing of when we should do the consult.  I think having time at CIR to recuperate from his cardiac arrest would be reasonable, this would allow for more neurologic recovery.  CAR is definitely the best location for him from a safety standpoint since it is still in this facility.  Will discuss with CVTS consult team timing of when we would actually have him seen.  Could potentially be seen prior to transfer to CIR. ->  This would potentially allow decision to be made about if/when CABG would be done. -->  Clopidogrel would need to be stopped 5-7 days prior to CABG.   Non-Sustained VT - Occasional PVCs noted on telemetry.  - Continue Lopressor 50mg  twice daily.  Discussed with colleagues, with his presentation as nonsustained VT, and unrevascularized multivessel CAD, we have put in request for LifeVest.  I have talked with the inpatient rehab, this would need to be placed prior to him going.  Unfortunately I was not aware that he had a room available today.  It could potentially delay [place] 1 day, but based on his presentation is a valid option.  Hypertension - BP improved after restarting ARB. - Continue Irbesartan 37.5mg  (since we do not have Valsartan on formulary). - Continue Lopressor as above.  Encephalopathy - Felt to be hypoxic/metabolic in nature. - Canagement per primary team and Neuro.  - Plan is for CIR once bed is available.  Aspiration Pneumonia - Completed 5 days of Ceftriaxone. - Afebrile and WBC normal. - Management per primary team.  For questions or updates, please contact [place] Please consult [email or web address] for contact info under        Signed, Darreld Mclean, PA-C  03/07/2020, 11:56 AM     ATTENDING ATTESTATION  I have seen, examined and evaluated the patient this AM along with Sande Rives, PA.  After reviewing all the available data and chart, we discussed the patients  laboratory, study & physical findings as well as symptoms in detail. I agree with her findings, examination as well as impression recommendations as per our discussion.  Attending adjustments noted in italics.   Jakorian seems much more lucid today.  Significant change, compared to yesterday.  He clearly has not had any more chest pain.  He still has some confusion with short-term memory loss and confusing some words.  At this point I do think that he may very well be a good candidate to move forward with CABG sooner than anticipated.  He is per currently scheduled to go to CIR which is a reasonable safe location for him to rehab some from his initial event.  With presentation of cardiac arrest/VT VF and MI/CAD that is not revascularized, I am working on getting him set up for LifeVest that he would wear while at CIR.  I plan to discuss him with CVTS consult team in order to determine the most appropriate timing for consult.  We may have him seen prior to going to CIR.  This would potentially preclude need for waiting to be seen in the outpatient setting.    Otherwise, he has a great regimen.  Blood pressure stable no angina. He is on aspirin Plavix -> Plavix will need to be stopped 5 to 7 days pre-CABG which is why I would like to have a potential concept of timing for CABG prior to him leaving.  We will write short follow-up addendum once conversation with CVTS has been completed.    Glenetta Hew, M.D., M.S. Interventional Cardiologist   Pager # [phone] Phone # [phone] [address] [place], [place] [address]

## 2020-03-07 NOTE — Progress Notes (Signed)
Inpatient Rehabilitation Admissions Coordinator  I have received insurance approval for CIR. Dr. Delice Lesch contacted Dr. Ellyn Hack to discuss rehab goals and expectations for intensity of therapy. Dr. Ellyn Hack discussed possible need for Life Vest. Life Vest would have to be on patient prior to admit to CIR. I discussed with pt's wife at bedside, and  RN CM , Hassan Rowan. W await admit to CIR until Life vest is arranged. I will follow up tomorrow.  Danne Baxter, RN, MSN Rehab Admissions Coordinator [phone] 03/07/2020 12:43 PM

## 2020-03-07 NOTE — Progress Notes (Signed)
Pt with unwitnessed fall. No obvious injury to head. RN concerned as pt is confused and has hx of anoxic brain injury and cannot accurately report if he did or did not hit head.  CT of head ordered

## 2020-03-08 ENCOUNTER — Other Ambulatory Visit (HOSPITAL_COMMUNITY): Payer: BC Managed Care – PPO

## 2020-03-08 ENCOUNTER — Encounter (HOSPITAL_COMMUNITY): Payer: Self-pay | Admitting: Physical Medicine & Rehabilitation

## 2020-03-08 ENCOUNTER — Other Ambulatory Visit: Payer: Self-pay

## 2020-03-08 ENCOUNTER — Encounter (HOSPITAL_COMMUNITY): Payer: BC Managed Care – PPO

## 2020-03-08 ENCOUNTER — Inpatient Hospital Stay (HOSPITAL_COMMUNITY): Payer: BC Managed Care – PPO

## 2020-03-08 ENCOUNTER — Inpatient Hospital Stay (HOSPITAL_COMMUNITY)
Admission: RE | Admit: 2020-03-08 | Discharge: 2020-03-20 | DRG: 092 | Disposition: A | Discharge status: Home / Self Care | Payer: BC Managed Care – PPO | Source: Intra-hospital | Attending: Physical Medicine & Rehabilitation | Admitting: Physical Medicine & Rehabilitation

## 2020-03-08 DIAGNOSIS — Z794 Long term (current) use of insulin: Secondary | ICD-10-CM | POA: Diagnosis not present

## 2020-03-08 DIAGNOSIS — I472 Ventricular tachycardia, unspecified: Secondary | ICD-10-CM

## 2020-03-08 DIAGNOSIS — E669 Obesity, unspecified: Secondary | ICD-10-CM | POA: Diagnosis present

## 2020-03-08 DIAGNOSIS — E119 Type 2 diabetes mellitus without complications: Secondary | ICD-10-CM

## 2020-03-08 DIAGNOSIS — R4189 Other symptoms and signs involving cognitive functions and awareness: Secondary | ICD-10-CM | POA: Diagnosis present

## 2020-03-08 DIAGNOSIS — R0989 Other specified symptoms and signs involving the circulatory and respiratory systems: Secondary | ICD-10-CM | POA: Diagnosis present

## 2020-03-08 DIAGNOSIS — I25110 Atherosclerotic heart disease of native coronary artery with unstable angina pectoris: Secondary | ICD-10-CM

## 2020-03-08 DIAGNOSIS — I2510 Atherosclerotic heart disease of native coronary artery without angina pectoris: Secondary | ICD-10-CM | POA: Diagnosis present

## 2020-03-08 DIAGNOSIS — I10 Essential (primary) hypertension: Secondary | ICD-10-CM | POA: Diagnosis present

## 2020-03-08 DIAGNOSIS — R945 Abnormal results of liver function studies: Secondary | ICD-10-CM | POA: Diagnosis present

## 2020-03-08 DIAGNOSIS — E118 Type 2 diabetes mellitus with unspecified complications: Secondary | ICD-10-CM | POA: Diagnosis present

## 2020-03-08 DIAGNOSIS — D72829 Elevated white blood cell count, unspecified: Secondary | ICD-10-CM | POA: Diagnosis present

## 2020-03-08 DIAGNOSIS — Z8674 Personal history of sudden cardiac arrest: Secondary | ICD-10-CM

## 2020-03-08 DIAGNOSIS — G931 Anoxic brain damage, not elsewhere classified: Secondary | ICD-10-CM | POA: Diagnosis present

## 2020-03-08 DIAGNOSIS — I493 Ventricular premature depolarization: Secondary | ICD-10-CM | POA: Diagnosis present

## 2020-03-08 DIAGNOSIS — R7401 Elevation of levels of liver transaminase levels: Secondary | ICD-10-CM

## 2020-03-08 DIAGNOSIS — I249 Acute ischemic heart disease, unspecified: Secondary | ICD-10-CM

## 2020-03-08 DIAGNOSIS — N179 Acute kidney failure, unspecified: Secondary | ICD-10-CM | POA: Diagnosis present

## 2020-03-08 DIAGNOSIS — F1010 Alcohol abuse, uncomplicated: Secondary | ICD-10-CM | POA: Diagnosis present

## 2020-03-08 DIAGNOSIS — Z6829 Body mass index (BMI) 29.0-29.9, adult: Secondary | ICD-10-CM | POA: Diagnosis not present

## 2020-03-08 DIAGNOSIS — K219 Gastro-esophageal reflux disease without esophagitis: Secondary | ICD-10-CM | POA: Diagnosis present

## 2020-03-08 DIAGNOSIS — E1165 Type 2 diabetes mellitus with hyperglycemia: Secondary | ICD-10-CM | POA: Diagnosis present

## 2020-03-08 DIAGNOSIS — Z95810 Presence of automatic (implantable) cardiac defibrillator: Secondary | ICD-10-CM

## 2020-03-08 DIAGNOSIS — I213 ST elevation (STEMI) myocardial infarction of unspecified site: Secondary | ICD-10-CM

## 2020-03-08 LAB — GLUCOSE, CAPILLARY
Glucose-Capillary: 191 mg/dL — ABNORMAL HIGH (ref 70–99)
Glucose-Capillary: 178 mg/dL — ABNORMAL HIGH (ref 70–99)
Glucose-Capillary: 234 mg/dL — ABNORMAL HIGH (ref 70–99)
Glucose-Capillary: 272 mg/dL — ABNORMAL HIGH (ref 70–99)
Glucose-Capillary: 184 mg/dL — ABNORMAL HIGH (ref 70–99)

## 2020-03-08 LAB — COMPREHENSIVE METABOLIC PANEL
ALT: 61 U/L — ABNORMAL HIGH (ref 0–44)
AST: 57 U/L — ABNORMAL HIGH (ref 15–41)
Albumin: 3.1 g/dL — ABNORMAL LOW (ref 3.5–5.0)
Alkaline Phosphatase: 47 U/L (ref 38–126)
Anion gap: 9 (ref 5–15)
BUN: 23 mg/dL (ref 8–23)
CO2: 23 mmol/L (ref 22–32)
Calcium: 8.9 mg/dL (ref 8.9–10.3)
Chloride: 104 mmol/L (ref 98–111)
Creatinine, Ser: 0.85 mg/dL (ref 0.61–1.24)
GFR calc Af Amer: >60 mL/min (ref >60)
GFR calc non Af Amer: >60 mL/min (ref >60)
Glucose, Bld: 290 mg/dL — ABNORMAL HIGH (ref 70–99)
Potassium: 4.4 mmol/L (ref 3.5–5.1)
Sodium: 136 mmol/L (ref 135–145)
Total Bilirubin: 0.5 mg/dL (ref 0.3–1.2)
Total Protein: 6.7 g/dL (ref 6.5–8.1)

## 2020-03-08 MED ORDER — IRBESARTAN 75 MG PO TABS: 37.5000 mg | ORAL_TABLET | Freq: Every day | ORAL | Status: DC

## 2020-03-08 MED ORDER — SENNOSIDES-DOCUSATE SODIUM 8.6-50 MG PO TABS: 1.0000 | ORAL_TABLET | Freq: Every day | ORAL | Status: DC

## 2020-03-08 MED ORDER — POLYETHYLENE GLYCOL 3350 17 G PO PACK
17.0000 g | PACK | Freq: Two times a day (BID) | ORAL | Status: DC
  Administered 2020-03-08 – 2020-03-20 (×23): 17 g via ORAL
  Filled 2020-03-08 (×24): qty 1

## 2020-03-08 MED ORDER — AMLODIPINE BESYLATE 5 MG PO TABS
5.0000 mg | ORAL_TABLET | Freq: Every day | ORAL | Status: DC
  Administered 2020-03-09 – 2020-03-20 (×12): 5 mg via ORAL
  Filled 2020-03-08 (×12): qty 1

## 2020-03-08 MED ORDER — DIPHENHYDRAMINE HCL 50 MG/ML IJ SOLN: 25.0000 mg | Freq: Every evening | INTRAMUSCULAR | 0 refills | Status: DC | PRN

## 2020-03-08 MED ORDER — PROSOURCE PLUS PO LIQD
30.0000 mL | Freq: Two times a day (BID) | ORAL | Status: DC
  Administered 2020-03-08 – 2020-03-20 (×23): 30 mL via ORAL
  Filled 2020-03-08 (×18): qty 30

## 2020-03-08 MED ORDER — PROCHLORPERAZINE EDISYLATE 10 MG/2ML IJ SOLN: 5.0000 mg | Freq: Four times a day (QID) | INTRAMUSCULAR | Status: DC | PRN

## 2020-03-08 MED ORDER — ASPIRIN 81 MG PO CHEW
81.0000 mg | CHEWABLE_TABLET | Freq: Every day | ORAL | Status: DC
  Administered 2020-03-09 – 2020-03-20 (×12): 81 mg via ORAL
  Filled 2020-03-08 (×12): qty 1

## 2020-03-08 MED ORDER — ADULT MULTIVITAMIN W/MINERALS CH
1.0000 | ORAL_TABLET | Freq: Every day | ORAL | Status: DC
  Administered 2020-03-08 – 2020-03-20 (×13): 1 via ORAL
  Filled 2020-03-08 (×14): qty 1

## 2020-03-08 MED ORDER — EMPAGLIFLOZIN 25 MG PO TABS
25.0000 mg | ORAL_TABLET | Freq: Every day | ORAL | Status: DC
  Administered 2020-03-09 – 2020-03-20 (×12): 25 mg via ORAL
  Filled 2020-03-08 (×13): qty 1

## 2020-03-08 MED ORDER — ORAL CARE MOUTH RINSE
15.0000 mL | Freq: Two times a day (BID) | OROMUCOSAL | Status: DC
  Administered 2020-03-08 – 2020-03-20 (×19): 15 mL via OROMUCOSAL

## 2020-03-08 MED ORDER — ALUM & MAG HYDROXIDE-SIMETH 200-200-20 MG/5ML PO SUSP
30.0000 mL | ORAL | Status: DC | PRN
  Administered 2020-03-08: 30 mL via ORAL
  Filled 2020-03-08: qty 30

## 2020-03-08 MED ORDER — PROCHLORPERAZINE MALEATE 5 MG PO TABS: 5.0000 mg | ORAL_TABLET | Freq: Four times a day (QID) | ORAL | Status: DC | PRN

## 2020-03-08 MED ORDER — INSULIN ASPART 100 UNIT/ML ~~LOC~~ SOLN
0.0000 [IU] | Freq: Three times a day (TID) | SUBCUTANEOUS | Status: DC
  Administered 2020-03-08: 8 [IU] via SUBCUTANEOUS
  Administered 2020-03-09: 5 [IU] via SUBCUTANEOUS
  Administered 2020-03-09: 3 [IU] via SUBCUTANEOUS
  Administered 2020-03-09: 5 [IU] via SUBCUTANEOUS
  Administered 2020-03-10 (×2): 3 [IU] via SUBCUTANEOUS
  Administered 2020-03-10: 8 [IU] via SUBCUTANEOUS
  Administered 2020-03-11: 3 [IU] via SUBCUTANEOUS
  Administered 2020-03-11 (×2): 8 [IU] via SUBCUTANEOUS
  Administered 2020-03-12: 5 [IU] via SUBCUTANEOUS
  Administered 2020-03-12 (×2): 3 [IU] via SUBCUTANEOUS
  Administered 2020-03-13 – 2020-03-15 (×5): 2 [IU] via SUBCUTANEOUS
  Administered 2020-03-16: 5 [IU] via SUBCUTANEOUS
  Administered 2020-03-16: 2 [IU] via SUBCUTANEOUS
  Administered 2020-03-16: 3 [IU] via SUBCUTANEOUS
  Administered 2020-03-17: 2 [IU] via SUBCUTANEOUS
  Administered 2020-03-17: 5 [IU] via SUBCUTANEOUS
  Administered 2020-03-17 – 2020-03-18 (×2): 2 [IU] via SUBCUTANEOUS
  Administered 2020-03-18: 8 [IU] via SUBCUTANEOUS
  Administered 2020-03-18: 3 [IU] via SUBCUTANEOUS
  Administered 2020-03-19: 2 [IU] via SUBCUTANEOUS
  Administered 2020-03-19 (×2): 3 [IU] via SUBCUTANEOUS
  Administered 2020-03-20: 5 [IU] via SUBCUTANEOUS
  Administered 2020-03-20: 2 [IU] via SUBCUTANEOUS

## 2020-03-08 MED ORDER — TRAZODONE HCL 50 MG PO TABS
25.0000 mg | ORAL_TABLET | Freq: Every evening | ORAL | Status: DC | PRN
  Administered 2020-03-08 – 2020-03-18 (×7): 50 mg via ORAL
  Filled 2020-03-08 (×7): qty 1

## 2020-03-08 MED ORDER — POLYETHYLENE GLYCOL 3350 17 G PO PACK: 17.0000 g | PACK | Freq: Every day | ORAL | Status: DC | PRN

## 2020-03-08 MED ORDER — ROSUVASTATIN CALCIUM 20 MG PO TABS
40.0000 mg | ORAL_TABLET | Freq: Every day | ORAL | Status: DC
  Administered 2020-03-09 – 2020-03-20 (×12): 40 mg via ORAL
  Filled 2020-03-08 (×12): qty 2
  Filled 2020-03-08: qty 8

## 2020-03-08 MED ORDER — ACETAMINOPHEN 325 MG PO TABS: 325.0000 mg | ORAL_TABLET | ORAL | Status: DC | PRN

## 2020-03-08 MED ORDER — METOPROLOL TARTRATE 25 MG PO TABS: 25.0000 mg | ORAL_TABLET | Freq: Two times a day (BID) | ORAL | Status: DC

## 2020-03-08 MED ORDER — DIPHENHYDRAMINE HCL 12.5 MG/5ML PO ELIX: 12.5000 mg | ORAL_SOLUTION | Freq: Four times a day (QID) | ORAL | Status: DC | PRN

## 2020-03-08 MED ORDER — IRBESARTAN 75 MG PO TABS
37.5000 mg | ORAL_TABLET | Freq: Every day | ORAL | Status: DC
  Administered 2020-03-09 – 2020-03-20 (×12): 37.5 mg via ORAL
  Filled 2020-03-08 (×12): qty 1

## 2020-03-08 MED ORDER — CLOPIDOGREL BISULFATE 75 MG PO TABS: 75.0000 mg | ORAL_TABLET | Freq: Every day | ORAL | Status: DC

## 2020-03-08 MED ORDER — INSULIN ASPART 100 UNIT/ML ~~LOC~~ SOLN
0.0000 [IU] | Freq: Every day | SUBCUTANEOUS | Status: DC
  Administered 2020-03-09: 2 [IU] via SUBCUTANEOUS
  Administered 2020-03-11: 3 [IU] via SUBCUTANEOUS
  Administered 2020-03-13 – 2020-03-17 (×2): 2 [IU] via SUBCUTANEOUS
  Administered 2020-03-18: 5 [IU] via SUBCUTANEOUS

## 2020-03-08 MED ORDER — CLOPIDOGREL BISULFATE 75 MG PO TABS
75.0000 mg | ORAL_TABLET | Freq: Every day | ORAL | Status: DC
  Administered 2020-03-09 – 2020-03-20 (×12): 75 mg via ORAL
  Filled 2020-03-08 (×12): qty 1

## 2020-03-08 MED ORDER — ASPIRIN 81 MG PO CHEW: 81.0000 mg | CHEWABLE_TABLET | Freq: Every day | ORAL | Status: AC

## 2020-03-08 MED ORDER — ENSURE MAX PROTEIN PO LIQD
11.0000 [oz_av] | Freq: Two times a day (BID) | ORAL | Status: DC
  Administered 2020-03-08 – 2020-03-20 (×23): 11 [oz_av] via ORAL
  Filled 2020-03-08 (×25): qty 330

## 2020-03-08 MED ORDER — BISACODYL 10 MG RE SUPP: 10.0000 mg | Freq: Every day | RECTAL | Status: DC | PRN

## 2020-03-08 MED ORDER — AMLODIPINE BESYLATE 5 MG PO TABS: 5.0000 mg | ORAL_TABLET | Freq: Every day | ORAL | Status: DC

## 2020-03-08 MED ORDER — METOPROLOL TARTRATE 25 MG PO TABS
25.0000 mg | ORAL_TABLET | Freq: Two times a day (BID) | ORAL | Status: DC
  Administered 2020-03-08 – 2020-03-20 (×25): 25 mg via ORAL
  Filled 2020-03-08 (×27): qty 1

## 2020-03-08 MED ORDER — FLEET ENEMA 7-19 GM/118ML RE ENEM: 1.0000 | ENEMA | Freq: Once | RECTAL | Status: DC | PRN

## 2020-03-08 MED ORDER — GLUCERNA SHAKE PO LIQD: 237.0000 mL | Freq: Two times a day (BID) | ORAL | 0 refills | Status: DC

## 2020-03-08 MED ORDER — PROCHLORPERAZINE 25 MG RE SUPP: 12.5000 mg | Freq: Four times a day (QID) | RECTAL | Status: DC | PRN

## 2020-03-08 MED ORDER — ROSUVASTATIN CALCIUM 40 MG PO TABS: 40.0000 mg | ORAL_TABLET | Freq: Every day | ORAL | Status: DC

## 2020-03-08 MED ORDER — CALCIUM CARBONATE ANTACID 500 MG PO CHEW
1.0000 | CHEWABLE_TABLET | Freq: Three times a day (TID) | ORAL | Status: DC
  Administered 2020-03-08 – 2020-03-20 (×36): 200 mg via ORAL
  Filled 2020-03-08 (×34): qty 1

## 2020-03-08 MED ORDER — GUAIFENESIN-DM 100-10 MG/5ML PO SYRP: 5.0000 mL | ORAL_SOLUTION | Freq: Four times a day (QID) | ORAL | Status: DC | PRN

## 2020-03-08 MED ORDER — PANTOPRAZOLE SODIUM 20 MG PO TBEC
20.0000 mg | DELAYED_RELEASE_TABLET | Freq: Every day | ORAL | Status: DC
  Administered 2020-03-08 – 2020-03-20 (×13): 20 mg via ORAL
  Filled 2020-03-08 (×13): qty 1

## 2020-03-08 MED ORDER — POLYETHYLENE GLYCOL 3350 17 G PO PACK: 17.0000 g | PACK | Freq: Every day | ORAL | 0 refills | Status: DC

## 2020-03-08 MED ORDER — ENOXAPARIN SODIUM 40 MG/0.4ML ~~LOC~~ SOLN
40.0000 mg | SUBCUTANEOUS | Status: DC
  Administered 2020-03-08 – 2020-03-14 (×7): 40 mg via SUBCUTANEOUS
  Filled 2020-03-08 (×7): qty 0.4

## 2020-03-08 NOTE — Progress Notes (Signed)
Inpatient Rehabilitation Medication Review by a Pharmacist  A complete drug regimen review was completed for this patient to identify any potential clinically significant medication issues.  Clinically significant medication issues were identified:  no   Type of Medication Issue Identified Description of Issue Urgent (address now) Non-Urgent (address on AM team rounds) Plan Plan Accepted by Provider? (Yes / No / Pending AM Rounds)  Drug Interaction(s) (clinically significant)       Duplicate Therapy       Allergy       No Medication Administration End Date       Incorrect Dose       Additional Drug Therapy Needed       Other  Metformin and scheduled aspart insulin not continued. Patient placed on SSI.  Non-urgent Provider to monitor dietary intake and CBGs to evaluate for continuation of these meds.  N/A    Name of provider notified for urgent issues identified: n/a  Provider Method of Notification: n/a   For non-urgent medication issues to be resolved on team rounds tomorrow morning a [facility] [place] was sent to:    Pharmacist comments:   Time spent performing this drug regimen review (minutes):  10  Richard Hatfield A. Levada Dy, PharmD, BCPS, FNKF Clinical Pharmacist [place] Please utilize Amion for appropriate phone number to reach the unit pharmacist ([place])  03/08/2020 2:33 PM

## 2020-03-08 NOTE — Plan of Care (Signed)
  Problem: RH SAFETY Goal: RH STG ADHERE TO SAFETY PRECAUTIONS W/ASSISTANCE/DEVICE Description: STG Adhere to Safety Precautions With min assist  Outcome: Progressing Goal: RH STG DECREASED RISK OF FALL WITH ASSISTANCE Description: STG Decreased Risk of Fall With Assistance. Outcome: Progressing   Problem: RH COGNITION-NURSING Goal: RH STG USES MEMORY AIDS/STRATEGIES W/ASSIST TO PROBLEM SOLVE Description: STG Uses Memory Aids/Strategies With Assistance to Problem Solve. Outcome: Progressing Goal: RH STG ANTICIPATES NEEDS/CALLS FOR ASSIST W/ASSIST/CUES Description: STG Anticipates Needs/Calls for Assist With Assistance/Cues. Outcome: Progressing

## 2020-03-08 NOTE — H&P (Signed)
Physical Medicine and Rehabilitation Admission H&P    No chief complaint on file.   HPI: Richard Hatfield is a 64 year old male with history of HTN, T2DM, ETOH abuse who was admitted on 02/28/20 after cardiac arrest. Per reports, wife work up to husband who had jerking movements and difficulty breathing, activated EMS, performed CPR X 8 minutes followed by 12 minutes of CPR/ACLS protocol by EM for VT with ROSC. He was mildly responsive to sternal rubs and was intubated in ED. UDS negative except for benzo's. EKG showed mild ST changes and he underwent cardiac cath revealing significant diffuse CAD with with EF 45-50% and no acute thrombus. Cardiology felt that VT arrest due to transient LAD coronary spasm superimposed on multivessel CAD. He was extubated without difficulty on 07/15 bu has had issues with confusion and bout of agitation question due to ETOH withdrawal v/s delirium.   He was maintained on IV heparin and IV lopressor due to NSVT. He did receive dose of IV ativan on 07/17 with resultant hypoxia with bouts of apnea, obtundation and sluggish pupils.  Code stroke activated and neurology questioned stroke as cause of MS changes. CT head negative for bleed or acute changes. CTA head/perfusion study negative for LVO or ischemia. MRI brain showed T2 flare felt to be due to anoxic injury.   He was note felt to be a good candidate for PCI or CT surgery due to neurological deficits and medical treatment recommended. Statin held due to abnormal LFTs and amlodipine added due to concerns of spasms.  Mentation improving and Dr. Orvan Seen evaluated patient yesterday and recommends continuing to monitor for neurological recovery prior to considering CABG. He continues to have occasional PVCs/couplets--BB decreased and BP improving with addition of ARB. LifeVest ordered yesterday and wife has been educated on use per reports. Patient continues to have deficits due to anoxic brain injury with cognitive deficits,  balance deficits, decreased safety awareness and continued bouts of confusion with agitation (earlier this am per nurse). Therapy ongoing and CIR recommended due to functional decline.    Pt reports doesn't like Maalox, but wants something for indigestion- takes prilosec at home OTC; has condom cath- forget it a lot and that's why he got up o/n and fell- voiding OK.  Slept well.  Was having diarrhea 2-3x/day- but none today.  Got life vest today- is kind of tight.   Review of Systems  Constitutional: Negative for chills and fever.  HENT: Negative for hearing loss.   Respiratory: Negative for cough and stridor.   Cardiovascular: Negative for chest pain, palpitations and leg swelling.  Gastrointestinal: Positive for heartburn. Negative for abdominal pain, constipation (had BM today per nurse) and nausea.  Musculoskeletal: Positive for back pain (tired of being in bed).  Skin: Negative for rash.  Neurological: Positive for weakness.  Psychiatric/Behavioral: Positive for memory loss.  All other systems reviewed and are negative.    Past Medical History:  Diagnosis Date  . Cardiac arrest ([place]) 02/28/2020  . Diabetes mellitus without complication ([place])   . Hypertension     Past Surgical History:  Procedure Laterality Date  . LEFT HEART CATH AND CORONARY ANGIOGRAPHY N/A 02/28/2020   Procedure: LEFT HEART CATH AND CORONARY ANGIOGRAPHY;  Surgeon: Troy Sine, MD;  Location: [facility];  Service: Cardiovascular;  Laterality: N/A;    Family History: Question accuracy--thinks parents/sister have "heart issues" just like him.    Social History:  Married. 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 He reports that he has never smoked. He has never used smokeless tobacco. He reports previous alcohol use--quit in Jan?. He reports that he does not use drugs.    Allergies: No Known Allergies    Medications Prior to Admission  Medication Sig Dispense Refill  . [START ON 03/09/2020]  amLODipine (NORVASC) 5 MG tablet Take 1 tablet (5 mg total) by mouth daily.    Derrill Memo ON 03/09/2020] aspirin 81 MG chewable tablet Chew 1 tablet (81 mg total) by mouth daily.    Derrill Memo ON 03/09/2020] clopidogrel (PLAVIX) 75 MG tablet Take 1 tablet (75 mg total) by mouth daily.    . diphenhydrAMINE (BENADRYL) 50 MG/ML injection Inject 0.5 mLs (25 mg total) into the vein at bedtime as needed for sleep.  0  . empagliflozin (JARDIANCE) 25 MG TABS tablet Take 25 mg by mouth daily.    . feeding supplement, GLUCERNA SHAKE, (GLUCERNA SHAKE) LIQD Take 237 mLs by mouth 2 (two) times daily between meals.  0  . insulin aspart (NOVOLOG) 100 UNIT/ML injection Inject 8 Units into the skin 3 (three) times daily before meals.    Derrill Memo ON 03/09/2020] irbesartan (AVAPRO) 75 MG tablet Take 0.5 tablets (37.5 mg total) by mouth daily.    . metFORMIN (GLUCOPHAGE) 500 MG tablet Take 500-1,000 mg by mouth See admin instructions. Take 2 tablets by mouth every morning and 1 tablet every evening    . metoprolol tartrate (LOPRESSOR) 25 MG tablet Take 1 tablet (25 mg total) by mouth 2 (two) times daily.    Derrill Memo ON 03/09/2020] polyethylene glycol (MIRALAX / GLYCOLAX) 17 g packet Take 17 g by mouth daily. 14 each 0  . [START ON 03/09/2020] rosuvastatin (CRESTOR) 40 MG tablet Take 1 tablet (40 mg total) by mouth daily.    Marland Kitchen senna-docusate (SENOKOT-S) 8.6-50 MG tablet Take 1 tablet by mouth at bedtime.      Drug Regimen Review  Drug regimen was reviewed and remains appropriate with no significant issues identified  Home: Home Living Family/patient expects to be discharged to:: Inpatient rehab Living Arrangements: Spouse/significant other   Functional History:    Functional Status:  Mobility:          ADL:    Cognition: Cognition Orientation Level: Oriented X4     There were no vitals taken for this visit. Physical Exam Vitals and nursing note reviewed.  Constitutional:      Appearance: Normal  appearance.     Comments: Pleasant and smiling. Flushed cheeks. Was finishing PT--noted to have scissoring with mild ataxia and  tendency to lose balance backwards when standing due to distraction  Obese male, with wife at bedside; appropriate but tangential and distracted- focused on TV show, not interview, NAD, laying supine in bed.   HENT:     Head:     Comments: Smile equal; tongue midline    Right Ear: External ear normal.     Left Ear: External ear normal.     Nose: Nose normal. No congestion.     Mouth/Throat:     Mouth: Mucous membranes are moist.     Pharynx: Oropharynx is clear. No oropharyngeal exudate.  Eyes:     General:        Right eye: No discharge.        Left eye: No discharge.     Extraocular Movements: Extraocular movements intact.  Cardiovascular:     Comments: Bradycardic- regular rhythm- no M/R/G heard Pulmonary:  Comments: CTA B/L- no W/R/R- good air movement Abdominal:     General: There is distension.     Tenderness: There is no abdominal tenderness.     Comments: Soft, protuberant/? Distended somewhat; NT; hypoactive BS  Genitourinary:    Penis: Normal.      Comments: wearing condom cathter- light amber urine in bag Musculoskeletal:     Cervical back: Normal range of motion and neck supple. No rigidity.     Comments: Deltoids, biceps, triceps 4+/5; WE, grip and finger abd 5-/5 B/L HF, KE, KF, DF and PF 5-/5 B/L   Skin:    General: Skin is warm and dry.     Comments: Ecchymotic area mid sternum has resolved and few scabbed areas noted.    Large purple bruise inner L arm from fall last night- chest yellow bruise from CPR; wearing life vest; R antecubital fossa IV and L forearm IV- no infiltrate Smear on backside, but no skin breakfdown  Neurological:     Mental Status: He is alert.     Comments: Speech slow and repetitive/perseverative at times but easy to redirect. He was to state name, place, DOB but not age, Pres. Looked to his name band for  date "02/28/20" and reported that he was here because he had heart problems earlier this summer. Knew his wife was a Pharmacist, hospital. Has a sister named "Pam" but could not recall if he had any children. He was able to follow basic commands without difficulty.   Knew year, month, but not date; knew next holiday Labor day; knew in [place] for Rehab due to heart attack, 4th floor.  Intact to light touch in all 4 extremities  Psychiatric:     Comments: Tangential, forgetful    Results for orders placed or performed during the hospital encounter of 02/28/20 (from the past 48 hour(s))  Glucose, capillary     Status: Abnormal   Collection Time: 03/06/20  4:10 PM  Result Value Ref Range   Glucose-Capillary 195 (H) 70 - 99 mg/dL    Comment: Glucose reference range applies only to samples taken after fasting for at least 8 hours.  Glucose, capillary     Status: Abnormal   Collection Time: 03/06/20  9:06 PM  Result Value Ref Range   Glucose-Capillary 191 (H) 70 - 99 mg/dL    Comment: Glucose reference range applies only to samples taken after fasting for at least 8 hours.  CBC     Status: Abnormal   Collection Time: 03/07/20  6:12 AM  Result Value Ref Range   WBC 10.4 4.0 - 10.5 K/uL   RBC 4.54 4.22 - 5.81 MIL/uL   Hemoglobin 14.8 13.0 - 17.0 g/dL   HCT 43.6 39 - 52 %   MCV 96.0 80.0 - 100.0 fL   MCH 32.6 26.0 - 34.0 pg   MCHC 33.9 30.0 - 36.0 g/dL   RDW 11.3 (L) 11.5 - 15.5 %   Platelets 237 150 - 400 K/uL   nRBC 0.0 0.0 - 0.2 %    Comment: Performed at [facility], [place] [address] [place], [place] [address]  Glucose, capillary     Status: Abnormal   Collection Time: 03/07/20  7:25 AM  Result Value Ref Range   Glucose-Capillary 153 (H) 70 - 99 mg/dL    Comment: Glucose reference range applies only to samples taken after fasting for at least 8 hours.  Glucose, capillary     Status: Abnormal   Collection Time:  03/07/20 11:34 AM  Result Value Ref Range   Glucose-Capillary 294 (H) 70 -  99 mg/dL    Comment: Glucose reference range applies only to samples taken after fasting for at least 8 hours.  Glucose, capillary     Status: Abnormal   Collection Time: 03/07/20  4:58 PM  Result Value Ref Range   Glucose-Capillary 207 (H) 70 - 99 mg/dL    Comment: Glucose reference range applies only to samples taken after fasting for at least 8 hours.  Glucose, capillary     Status: Abnormal   Collection Time: 03/07/20  9:04 PM  Result Value Ref Range   Glucose-Capillary 199 (H) 70 - 99 mg/dL    Comment: Glucose reference range applies only to samples taken after fasting for at least 8 hours.  Glucose, capillary     Status: Abnormal   Collection Time: 03/08/20  6:01 AM  Result Value Ref Range   Glucose-Capillary 191 (H) 70 - 99 mg/dL    Comment: Glucose reference range applies only to samples taken after fasting for at least 8 hours.  Glucose, capillary     Status: Abnormal   Collection Time: 03/08/20  7:33 AM  Result Value Ref Range   Glucose-Capillary 178 (H) 70 - 99 mg/dL    Comment: Glucose reference range applies only to samples taken after fasting for at least 8 hours.  Comprehensive metabolic panel     Status: Abnormal   Collection Time: 03/08/20  9:31 AM  Result Value Ref Range   Sodium 136 135 - 145 mmol/L   Potassium 4.4 3.5 - 5.1 mmol/L   Chloride 104 98 - 111 mmol/L   CO2 23 22 - 32 mmol/L   Glucose, Bld 290 (H) 70 - 99 mg/dL    Comment: Glucose reference range applies only to samples taken after fasting for at least 8 hours.   BUN 23 8 - 23 mg/dL   Creatinine, Ser 0.85 0.61 - 1.24 mg/dL   Calcium 8.9 8.9 - 10.3 mg/dL   Total Protein 6.7 6.5 - 8.1 g/dL   Albumin 3.1 (L) 3.5 - 5.0 g/dL   AST 57 (H) 15 - 41 U/L   ALT 61 (H) 0 - 44 U/L   Alkaline Phosphatase 47 38 - 126 U/L   Total Bilirubin 0.5 0.3 - 1.2 mg/dL   GFR calc non Af Amer >60 >60 mL/min   GFR calc Af Amer >60 >60 mL/min   Anion gap 9 5 - 15    Comment: Performed at [facility]  [address] [place], [place] [address]  Glucose, capillary     Status: Abnormal   Collection Time: 03/08/20 11:26 AM  Result Value Ref Range   Glucose-Capillary 234 (H) 70 - 99 mg/dL    Comment: Glucose reference range applies only to samples taken after fasting for at least 8 hours.   CT HEAD WO CONTRAST  Result Date: 03/08/2020 CLINICAL DATA:  Traumatic brain injury, change in neurologic status, history of anoxic brain injury EXAM: CT HEAD WITHOUT CONTRAST TECHNIQUE: Contiguous axial images were obtained from the base of the skull through the vertex without intravenous contrast. COMPARISON:  03/04/2020, 03/03/2020 FINDINGS: Brain: No acute infarct or hemorrhage. Lateral ventricles and midline structures are stable. No acute extra-axial fluid collections. No mass effect. Vascular: No hyperdense vessel or unexpected calcification. Skull: Normal. Negative for fracture or focal lesion. Sinuses/Orbits: Minimal mucosal thickening right maxillary sinus. Remaining sinuses are clear. Other: None. IMPRESSION: No acute intracranial  process. Electronically Signed   By: Randa Ngo M.D.   On: 03/08/2020 00:37       Medical Problem List and Plan: 1.  Impaired function secondary to anoxic brain injury from prolonged CPR/STEMIsp cardiac arrest  -patient may  Shower- but need to cover life vest  -ELOS/Goals: supervision- 2.5-3 weeks 2.  Antithrombotics: -DVT/anticoagulation:  Pharmaceutical: Lovenox--will start  -antiplatelet therapy: On ASA/Plavix.  3. Pain Management: N/a 4. Mood: LCSW to follow for evaluation and support.   -antipsychotic agents: N/a 5. Neuropsych: This patient is NOT capable of making decisions on his own behalf. Will order safety precautions for now- maybe a telesitter 6. Skin/Wound Care: Monitor chest wall for healing.  7. Fluids/Electrolytes/Nutrition: Monitor I/O. Check lytes in am. Will add prosource for supplement and multivitamin. On soft diet  8. ACS with diffuse CAD: Treated  medically with  ASA/Plavix/Avapro/lopressor/crestor.  9. NSVT: Life vest to be placed prior to admission. On low dose lopressor bid. 10. T2DM: Hgb A1c- 8.2.  PTA was on Tresiba 90 units, novolog 8 units tidac, Jardince 25 mg and metformin 1000 mg/500 mg. Intake has improved with blood sugars trending upwards (has magic cup, 2 sodas and glucerna with his lunch). Will add carb modified restrictions to meals and resume Jardiance today.  Continue to monitor for now and titrate up to home meds as indicated.  11. Abnormal LFTs: Ammonia levels improving 46-->31. Recheck labs in am. 12. Indigestion/GERD- was on Prilosec 20 mg daily at home- will order Ca Carbonate as well as Protonix 20 mg daily- might need higher dose.  Started these meds.  13. Diarrhea per wife- might need to hold bowel meds- will monitor- no BM today, so far, but was going 2-3x/day- loose stools with bowel meds.     Bary Leriche, PA-C 03/08/2020   I have personally performed a face to face diagnostic evaluation of this patient and formulated the key components of the plan.  Additionally, I have personally reviewed laboratory data, imaging studies, as well as relevant notes and concur with the physician assistant's documentation above.   The patient's status has not changed from the original H&P.  Any changes in documentation from the acute care chart have been noted above.     Courtney Heys, MD 03/08/2020

## 2020-03-08 NOTE — Progress Notes (Signed)
Charlett Blake, MD  Physician  Physical Medicine and Rehabilitation  Consult Note      Signed  Date of Service:  03/05/2020  8:32 AM      Related encounter: ED to Hosp-Admission (Discharged) from 02/28/2020 in [facility]      Signed      Expand All Collapse All  Show:Clear all [x] Manual[x] Template[] Copied  Added by: [x] Kirsteins, Luanna Salk, MD[x] Love, Ivan Anchors, PA-C  [] Hover for details          Physical Medicine and Rehabilitation Consult     Reason for Consult: Functional deficits due to ABI .  Referring Physician: Dr. Chase Caller     HPI: Richard Hatfield is a 64 y.o. male with history of HTN, T2DM, ETOH abuse; who was admitted 07/13/21after cardiac arrest. Per reports, Wife woke up husband having jerking movements and difficulty breathing,activated EMS, performed CPR X 8 minutes followed by 12 minutes of CPR/ACLS for VT by EMS with ROSC. Patient mildly responsive to sternal rubs at admission and intubated in ED. UDS negative. EKG with mild ST changes and he was taken to cath lab and was found to have diffuse CAD with EF 45-50% and no acute thrombus.  VF arrest felt to be due to transient LAD coronary spasm superimposed on significant multivessel CAD and CVTS consult recommended for input. He continued to have decrease in LOC with sluggish movements and EEG showed severe diffuse encephalopathy felt to be due to sedation or anoxic BI and was negative for seizures.    He was extubated without difficulty on 07/15 and was noted to have confusion with bouts of agitation question ETOH withdrawal v/s delirium.  He continues on IV heparin as well as IV lopressor due to NSVT. He received IV ativan on am of 07/17 with obtundation, hypoxia with bouts of apnea and sluggish pupils therefore code stroke activated and he was transferred to ICU.  Neurology consulted for input and questioned stroke as cause of MS changes. CT  Head was negative for bleed or acute changes. CTA  head/neck and cerebral perfusion study done and was negative for infarct or ischemia. MRI done revealing T2 flare felt to be due to anoxic injury. Bedside swallow showed signs of dysphagia and full liquid diet recommended. He was also noted to be severely dysarthric with rapid rate of speech, verbose, cognitive deficits and weakness. CIR recommended due to functional decline.      Review of Systems  Reason unable to perform ROS: Confusion.            Past Medical History:  Diagnosis Date  . Diabetes mellitus without complication ([place])    . Hypertension             Past Surgical History:  Procedure Laterality Date  . LEFT HEART CATH AND CORONARY ANGIOGRAPHY N/A 02/28/2020    Procedure: LEFT HEART CATH AND CORONARY ANGIOGRAPHY;  Surgeon: Troy Sine, MD;  Location: [facility];  Service: Cardiovascular;  Laterality: N/A;      Family History: Unable to elicit due to mentation.        Social History:  Married. 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 He  reports that he has never smoked. He has never used smokeless tobacco. He reports previous alcohol use--quit in Jan?. He reports that he does not use drugs.      Allergies: No Known Allergies      Medications Prior to Admission  Medication Sig Dispense Refill  .  cetirizine (ZYRTEC) 10 MG chewable tablet Chew 10 mg by mouth daily.      . colestipol (COLESTID) 1 g tablet Take 1 g by mouth 2 (two) times daily.      Marland Kitchen diltiazem (DILACOR XR) 180 MG 24 hr capsule Take 180 mg by mouth daily.      . empagliflozin (JARDIANCE) 25 MG TABS tablet Take 25 mg by mouth daily.      . fenofibrate (TRICOR) 145 MG tablet Take 145 mg by mouth daily.      . insulin aspart (NOVOLOG) 100 UNIT/ML injection Inject 8 Units into the skin 3 (three) times daily before meals.      . Insulin Degludec (TRESIBA) 100 UNIT/ML SOLN Inject 90 Units into the skin daily.      . metFORMIN (GLUCOPHAGE) 500 MG tablet Take 500-1,000 mg by mouth  See admin instructions. Take 2 tablets by mouth every morning and 1 tablet every evening      . pravastatin (PRAVACHOL) 40 MG tablet Take 40 mg by mouth daily.      . valsartan (DIOVAN) 320 MG tablet Take 320 mg by mouth daily.          Home: Home Living Family/patient expects to be discharged to:: Private residence Living Arrangements: Spouse/significant other Available Help at Discharge: Family Type of Home: House Home Access: Stairs to enter Technical brewer of Steps: 1 Home Layout: Two level, Able to live on main level with bedroom/bathroom Home Equipment: None  Functional History: Prior Function Level of Independence: Independent Comments: works as Primary school teacher Status:  Mobility: [place] bed mobility: Needs Assistance Bed Mobility: Supine to Sit, Sit to Supine Supine to sit: +2 for physical assistance, Total assist Sit to supine: +2 for physical assistance, Total assist General bed mobility comments: Assist to bring legs off of bed, elevate trunk into sitting and bring hips to EOB. Assist to lower trunk and bring legs back up into bed.   ADL:   Cognition: Cognition Overall Cognitive Status: Impaired/Different from baseline Orientation Level: Oriented to person, Oriented to place Cognition Arousal/Alertness: Lethargic Behavior During Therapy: Flat affect Overall Cognitive Status: Impaired/Different from baseline Area of Impairment: Orientation, Attention, Memory, Following commands, Safety/judgement, Problem solving, Awareness Orientation Level: Disoriented to, Place, Time, Situation Current Attention Level: Focused Memory: Decreased short-term memory Following Commands: Follows one step commands inconsistently, Follows one step commands with increased time Safety/Judgement: Decreased awareness of safety, Decreased awareness of deficits Awareness: Intellectual Problem Solving: Slow processing, Decreased initiation,  Difficulty sequencing, Requires verbal cues, Requires tactile cues General Comments: Would arouse with verbal and tactile stimuli. More awake sitting EOB.   Blood pressure (!) 156/89, pulse 73, temperature 98 F (36.7 C), temperature source Oral, resp. rate 19, height 5\' 8"  (1.727 m), weight 84 kg, SpO2 99 %. Physical Exam Vitals and nursing note reviewed.  Constitutional:      Appearance: Normal appearance.     Comments: Sitter in room on the right and mitten on left hand.   HENT:     Mouth/Throat:     Mouth: Mucous membranes are dry.  Eyes:     Extraocular Movements: Extraocular movements intact.     Conjunctiva/sclera: Conjunctivae normal.     Pupils: Pupils are equal, round, and reactive to light.  Cardiovascular:     Rate and Rhythm: Normal rate and regular rhythm.  Pulmonary:     Effort: Pulmonary effort is normal. No respiratory distress.     Breath sounds: Normal  breath sounds. No stridor.  Abdominal:     General: Abdomen is flat. Bowel sounds are normal. There is no distension.     Palpations: Abdomen is soft.  Musculoskeletal:     Cervical back: Normal range of motion. No rigidity or tenderness.  Skin:    General: Skin is warm and dry.  Neurological:     Mental Status: He is alert and oriented to person, place, and time.     Comments: Slow and severely dysarthric speech. He was oriented to self and place as hospital but thought he was in [place] etc--unable to correct. Able to state DOB--age 87. Month "November". Slow movements but was able to follow simple motor commands.   Motor strength is 4/5 bilateral deltoid bicep tricep grip 3 - at the hip flexors knee extensors for the ankle dorsiflexors plantar flexor Patient reports sensation is equal to light touch bilateral upper and lower extremities he is able to identify which digit is touched bilaterally.  Psychiatric:        Mood and Affect: Mood normal.        Behavior: Behavior normal.      Lab Results Last 24  Hours       Results for orders placed or performed during the hospital encounter of 02/28/20 (from the past 24 hour(s))  Glucose, capillary     Status: Abnormal    Collection Time: 03/04/20 11:11 AM  Result Value Ref Range    Glucose-Capillary 182 (H) 70 - 99 mg/dL  Glucose, capillary     Status: Abnormal    Collection Time: 03/04/20  5:05 PM  Result Value Ref Range    Glucose-Capillary 146 (H) 70 - 99 mg/dL  Glucose, capillary     Status: Abnormal    Collection Time: 03/04/20  7:37 PM  Result Value Ref Range    Glucose-Capillary 251 (H) 70 - 99 mg/dL  Glucose, capillary     Status: Abnormal    Collection Time: 03/05/20 12:03 AM  Result Value Ref Range    Glucose-Capillary 168 (H) 70 - 99 mg/dL  Heparin level (unfractionated)     Status: None    Collection Time: 03/05/20  2:15 AM  Result Value Ref Range    Heparin Unfractionated 0.55 0.30 - 0.70 IU/mL  CBC     Status: None    Collection Time: 03/05/20  2:15 AM  Result Value Ref Range    WBC 8.8 4.0 - 10.5 K/uL    RBC 4.80 4.22 - 5.81 MIL/uL    Hemoglobin 15.4 13.0 - 17.0 g/dL    HCT 47.7 39 - 52 %    MCV 99.4 80.0 - 100.0 fL    MCH 32.1 26.0 - 34.0 pg    MCHC 32.3 30.0 - 36.0 g/dL    RDW 11.9 11.5 - 15.5 %    Platelets 237 150 - 400 K/uL    nRBC 0.0 0.0 - 0.2 %  Basic metabolic panel     Status: Abnormal    Collection Time: 03/05/20  2:15 AM  Result Value Ref Range    Sodium 148 (H) 135 - 145 mmol/L    Potassium 4.0 3.5 - 5.1 mmol/L    Chloride 111 98 - 111 mmol/L    CO2 23 22 - 32 mmol/L    Glucose, Bld 153 (H) 70 - 99 mg/dL    BUN 34 (H) 8 - 23 mg/dL    Creatinine, Ser 0.88 0.61 - 1.24 mg/dL    Calcium 10.1 8.9 -  10.3 mg/dL    GFR calc non Af Amer >60 >60 mL/min    GFR calc Af Amer >60 >60 mL/min    Anion gap 14 5 - 15  Glucose, capillary     Status: Abnormal    Collection Time: 03/05/20  5:00 AM  Result Value Ref Range    Glucose-Capillary 159 (H) 70 - 99 mg/dL  Glucose, capillary     Status: Abnormal     Collection Time: 03/05/20  8:33 AM  Result Value Ref Range    Glucose-Capillary 280 (H) 70 - 99 mg/dL       Imaging Results (Last 48 hours)  CT Code Stroke CTA Head W/WO contrast   Result Date: 03/03/2020 CLINICAL DATA:  Aphasia.  Rule out stroke. EXAM: CT ANGIOGRAPHY HEAD AND NECK CT PERFUSION BRAIN TECHNIQUE: Multidetector CT imaging of the head and neck was performed using the standard protocol during bolus administration of intravenous contrast. Multiplanar CT image reconstructions and MIPs were obtained to evaluate the vascular anatomy. Carotid stenosis measurements (when applicable) are obtained utilizing NASCET criteria, using the distal internal carotid diameter as the denominator. Multiphase CT imaging of the brain was performed following IV bolus contrast injection. Subsequent parametric perfusion maps were calculated using RAPID software. CONTRAST:  162mL OMNIPAQUE IOHEXOL 350 MG/ML SOLN COMPARISON:  CT head 03/03/2020 FINDINGS: FINDINGS CTA NECK FINDINGS Aortic arch: Standard branching. Imaged portion shows no evidence of aneurysm or dissection. No significant stenosis of the major arch vessel origins. Right carotid system: The patient moved during scanning through the neck and head. There is significant artifact of the right carotid bifurcation which is not well evaluated. Right internal carotid artery is patent above the bifurcation. No definite calcification. Left carotid system: Left carotid bifurcation is patent. There is extensive artifact due to motion in the cervical internal carotid artery which is not well evaluated. Distal left cervical internal carotid artery appears normal. Vertebral arteries: Patient moved causing significant artifact. Allowing for this, no vertebral artery abnormality identified. Skeleton: Limited evaluation due to motion. No acute skeletal lesion identified. Other neck: Negative Upper chest: Small left effusion. Mild atelectasis or infiltrate in the right upper  lobe posteriorly. Review of the MIP images confirms the above findings CTA HEAD FINDINGS Anterior circulation: Atherosclerotic calcification in the cavernous carotid bilaterally causing mild stenosis. Middle cerebral arteries patent bilaterally without stenosis identified. Anterior cerebral arteries are obscured by motion. Posterior circulation: Both vertebral arteries patent to the basilar. Basilar patent. Posterior cerebral arteries patent bilaterally. Venous sinuses: Normal enhancement Anatomic variants: None Review of the MIP images confirms the above findings CT Brain Perfusion Findings: ASPECTS: 10 CBF (<30%) Volume: 41mL Perfusion (Tmax>6.0s) volume: 66mL Mismatch Volume: 20mL Infarction Location:None IMPRESSION: 1. Images degraded by extensive motion through the carotid bifurcation and through the head. 2. No significant intracranial or extracranial stenosis. Repeat study recommended if symptoms warrant. 3. CT perfusion negative for acute infarct or ischemia. Electronically Signed   By: Franchot Gallo M.D.   On: 03/03/2020 11:46    CT HEAD WO CONTRAST   Result Date: 03/03/2020 CLINICAL DATA:  Encephalopathy. EXAM: CT HEAD WITHOUT CONTRAST TECHNIQUE: Contiguous axial images were obtained from the base of the skull through the vertex without intravenous contrast. COMPARISON:  CT head 02/28/2020 FINDINGS: Brain: No evidence of acute infarction, hemorrhage, hydrocephalus, extra-axial collection or mass lesion/mass effect. Vascular: Negative for hyperdense vessel Skull: Negative Sinuses/Orbits: Mild mucosal edema paranasal sinuses. Negative orbit Other: None IMPRESSION: Negative CT head Preliminary report texted to Dr. Rory Percy  Electronically Signed   By: Franchot Gallo M.D.   On: 03/03/2020 11:02    CT Code Stroke CTA Neck W/WO contrast   Result Date: 03/03/2020 CLINICAL DATA:  Aphasia.  Rule out stroke. EXAM: CT ANGIOGRAPHY HEAD AND NECK CT PERFUSION BRAIN TECHNIQUE: Multidetector CT imaging of the head and  neck was performed using the standard protocol during bolus administration of intravenous contrast. Multiplanar CT image reconstructions and MIPs were obtained to evaluate the vascular anatomy. Carotid stenosis measurements (when applicable) are obtained utilizing NASCET criteria, using the distal internal carotid diameter as the denominator. Multiphase CT imaging of the brain was performed following IV bolus contrast injection. Subsequent parametric perfusion maps were calculated using RAPID software. CONTRAST:  136mL OMNIPAQUE IOHEXOL 350 MG/ML SOLN COMPARISON:  CT head 03/03/2020 FINDINGS: FINDINGS CTA NECK FINDINGS Aortic arch: Standard branching. Imaged portion shows no evidence of aneurysm or dissection. No significant stenosis of the major arch vessel origins. Right carotid system: The patient moved during scanning through the neck and head. There is significant artifact of the right carotid bifurcation which is not well evaluated. Right internal carotid artery is patent above the bifurcation. No definite calcification. Left carotid system: Left carotid bifurcation is patent. There is extensive artifact due to motion in the cervical internal carotid artery which is not well evaluated. Distal left cervical internal carotid artery appears normal. Vertebral arteries: Patient moved causing significant artifact. Allowing for this, no vertebral artery abnormality identified. Skeleton: Limited evaluation due to motion. No acute skeletal lesion identified. Other neck: Negative Upper chest: Small left effusion. Mild atelectasis or infiltrate in the right upper lobe posteriorly. Review of the MIP images confirms the above findings CTA HEAD FINDINGS Anterior circulation: Atherosclerotic calcification in the cavernous carotid bilaterally causing mild stenosis. Middle cerebral arteries patent bilaterally without stenosis identified. Anterior cerebral arteries are obscured by motion. Posterior circulation: Both vertebral  arteries patent to the basilar. Basilar patent. Posterior cerebral arteries patent bilaterally. Venous sinuses: Normal enhancement Anatomic variants: None Review of the MIP images confirms the above findings CT Brain Perfusion Findings: ASPECTS: 10 CBF (<30%) Volume: 58mL Perfusion (Tmax>6.0s) volume: 27mL Mismatch Volume: 64mL Infarction Location:None IMPRESSION: 1. Images degraded by extensive motion through the carotid bifurcation and through the head. 2. No significant intracranial or extracranial stenosis. Repeat study recommended if symptoms warrant. 3. CT perfusion negative for acute infarct or ischemia. Electronically Signed   By: Franchot Gallo M.D.   On: 03/03/2020 11:46    MR BRAIN WO CONTRAST   Result Date: 03/05/2020 CLINICAL DATA:  Initial evaluation for acute encephalopathy. Status post cardiac arrest. EXAM: MRI HEAD WITHOUT CONTRAST TECHNIQUE: Multiplanar, multiecho pulse sequences of the brain and surrounding structures were obtained without intravenous contrast. COMPARISON:  Prior CTs from 03/03/2020. FINDINGS: Brain: Examination degraded by motion artifact. Generalized age-related cerebral atrophy. There is subtly increased symmetric T2/FLAIR signal abnormality seen involving the dorsal medial aspects of the thalami (series 11, image 12), nonspecific, but suspected to be related to a degree of anoxic injury given provided history. No associated mass effect or hemorrhage. No other focal parenchymal signal abnormality or evidence for significant cerebral anoxia elsewhere within the brain. No evidence for acute or subacute infarct. Gray-white matter differentiation maintained. No areas of remote cortical infarction. No evidence for acute or chronic intracranial hemorrhage. No mass lesion, midline shift or mass effect. No hydrocephalus or extra-axial fluid collection. Pituitary gland suprasellar region within normal limits. Midline structures intact. Vascular: Major intracranial vascular flow voids  are maintained. Skull  and upper cervical spine: Craniocervical junction normal. Bone marrow signal intensity within normal limits. No scalp soft tissue abnormality. Sinuses/Orbits: Globes and orbital soft tissues within normal limits. Mild scattered mucosal thickening noted within the paranasal sinuses. No mastoid effusion. Inner ear structures grossly normal. Other: None. IMPRESSION: 1. 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 2. Otherwise normal brain MRI for age. No other acute intracranial abnormality. Electronically Signed   By: Jeannine Boga M.D.   On: 03/05/2020 00:14    CT Code Stroke Cerebral Perfusion with contrast   Result Date: 03/03/2020 CLINICAL DATA:  Aphasia.  Rule out stroke. EXAM: CT ANGIOGRAPHY HEAD AND NECK CT PERFUSION BRAIN TECHNIQUE: Multidetector CT imaging of the head and neck was performed using the standard protocol during bolus administration of intravenous contrast. Multiplanar CT image reconstructions and MIPs were obtained to evaluate the vascular anatomy. Carotid stenosis measurements (when applicable) are obtained utilizing NASCET criteria, using the distal internal carotid diameter as the denominator. Multiphase CT imaging of the brain was performed following IV bolus contrast injection. Subsequent parametric perfusion maps were calculated using RAPID software. CONTRAST:  189mL OMNIPAQUE IOHEXOL 350 MG/ML SOLN COMPARISON:  CT head 03/03/2020 FINDINGS: FINDINGS CTA NECK FINDINGS Aortic arch: Standard branching. Imaged portion shows no evidence of aneurysm or dissection. No significant stenosis of the major arch vessel origins. Right carotid system: The patient moved during scanning through the neck and head. There is significant artifact of the right carotid bifurcation which is not well evaluated. Right internal carotid artery is patent above the  bifurcation. No definite calcification. Left carotid system: Left carotid bifurcation is patent. There is extensive artifact due to motion in the cervical internal carotid artery which is not well evaluated. Distal left cervical internal carotid artery appears normal. Vertebral arteries: Patient moved causing significant artifact. Allowing for this, no vertebral artery abnormality identified. Skeleton: Limited evaluation due to motion. No acute skeletal lesion identified. Other neck: Negative Upper chest: Small left effusion. Mild atelectasis or infiltrate in the right upper lobe posteriorly. Review of the MIP images confirms the above findings CTA HEAD FINDINGS Anterior circulation: Atherosclerotic calcification in the cavernous carotid bilaterally causing mild stenosis. Middle cerebral arteries patent bilaterally without stenosis identified. Anterior cerebral arteries are obscured by motion. Posterior circulation: Both vertebral arteries patent to the basilar. Basilar patent. Posterior cerebral arteries patent bilaterally. Venous sinuses: Normal enhancement Anatomic variants: None Review of the MIP images confirms the above findings CT Brain Perfusion Findings: ASPECTS: 10 CBF (<30%) Volume: 65mL Perfusion (Tmax>6.0s) volume: 78mL Mismatch Volume: 71mL Infarction Location:None IMPRESSION: 1. Images degraded by extensive motion through the carotid bifurcation and through the head. 2. No significant intracranial or extracranial stenosis. Repeat study recommended if symptoms warrant. 3. CT perfusion negative for acute infarct or ischemia. Electronically Signed   By: Franchot Gallo M.D.   On: 03/03/2020 11:46    DG Chest Port 1 View   Result Date: 03/04/2020 CLINICAL DATA:  Acute respiratory failure with hypoxia EXAM: PORTABLE CHEST 1 VIEW COMPARISON:  Chest radiograph 03/03/2020 FINDINGS: With stable cardiomediastinal contours with enlarged heart size. There are persistent very mild bilateral interstitial opacities,  possibly chronic lung disease. No focal consolidation. No pneumothorax or significant pleural effusion. IMPRESSION: Stable chest with persistent mild bilateral interstitial opacities. Electronically Signed   By: Audie Pinto M.D.   On: 03/04/2020 15:26    DG Chest Port 1 View   Result Date:  03/03/2020 CLINICAL DATA:  Acute respiratory distress. EXAM: PORTABLE CHEST 1 VIEW COMPARISON:  03/01/2020 FINDINGS: Borderline enlarged cardiac silhouette with an interval decrease in size. The endotracheal tube and nasogastric tube have been removed. Mildly decreased prominence of the interstitial markings with resolved left lung airspace opacities and right lung linear densities. Unremarkable bones. IMPRESSION: 1. Resolved left lung alveolar edema and right lung atelectasis. 2. Improved probable combination of interstitial pulmonary edema and chronic interstitial lung disease. Electronically Signed   By: Claudie Revering M.D.   On: 03/03/2020 10:57    EEG adult   Result Date: 03/03/2020 Lora Havens, MD     03/03/2020  1:50 PM Patient Name: Richard Hatfield MRN: [id number] Epilepsy Attending: Lora Havens Referring Physician/Provider: Dr Amie [place] Date: 7/08/25/2019 Duration: 23.08 mins Patient history: 64 year old past history of diabetes, hypertension, with no known coronary disease brought in after cardiac arrest requiring 10 minutes of CPR. Had worsening mental status today. EEG to evaluate for seizure. Level of alertness: comatose AEDs during EEG study: None Technical aspects: This EEG study was done with scalp electrodes positioned according to the 10-20 International system of electrode placement. Electrical activity was acquired at a sampling rate of 500Hz  and reviewed with a high frequency filter of 70Hz  and a low frequency filter of 1Hz . EEG data were recorded continuously and digitally stored. Description: EEG showed continuous generalized 5-8 Hz theta slowing as well as 2-3Hz  delta slowing.  Hyperventilation and photic stimulation were not performed.   ABNORMALITY -Continuous slow, generalized IMPRESSION: This study is suggestive of severe diffuse encephalopathy, nonspecific etiology. No seizures or epileptiform discharges were seen throughout the recording. Priyanka [name]         Assessment/Plan: Diagnosis: Hypoxic/ischemic encephalopathy following cardiac arrest 1. Does the need for close, 24 hr/day medical supervision in concert with the patient's rehab needs make it unreasonable for this patient to be served in a less intensive setting? Yes 2. Co-Morbidities requiring supervision/potential complications: Hypertension, diabetes, history of ethanol abuse 3. Due to bladder management, bowel management, safety, skin/wound care, disease management, medication administration, pain management and patient education, does the patient require 24 hr/day rehab nursing? Yes 4. Does the patient require coordinated care of a physician, rehab nurse, therapy disciplines of PT, OT, speech therapy to address physical and functional deficits in the context of the above medical diagnosis(es)? Yes Addressing deficits in the following areas: balance, endurance, locomotion, strength, transferring, bowel/bladder control, bathing, dressing, feeding, toileting, cognition and psychosocial support 5. Can the patient actively participate in an intensive therapy program of at least 3 hrs of therapy per day at least 5 days per week? Currently not but should be able to in several days 6. The potential for patient to make measurable gains while on inpatient rehab is good 7. Anticipated functional outcomes upon discharge from inpatient rehab are min assist  with PT, min assist with OT, min assist with SLP. 8. Estimated rehab length of stay to reach the above functional goals is: 21 to 25 days 9. Anticipated discharge destination: Home 10. Overall Rehab/Functional Prognosis: good   RECOMMENDATIONS: This patient's  condition is appropriate for continued rehabilitative care in the following setting: CIR Patient has agreed to participate in recommended program. Potentially Note that insurance prior authorization may be required for reimbursement for recommended care.   Comment: Needs to be able to tolerate PT OT, SLP therapy all in one day day, sit up in recliner 3 hours/day     Bary Leriche, PA-C 03/05/2020    "  I have personally performed a face to face diagnostic evaluation of this patient.  Additionally, I have reviewed and concur with the physician assistant's documentation above." Charlett Blake M.D. [facility] FAAPM&R (Neuromuscular Med) Diplomate Am Board of Electrodiagnostic Med Fellow Am Board of Interventional Pain            Revision History                     Routing History                Note Details  Author Charlett Blake, MD File Time 03/05/2020  3:35 PM  Author Type Physician Status Signed  Last Editor Charlett Blake, MD Service Physical Medicine and [place] # [id number] Admit Date 03/08/2020

## 2020-03-08 NOTE — Progress Notes (Signed)
Inpatient Rehabilitation Admissions Coordinator  Cir bed is available to admit patient to today. I have received clearance to admit from Dr. Orvan Seen and Dr. Horris Latino. I met at bedside with patient to inform him , called his wife, and alerted acute team and TOC . I will make the arrangements to admit today.  Danne Baxter, RN, MSN Rehab Admissions Coordinator [phone] 03/08/2020 10:31 AM

## 2020-03-08 NOTE — Discharge Summary (Signed)
Discharge Summary  Detroit Frieden GUR:[id number] DOB: 16-Jun-1956  PCP: System, Pcp Not In  Admit date: 02/28/2020 Discharge date: 03/08/2020  Time spent: 40 mins  Recommendations for Outpatient Follow-up:  1. Cardiology as scheduled  2. PCP as scheduled 3. Neurology as scheduled   Discharge Diagnoses:  Active Hospital Problems   Diagnosis Date Noted  . Encephalopathy acute   . ACS (acute coronary syndrome) ([place]) 02/28/2020  . Cardiac arrest ([place])   . Type 2 diabetes mellitus with complication, without long-term current use of insulin [facility])     Resolved Hospital Problems  No resolved problems to display.    Discharge Condition: Stable  Diet recommendation: Heart healthy  Vitals:   03/08/20 0531 03/08/20 0811  BP: 119/80 135/73  Pulse: (!) 57   Resp: 13 16  Temp: 97.6 F (36.4 C)   SpO2: 97% 100%    History of present illness:  Patient is a 64 year old male without known history of coronary artery disease who was brought to the emergency department after he had cardiac arrest.  He complained of chest pain on the day of admission and had cardiac arrest.  His wife attempted CPR.  EMS continued CPR for about 10 minutes with 2 cardioversions and 20 (doses with reversal of circulation.  He was intubated in emergency department and emergently taken to Cath Lab. Cardiology consulted.  Plan for CABG.  Patient was transferred to [place] from [facility] on 03/03/2020. On 03/03/20 morning , patient became  apneic, in respiratory distress with tachypnea.  Patient was transferred back to ICU.  Currently he is hemodynamically stable and has been transferred to our service on 03/06/20.    Overnight, patient had an episode of fall without any significant injuries.  Today, patient denies any new complaints, reports being uncomfortable in bed.  Mentation keeps fluctuating, although able to tell me where he is, oriented x3.  Patient stable to be transferred to [facility] for further rehab needs.    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 fib cardiac arrest with reversal of circulation after about 20 minutes of CPR and 2 doses of epi Cardiac catheterization showed three-vessel disease involving LAD, circumflex, RCA. Echo showed ejection fraction of 50 to 55% without regional wall motion abnormality. Cardiology consulted, plan is for CABG after further improvement in the mental status Life-vest  provided Continue on statin, aspirin, plavix,beta-blocker  Anoxic brain injury/encephalopathy/fall Mental status improving Likely from cardiac arrest MRI showed changes from anoxic injury, but no acute intracranial abnormalities Neurology consulted Fall precautions Monitor closely  Nonsustained V. Tach Continue metoprolol  Diabetes mellitus type 2 A1c 8.2, uncontrolled Continue home regimen, aspart 3 times daily, Metformin, Jardiance, hold [place] for now, may restart if blood sugar uncontrolled.  If any hypoglycemic event may hold off  Debility/deconditioning CIR  Aspiration pneumonia Completed antibiotics course  Hypertension Continue amlodipine,avapro          Malnutrition Type:  Nutrition Problem: Increased nutrient needs Etiology: acute illness   Malnutrition Characteristics:  Signs/Symptoms: estimated needs   Nutrition Interventions:  Interventions: Tube feeding, Prostat   Estimated body mass index is 29.04 kg/m as calculated from the following:   Height as of this encounter: 5\' 8"  (1.727 m).   Weight as of this encounter:  86.6 kg.    Procedures:  Mechanical ventilation  Consultations:  Cardiology  Cardiothoracic surgery  PCCM  Neurology    Discharge Exam: BP 135/73 (BP Location: Right Arm)    Pulse (!) 57   Temp 97.6 F (36.4 C) (Oral)   Resp 16   Ht 5\' 8"  (1.727 m)   Wt 86.6 kg   SpO2 100%   BMI 29.04 kg/m   General: NAD, oriented x3 Cardiovascular: S1, S2 present Respiratory: CTA B    Discharge Instructions You were cared for by a hospitalist during your hospital stay. If you have any questions about your discharge medications or the care you received while you were in the hospital after you are discharged, you can call the unit and asked to speak with the hospitalist on call if the hospitalist that took care of you is not available. Once you are discharged, your primary care physician will handle any further medical issues. Please note that NO REFILLS for any discharge medications will be authorized once you are discharged, as it is imperative that you return to your primary care physician (or establish a relationship with a primary care physician if you do not have one) for your aftercare needs so that they can reassess your need for medications and monitor your lab values.  Discharge Instructions    Diet - low sodium heart healthy   Complete by: As directed    Increase activity slowly   Complete by: As directed      Allergies as of 03/08/2020   No Known Allergies     Medication List    STOP taking these medications   cetirizine 10 MG chewable tablet Commonly known as: ZYRTEC   colestipol 1 g tablet Commonly known as: COLESTID   diltiazem 180 MG 24 hr capsule Commonly known as: DILACOR XR   fenofibrate 145 MG tablet Commonly known as: TRICOR   pravastatin 40 MG tablet Commonly known as: PRAVACHOL   Tresiba 100 UNIT/ML Soln Generic drug: Insulin Degludec   valsartan 320 MG tablet Commonly known as: DIOVAN     TAKE these medications   amLODipine 5 MG tablet Commonly known as: NORVASC Take 1 tablet (5 mg total) by mouth daily. Start taking on: March 09, 2020   aspirin 81 MG chewable tablet Chew 1 tablet (81 mg total) by mouth daily. Start  taking on: March 09, 2020   clopidogrel 75 MG tablet Commonly known as: PLAVIX Take 1 tablet (75 mg total) by mouth daily. Start taking on: March 09, 2020   diphenhydrAMINE 50 MG/ML injection Commonly known as: BENADRYL Inject 0.5 mLs (25 mg total) into the vein at bedtime as needed for sleep.   feeding supplement (GLUCERNA SHAKE) Liqd Take 237 mLs by mouth 2 (two) times daily between meals.   insulin aspart 100 UNIT/ML injection Commonly known as: novoLOG Inject 8 Units into the skin 3 (three) times daily before meals.   irbesartan 75 MG tablet Commonly known as: AVAPRO Take 0.5 tablets (37.5 mg total) by mouth daily. Start taking on: March 09, 2020   Jardiance 25 MG Tabs tablet Generic drug: empagliflozin Take 25 mg by mouth daily.   metFORMIN 500 MG tablet Commonly known as: GLUCOPHAGE Take 500-1,000 mg by mouth See admin instructions. Take 2 tablets by mouth every morning and 1 tablet every evening   metoprolol tartrate 25 MG tablet Commonly known as: LOPRESSOR Take 1 tablet (25 mg total) by mouth 2 (two) times daily.   polyethylene glycol 17 g  packet Commonly known as: MIRALAX / GLYCOLAX Take 17 g by mouth daily. Start taking on: March 09, 2020   rosuvastatin 40 MG tablet Commonly known as: CRESTOR Take 1 tablet (40 mg total) by mouth daily. Start taking on: March 09, 2020   senna-docusate 8.6-50 MG tablet Commonly known as: Senokot-S Take 1 tablet by mouth at bedtime.      No Known Allergies  Follow-up Information    Deberah Pelton, NP Follow up.   Specialty: Cardiology Why: Hospital follow-up scheduled for 04/11/2020 at 2:45pm with Coletta Memos, one of our NPs in our [place]. Please arrive 15 minutes early for check-in. If this date/time does not work for you, please call our office to reschedule.  Contact information: [address] [place] [address] [phone]        Troy Sine, MD. Schedule an appointment as soon as  possible for a visit.   Specialty: Cardiology Why: Once discharged from [place] information: [address] [place] [address] [phone]                The results of significant diagnostics from this hospitalization (including imaging, microbiology, ancillary and laboratory) are listed below for reference.    Significant Diagnostic Studies: CT Code Stroke CTA Head W/WO contrast  Result Date: 03/03/2020 CLINICAL DATA:  Aphasia.  Rule out stroke. EXAM: CT ANGIOGRAPHY HEAD AND NECK CT PERFUSION BRAIN TECHNIQUE: Multidetector CT imaging of the head and neck was performed using the standard protocol during bolus administration of intravenous contrast. Multiplanar CT image reconstructions and MIPs were obtained to evaluate the vascular anatomy. Carotid stenosis measurements (when applicable) are obtained utilizing NASCET criteria, using the distal internal carotid diameter as the denominator. Multiphase CT imaging of the brain was performed following IV bolus contrast injection. Subsequent parametric perfusion maps were calculated using RAPID software. CONTRAST:  181mL OMNIPAQUE IOHEXOL 350 MG/ML SOLN COMPARISON:  CT head 03/03/2020 FINDINGS: FINDINGS CTA NECK FINDINGS Aortic arch: Standard branching. Imaged portion shows no evidence of aneurysm or dissection. No significant stenosis of the major arch vessel origins. Right carotid system: The patient moved during scanning through the neck and head. There is significant artifact of the right carotid bifurcation which is not well evaluated. Right internal carotid artery is patent above the bifurcation. No definite calcification. Left carotid system: Left carotid bifurcation is patent. There is extensive artifact due to motion in the cervical internal carotid artery which is not well evaluated. Distal left cervical internal carotid artery appears normal. Vertebral arteries: Patient moved causing significant artifact. Allowing for  this, no vertebral artery abnormality identified. Skeleton: Limited evaluation due to motion. No acute skeletal lesion identified. Other neck: Negative Upper chest: Small left effusion. Mild atelectasis or infiltrate in the right upper lobe posteriorly. Review of the MIP images confirms the above findings CTA HEAD FINDINGS Anterior circulation: Atherosclerotic calcification in the cavernous carotid bilaterally causing mild stenosis. Middle cerebral arteries patent bilaterally without stenosis identified. Anterior cerebral arteries are obscured by motion. Posterior circulation: Both vertebral arteries patent to the basilar. Basilar patent. Posterior cerebral arteries patent bilaterally. Venous sinuses: Normal enhancement Anatomic variants: None Review of the MIP images confirms the above findings CT Brain Perfusion Findings: ASPECTS: 10 CBF (<30%) Volume: 43mL Perfusion (Tmax>6.0s) volume: 58mL Mismatch Volume: 39mL Infarction Location:None IMPRESSION: 1. Images degraded by extensive motion through the carotid bifurcation and through the head. 2. No significant intracranial or extracranial stenosis. Repeat study recommended if symptoms warrant. 3. CT  perfusion negative for acute infarct or ischemia. Electronically Signed   By: Franchot Gallo M.D.   On: 03/03/2020 11:46   CT HEAD WO CONTRAST  Result Date: 03/08/2020 CLINICAL DATA:  Traumatic brain injury, change in neurologic status, history of anoxic brain injury EXAM: CT HEAD WITHOUT CONTRAST TECHNIQUE: Contiguous axial images were obtained from the base of the skull through the vertex without intravenous contrast. COMPARISON:  03/04/2020, 03/03/2020 FINDINGS: Brain: No acute infarct or hemorrhage. Lateral ventricles and midline structures are stable. No acute extra-axial fluid collections. No mass effect. Vascular: No hyperdense vessel or unexpected calcification. Skull: Normal. Negative for fracture or focal lesion. Sinuses/Orbits: Minimal mucosal thickening  right maxillary sinus. Remaining sinuses are clear. Other: None. IMPRESSION: No acute intracranial process. Electronically Signed   By: Randa Ngo M.D.   On: 03/08/2020 00:37   CT HEAD WO CONTRAST  Result Date: 03/03/2020 CLINICAL DATA:  Encephalopathy. EXAM: CT HEAD WITHOUT CONTRAST TECHNIQUE: Contiguous axial images were obtained from the base of the skull through the vertex without intravenous contrast. COMPARISON:  CT head 02/28/2020 FINDINGS: Brain: No evidence of acute infarction, hemorrhage, hydrocephalus, extra-axial collection or mass lesion/mass effect. Vascular: Negative for hyperdense vessel Skull: Negative Sinuses/Orbits: Mild mucosal edema paranasal sinuses. Negative orbit Other: None IMPRESSION: Negative CT head Preliminary report texted to Dr. Rory Percy Electronically Signed   By: Franchot Gallo M.D.   On: 03/03/2020 11:02   CT HEAD WO CONTRAST  Result Date: 02/28/2020 CLINICAL DATA:  History of cardiac arrest. EXAM: CT HEAD WITHOUT CONTRAST TECHNIQUE: Contiguous axial images were obtained from the base of the skull through the vertex without intravenous contrast. COMPARISON:  None. FINDINGS: Brain: The ventricles are normal in size and configuration. No extra-axial fluid collections are identified. The gray-white differentiation is maintained. No CT findings for acute hemispheric infarction or intracranial hemorrhage. No mass lesions. The brainstem and cerebellum are normal. Vascular: Age advanced vascular calcifications are noted. No hyperdense vessels or obvious aneurysm. Skull: No acute skull fracture. No bone lesion. Sinuses/Orbits: The paranasal sinuses and mastoid air cells are clear. The globes are intact. Other: No scalp lesions, laceration or hematoma. IMPRESSION: Normal head CT. Electronically Signed   By: Marijo Sanes M.D.   On: 02/28/2020 06:10   CT Code Stroke CTA Neck W/WO contrast  Result Date: 03/03/2020 CLINICAL DATA:  Aphasia.  Rule out stroke. EXAM: CT ANGIOGRAPHY  HEAD AND NECK CT PERFUSION BRAIN TECHNIQUE: Multidetector CT imaging of the head and neck was performed using the standard protocol during bolus administration of intravenous contrast. Multiplanar CT image reconstructions and MIPs were obtained to evaluate the vascular anatomy. Carotid stenosis measurements (when applicable) are obtained utilizing NASCET criteria, using the distal internal carotid diameter as the denominator. Multiphase CT imaging of the brain was performed following IV bolus contrast injection. Subsequent parametric perfusion maps were calculated using RAPID software. CONTRAST:  116mL OMNIPAQUE IOHEXOL 350 MG/ML SOLN COMPARISON:  CT head 03/03/2020 FINDINGS: FINDINGS CTA NECK FINDINGS Aortic arch: Standard branching. Imaged portion shows no evidence of aneurysm or dissection. No significant stenosis of the major arch vessel origins. Right carotid system: The patient moved during scanning through the neck and head. There is significant artifact of the right carotid bifurcation which is not well evaluated. Right internal carotid artery is patent above the bifurcation. No definite calcification. Left carotid system: Left carotid bifurcation is patent. There is extensive artifact due to motion in the cervical internal carotid artery which is not well evaluated. Distal left cervical internal carotid  artery appears normal. Vertebral arteries: Patient moved causing significant artifact. Allowing for this, no vertebral artery abnormality identified. Skeleton: Limited evaluation due to motion. No acute skeletal lesion identified. Other neck: Negative Upper chest: Small left effusion. Mild atelectasis or infiltrate in the right upper lobe posteriorly. Review of the MIP images confirms the above findings CTA HEAD FINDINGS Anterior circulation: Atherosclerotic calcification in the cavernous carotid bilaterally causing mild stenosis. Middle cerebral arteries patent bilaterally without stenosis identified.  Anterior cerebral arteries are obscured by motion. Posterior circulation: Both vertebral arteries patent to the basilar. Basilar patent. Posterior cerebral arteries patent bilaterally. Venous sinuses: Normal enhancement Anatomic variants: None Review of the MIP images confirms the above findings CT Brain Perfusion Findings: ASPECTS: 10 CBF (<30%) Volume: 46mL Perfusion (Tmax>6.0s) volume: 49mL Mismatch Volume: 58mL Infarction Location:None IMPRESSION: 1. Images degraded by extensive motion through the carotid bifurcation and through the head. 2. No significant intracranial or extracranial stenosis. Repeat study recommended if symptoms warrant. 3. CT perfusion negative for acute infarct or ischemia. Electronically Signed   By: Franchot Gallo M.D.   On: 03/03/2020 11:46   MR BRAIN WO CONTRAST  Result Date: 03/05/2020 CLINICAL DATA:  Initial evaluation for acute encephalopathy. Status post cardiac arrest. EXAM: MRI HEAD WITHOUT CONTRAST TECHNIQUE: Multiplanar, multiecho pulse sequences of the brain and surrounding structures were obtained without intravenous contrast. COMPARISON:  Prior CTs from 03/03/2020. FINDINGS: Brain: Examination degraded by motion artifact. Generalized age-related cerebral atrophy. There is subtly increased symmetric T2/FLAIR signal abnormality seen involving the dorsal medial aspects of the thalami (series 11, image 12), nonspecific, but suspected to be related to a degree of anoxic injury given provided history. No associated mass effect or hemorrhage. No other focal parenchymal signal abnormality or evidence for significant cerebral anoxia elsewhere within the brain. No evidence for acute or subacute infarct. Gray-white matter differentiation maintained. No areas of remote cortical infarction. No evidence for acute or chronic intracranial hemorrhage. No mass lesion, midline shift or mass effect. No hydrocephalus or extra-axial fluid collection. Pituitary gland suprasellar region within  normal limits. Midline structures intact. Vascular: Major intracranial vascular flow voids are maintained. Skull and upper cervical spine: Craniocervical junction normal. Bone marrow signal intensity within normal limits. No scalp soft tissue abnormality. Sinuses/Orbits: Globes and orbital soft tissues within normal limits. Mild scattered mucosal thickening noted within the paranasal sinuses. No mastoid effusion. Inner ear structures grossly normal. Other: None. IMPRESSION: 1. 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 2. Otherwise normal brain MRI for age. No other acute intracranial abnormality. Electronically Signed   By: Jeannine Boga M.D.   On: 03/05/2020 00:14   CARDIAC CATHETERIZATION  Result Date: 02/28/2020  Prox LAD to Mid LAD lesion is 60% stenosed.  1st Diag lesion is 75% stenosed.  Mid LAD lesion is 80% stenosed.  Prox Cx lesion is 70% stenosed.  Prox Cx to Mid Cx lesion is 80% stenosed.  Prox RCA-1 lesion is 20% stenosed.  Prox RCA-2 lesion is 70% stenosed.  RV Branch lesion is 90% stenosed.  Dist RCA-1 lesion is 80% stenosed.  Dist RCA-2 lesion is 85% stenosed.  RPDA lesion is 30% stenosed.  The left ventricular ejection fraction is 45-50% by visual estimate.  LV end diastolic pressure is normal.  There is mild left ventricular systolic dysfunction.  Out of hospital witnessed VF cardiac arrest with return of ROSC after approximately 20 minutes of CPR and administration of  2 doses of epinephrine. Significant three-vessel CAD with 60% diffuse proximal LAD stenosis, long diffuse 70% diagonal stenosis and 80% LAD stenosis after the first diagonal vessel; 70 to 80% proximal diffuse circumflex stenosis before a large marginal branch; and very large dominant RCA with 70% proximal stenosis and long diffuse 80 and 85% stenoses beyond the acute margin proximal to the PDA  takeoff with mild 30% narrowing in the PDA. Mild acute LV dysfunction with focal anterolateral hypocontractility and EF estimated 45 to 50%. LVEDP 12 mm RECOMMENDATION: Suspect transient coronary vasospasm involving the LAD circulation in the etiology of the patient's VF cardiac arrest. Low-dose IV nitroglycerin was started at the end of the catheterization procedure. Patient will be transported to to heart and evaluated by critical care with consideration for hypothermia due to witnessed cardiac arrest. A 2D echo Doppler study will be obtained. We will review angios with colleagues but with diffuse multivessel CAD in this diabetic male consider possible surgical consultation for CABG revascularization following stability.   CT Code Stroke Cerebral Perfusion with contrast  Result Date: 03/03/2020 CLINICAL DATA:  Aphasia.  Rule out stroke. EXAM: CT ANGIOGRAPHY HEAD AND NECK CT PERFUSION BRAIN TECHNIQUE: Multidetector CT imaging of the head and neck was performed using the standard protocol during bolus administration of intravenous contrast. Multiplanar CT image reconstructions and MIPs were obtained to evaluate the vascular anatomy. Carotid stenosis measurements (when applicable) are obtained utilizing NASCET criteria, using the distal internal carotid diameter as the denominator. Multiphase CT imaging of the brain was performed following IV bolus contrast injection. Subsequent parametric perfusion maps were calculated using RAPID software. CONTRAST:  178mL OMNIPAQUE IOHEXOL 350 MG/ML SOLN COMPARISON:  CT head 03/03/2020 FINDINGS: FINDINGS CTA NECK FINDINGS Aortic arch: Standard branching. Imaged portion shows no evidence of aneurysm or dissection. No significant stenosis of the major arch vessel origins. Right carotid system: The patient moved during scanning through the neck and head. There is significant artifact of the right carotid bifurcation which is not well evaluated. Right internal carotid artery is  patent above the bifurcation. No definite calcification. Left carotid system: Left carotid bifurcation is patent. There is extensive artifact due to motion in the cervical internal carotid artery which is not well evaluated. Distal left cervical internal carotid artery appears normal. Vertebral arteries: Patient moved causing significant artifact. Allowing for this, no vertebral artery abnormality identified. Skeleton: Limited evaluation due to motion. No acute skeletal lesion identified. Other neck: Negative Upper chest: Small left effusion. Mild atelectasis or infiltrate in the right upper lobe posteriorly. Review of the MIP images confirms the above findings CTA HEAD FINDINGS Anterior circulation: Atherosclerotic calcification in the cavernous carotid bilaterally causing mild stenosis. Middle cerebral arteries patent bilaterally without stenosis identified. Anterior cerebral arteries are obscured by motion. Posterior circulation: Both vertebral arteries patent to the basilar. Basilar patent. Posterior cerebral arteries patent bilaterally. Venous sinuses: Normal enhancement Anatomic variants: None Review of the MIP images confirms the above findings CT Brain Perfusion Findings: ASPECTS: 10 CBF (<30%) Volume: 19mL Perfusion (Tmax>6.0s) volume: 75mL Mismatch Volume: 22mL Infarction Location:None IMPRESSION: 1. Images degraded by extensive motion through the carotid bifurcation and through the head. 2. No significant intracranial or extracranial stenosis. Repeat study recommended if symptoms warrant. 3. CT perfusion negative for acute infarct or ischemia. Electronically Signed   By: Franchot Gallo M.D.   On: 03/03/2020 11:46   DG Chest Port 1 View  Result Date: 03/04/2020 CLINICAL DATA:  Acute respiratory failure with hypoxia EXAM: PORTABLE CHEST 1 VIEW  COMPARISON:  Chest radiograph 03/03/2020 FINDINGS: With stable cardiomediastinal contours with enlarged heart size. There are persistent very mild bilateral  interstitial opacities, possibly chronic lung disease. No focal consolidation. No pneumothorax or significant pleural effusion. IMPRESSION: Stable chest with persistent mild bilateral interstitial opacities. Electronically Signed   By: Audie Pinto M.D.   On: 03/04/2020 15:26   DG Chest Port 1 View  Result Date: 03/03/2020 CLINICAL DATA:  Acute respiratory distress. EXAM: PORTABLE CHEST 1 VIEW COMPARISON:  03/01/2020 FINDINGS: Borderline enlarged cardiac silhouette with an interval decrease in size. The endotracheal tube and nasogastric tube have been removed. Mildly decreased prominence of the interstitial markings with resolved left lung airspace opacities and right lung linear densities. Unremarkable bones. IMPRESSION: 1. Resolved left lung alveolar edema and right lung atelectasis. 2. Improved probable combination of interstitial pulmonary edema and chronic interstitial lung disease. Electronically Signed   By: Claudie Revering M.D.   On: 03/03/2020 10:57   DG CHEST PORT 1 VIEW  Result Date: 03/01/2020 CLINICAL DATA:  Hypoxia EXAM: PORTABLE CHEST 1 VIEW COMPARISON:  February 29, 2020 FINDINGS: Endotracheal tube tip is 4.7 cm above the carina. Nasogastric tube tip and side port are below the diaphragm. No pneumothorax. There is atelectatic change in each lower lung region with equivocal left pleural effusion. No consolidation. Heart is upper normal in size with pulmonary vascularity normal. No adenopathy. No bone lesions. IMPRESSION: Tube positions as described without pneumothorax. Lower lung region atelectatic change with equivocal left pleural effusion. Heart upper in size. Electronically Signed   By: Lowella Grip III M.D.   On: 03/01/2020 08:51   DG CHEST PORT 1 VIEW  Result Date: 02/29/2020 CLINICAL DATA:  Code STEMI.  Aspiration EXAM: PORTABLE CHEST 1 VIEW COMPARISON:  Yesterday FINDINGS: Endotracheal tube tip at the clavicular heads. The enteric tube reaches the stomach. Low volume chest with  indistinct and streaky density on both sides. Cardiomegaly and vascular pedicle widening accentuated by low volumes. No visible effusion or air leak IMPRESSION: 1. Unremarkable hardware positioning. 2. Increased infiltrates/atelectasis in the bilateral low volume lungs. Electronically Signed   By: Monte Fantasia M.D.   On: 02/29/2020 06:49   DG Chest Portable 1 View  Result Date: 02/28/2020 CLINICAL DATA:  64 year old male intubated.  Cardiac arrest at home. EXAM: PORTABLE CHEST 1 VIEW COMPARISON:  None. FINDINGS: Portable AP supine view at 0134 hours. Endotracheal tube tip at the level the clavicles. Enteric tube courses into the left upper quadrant, tip not included. Mildly low lung volumes. Normal cardiac size and mediastinal contours. Allowing for portable technique the lungs are clear. No acute osseous abnormality identified. Paucity of bowel gas. IMPRESSION: 1. Satisfactory placement of endotracheal tube and enteric tube. 2.  No acute cardiopulmonary abnormality. Electronically Signed   By: Genevie Ann M.D.   On: 02/28/2020 01:45   EEG adult  Result Date: 03/03/2020 Lora Havens, MD     03/03/2020  1:50 PM Patient Name: Kacyn Souder MRN: [id number] Epilepsy Attending: Lora Havens Referring Physician/Provider: Dr Amie [place] Date: 7/08/25/2019 Duration: 23.08 mins Patient history: 64 year old past history of diabetes, hypertension, with no known coronary disease brought in after cardiac arrest requiring 10 minutes of CPR. Had worsening mental status today. EEG to evaluate for seizure. Level of alertness: comatose AEDs during EEG study: None Technical aspects: This EEG study was done with scalp electrodes positioned according to the 10-20 International system of electrode placement. Electrical activity was acquired at a sampling rate of 500Hz  and reviewed  with a high frequency filter of 70Hz  and a low frequency filter of 1Hz . EEG data were recorded continuously and digitally stored. Description: EEG  showed continuous generalized 5-8 Hz theta slowing as well as 2-3Hz  delta slowing. Hyperventilation and photic stimulation were not performed.   ABNORMALITY -Continuous slow, generalized IMPRESSION: This study is suggestive of severe diffuse encephalopathy, nonspecific etiology. No seizures or epileptiform discharges were seen throughout the recording. Lora Havens   EEG adult  Result Date: 02/28/2020 Lora Havens, MD     02/28/2020 10:38 AM Patient Name: Quavion Boule MRN: [id number] Epilepsy Attending: Lora Havens Referring Physician/Provider: Dr Laurelyn Sickle Date: 02/28/2020 Duration: 23.25 mins Patient history: 64yo M s/p cardiac arrest on ttm. EEg to evaluate for seizure Level of alertness: comatose AEDs during EEG study: Propofol Technical aspects: This EEG study was done with scalp electrodes positioned according to the 10-20 International system of electrode placement. Electrical activity was acquired at a sampling rate of 500Hz  and reviewed with a high frequency filter of 70Hz  and a low frequency filter of 1Hz . EEG data were recorded continuously and digitally stored. Description: EEG showed continuous generalized low amplitude 3 to 6 Hz theta-delta slowing. EEg was reactive to tactile stimulation.  Hyperventilation and photic stimulation were not performed.   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 No seizures or epileptiform discharges were seen throughout the recording. Priyanka [name]   Overnight EEG with video  Result Date: 02/29/2020 Lora Havens, MD     03/01/2020 10:02 AM Patient Name: Earon Rivest MRN: [id number] Epilepsy Attending: Lora Havens Referring Physician/Provider: Dr Laurelyn Sickle Duration: 02/28/2020 1012 to 02/29/2020 1012 Patient history: 64yo M s/p cardiac arrest on ttm. EEg to evaluate for seizure   Level of alertness: comatose  AEDs during EEG study: Propofol  Technical aspects: This EEG study was done with scalp electrodes positioned according to the 10-20 International system of electrode placement. Electrical activity was acquired at a sampling rate of 500Hz  and reviewed with a high frequency filter of 70Hz  and a low frequency filter of 1Hz . EEG data were recorded continuously and digitally stored.  Description: EEG showed continuous generalized low amplitude 3 to 6 Hz theta-delta slowing. EEg was reactive to tactile stimulation.  Hyperventilation and photic stimulation were not performed.    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 No seizures or epileptiform discharges were seen throughout the recording.   Lora Havens   ECHOCARDIOGRAM COMPLETE  Result Date: 02/28/2020    ECHOCARDIOGRAM REPORT   Patient Name:   Richard Hatfield Date of Exam: 02/28/2020 Medical Rec #:  [id number]     Height:       68.0 in Accession #:    [id number]    Weight:       211.0 lb Date of Birth:  03-Jun-1956     BSA:          2.091 m Patient Age:    58 years      BP:           127/67 mmHg Patient Gender: M             HR:           64 bpm. Exam Location:  Inpatient Procedure: 2D Echo, Cardiac Doppler and Color Doppler Indications:  Cardiac arrest  History:        Patient has no prior history of Echocardiogram examinations.                 Acute MI, Arrythmias:Cardiac Arrest; Risk Factors:Hypertension                 and Diabetes.  Sonographer:    Dustin Flock Referring Phys: Luther Comments: Echo performed with patient supine and on artificial respirator. Image acquisition challenging due to uncooperative patient. IMPRESSIONS  1. Normal wall motion in visualized segments. Mid to distal anterolateral wall not well visualized on apical  images. Consider repeat with echo contrast for wall motion when patient less agitated.. Left ventricular ejection fraction, by estimation, is 50  to 55%. The left ventricle has low normal function. The left ventricle has no regional wall motion abnormalities. There is mild concentric left ventricular hypertrophy. Left ventricular diastolic parameters were normal.  2. Right ventricular systolic function is normal. The right ventricular size is normal. There is mildly elevated pulmonary artery systolic pressure.  3. Left atrial size was mildly dilated.  4. The mitral valve is normal in structure. Trivial mitral valve regurgitation. No evidence of mitral stenosis.  5. The aortic valve has an indeterminant number of cusps. Aortic valve regurgitation is not visualized. Mild aortic valve stenosis. Comparison(s): No prior Echocardiogram. FINDINGS  Left Ventricle: Normal wall motion in visualized segments. Mid to distal anterolateral wall not well visualized on apical images. Consider repeat with echo contrast for wall motion when patient less agitated. Left ventricular ejection fraction, by estimation, is 50 to 55%. The left ventricle has low normal function. The left ventricle has no regional wall motion abnormalities. The left ventricular internal cavity size was normal in size. There is mild concentric left ventricular hypertrophy. Left ventricular diastolic parameters were normal. Right Ventricle: The right ventricular size is normal. No increase in right ventricular wall thickness. Right ventricular systolic function is normal. There is mildly elevated pulmonary artery systolic pressure. The tricuspid regurgitant velocity is 2.94  m/s, and with an assumed right atrial pressure of 8 mmHg, the estimated right ventricular systolic pressure is 22.2 mmHg. Left Atrium: Left atrial size was mildly dilated. Right Atrium: Right atrial size was normal in size. Pericardium: There is no evidence of pericardial effusion. Presence  of pericardial fat pad. Mitral Valve: The mitral valve is normal in structure. Trivial mitral valve regurgitation. No evidence of mitral valve stenosis. Tricuspid Valve: The tricuspid valve is normal in structure. Tricuspid valve regurgitation is trivial. No evidence of tricuspid stenosis. Aortic Valve: The aortic valve has an indeterminant number of cusps. Aortic valve regurgitation is not visualized. Mild aortic stenosis is present. There is moderate calcification of the aortic valve. Aortic valve mean gradient measures 8.0 mmHg. Aortic valve peak gradient measures 19.9 mmHg. Aortic valve area, by VTI measures 1.33 cm. Pulmonic Valve: The pulmonic valve was not well visualized. Pulmonic valve regurgitation is trivial. No evidence of pulmonic stenosis. Aorta: The aortic root is normal in size and structure. Venous: The inferior vena cava was not well visualized. IAS/Shunts: The atrial septum is grossly normal.  LEFT VENTRICLE PLAX 2D LVIDd:         4.30 cm  Diastology LVIDs:         3.10 cm  LV e' lateral:   8.81 cm/s LV PW:         1.40 cm  LV E/e' lateral: 8.8 LV IVS:  1.50 cm  LV e' medial:    7.83 cm/s LVOT diam:     2.00 cm  LV E/e' medial:  9.9 LV SV:         51 LV SV Index:   24 LVOT Area:     3.14 cm  RIGHT VENTRICLE RV Basal diam:  3.30 cm RV S prime:     12.90 cm/s TAPSE (M-mode): 3.9 cm LEFT ATRIUM             Index       RIGHT ATRIUM           Index LA diam:        4.10 cm 1.96 cm/m  RA Area:     14.80 cm LA Vol (A2C):   63.6 ml 30.42 ml/m RA Volume:   34.90 ml  16.69 ml/m LA Vol (A4C):   36.4 ml 17.41 ml/m LA Biplane Vol: 49.9 ml 23.87 ml/m  AORTIC VALVE AV Area (Vmax):    1.27 cm AV Area (Vmean):   1.26 cm AV Area (VTI):     1.33 cm AV Vmax:           223.00 cm/s AV Vmean:          133.000 cm/s AV VTI:            0.380 m AV Peak Grad:      19.9 mmHg AV Mean Grad:      8.0 mmHg LVOT Vmax:         90.00 cm/s LVOT Vmean:        53.200 cm/s LVOT VTI:          0.161 m LVOT/AV VTI ratio:  0.42  AORTA Ao Root diam: 3.30 cm MITRAL VALVE               TRICUSPID VALVE MV Area (PHT): 4.19 cm    TR Peak grad:   34.6 mmHg MV Decel Time: 181 msec    TR Vmax:        294.00 cm/s MV E velocity: 77.60 cm/s MV A velocity: 68.60 cm/s  SHUNTS MV E/A ratio:  1.13        Systemic VTI:  0.16 m                            Systemic Diam: 2.00 cm Buford Dresser MD Electronically signed by Buford Dresser MD Signature Date/Time: 02/28/2020/10:28:34 PM    Final     Microbiology: Recent Results (from the past 240 hour(s))  SARS Coronavirus 2 by RT PCR (hospital order, performed in [place] hospital lab) Nasopharyngeal Nasopharyngeal Swab     Status: None   Collection Time: 02/28/20  1:33 AM   Specimen: Nasopharyngeal Swab  Result Value Ref Range Status   SARS Coronavirus 2 NEGATIVE NEGATIVE Final    Comment: (NOTE) SARS-CoV-2 target nucleic acids are NOT DETECTED.  The SARS-CoV-2 RNA is generally detectable in upper and lower respiratory specimens during the acute phase of infection. The lowest concentration of SARS-CoV-2 viral copies this assay can detect is 250 copies / mL. A negative result does not preclude SARS-CoV-2 infection and should not be used as the sole basis for treatment or other patient management decisions.  A negative result may occur with improper specimen collection / handling, submission of specimen other than nasopharyngeal swab, presence of viral mutation(s) within the areas targeted by this assay, and inadequate number of viral copies (<250 copies /  mL). A negative result must be combined with clinical observations, patient history, and epidemiological information.  Fact Sheet for Patients:   StrictlyIdeas.no  Fact Sheet for Healthcare Providers: BankingDealers.co.za  This test is not yet approved or  cleared by the [place] FDA and has been authorized for detection and/or diagnosis of SARS-CoV-2 by FDA  under an Emergency Use Authorization (EUA).  This EUA will remain in effect (meaning this test can be used) for the duration of the COVID-19 declaration under Section 564(b)(1) of the Act, 21 U.S.C. section 360bbb-3(b)(1), unless the authorization is terminated or revoked sooner.  Performed at [facility], [place] [address] [place], [place] [address]   MRSA PCR Screening     Status: None   Collection Time: 02/28/20  4:08 AM   Specimen: Nasopharyngeal  Result Value Ref Range Status   MRSA by PCR NEGATIVE NEGATIVE Final    Comment:        The GeneXpert MRSA Assay (FDA approved for NASAL specimens only), is one component of a comprehensive MRSA colonization surveillance program. It is not intended to diagnose MRSA infection nor to guide or monitor treatment for MRSA infections. Performed at [facility], [place] [address] [place],  [address]      Labs: Basic Metabolic Panel: Recent Labs  Lab 03/02/20 0445 03/02/20 0445 03/03/20 0930 03/04/20 0243 03/05/20 0215 03/06/20 0519 03/08/20 0931  NA 147*   < > 143 147* 148* 142 136  K 3.5   < > 3.5 4.7 4.0 4.1 4.4  CL 103   < > 103 109 111 107 104  CO2 29   < > 23 23 23 24 23   GLUCOSE 109*   < > 113* 134* 153* 183* 290*  BUN 25*   < > 28* 35* 34* 24* 23  CREATININE 1.01   < > 1.02 0.99 0.88 0.93 0.85  CALCIUM 9.7   < > 9.9 9.9 10.1 9.7 8.9  MG 2.3  --  2.2 2.6*  --   --   --   PHOS 2.4*  --  3.4  --   --   --   --    < > = values in this interval not displayed.   Liver Function Tests: Recent Labs  Lab 03/06/20 0519 03/08/20 0931  AST 51* 57*  ALT 44 61*  ALKPHOS 36* 47  BILITOT 1.3* 0.5  PROT 6.5 6.7  ALBUMIN 2.9* 3.1*   No results for input(s): LIPASE, AMYLASE in the last 168 hours. Recent Labs  Lab 03/03/20 1146 03/04/20 0915  AMMONIA 46* 31   CBC: Recent Labs  Lab 03/03/20 0930 03/04/20 0243 03/05/20 0215 03/06/20 0519 03/07/20 0612  WBC 9.2 8.8 8.8 9.6 10.4  HGB 14.3 14.9 15.4  14.4 14.8  HCT 42.8 43.7 47.7 43.6 43.6  MCV 95.7 95.8 99.4 98.2 96.0  PLT 225 169 237 224 237   Cardiac Enzymes: No results for input(s): CKTOTAL, CKMB, CKMBINDEX, TROPONINI in the last 168 hours. BNP: BNP (last 3 results) No results for input(s): BNP in the last 8760 hours.  ProBNP (last 3 results) No results for input(s): PROBNP in the last 8760 hours.  CBG: Recent Labs  Lab 03/07/20 1658 03/07/20 2104 03/08/20 0601 03/08/20 0733 03/08/20 1126  GLUCAP 207* 199* 191* 178* 234*       Signed:  Alma Friendly, MD [facility] 03/08/2020, 12:34 PM

## 2020-03-08 NOTE — Progress Notes (Addendum)
Inpatient Diabetes Program Recommendations  AACE/ADA: New Consensus Statement on Inpatient Glycemic Control (2015)  Target Ranges:  Prepandial:   less than 140 mg/dL      Peak postprandial:   less than 180 mg/dL (1-2 hours)      Critically ill patients:  140 - 180 mg/dL   Lab Results  Component Value Date   GLUCAP 178 (H) 03/08/2020   HGBA1C 8.2 (H) 02/28/2020    Review of Glycemic Control Results for Richard Hatfield, Richard Hatfield (MRN [id number]) as of 03/08/2020 10:18  Ref. Range 03/07/2020 07:25 03/07/2020 11:34 03/07/2020 16:58 03/07/2020 21:04 03/08/2020 06:01 03/08/2020 07:33  Glucose-Capillary Latest Ref Range: 70 - 99 mg/dL 153 (H) 294 (H) 207 (H) 199 (H) 191 (H) 178 (H)   Diabetes history: DM 2 Outpatient Diabetes medications: Tresiba 90 units daily, Novolog 8 units tid Jardince 25 mg Daily, metformin 1000 mg qam, 500 mg qpm Current orders for Inpatient glycemic control:  Novolog 0-15 units tid + hs  Glucerna bid between meals  Inpatient Diabetes Program Recommendations:    Glucose trends increase after meal intake and after supplements.   -  Consider Novolog 3 units tid meal coverage if eating > 50% of meals  Thanks,   Tama Headings RN, MSN, BC-ADM Inpatient Diabetes Coordinator Team Pager [phone] (8a-5p)

## 2020-03-08 NOTE — Progress Notes (Signed)
Patient arrived on floor stable and in low bed.

## 2020-03-08 NOTE — Progress Notes (Signed)
Cristina Gong, RN  Rehab Admission Coordinator  Physical Medicine and Rehabilitation  PMR Pre-admission      Signed  Date of Service:  03/07/2020 11:54 AM      Related encounter: ED to Hosp-Admission (Discharged) from 02/28/2020 in [facility]      Signed       Show:Clear all [x] Manual[x] Template[x] Copied  Added by: [x] Julious Payer Vertis Kelch, RN  [] Hover for details PMR Admission Coordinator Pre-Admission Assessment   Patient: Richard Hatfield is an 64 y.o., male MRN: [id number] DOB: Jul 07, 1956 Height: 5\' 8"  (172.7 cm) Weight: 86.6 kg                                                                                                                                                  Insurance Information HMO:     PPO: yes     PCP:      IPA:      80/20:      OTHER:  PRIMARY: [place] [employer] [place]      Policy#: [id number]      Subscriber: pt CM Name: Levy Pupa      Phone#: [phone]     Fax#: [phone] Pre-Cert#: [id number] approved until 8.3 when updates are due    Employer: department of transportation Benefits:  Phone #: [phone]     Name: 7/21 Eff. Date: 08/19/2019     Deduct: $1250      Out of Pocket Max: $4890      Life Max: none  CIR: $300 co py per admit then covered at 80%      SNF: 80% 100 days per year Outpatient: $26 to $52 co pay per visit     Co-Pay: visits limited by medical necessity Home Health: 80%      Co-Pay: visits per medical neccesity DME: 80%     Co-Pay: 20% Providers: in network  SECONDARY: none      Policy#:       Phone#:    Development worker, community:       Phone#:    The Engineer, petroleum" for patients in Inpatient Rehabilitation Facilities with attached "Privacy Act [place] Records" was provided and verbally reviewed with: N/A   Emergency Contact Information         Contact Information     Name Relation Home Work [place], [place] Spouse     [phone]       Current Medical History    Patient Admitting Diagnosis: Hypoxic/ischemic encephalopathy following cardiac arrest   History of Present Illness:  64 y.o. male with history of HTN, T2DM, ETOH abuse; who was admitted 02/28/20 after cardiac arrest. Per reports, Wife woke up husband having jerking movements and difficulty breathing,activated EMS, performed CPR X 8 minutes followed by 12 minutes of CPR/ACLS for VT by EMS with ROSC. Patient  mildly responsive to sternal rubs at admission and intubated in ED. UDS negative. EKG with mild ST changes and he was taken to cath lab and was found to have diffuse CAD with EF 45-50% and no acute thrombus.  VF arrest felt to be due to transient LAD coronary spasm superimposed on significant multivessel CAD and CVTS consult recommended for input. He continued to have decrease in LOC with sluggish movements and EEG showed severe diffuse encephalopathy felt to be due to sedation or anoxic BI and was negative for seizures.    He was extubated without difficulty on 07/15 and was noted to have confusion with bouts of agitation question ETOH withdrawal v/s delirium.  He continues on IV heparin as well as IV lopressor due to NSVT. He received IV ativan on am of 07/17 with obtundation, hypoxia with bouts of apnea and sluggish pupils therefore code stroke activated and he was transferred to ICU.  Neurology consulted for input and questioned stroke as cause of MS changes. CT  Head was negative for bleed or acute changes. CTA head/neck and cerebral perfusion study done and was negative for infarct or ischemia. MRI done revealing T2 flare felt to be due to anoxic injury.To continue DAPT with Asa and Plavix. As well as betablocker and high intensity statin.  Bedside swallow showed signs of dysphagia and full liquid diet recommended. He was also noted to be severely dysarthric with rapid rate of speech, verbose, cognitive deficits and weakness. He has undergone improvements over the last few days prior to discharge to  CIR.   CVTS, Dr. Orvan Seen consulted. CABG not planned yet. Would prefer CIR admit to assist with neurological recovery prior to consideration for CABG. Plavix would have to be stopped prior to CABG.    Nonsustained VT with occasional PVC's/couplets noted on telemetry. Rates 50's to low 60's. Lopressor meds adjusted. Given presentation as nonsustained VT and unrevascularized multivessel CAD, LifeVest placed prior to discharge to CIR.       Past Medical History      Past Medical History:  Diagnosis Date  . Cardiac arrest ([place]) 02/28/2020  . Diabetes mellitus without complication ([place])    . Hypertension        Family History  family history is not on file.   Prior Rehab/Hospitalizations:  Has the patient had prior rehab or hospitalizations prior to admission? Yes   Has the patient had major surgery during 100 days prior to admission? Yes   Current Medications    Current Facility-Administered Medications:  .  0.9 %  sodium chloride infusion, , Intravenous, Continuous, Delora Fuel, MD, Last Rate: 10 mL/hr at 02/28/20 0144, New Bag at 02/28/20 0144 .  0.9 %  sodium chloride infusion, 250 mL, Intravenous, PRN, Troy Sine, MD, Stopped at 03/05/20 1540 .  acetaminophen (TYLENOL) 160 MG/5ML solution 650 mg, 650 mg, Per Tube, Q4H PRN, Shellia Cleverly, MD, 650 mg at 03/01/20 0814 .  alum & mag hydroxide-simeth (MAALOX/MYLANTA) 200-200-20 MG/5ML suspension 30 mL, 30 mL, Oral, Q6H PRN, Shalhoub, Sherryll Burger, MD, 30 mL at 03/06/20 2131 .  amLODipine (NORVASC) tablet 5 mg, 5 mg, Oral, Daily, Leonie Man, MD, 5 mg at 03/07/20 1017 .  aspirin chewable tablet 81 mg, 81 mg, Oral, Daily, Candee Furbish, MD, 81 mg at 03/07/20 1017 .  Chlorhexidine Gluconate Cloth 2 % PADS 6 each, 6 each, Topical, Daily, Candee Furbish, MD, 6 each at 03/05/20 1359 .  clopidogrel (PLAVIX) tablet 75 mg, 75 mg,  Oral, Daily, Leonie Man, MD, 75 mg at 03/07/20 1016 .  diphenhydrAMINE (BENADRYL) injection 25 mg,  25 mg, Intravenous, QHS PRN, Bowser, Grace E, NP .  feeding supplement (GLUCERNA SHAKE) (GLUCERNA SHAKE) liquid 237 mL, 237 mL, Oral, BID BM, Agarwala, Ravi, MD, 237 mL at 03/07/20 1018 .  hydrALAZINE (APRESOLINE) injection 2-5 mg, 2-5 mg, Intravenous, Q4H PRN, Leonie Man, MD .  insulin aspart (novoLOG) injection 0-15 Units, 0-15 Units, Subcutaneous, TID WC, Agarwala, Ravi, MD, 8 Units at 03/07/20 1140 .  insulin aspart (novoLOG) injection 0-5 Units, 0-5 Units, Subcutaneous, QHS, Agarwala, Ravi, MD .  irbesartan (AVAPRO) tablet 37.5 mg, 37.5 mg, Oral, Daily, Sarajane Jews, Callie E, PA-C, 37.5 mg at 03/07/20 1017 .  MEDLINE mouth rinse, 15 mL, Mouth Rinse, BID, Candee Furbish, MD, 15 mL at 03/07/20 1018 .  metoprolol tartrate (LOPRESSOR) injection 2.5 mg, 2.5 mg, Intravenous, Q4H PRN, Leonie Man, MD .  metoprolol tartrate (LOPRESSOR) tablet 50 mg, 50 mg, Oral, BID, Einar Grad, RPH, 50 mg at 03/07/20 1016 .  ondansetron (ZOFRAN) injection 4 mg, 4 mg, Intravenous, Q6H PRN, Troy Sine, MD .  polyethylene glycol (MIRALAX / GLYCOLAX) packet 17 g, 17 g, Oral, Daily, Collene Gobble, MD, 17 g at 03/06/20 0907 .  Resource [employer], , Oral, PRN, Brand Males, MD .  rosuvastatin (CRESTOR) tablet 40 mg, 40 mg, Oral, Daily, Leonie Man, MD, 40 mg at 03/07/20 1016 .  senna-docusate (Senokot-S) tablet 1 tablet, 1 tablet, Oral, BID, Collene Gobble, MD, 1 tablet at 03/06/20 0028 .  sodium chloride flush (NS) 0.9 % injection 3 mL, 3 mL, Intravenous, Q12H, Troy Sine, MD, 3 mL at 03/07/20 1019 .  sodium chloride flush (NS) 0.9 % injection 3 mL, 3 mL, Intravenous, PRN, Troy Sine, MD   Patients Current Diet:     Diet Order                      DIET SOFT Room service appropriate? 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  Independent   Indoor Mobility: Did the patient need assistance with walking from room to room (with or without device)? Independent   Stairs: Did the patient need assistance with internal or external stairs (with or without device)? Independent   Functional Cognition: Did the patient need help planning regular tasks such as shopping or remembering to take medications? Independent   Home Assistive Devices / Equipment Home Assistive Devices/Equipment: None Home Equipment: None   Prior Device Use: Indicate devices/aids used by the patient prior to current illness, exacerbation or injury? None of the above   Current Functional Level Cognition   Arousal/Alertness: Awake/alert Overall Cognitive Status: Impaired/Different from baseline Current Attention Level: Focused Orientation Level: Oriented X4 Following Commands: Follows  one step commands inconsistently, Follows one step commands with increased time Safety/Judgement: Decreased awareness of safety, Decreased awareness of deficits General Comments: Pt lethargic with brief periods of arousal. Waxing and waning multiple times throughout session.  Attention: Focused, Sustained Focused Attention: Appears intact Sustained Attention: Impaired Sustained Attention Impairment: Verbal complex Memory: Impaired Memory Impairment: Retrieval deficit, Decreased recall of new information (Immediate: 5/5; delayed: 1/5; with cues: 2/4) Awareness: Impaired Awareness Impairment: Intellectual  impairment Problem Solving: Impaired Problem Solving Impairment: Verbal complex Executive Function: Reasoning, Sequencing, Organizing Sequencing: Impaired Sequencing Impairment: Verbal complex (Clock drawing: 2/4) Organizing: Appears intact (Backward digit span: 3/3)    Extremity Assessment (includes Sensation/Coordination)   Upper Extremity Assessment: RUE deficits/detail, LUE deficits/detail RUE Deficits / Details: Due to his waxing and waning lethargy it was hard to tell, but just from obsevation he was moving LUE more than RUE--but both were not [facility] RUE Coordination: decreased fine motor, decreased gross motor  Lower Extremity Assessment: Generalized weakness     ADLs   Overall ADL's : Needs assistance/impaired General ADL Comments: Total A     Mobility   Overal bed mobility: Needs Assistance Bed Mobility: Supine to Sit, Sit to Supine Supine to sit: +2 for physical assistance, Total assist Sit to supine: +2 for physical assistance, Total assist General bed mobility comments: Assist for all aspects.      Transfers         Ambulation / Gait / Stairs / Proofreader / Balance Dynamic Sitting Balance Sitting balance - Comments: Pt sat EOB x 8-10 minutes with max assist. No balance reactions. When allowed to go posteriorly he would eventually open eyes.  Balance Overall balance assessment: Needs assistance Sitting-balance support: Feet supported, Bilateral upper extremity supported Sitting balance-Leahy Scale: Zero Sitting balance - Comments: Pt sat EOB x 8-10 minutes with max assist. No balance reactions. When allowed to go posteriorly he would eventually open eyes.      Special needs/care consideration Life Vest placement on 7/21 Designated visitor is wife, Herma Ard fall 7/22 prior to discharge to CIR; High risk to fall      Previous Home Environment  Living Arrangements: Spouse/significant other  Lives With: Spouse Available Help at  Discharge: Family, Available 24 hours/day (wife would have to take FMLA) Type of Home: House Home Layout: Two level, Able to live on main level with bedroom/bathroom Home Access: Stairs to enter Entrance Stairs-Number of Steps: 1 Bathroom Shower/Tub: Tub/shower unit, Architectural technologist: Standard Bathroom Accessibility: Yes How Accessible: Accessible via walker [place]: No   Discharge Living Setting Plans for Discharge Living Setting: Patient's home, Lives with (comment) (wife) Type of Home at Discharge: House Discharge Home Layout: Two level, Able to live on main level with bedroom/bathroom Discharge Home Access: Stairs to enter Entrance Stairs-Rails: None Entrance Stairs-Number of Steps: 1 Discharge Bathroom Shower/Tub: Tub/shower unit, Curtain Discharge Bathroom Toilet: Standard Discharge Bathroom Accessibility: Yes How Accessible: Accessible via walker Does the patient have any problems obtaining your medications?: No   Social/Family/Support Systems Patient Roles: Spouse (employee) Contact Information: wife, [name] Anticipated Caregiver: wife Anticipated Ambulance person Information: see above Ability/Limitations of Caregiver: wife is a Pharmacist, hospital and will have to take FMLA Caregiver Availability: 24/7 Discharge Plan Discussed with Primary Caregiver: Yes Is Caregiver In Agreement with Plan?: Yes Does Caregiver/Family have Issues with Lodging/Transportation while Pt is in Rehab?: No   Goals Patient/Family Goal for Rehab: supervision PT, supervision to min OT, supervision  SLP Expected length of stay: ELOS 21 to 25 days Pt/Family Agrees to Admission and willing to participate: Yes Program Orientation Provided & Reviewed with Pt/Caregiver Including Roles  & Responsibilities: Yes   Decrease burden of Care through IP rehab admission: n/a   Possible need for SNF placement upon discharge:not anticipated   Patient Condition: This patient's medical and functional  status has changed since the consult dated: 03/05/2020 in which the Rehabilitation Physician determined and documented that the patient's condition is appropriate for intensive rehabilitative care in an inpatient rehabilitation facility. See "History of Present Illness" (above) for medical update. Functional changes are: mod assist with mod to max verbal cues due to cognitive deficits. Patient's medical and functional status update has been discussed with the Rehabilitation physician and patient remains appropriate for inpatient rehabilitation. Will admit to inpatient rehab today.   Preadmission Screen Completed By:  Cleatrice Burke, RN, 03/07/2020 11:54 AM ______________________________________________________________________   Discussed status with Dr. Dagoberto Ligas at  1048 and received approval for admission today.   Admission Coordinator:  Cleatrice Burke, time 1884 Date 03/08/2020             Cosigned by: Courtney Heys, MD at 03/08/2020 10:51 AM  Revision History                Note Details  Author Cristina Gong, RN File Time 03/08/2020 10:48 AM  Author Type Rehab Admission Coordinator Status Signed  Last Editor Cristina Gong, RN Service Physical Medicine and [place] # [id number] Admit Date 03/08/2020

## 2020-03-08 NOTE — Progress Notes (Signed)
Progress Note  Patient Name: Richard Hatfield Date of Encounter: 03/08/2020  [facility] Cardiologist: Shelva Majestic, MD   Subjective   Patient found on the floor overnight. Given some baseline confusion, head CT was ordered and showed no acute findings. Patient notes some chest soreness if he tries to pull himself up in bed using handrail but no other chest pain. No shortness of breath.  Inpatient Medications    Scheduled Meds: . amLODipine  5 mg Oral Daily  . aspirin  81 mg Oral Daily  . Chlorhexidine Gluconate Cloth  6 each Topical Daily  . clopidogrel  75 mg Oral Daily  . feeding supplement (GLUCERNA SHAKE)  237 mL Oral BID BM  . insulin aspart  0-15 Units Subcutaneous TID WC  . insulin aspart  0-5 Units Subcutaneous QHS  . irbesartan  37.5 mg Oral Daily  . mouth rinse  15 mL Mouth Rinse BID  . metoprolol tartrate  50 mg Oral BID  . polyethylene glycol  17 g Oral Daily  . rosuvastatin  40 mg Oral Daily  . senna-docusate  1 tablet Oral BID  . sodium chloride flush  3 mL Intravenous Q12H   Continuous Infusions: . sodium chloride 10 mL/hr at 02/28/20 0144  . sodium chloride Stopped (03/05/20 1540)   PRN Meds: sodium chloride, acetaminophen (TYLENOL) oral liquid 160 mg/5 mL, alum & mag hydroxide-simeth, diphenhydrAMINE, hydrALAZINE, metoprolol tartrate, ondansetron (ZOFRAN) IV, Resource ThickenUp Clear, sodium chloride flush   Vital Signs    Vitals:   03/07/20 2317 03/08/20 0122 03/08/20 0531 03/08/20 0811  BP: (!) 142/80 131/81 119/80 135/73  Pulse: (!) 56 (!) 52 (!) 57   Resp: 16 20 13 16   Temp: 98.6 F (37 C) 98.9 F (37.2 C) 97.6 F (36.4 C)   TempSrc: Oral Oral Oral   SpO2: 100% 96% 97% 100%  Weight:   86.6 kg   Height:        Intake/Output Summary (Last 24 hours) at 03/08/2020 0846 Last data filed at 03/08/2020 0800 Gross per 24 hour  Intake 222 ml  Output 3400 ml  Net -3178 ml   Last 3 Weights 03/08/2020 03/07/2020 03/06/2020  Weight (lbs) 191 lb 190  lb 14.7 oz 190 lb 11.2 oz  Weight (kg) 86.637 kg 86.6 kg 86.5 kg      Telemetry    Sinus rhythm with rates in the 50's to 60's. Occasional PVC/couplets. - Personally Reviewed  ECG    No new ECG tracing today. - Personally Reviewed  Physical Exam   GEN: No acute distress.   Neck: No JVD. Cardiac: RRR. II-III/VI systolic murmur best heard at upper sternal border. No rubs or gallops.  Respiratory: Clear to auscultation bilaterally. GI: Soft, non-distended, and non-tender.  MS: No lower extremity edema. No deformity. Neuro:  No focal deficits. Still confused but improved. Alert to name, birthday, place (city and name of hospital). Still cannot tell me exactly what brought him in. Psych: Normal affect.   Labs    High Sensitivity Troponin:   Recent Labs  Lab 02/28/20 0126 02/28/20 0543  TROPONINIHS 60* 992*      Chemistry Recent Labs  Lab 03/04/20 0243 03/05/20 0215 03/06/20 0519  NA 147* 148* 142  K 4.7 4.0 4.1  CL 109 111 107  CO2 23 23 24   GLUCOSE 134* 153* 183*  BUN 35* 34* 24*  CREATININE 0.99 0.88 0.93  CALCIUM 9.9 10.1 9.7  PROT  --   --  6.5  ALBUMIN  --   --  2.9*  AST  --   --  51*  ALT  --   --  44  ALKPHOS  --   --  36*  BILITOT  --   --  1.3*  GFRNONAA >60 >60 >60  GFRAA >60 >60 >60  ANIONGAP 15 14 11      Hematology Recent Labs  Lab 03/05/20 0215 03/06/20 0519 03/07/20 0612  WBC 8.8 9.6 10.4  RBC 4.80 4.44 4.54  HGB 15.4 14.4 14.8  HCT 47.7 43.6 43.6  MCV 99.4 98.2 96.0  MCH 32.1 32.4 32.6  MCHC 32.3 33.0 33.9  RDW 11.9 11.5 11.3*  PLT 237 224 237    BNPNo results for input(s): BNP, PROBNP in the last 168 hours.   DDimer No results for input(s): DDIMER in the last 168 hours.   Radiology    CT HEAD WO CONTRAST  Result Date: 03/08/2020 CLINICAL DATA:  Traumatic brain injury, change in neurologic status, history of anoxic brain injury EXAM: CT HEAD WITHOUT CONTRAST TECHNIQUE: Contiguous axial images were obtained from the base of  the skull through the vertex without intravenous contrast. COMPARISON:  03/04/2020, 03/03/2020 FINDINGS: Brain: No acute infarct or hemorrhage. Lateral ventricles and midline structures are stable. No acute extra-axial fluid collections. No mass effect. Vascular: No hyperdense vessel or unexpected calcification. Skull: Normal. Negative for fracture or focal lesion. Sinuses/Orbits: Minimal mucosal thickening right maxillary sinus. Remaining sinuses are clear. Other: None. IMPRESSION: No acute intracranial process. Electronically Signed   By: Randa Ngo M.D.   On: 03/08/2020 00:37    Cardiac Studies   Left Heart Catheterization 02/28/2020:  Prox LAD to Mid LAD lesion is 60% stenosed.  1st Diag lesion is 75% stenosed.  Mid LAD lesion is 80% stenosed.  Prox Cx lesion is 70% stenosed. Prox Cx to Mid Cx lesion is 80% stenosed.  Prox RCA-1 lesion is 20% stenosed.Prox RCA-2 lesion is 70% stenosed.  RV Branch lesion is 90% stenosed.  Dist RCA-1 lesion is 80% stenosed. Dist RCA-2 lesion is 85% stenosed.  RPDA lesion is 30% stenosed.  The left ventricular ejection fraction is 45-50% by visual estimate.  LV end diastolic pressure is normal.  There is mild left ventricular systolic dysfunction.  Out of hospital witnessed VF cardiac arrest with return of ROSC after approximately 20 minutes of CPR and administration of 2 doses of epinephrine.  Significant three-vessel CAD with 60% diffuse proximal LAD stenosis, long diffuse 70% diagonal stenosis and 80% LAD stenosis after the first diagonal vessel; 70 to 80% proximal diffuse circumflex stenosis before a large marginal branch; and very large dominant RCA with 70% proximal stenosis and long diffuse 80 and 85% stenoses beyond the acute margin proximal to the PDA takeoff with mild 30% narrowing in the PDA.  Mild acute LV dysfunction with focal anterolateral hypocontractility and EF estimated 45 to 50%. LVEDP 12 mm  Recommendation: Suspect  transient coronary vasospasm involving the LAD circulation in the etiology of the patient's VF cardiac arrest. Low-dose IV nitroglycerin was started at the end of the catheterization procedure. Patient will be transported to to heart and evaluated by critical care with consideration for hypothermia due to witnessed cardiac arrest. A 2D echo Doppler study will be obtained. We will review angios with colleagues but with diffuse multivessel CAD in this diabetic male consider possible surgical consultation for CABG revascularization following stability. _______________  Echocardiogram 02/28/2020: Impressions: 1. Normal wall motion in visualized segments. Mid to distal anterolateral wall not well visualized on apical images. Consider  repeat with echo contrast for wall motion when patient less agitated.. Left ventricular ejection fraction, by estimation, is 50 to 55%. The left ventricle has low normal function. The left ventricle has no regional wall motion abnormalities. There is mild concentric left ventricular hypertrophy. Left ventricular diastolic parameters were normal.  2. Right ventricular systolic function is normal. The right ventricular size is normal. There is mildly elevated pulmonary artery systolic pressure.  3. Left atrial size was mildly dilated.  4. The mitral valve is normal in structure. Trivial mitral valve regurgitation. No evidence of mitral stenosis.  5. The aortic valve has an indeterminant number of cusps. Aortic valve regurgitation is not visualized. Mild aortic valve stenosis.   Patient Profile     64 y.o. male with a history of hypertension and diabetes who presented with out of hospital cardiac arrest on 02/28/2020.  Assessment & Plan    Cardiac Arrest with Severe 3-Vessel CAD - Patient presented with out of hospital witnessed VF cardiac arrest with return of ROSC after about 20 minutes of CPR and 2 doses of Epi.  - High-sensitivity troponin 60 >> 992.  - Cath on 7/13  showed severe 3-vessel CAD involving LAD, RCA, and LCX.  - Echo showed LVEF of 50-55% with normal wall motion in visualized segments (but mid to distal anterolateral wall not well visualized).  - Continue DAPT with Aspirin and Plavix. Continue beta-blocker and high-intensity statin. - He was initially very encephalopathic but he has had some great improvement over the last couple of days. Still not back to baseline though. Dr. Ellyn Hack was going to talk to CVTS about timing of forma consult for consideration of CABG. Plavix would need to be stopped 5-7 days prior to CABG.  Of note, RN informed be that lab was not able to run P2Y12 assay because machine was down.   Non-Sustained VT - Occasional PVCs/couplets noted on telemetry. Rates in the 50's to low 60's.  - Will hold morning dose of Lopressor and reduced to 25mg  twice daily with hold parameters.  - Given presentation as non-sustained VT and unrevascularized multivessel CAD, LifeVest will be placed prior to discharge. It has already been delivered to patient's room.  Hypertension - BP improved after restarting ARB. - Continue Irbesartan 37.5mg  (since we do not have Valsartan on formulary). - Will decrease Lopressor to 25mg  twice daily.  Encephalopathy - Felt to be hypoxic/metabolic in nature. - Canagement per primary team and Neuro. - Plan is for CIR once bed is available.  Aspiration Pneumonia - Completed 5 days of Ceftriaxone. - Afebrile and WBC normal. - Management per primary team.  For questions or updates, please contact [place] Please consult [email or web address] for contact info under        Signed, Darreld Mclean, PA-C  03/08/2020, 8:46 AM

## 2020-03-08 NOTE — Consult Note (Signed)
[address]       [place] [address]             [phone]        Mandell Boyte Gila Medical Record #[id number] Date of Birth: March 05, 1956  Referring: No ref. provider found Primary Care: System, Pcp Not In Primary Cardiologist:Thomas Claiborne Billings, MD  Chief Complaint:    Chief Complaint  Patient presents with  . Code STEMI    History of Present Illness:      The patient is a 64 year old man who was admitted with a STEMI.  He experience of out-of-hospital arrest requiring CPR and defibrillation once EMS had arrived onsite.  He eventually achieved ROSC within approximately 10 minutes.  The patient was transferred to [facility] where he underwent left heart catheterization demonstrating multivessel coronary artery disease.  This event was 9 days ago.  Since then he has improved neurologically.  Consultation is obtained from cardiac surgery for consideration of coronary artery bypass grafting given his status as a diabetic and multivessel coronary disease.  Since admission he has been hemodynamically stable.  Current Activity/ Functional Status: Patient will be independent with mobility/ambulation, transfers, ADL's, IADL's.   Zubrod Score: At the time of surgery this patient's most appropriate activity status/level should be described as: []     0    Normal activity, no symptoms []     1    Restricted in physical strenuous activity but ambulatory, able to do out light work []     2    Ambulatory and capable of self care, unable to do work activities, up and about                 more than 50%  Of the time                            []     3    Only limited self care, in bed greater than 50% of waking hours []     4    Completely disabled, no self care, confined to bed or chair []     5    Moribund  Past Medical History:  Diagnosis Date  . Cardiac arrest ([place]) 02/28/2020  . Diabetes mellitus without complication ([place])   . Hypertension     Past Surgical  History:  Procedure Laterality Date  . LEFT HEART CATH AND CORONARY ANGIOGRAPHY N/A 02/28/2020   Procedure: LEFT HEART CATH AND CORONARY ANGIOGRAPHY;  Surgeon: Troy Sine, MD;  Location: [facility];  Service: Cardiovascular;  Laterality: N/A;    Social History   Tobacco Use  Smoking Status Never Smoker  Smokeless Tobacco Never Used    Social History   Substance and Sexual Activity  Alcohol Use Not Currently     No Known Allergies  Current Facility-Administered Medications  Medication Dose Route Frequency Provider Last Rate Last Admin  . 0.9 %  sodium chloride infusion   Intravenous Continuous Delora Fuel, MD 10 mL/hr at 02/28/20 0144 New Bag at 02/28/20 0144  . 0.9 %  sodium chloride infusion  250 mL Intravenous PRN Troy Sine, MD   Stopped at 03/05/20 1540  . acetaminophen (TYLENOL) 160 MG/5ML solution 650 mg  650 mg Per Tube Q4H PRN Shellia Cleverly, MD   650 mg at 03/01/20 0814  . alum & mag hydroxide-simeth (MAALOX/MYLANTA) 200-200-20 MG/5ML suspension 30 mL  30 mL Oral Q6H PRN  Vernelle Emerald, MD   30 mL at 03/07/20 1851  . amLODipine (NORVASC) tablet 5 mg  5 mg Oral Daily Leonie Man, MD   5 mg at 03/07/20 1017  . aspirin chewable tablet 81 mg  81 mg Oral Daily Candee Furbish, MD   81 mg at 03/07/20 1017  . Chlorhexidine Gluconate Cloth 2 % PADS 6 each  6 each Topical Daily Candee Furbish, MD   6 each at 03/05/20 1359  . clopidogrel (PLAVIX) tablet 75 mg  75 mg Oral Daily Leonie Man, MD   75 mg at 03/07/20 1016  . diphenhydrAMINE (BENADRYL) injection 25 mg  25 mg Intravenous QHS PRN Bowser, Laurel Dimmer, NP      . feeding supplement (GLUCERNA SHAKE) (GLUCERNA SHAKE) liquid 237 mL  237 mL Oral BID BM Agarwala, Ravi, MD   237 mL at 03/07/20 1600  . hydrALAZINE (APRESOLINE) injection 2-5 mg  2-5 mg Intravenous Q4H PRN Leonie Man, MD      . insulin aspart (novoLOG) injection 0-15 Units  0-15 Units Subcutaneous TID WC Kipp Brood, MD   5 Units  at 03/07/20 1744  . insulin aspart (novoLOG) injection 0-5 Units  0-5 Units Subcutaneous QHS Agarwala, Ravi, MD      . irbesartan (AVAPRO) tablet 37.5 mg  37.5 mg Oral Daily Sande Rives E, PA-C   37.5 mg at 03/07/20 1017  . MEDLINE mouth rinse  15 mL Mouth Rinse BID Candee Furbish, MD   15 mL at 03/07/20 2202  . metoprolol tartrate (LOPRESSOR) injection 2.5 mg  2.5 mg Intravenous Q4H PRN Leonie Man, MD      . metoprolol tartrate (LOPRESSOR) tablet 50 mg  50 mg Oral BID Einar Grad, RPH   50 mg at 03/07/20 2158  . ondansetron (ZOFRAN) injection 4 mg  4 mg Intravenous Q6H PRN Troy Sine, MD      . polyethylene glycol (MIRALAX / GLYCOLAX) packet 17 g  17 g Oral Daily Collene Gobble, MD   17 g at 03/06/20 0907  . Resource ThickenUp Clear   Oral PRN Brand Males, MD      . rosuvastatin (CRESTOR) tablet 40 mg  40 mg Oral Daily Leonie Man, MD   40 mg at 03/07/20 1016  . senna-docusate (Senokot-S) tablet 1 tablet  1 tablet Oral BID Collene Gobble, MD   1 tablet at 03/06/20 0028  . sodium chloride flush (NS) 0.9 % injection 3 mL  3 mL Intravenous Q12H Troy Sine, MD   3 mL at 03/07/20 2202  . sodium chloride flush (NS) 0.9 % injection 3 mL  3 mL Intravenous PRN Troy Sine, MD        Medications Prior to Admission  Medication Sig Dispense Refill Last Dose  . cetirizine (ZYRTEC) 10 MG chewable tablet Chew 10 mg by mouth daily.   unknown at unknown  . colestipol (COLESTID) 1 g tablet Take 1 g by mouth 2 (two) times daily.   unknown at unknown  . diltiazem (DILACOR XR) 180 MG 24 hr capsule Take 180 mg by mouth daily.   unknown at unknown  . empagliflozin (JARDIANCE) 25 MG TABS tablet Take 25 mg by mouth daily.   unknown at unknown  . fenofibrate (TRICOR) 145 MG tablet Take 145 mg by mouth daily.   unknown at unknown  . insulin aspart (NOVOLOG) 100 UNIT/ML injection Inject 8 Units into the skin 3 (three) times  daily before meals.   unknown at unknown  . Insulin  Degludec (TRESIBA) 100 UNIT/ML SOLN Inject 90 Units into the skin daily.   unknown at unknown  . metFORMIN (GLUCOPHAGE) 500 MG tablet Take 500-1,000 mg by mouth See admin instructions. Take 2 tablets by mouth every morning and 1 tablet every evening   unknown at unknown  . pravastatin (PRAVACHOL) 40 MG tablet Take 40 mg by mouth daily.   unknown at unknown  . valsartan (DIOVAN) 320 MG tablet Take 320 mg by mouth daily.   unknown at unknown    History reviewed. No pertinent family history.   Review of Systems:   ROS A comprehensive review of systems was negative.     Cardiac Review of Systems: Y or  [    ]= no  Chest Pain [    ]  Resting SOB [   ] Exertional SOB  [  ]  Orthopnea [  ]   Pedal Edema [   ]    Palpitations [  ] Syncope  [  ]   Presyncope [   ]  General Review of Systems: [Y] = yes [  ]=no Constitional: recent weight change [  ]; anorexia [  ]; fatigue [  ]; nausea [  ]; night sweats [  ]; fever [  ]; or chills [  ]                                                               Dental: Last Dentist visit:   Eye : blurred vision [  ]; diplopia [   ]; vision changes [  ];  Amaurosis fugax[  ]; Resp: cough [  ];  wheezing[  ];  hemoptysis[  ]; shortness of breath[  ]; paroxysmal nocturnal dyspnea[  ]; dyspnea on exertion[  ]; or orthopnea[  ];  GI:  gallstones[  ], vomiting[  ];  dysphagia[  ]; melena[  ];  hematochezia [  ]; heartburn[  ];   Hx of  Colonoscopy[  ]; GU: kidney stones [  ]; hematuria[  ];   dysuria [  ];  nocturia[  ];  history of     obstruction [  ]; urinary frequency [  ]             Skin: rash, swelling[  ];, hair loss[  ];  peripheral edema[  ];  or itching[  ]; Musculosketetal: myalgias[  ];  joint swelling[  ];  joint erythema[  ];  joint pain[  ];  back pain[  ];  Heme/Lymph: bruising[  ];  bleeding[  ];  anemia[  ];  Neuro: TIA[  ];  headaches[  ];  stroke[  ];  vertigo[  ];  seizures[  ];   paresthesias[  ];  difficulty walking[  ];  Psych:depression[  ];  anxiety[  ];  Endocrine: diabetes[  ];  thyroid dysfunction[  ];     Physical Exam: BP 135/73 (BP Location: Right Arm)   Pulse (!) 57   Temp 97.6 F (36.4 C) (Oral)   Resp 16   Ht 5\' 8"  (1.727 m)   Wt 86.6 kg   SpO2 100%   BMI 29.04 kg/m    General appearance: alert, appears stated age  and mildly confused Head: Normocephalic, without obvious abnormality, atraumatic Resp: clear to auscultation bilaterally Cardio: regular rate and rhythm, S1, S2 normal, no murmur, click, rub or gallop GI: soft, non-tender; bowel sounds normal; no masses,  no organomegaly Extremities: extremities normal, atraumatic, no cyanosis or edema Neurologic: Grossly normal  Diagnostic Studies & Laboratory data:     Recent Radiology Findings:   CT HEAD WO CONTRAST  Result Date: 03/08/2020 CLINICAL DATA:  Traumatic brain injury, change in neurologic status, history of anoxic brain injury EXAM: CT HEAD WITHOUT CONTRAST TECHNIQUE: Contiguous axial images were obtained from the base of the skull through the vertex without intravenous contrast. COMPARISON:  03/04/2020, 03/03/2020 FINDINGS: Brain: No acute infarct or hemorrhage. Lateral ventricles and midline structures are stable. No acute extra-axial fluid collections. No mass effect. Vascular: No hyperdense vessel or unexpected calcification. Skull: Normal. Negative for fracture or focal lesion. Sinuses/Orbits: Minimal mucosal thickening right maxillary sinus. Remaining sinuses are clear. Other: None. IMPRESSION: No acute intracranial process. Electronically Signed   By: Randa Ngo M.D.   On: 03/08/2020 00:37     I have independently reviewed the above radiologic studies and discussed with the patient   Recent Lab Findings: Lab Results  Component Value Date   WBC 10.4 03/07/2020   HGB 14.8 03/07/2020   HCT 43.6 03/07/2020   PLT 237 03/07/2020   GLUCOSE 183 (H) 03/06/2020   CHOL 272 (H) 02/28/2020   TRIG 158 (H) 03/02/2020   HDL NOT REPORTED DUE TO  HIGH TRIGLYCERIDES 02/28/2020   LDLDIRECT 61.9 02/28/2020   LDLCALC UNABLE TO CALCULATE IF TRIGLYCERIDE OVER 400 mg/dL 02/28/2020   ALT 44 03/06/2020   AST 51 (H) 03/06/2020   NA 142 03/06/2020   K 4.1 03/06/2020   CL 107 03/06/2020   CREATININE 0.93 03/06/2020   BUN 24 (H) 03/06/2020   CO2 24 03/06/2020   TSH 2.920 03/03/2020   INR 1.1 02/28/2020   HGBA1C 8.2 (H) 02/28/2020      Assessment / Plan:      Pleasant 64 yo man fortunate to have survived an OOH arrest with relatively quick ROSC. He continues to recover from mild neurologic injury. I agree that CIR admission to augment neurological recovery would be advisable prior to seriously considering CABG. I would be very concerned about neurological set back if he underwent CABG at this point. Additionally, he is appropriately treated with Plavix, so we would need to wait a week or so off Plavix anyway.     I  spent 30 minutes counseling the patient face to face.   Sakari Raisanen Z. Orvan Seen, MD [phone] 03/08/2020 8:55 AM

## 2020-03-08 NOTE — Progress Notes (Addendum)
03/07/20 2317  What Happened  Was fall witnessed? No  Was patient injured? No  Patient found on floor mat  Found by Staff-comment Marye Round B, RN)  Stated prior activity other (comment) (Pt in bed resting )  Follow Up  MD notified B. Chotiner, MD  Time MD notified 2318  Family notified Yes - comment Quindarrius [place])  Time family notified 0121  Additional tests Yes-comment (CT of head)  Adult Fall Risk Assessment  Risk Factor Category (scoring not indicated) High fall risk per protocol (document High fall risk)  Age 64  Fall History: Fall within 6 months prior to admission 0  Elimination; Bowel and/or Urine Incontinence 2  Elimination; Bowel and/or Urine Urgency/Frequency 2  Medications: includes PCA/Opiates, Anti-convulsants, Anti-hypertensives, Diuretics, Hypnotics, Laxatives, Sedatives, and Psychotropics 3  Patient Care Equipment 1  Mobility-Assistance 2  Mobility-Gait 2  Mobility-Sensory Deficit 0  Altered awareness of immediate physical environment 1  Impulsiveness 2  Lack of understanding of one's physical/cognitive limitations 4  Total Score 20  Patient Fall Risk Level High fall risk  Adult Fall Risk Interventions  Required Bundle Interventions *See Row Information* High fall risk - low, moderate, and high requirements implemented  Additional Interventions Use of appropriate toileting equipment (bedpan, BSC, etc.);Room near nurses station;Reorient/diversional activities with confused patients  Screening for Fall Injury Risk (To be completed on HIGH fall risk patients) - Assessing Need for Low Bed  Risk For Fall Injury- Low Bed Criteria None identified - Continue screening  Screening for Fall Injury Risk (To be completed on HIGH fall risk patients who do not meet crieteria for Low Bed) - Assessing Need for Floor Mats Only  Risk For Fall Injury- Criteria for Floor Mats Confusion/dementia (+NuDESC, CIWA, TBI, etc.);Noncompliant with safety precautions  Will Implement  Floor Mats Yes  Vitals  Temp 98.6 F (37 C)  Temp Source Oral  BP (!) 142/80  MAP (mmHg) 98  BP Location Right Arm  BP Method Automatic  Patient Position (if appropriate) Sitting  Pulse Rate (!) 56  Pulse Rate Source Monitor  Resp 16  Oxygen Therapy  SpO2 100 %  O2 Device Room Air  Pain Assessment  Pain Scale 0-10  Pain Score 0  Neurological  Neuro (WDL) X  Level of Consciousness Alert  Orientation Level Oriented X4  Cognition Impulsive;Poor attention/concentration;Poor judgement;Poor safety awareness;Follows commands  Speech Clear  Pupil Assessment  Yes  R Pupil Size (mm) 3  R Pupil Shape Round  R Pupil Reaction Brisk  L Pupil Size (mm) 3  L Pupil Shape Round  L Pupil Reaction Brisk  R Hand Grip Moderate  L Hand Grip Moderate   Neuro Symptoms Forgetful  Glasgow Coma Scale  Eye Opening 4  Best Verbal Response (NON-intubated) 5  Best Motor Response 6  Glasgow Coma Scale Score 15  Musculoskeletal  Musculoskeletal (WDL) X  Assistive Device None  Generalized Weakness Yes  Integumentary  Integumentary (WDL) X   Skin Integrity Abrasion (present prior to fall)  Abrasion Location Chest;Knee   Pt had an unwitnessed fall. Bed alarm, falls risk bracelet and yellow socks all in place at time of fall. Patient found on floor mat, on left side of the bed although left siderail remained up.  Pt denies hitting his head and also denies pain, no obvious injury noted. Vital signs documented accordingly. Patient alert and oriented and able to follow commands. RN assessed patient and  notified Hospitalist on call provider of event, received order for CT of head.  Pt assisted to a low bed, bed alarm re-activated and floor mats present. Family notified by RN.

## 2020-03-08 NOTE — Progress Notes (Signed)
Physical Therapy Treatment Patient Details Name: Richard Hatfield MRN: [id number] DOB: July 31, 1956 Today's Date: 03/08/2020    History of Present Illness Pt adm 7/13 with cardiac arrest. Coronary evaluation revealed diffuse obstructive CAD however no obvious acute thrombus as etiology for arrest. Intubated at the scene and extubated 7/15. PMH - HTN, DM    PT Comments    Pt in low bed after fall last night. Pt with no recollection of event. Pt agreeable to working with therapy. Pt continues to be limited in safe mobility by decreased cognition, in particular decreased safety awareness, and divided attention, decreased short term memory. Pt is min guard for safety with transfers, min A for steadying with transfers and min A for ambulation due to LoB with attempts to multitask. Pt is scheduled for d/c to CIR this afternoon and will benefit greatly from intensive rehab.    Follow Up Recommendations  CIR     Equipment Recommendations  Other (comment) (To be determined)       Precautions / Restrictions Precautions Precautions: Fall Precaution Comments: fell last night trying to get out of bed Restrictions Weight Bearing Restrictions: No    Mobility  Bed Mobility Overal bed mobility: Needs Assistance Bed Mobility: Supine to Sit     Supine to sit: Min guard Sit to supine: Min guard   General bed mobility comments: min guard for safety   Transfers Overall transfer level: Needs assistance Equipment used: Rolling walker (2 wheeled) Transfers: Sit to/from Stand Sit to Stand: Min assist         General transfer comment: min A for steadying in standing, vc for hand placement for power up   Ambulation/Gait Ambulation/Gait assistance: Min assist;+2 safety/equipment (chair follow due to unknown response to fatigue ) Gait Distance (Feet): 120 Feet Assistive device: Rolling walker (2 wheeled) Gait Pattern/deviations: Step-through pattern;Decreased step length - right;Decreased step  length - left Gait velocity: slowed Gait velocity interpretation: <1.8 ft/sec, indicate of risk for recurrent falls General Gait Details: min A for occassional LoB, with multitasking walking and talking          Balance Overall balance assessment: Needs assistance Sitting-balance support: Feet supported;Bilateral upper extremity supported Sitting balance-Leahy Scale: Poor Sitting balance - Comments: able to sit EoB for 5 minutes, progressing from min A for steadying to min guard for safety given increased sway in all directions    Standing balance support: Single extremity supported;Bilateral upper extremity supported;No upper extremity supported Standing balance-Leahy Scale: Fair Standing balance comment: requires UE support for anything dynamic                             Cognition Arousal/Alertness: Awake/alert Behavior During Therapy: WFL for tasks assessed/performed Overall Cognitive Status: Impaired/Different from baseline Area of Impairment: Orientation;Attention;Memory;Following commands;Safety/judgement;Problem solving;Awareness                 Orientation Level: Disoriented to;Time;Situation Current Attention Level: Selective Memory: Decreased short-term memory Following Commands: Follows multi-step commands consistently;Follows one step commands consistently (requires constant commands to maintain task ) Safety/Judgement: Decreased awareness of safety;Decreased awareness of deficits Awareness: Intellectual Problem Solving: Difficulty sequencing;Requires verbal cues;Requires tactile cues General Comments: able to correctly follow commands but needs be given constant cuing to stay on task          General Comments General comments (skin integrity, edema, etc.): VSS, max noted HR with ambulation 75bpm      Pertinent Vitals/Pain Pain Assessment: Faces Faces Pain Scale:  Hurts a little bit Pain Location: generalized from being in bed  Pain  Descriptors / Indicators: Sore           PT Goals (current goals can now be found in the care plan section) Acute Rehab PT Goals Patient Stated Goal: Pt unable PT Goal Formulation: With family Time For Goal Achievement: 03/16/20 Potential to Achieve Goals: Good    Frequency    Min 3X/week      PT Plan Current plan remains appropriate       AM-PAC PT "6 Clicks" Mobility   Outcome Measure  Help needed turning from your back to your side while in a flat bed without using bedrails?: A Little Help needed moving from lying on your back to sitting on the side of a flat bed without using bedrails?: A Little Help needed moving to and from a bed to a chair (including a wheelchair)?: A Lot Help needed standing up from a chair using your arms (e.g., wheelchair or bedside chair)?: Total Help needed to walk in hospital room?: Total Help needed climbing 3-5 steps with a railing? : Total 6 Click Score: 11    End of Session Equipment Utilized During Treatment: Gait belt Activity Tolerance: Patient tolerated treatment well Patient left: in bed;with bed alarm set (CIR PA in room performing intake assessment) Nurse Communication: Mobility status PT Visit Diagnosis: Other abnormalities of gait and mobility (R26.89);Muscle weakness (generalized) (M62.81);Other symptoms and signs involving the nervous system (R29.898);Difficulty in walking, not elsewhere classified (R26.2)     Time: 1638-4665 PT Time Calculation (min) (ACUTE ONLY): 17 min  Charges:  $Gait Training: 8-22 mins                     Chrissi Crow B. Migdalia Dk PT, DPT Acute Rehabilitation Services Pager [phone] Office [phone]    [place] 03/08/2020, 12:59 PM

## 2020-03-09 ENCOUNTER — Inpatient Hospital Stay (HOSPITAL_COMMUNITY): Payer: BC Managed Care – PPO | Admitting: Physical Therapy

## 2020-03-09 ENCOUNTER — Inpatient Hospital Stay (HOSPITAL_COMMUNITY): Payer: BC Managed Care – PPO

## 2020-03-09 ENCOUNTER — Inpatient Hospital Stay (HOSPITAL_COMMUNITY): Payer: BC Managed Care – PPO | Admitting: Speech Pathology

## 2020-03-09 ENCOUNTER — Inpatient Hospital Stay (HOSPITAL_COMMUNITY): Payer: BC Managed Care – PPO | Admitting: Occupational Therapy

## 2020-03-09 DIAGNOSIS — I472 Ventricular tachycardia, unspecified: Secondary | ICD-10-CM

## 2020-03-09 DIAGNOSIS — E118 Type 2 diabetes mellitus with unspecified complications: Secondary | ICD-10-CM

## 2020-03-09 DIAGNOSIS — I2510 Atherosclerotic heart disease of native coronary artery without angina pectoris: Secondary | ICD-10-CM

## 2020-03-09 DIAGNOSIS — R7401 Elevation of levels of liver transaminase levels: Secondary | ICD-10-CM

## 2020-03-09 LAB — COMPREHENSIVE METABOLIC PANEL
ALT: 65 U/L — ABNORMAL HIGH (ref 0–44)
AST: 66 U/L — ABNORMAL HIGH (ref 15–41)
Albumin: 3.1 g/dL — ABNORMAL LOW (ref 3.5–5.0)
Alkaline Phosphatase: 71 U/L (ref 38–126)
Anion gap: 10 (ref 5–15)
BUN: 29 mg/dL — ABNORMAL HIGH (ref 8–23)
CO2: 23 mmol/L (ref 22–32)
Calcium: 9.4 mg/dL (ref 8.9–10.3)
Chloride: 101 mmol/L (ref 98–111)
Creatinine, Ser: 1.53 mg/dL — ABNORMAL HIGH (ref 0.61–1.24)
GFR calc Af Amer: 56 mL/min — ABNORMAL LOW (ref >60)
GFR calc non Af Amer: 48 mL/min — ABNORMAL LOW (ref >60)
Glucose, Bld: 259 mg/dL — ABNORMAL HIGH (ref 70–99)
Potassium: 4.5 mmol/L (ref 3.5–5.1)
Sodium: 134 mmol/L — ABNORMAL LOW (ref 135–145)
Total Bilirubin: 0.7 mg/dL (ref 0.3–1.2)
Total Protein: 6.6 g/dL (ref 6.5–8.1)

## 2020-03-09 LAB — GLUCOSE, CAPILLARY
Glucose-Capillary: 220 mg/dL — ABNORMAL HIGH (ref 70–99)
Glucose-Capillary: 201 mg/dL — ABNORMAL HIGH (ref 70–99)
Glucose-Capillary: 194 mg/dL — ABNORMAL HIGH (ref 70–99)
Glucose-Capillary: 202 mg/dL — ABNORMAL HIGH (ref 70–99)

## 2020-03-09 LAB — PULMONARY FUNCTION TEST
FEF 25-75 Pre: 2.69 L/sec
FEF2575-%Pred-Pre: 98 %
FEV1-%Pred-Pre: 72 %
FEV1-Pre: 2.41 L
FEV1FVC-%Pred-Pre: 113 %
FEV6-%Pred-Pre: 67 %
FEV6-Pre: 2.81 L
FEV6FVC-%Pred-Pre: 105 %
FVC-%Pred-Pre: 63 %
FVC-Pre: 2.81 L
Pre FEV1/FVC ratio: 86 %
Pre FEV6/FVC Ratio: 100 %

## 2020-03-09 LAB — CBC WITH DIFFERENTIAL/PLATELET
Abs Immature Granulocytes: 0.24 10*3/uL — ABNORMAL HIGH (ref 0.00–0.07)
Basophils Absolute: 0.1 10*3/uL (ref 0.0–0.1)
Basophils Relative: 1 %
Eosinophils Absolute: 0.3 10*3/uL (ref 0.0–0.5)
Eosinophils Relative: 3 %
HCT: 42.1 % (ref 39.0–52.0)
Hemoglobin: 14.2 g/dL (ref 13.0–17.0)
Immature Granulocytes: 2 %
Lymphocytes Relative: 15 %
Lymphs Abs: 1.8 10*3/uL (ref 0.7–4.0)
MCH: 32.1 pg (ref 26.0–34.0)
MCHC: 33.7 g/dL (ref 30.0–36.0)
MCV: 95.2 fL (ref 80.0–100.0)
Monocytes Absolute: 1.2 10*3/uL — ABNORMAL HIGH (ref 0.1–1.0)
Monocytes Relative: 10 %
Neutro Abs: 7.9 10*3/uL — ABNORMAL HIGH (ref 1.7–7.7)
Neutrophils Relative %: 69 %
Platelets: 244 10*3/uL (ref 150–400)
RBC: 4.42 MIL/uL (ref 4.22–5.81)
RDW: 11.4 % — ABNORMAL LOW (ref 11.5–15.5)
WBC: 11.4 10*3/uL — ABNORMAL HIGH (ref 4.0–10.5)
nRBC: 0 % (ref 0.0–0.2)

## 2020-03-09 NOTE — Evaluation (Signed)
Occupational Therapy Assessment and Plan  Patient Details  Name: Richard Hatfield MRN: [id number] Date of Birth: 1955/10/22  OT Diagnosis: cognitive deficits and muscle weakness (generalized) Rehab Potential: Rehab Potential (ACUTE ONLY): Good ELOS: 10-12 days   Today's Date: 03/09/2020 OT Individual Time: 0902-1000 OT Individual Time Calculation (min): 58 min     Hospital Problem: Principal Problem:   Anoxic brain injury ([place]) Active Problems:   Type 2 diabetes mellitus with complication, without long-term current use of insulin ([place])   Coronary artery disease involving native heart without angina pectoris   Transaminitis   NSVT (nonsustained ventricular tachycardia) ([place])   Past Medical History:  Past Medical History:  Diagnosis Date  . Cardiac arrest ([place]) 02/28/2020  . Diabetes mellitus without complication ([place])   . Hypertension    Past Surgical History:  Past Surgical History:  Procedure Laterality Date  . LEFT HEART CATH AND CORONARY ANGIOGRAPHY N/A 02/28/2020   Procedure: LEFT HEART CATH AND CORONARY ANGIOGRAPHY;  Surgeon: Troy Sine, MD;  Location: [facility];  Service: Cardiovascular;  Laterality: N/A;    Assessment & Plan Clinical Impression: Patient is a 64 y.o. year old male with recent admission to the hospital on   with history of HTN, T2DM, ETOH abuse; who was admitted 07/13/21after cardiac arrest. Per reports, Wife woke up husband having jerking movements and difficulty breathing,activated EMS, performed CPR X 8 minutes followed by 12 minutes of CPR/ACLS for VT by EMS with ROSC. Patient mildly responsive to sternal rubs at admission and intubated in ED. UDS negative. EKG with mild ST changes and he was taken to cath lab and was found to have diffuse CAD with EF 45-50% and no acute thrombus.  VF arrest felt to be due to transient LAD coronary spasm superimposed on significant multivessel CAD and CVTS consult recommended for input. He continued to have  decrease in LOC with sluggish movements and EEG showed severe diffuse encephalopathy felt to be due to sedation or anoxic BI and was negative for seizures.    He was extubated without difficulty on 07/15 and was noted to have confusion with bouts of agitation question ETOH withdrawal v/s delirium.  He continues on IV heparin as well as IV lopressor due to NSVT. He received IV ativan on am of 07/17 with obtundation, hypoxia with bouts of apnea and sluggish pupils therefore code stroke activated and he was transferred to ICU.  Neurology consulted for input and questioned stroke as cause of MS changes. CT  Head was negative for bleed or acute changes. CTA head/neck and cerebral perfusion study done and was negative for infarct or ischemia. MRI done revealing T2 flare felt to be due to anoxic injury. Bedside swallow showed signs of dysphagia and full liquid diet recommended. He was also noted to be severely dysarthric with rapid rate of speech, verbose, cognitive deficits and weakness. CIR recommended due to functional decline.  Patient transferred to CIR on 03/08/2020 .    Patient currently requires min/ mod with basic self-care skills secondary to muscle weakness, decreased cardiorespiratoy endurance, decreased initiation, decreased attention, decreased awareness, decreased problem solving, decreased safety awareness, decreased memory and delayed processing and decreased sitting balance, decreased standing balance and decreased balance strategies.  Prior to hospitalization, patient could complete BADL with independent .  Patient will benefit from skilled intervention to increase independence with basic self-care skills prior to discharge home with care partner.  Anticipate patient will require 24 hour supervision and follow up home health.  OT -  End of Session Endurance Deficit: Yes Endurance Deficit Description: rest breaks within BADL Tasks OT Assessment Rehab Potential (ACUTE ONLY): Good OT Patient  demonstrates impairments in the following area(s): Balance;Behavior;Cognition;Endurance;Motor;Safety OT Basic ADL's Functional Problem(s): Grooming;Eating;Bathing;Dressing;Toileting OT Transfers Functional Problem(s): Toilet;Tub/Shower OT Additional Impairment(s): None OT Plan OT Intensity: Minimum of 1-2 x/day, 45 to 90 minutes OT Frequency: 5 out of 7 days OT Duration/Estimated Length of Stay: 10-12 days OT Treatment/Interventions: Balance/vestibular training;Cognitive remediation/compensation;Community reintegration;Discharge planning;Disease mangement/prevention;DME/adaptive equipment instruction;Functional mobility training;Pain management;Neuromuscular re-education;Patient/family education;Psychosocial support;Self Care/advanced ADL retraining;Splinting/orthotics;Therapeutic Activities;Therapeutic Exercise;UE/LE Strength taining/ROM;UE/LE Coordination activities OT Self Feeding Anticipated Outcome(s): supervision OT Basic Self-Care Anticipated Outcome(s): supervision OT Toileting Anticipated Outcome(s): supervision OT Bathroom Transfers Anticipated Outcome(s): supervision OT Recommendation Patient destination: Home Follow Up Recommendations: Home health OT Equipment Recommended: To be determined   OT Evaluation Precautions/Restrictions  Precautions Precautions: Fall Restrictions Weight Bearing Restrictions: No Pain Pain Assessment Pain Scale: 0-10 Pain Score: 0-No pain Home Living/Prior Functioning Home Living Family/patient expects to be discharged to:: Inpatient rehab Living Arrangements: Spouse/significant other Available Help at Discharge: Family, Available 24 hours/day (wife would have to take FMLA) Type of Home: House Home Access: Stairs to enter Entrance Stairs-Number of Steps: 1 Home Layout: Two level, Able to live on main level with bedroom/bathroom Bathroom Shower/Tub: Tub/shower unit, Curtain Bathroom Toilet: Standard Bathroom Accessibility: Yes  Lives With:  Spouse IADL History Occupation: Full time employment 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 Comments: Enjoys watching [place] sports Prior Function Level of Independence: Independent with homemaking with ambulation, Independent with basic ADLs Comments: 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 Cognition Overall Cognitive Status: Impaired/Different from baseline Arousal/Alertness: Awake/alert Orientation Level: Person;Place;Situation Person: Oriented Place: Oriented Situation: Oriented Year: 2021 Month: July Day of Week: Incorrect Memory: Impaired Memory Impairment: Retrieval deficit;Decreased recall of new information;Decreased short term memory Immediate Memory Recall: Blue;Sock;Bed Memory Recall Sock: Without Cue Memory Recall Blue: Not able to recall Memory Recall Bed: Not able to recall Attention: Focused;Sustained Focused Attention: Appears intact Sustained Attention: Impaired Sustained Attention Impairment: Verbal basic;Functional basic Awareness: Impaired Awareness Impairment: Intellectual impairment Problem Solving: Impaired Problem Solving Impairment: Verbal basic;Functional basic Executive Function: Reasoning;Sequencing;Organizing Sequencing: Impaired Sequencing Impairment: Verbal complex (Clock drawing: 2/4) Organizing: Appears intact (Backward digit span: 3/3) Safety/Judgment: Impaired Sensation Sensation Light Touch: Appears Intact Coordination Gross Motor Movements are Fluid and Coordinated: No Fine Motor Movements are Fluid and Coordinated: No Coordination and Movement Description: decreased smoothness and accuracy, generalized weakness Motor  Motor Motor: Within Functional Limits Motor - Skilled Clinical Observations: generalized weakness  Balance Balance Balance Assessed: Yes Static Sitting Balance Static Sitting - Balance Support: Feet supported Static Sitting - Level of Assistance: 5: Stand by assistance Dynamic Sitting Balance Dynamic  Sitting - Balance Support: During functional activity Dynamic Sitting - Level of Assistance: 4: Min assist Static Standing Balance Static Standing - Balance Support: During functional activity Static Standing - Level of Assistance: 4: Min assist Dynamic Standing Balance Dynamic Standing - Balance Support: During functional activity Dynamic Standing - Level of Assistance: 3: Mod assist Extremity/Trunk Assessment RUE Assessment RUE Assessment: Exceptions to WFL General Strength Comments: generalized weakness 4/5 LUE Assessment LUE Assessment: Exceptions to WFL General Strength Comments: generalized weakness 4/5  Care Tool Care Tool Self Care Eating   Eating Assist Level: Minimal Assistance - Patient > 75%    Oral Care    Oral Care Assist Level: Minimal Assistance - Patient > 75%    Bathing   Body parts bathed by patient: Left arm;Right arm;Abdomen;Front   perineal area;Buttocks;Right upper leg;Left upper leg;Face;Left lower leg;Right lower leg   Body parts n/a: Chest (pt has life vest) Assist Level: Minimal Assistance - Patient > 75%    Upper Body Dressing(including orthotics)   What is the patient wearing?: Pull over shirt   Assist Level: Minimal Assistance - Patient > 75%    Lower Body Dressing (excluding footwear)   What is the patient wearing?: Pants Assist for lower body dressing: Minimal Assistance - Patient > 75%    Putting on/Taking off footwear   What is the patient wearing?: Non-skid slipper socks Assist for footwear: Moderate Assistance - Patient 50 - 74%       Care Tool Toileting Toileting activity   Assist for toileting: Moderate Assistance - Patient 50 - 74%     Care Tool Bed Mobility Roll left and right activity        Sit to lying activity        Lying to sitting edge of bed activity         Care Tool Transfers Sit to stand transfer   Sit to stand assist level: Minimal Assistance - Patient > 75%    Chair/bed transfer         Toilet  transfer   Assist Level: Moderate Assistance - Patient 50 - 74%     Care Tool Cognition Expression of Ideas and Wants Expression of Ideas and Wants: Some difficulty - exhibits some difficulty with expressing needs and ideas (e.g, some words or finishing thoughts) or speech is not clear   Understanding Verbal and Non-Verbal Content Understanding Verbal and Non-Verbal Content: Sometimes understands - understands only basic conversations or simple, direct phrases. Frequently requires cues to understand   Memory/Recall Ability *first 3 days only Memory/Recall Ability *first 3 days only: Current season;That he or she is in a hospital/hospital unit    Refer to Care Plan for [place] 1 OT Short Term Goal 1 (Week 1): Pt will complete LB dressing with min A OT Short Term Goal 2 (Week 1): Pt will maintain standing balance at the sink within BADL task with CGA OT Short Term Goal 3 (Week 1): Pt will tolerate standing activity for 5 mins in preparation for BADL tasks  Recommendations for other services: None    Skilled Therapeutic Intervention  Pt greeted sitting in recliner at nurses station and agreeable to OT eval and treat. Pt brought back to room for BADL tasks. Bathing completed from recliner at the sink with Life vest on and washed around life vest with min verbal cues. Pt was able to use items appropriately. Pt then stood with min A, but needed min/mod A for dynamic balance when reaching to wash buttocks and peri-area. OT handed pt his clothes and he was able to don shirt with min A , and min/Mod A for LB dressing. Pt perseverating on talking about the [facility] as OT mentioned she also went to school there. Pt able to be redirected to functional task at hand. OT educated on rehab process, OT purpose, POC, ELOS, and goals.  Pt returned to nurses station for safety 2.2 high fall risk and poor safety awareness.   ADL ADL Eating: Minimal assistance Grooming:  Minimal assistance Upper Body Bathing: Minimal assistance Lower Body Bathing: Moderate assistance Upper Body Dressing: Minimal assistance Lower Body Dressing: Moderate assistance Toileting: Minimal assistance Toilet Transfer: Moderate assistance Mobility  Transfers Sit to Stand: Minimal Assistance - Patient > 75% Stand to  Sit: Minimal Assistance - Patient > 75%   Discharge Criteria: Patient will be discharged from OT if patient refuses treatment 3 consecutive times without medical reason, if treatment goals not met, if there is a change in medical status, if patient makes no progress towards goals or if patient is discharged from hospital.  The above assessment, treatment plan, treatment alternatives and goals were discussed and mutually agreed upon: by patient   S  03/09/2020, 3:50 PM  

## 2020-03-09 NOTE — Care Management (Signed)
Inpatient [facility] Statement of Services  Patient Name:  Richard Hatfield  Date:  03/09/2020  Welcome to the [facility].  Our goal is to provide you with an individualized program based on your diagnosis and situation, designed to meet your specific needs.  With this comprehensive rehabilitation program, you will be expected to participate in at least 3 hours of rehabilitation therapies Monday-Friday, with modified therapy programming on the weekends.  Your rehabilitation program will include the following services:  Physical Therapy (PT), Occupational Therapy (OT), Speech Therapy (ST), 24 hour per day rehabilitation nursing, Therapeutic Recreaction (TR), Psychology, Neuropsychology, Care Coordinator, Rehabilitation Medicine, Nutrition Services, Pharmacy Services and Other  Weekly team conferences will be held on Tuesdays to discuss your progress.  Your Inpatient Rehabilitation Care Coordinator will talk with you frequently to get your input and to update you on team discussions.  Team conferences with you and your family in attendance may also be held.  Expected length of stay: 10-12 days  Overall anticipated outcome: Supervision  Depending on your progress and recovery, your program may change. Your Inpatient Rehabilitation Care Coordinator will coordinate services and will keep you informed of any changes. Your Inpatient Rehabilitation Care Coordinator's name and contact numbers are listed  below.  The following services may also be recommended but are not provided by the [facility] will be made to provide these services after discharge if needed.  Arrangements include referral to agencies that provide these services.  Your insurance has been verified to be:  [place]  Your primary doctor  is:  PCP not in system  Pertinent information will be shared with your doctor and your insurance company.  Inpatient Rehabilitation Care Coordinator:  Loralee Pacas, [place] (o) or [phone] (c)  Information discussed with and copy given to patient by: Rana Snare, 03/09/2020, 4:54 PM

## 2020-03-09 NOTE — IPOC Note (Signed)
Individualized overall Plan of Care [facility]) Patient Details Name: Richard Hatfield MRN: [id number] DOB: 01-03-1956  Admitting Diagnosis: Anoxic brain injury [facility])  Hospital Problems: Principal Problem:   Anoxic brain injury ([place]) Active Problems:   Type 2 diabetes mellitus with complication, without long-term current use of insulin ([place])   Coronary artery disease involving native heart without angina pectoris   Transaminitis   NSVT (nonsustained ventricular tachycardia) ([place])     Functional Problem List: Nursing Behavior, Bladder, Bowel, Safety  PT Balance, Perception, Safety, Behavior, Sensory, Endurance, Motor, Pain  OT Balance, Behavior, Cognition, Endurance, Motor, Safety  SLP Cognition, Linguistic  TR         Basic ADL's: OT Grooming, Eating, Bathing, Dressing, Toileting     Advanced  ADL's: OT       Transfers: PT Bed Mobility, Car, Bed to Chair, [employer], Floor  OT Toilet, Tub/Shower     Locomotion: PT Ambulation, Stairs, Wheelchair Mobility     Additional Impairments: OT None  SLP Communication, Social Cognition expression Social Interaction, Problem Solving, Memory, Attention, Awareness  TR      Anticipated Outcomes Item Anticipated Outcome  Self Feeding supervision  Swallowing      Basic self-care  supervision  Toileting  supervision   Bathroom Transfers supervision  Bowel/Bladder  Patient will gain continence of bowel and bladder  Transfers  supervision with LRAD  Locomotion  supervision with LRAD except min assist 1 step with LRAD  Communication  Mod I  Cognition  Supervision  Pain  Patient will remain pain free  Safety/Judgment  Patient will remain free of falls and safely participate in therapy   Therapy Plan: PT Intensity: Minimum of 1-2 x/day ,45 to 90 minutes PT Frequency: 5 out of 7 days PT Duration Estimated Length of Stay: 10-12 days OT Intensity: Minimum of 1-2 x/day, 45 to 90 minutes OT Frequency: 5 out of 7 days OT  Duration/Estimated Length of Stay: 10-12 days SLP Intensity: Minumum of 1-2 x/day, 30 to 90 minutes SLP Frequency: 3 to 5 out of 7 days SLP Duration/Estimated Length of Stay: 10-12 days    Team Interventions: Nursing Interventions Patient/Family Education, Bladder Management, Cognitive Remediation/Compensation, Discharge Planning  PT interventions Ambulation/gait training, Community reintegration, DME/adaptive equipment instruction, Neuromuscular re-education, Psychosocial support, Wheelchair propulsion/positioning, Stair training, UE/LE Strength taining/ROM, UE/LE Coordination activities, Therapeutic Activities, Skin care/wound management, Pain management, Functional electrical stimulation, Discharge planning, Balance/vestibular training, Cognitive remediation/compensation, Disease management/prevention, Therapeutic Exercise, Splinting/orthotics, Patient/family education, Functional mobility training, Visual/perceptual remediation/compensation  OT Interventions Training and development officer, Cognitive remediation/compensation, Community reintegration, Discharge planning, Disease mangement/prevention, DME/adaptive equipment instruction, Functional mobility training, Pain management, Neuromuscular re-education, Patient/family education, Psychosocial support, Self Care/advanced ADL retraining, Splinting/orthotics, Therapeutic Activities, Therapeutic Exercise, UE/LE Strength taining/ROM, UE/LE Coordination activities  SLP Interventions Cognitive remediation/compensation, Internal/external aids, Speech/Language facilitation, Therapeutic Activities, Environmental controls, English as a second language teacher, Patient/family education, Functional tasks  TR Interventions    SW/CM Interventions Discharge Planning, Psychosocial Support, Patient/Family Education   Barriers to Discharge MD  Medical stability and Behavior  Nursing Incontinence, Behavior    PT      OT      SLP Decreased caregiver support, Lack of/limited family  support    SW       Team Discharge Planning: Destination: PT-Home ,OT- Home , SLP-Home Projected Follow-up: PT-24 hour supervision/assistance, Outpatient PT, OT-  Home health OT, SLP-Home Health SLP, Outpatient SLP, 24 hour supervision/assistance Projected Equipment Needs: PT-To be determined, OT- To be determined, SLP-None recommended by SLP Equipment Details: PT- , OT-  Patient/family  involved in discharge planning: PT- Patient, Family member/caregiver,  OT-Patient, SLP-Patient  MD ELOS: 10-12 days. Medical Rehab Prognosis:  Good Assessment: 64 year old male with history of HTN, T2DM, ETOH abuse who was admitted on 02/28/20 after cardiac arrest. Per reports, wife work up to husband who had jerking movements and difficulty breathing, activated EMS, performed CPR X 8 minutes followed by 12 minutes of CPR/ACLS protocol by EM for VT with ROSC. He was mildly responsive to sternal rubs and was intubated in ED. UDS negative except for benzo's. EKG showed mild ST changes and he underwent cardiac cath revealing significant diffuse CAD with with EF 45-50% and no acute thrombus. Cardiology felt that VT arrest due to transient LAD coronary spasm superimposed on multivessel CAD. He was extubated without difficulty on 07/15 bu has had issues with confusion and bout of agitation question due to ETOH withdrawal v/s delirium.   He was maintained on IV heparin and IV lopressor due to NSVT. He did receive dose of IV ativan on 07/17 with resultant hypoxia with bouts of apnea, obtundation and sluggish pupils.  Code stroke activated and neurology questioned stroke as cause of MS changes. CT head negative for bleed or acute changes. CTA head/perfusion study negative for LVO or ischemia. MRI brain showed T2 flare felt to be due to anoxic injury.   He was note felt to be a good candidate for PCI or CT surgery due to neurological deficits and medical treatment recommended. Statin held due to abnormal LFTs and amlodipine  added due to concerns of spasms.  Mentation improving and Dr. Orvan Seen evaluated patient yesterday and recommends continuing to monitor for neurological recovery prior to considering CABG. He continues to have occasional PVCs/couplets--BB decreased and BP improving with addition of ARB. LifeVest ordered and wife has been educated on use per reports. Patient continues to have deficits due to anoxic brain injury with cognitive deficits, balance deficits, decreased safety awareness and continued bouts of confusion with agitation. Will set goals for Supervision with PT/OT/SLP.   Due to the current state of emergency, patients may not be receiving their 3-hours of Medicare-mandated therapy.  See Team Conference Notes for weekly updates to the plan of care

## 2020-03-09 NOTE — Progress Notes (Signed)
Inpatient Rehabilitation  Patient information reviewed and entered into eRehab system by Brailynn Breth M. Diannie Willner, M.A., CCC/SLP, PPS Coordinator.  Information including medical coding, functional ability and quality indicators will be reviewed and updated through discharge.    

## 2020-03-09 NOTE — Evaluation (Signed)
Speech Language Pathology Assessment and Plan  Patient Details  Name: Richard Hatfield MRN: [id number] Date of Birth: August 23, 1955  SLP Diagnosis: Cognitive Impairments;Dysarthria  Rehab Potential: Excellent ELOS: 10-12 days    Today's Date: 03/09/2020 SLP Individual Time: 1000-1055 SLP Individual Time Calculation (min): 55 min   Hospital Problem: Principal Problem:   Anoxic brain injury ([place]) Active Problems:   Type 2 diabetes mellitus with complication, without long-term current use of insulin (HCC)   Coronary artery disease involving native heart without angina pectoris   Transaminitis   NSVT (nonsustained ventricular tachycardia) ([place])  Past Medical History:  Past Medical History:  Diagnosis Date  . Cardiac arrest ([place]) 02/28/2020  . Diabetes mellitus without complication ([place])   . Hypertension    Past Surgical History:  Past Surgical History:  Procedure Laterality Date  . LEFT HEART CATH AND CORONARY ANGIOGRAPHY N/A 02/28/2020   Procedure: LEFT HEART CATH AND CORONARY ANGIOGRAPHY;  Surgeon: Troy Sine, MD;  Location: [facility];  Service: Cardiovascular;  Laterality: N/A;    Assessment / Plan / Recommendation Clinical Impression Patient is a 64 year old male with history of HTN, T2DM, ETOH abuse who was admitted on 02/28/20 after cardiac arrest. Per reports, wife woke up to husband who had jerking movements and difficulty breathing, activated EMS, performed CPR X 8 minutes followed by 12 minutes of CPR/ACLS protocol by EM for VT with ROSC. He was mildly responsive to sternal rubs and was intubated in ED. EKG showed mild ST changes and he underwent cardiac cath revealing significant diffuse CAD with with EF 45-50% and no acute thrombus. Cardiology felt that VT arrest due to transient LAD coronary spasm superimposed on multivessel CAD. He was extubated without difficulty on 07/15 but has had issues with confusion and bout of agitation question due to ETOH withdrawal vs  delirium. He was maintained on IV heparin and IV lopressor due to NSVT. He did receive dose of IV ativan on 07/17 with resultant hypoxia with bouts of apnea, obtundation and sluggish pupils.  Code stroke activated and neurology questioned stroke as cause of MS changes. CT head negative for bleed or acute changes. CTA head/perfusion study negative for LVO or ischemia. MRI brain showed T2 flare felt to be due to anoxic injury.   He was note felt to be a good candidate for PCI or CT surgery due to neurological deficits and medical treatment recommended. Statin held due to abnormal LFTs and amlodipine added due to concerns of spasms.  Mentation improving and Dr. Orvan Seen evaluated patient yesterday and recommends continuing to monitor for neurological recovery prior to considering CABG. He continues to have occasional PVCs/couplets--BB decreased and BP improving with addition of ARB. LifeVest ordered yesterday and wife has been educated on use per reports. Patient continues to have deficits due to anoxic brain injury with cognitive deficits, balance deficits, decreased safety awareness and continued bouts of confusion with agitation. Therapy ongoing and CIR recommended due to functional decline. Patient admitted 03/08/20.  Patient demonstrates moderate-severe cognitive impairments impacting sustained attention, functional problem solving, short-term recall and intellectual awareness which impacts his safety with functional and familiar tasks. Patient also with intermittent language of confusion and confabulation at times that was easily redirected. SLP administered the Cognistat which showed only moderate deficits in short-term recall, however, deficits were more evident in conversation and functional tasks. Patient's speech intelligibility was also mildly impaired due to an increased speech rate. Patient would benefit from skilled SLP intervention to maximize his cognitive functioning  and speech intelligibility prior to  discharge.     Skilled Therapeutic Interventions          Administered a cognitive-linguistic evaluation, please see above for details. SLP provided a patient with a visual aid to maximize recall and carryover use of the call button to request assistance and reduce impulsivity. Patient left upright in the recliner with alarm on at the RN station. Continue with current plan of care.      SLP Assessment  Patient will need skilled [facility] during CIR admission    Recommendations  Oral Care Recommendations: Oral care BID Recommendations for Other Services: Neuropsych consult Patient destination: Home Follow up Recommendations: Home Health SLP;Outpatient SLP;24 hour supervision/assistance Equipment Recommended: None recommended by SLP    SLP Frequency 3 to 5 out of 7 days   SLP Duration  SLP Intensity  SLP Treatment/Interventions 10-12 days  Minumum of 1-2 x/day, 30 to 90 minutes  Cognitive remediation/compensation;Internal/external aids;Speech/Language facilitation;Therapeutic Activities;Environmental controls;Cueing hierarchy;Patient/family education;Functional tasks    Pain No/Denies Pain  SLP Evaluation Cognition Overall Cognitive Status: Impaired/Different from baseline Arousal/Alertness: Awake/alert Orientation Level: Oriented X4 (use newspaper on his lap) Attention: Focused;Sustained Focused Attention: Appears intact Sustained Attention: Impaired Sustained Attention Impairment: Verbal basic;Functional basic Memory: Impaired Memory Impairment: Retrieval deficit;Decreased recall of new information;Decreased short term memory Awareness: Impaired Awareness Impairment: Intellectual impairment Problem Solving: Impaired Problem Solving Impairment: Verbal basic;Functional basic Safety/Judgment: Impaired  Comprehension Auditory Comprehension Overall Auditory Comprehension: Appears within functional limits for tasks  assessed Expression Expression Primary Mode of Expression: Verbal Verbal Expression Overall Verbal Expression: Appears within functional limits for tasks assessed Oral Motor Oral Motor/Sensory Function Overall Oral Motor/Sensory Function: Within functional limits Motor Speech Overall Motor Speech: Impaired Respiration: Within functional limits Phonation: Normal Resonance: Within functional limits Articulation: Impaired Level of Impairment: Conversation Intelligibility: Intelligibility reduced Word: 75-100% accurate Phrase: 75-100% accurate Conversation: 75-100% accurate Motor Planning: Witnin functional limits Effective Techniques: Slow rate  Care Tool Care Tool Cognition Expression of Ideas and Wants Expression of Ideas and Wants: Some difficulty - exhibits some difficulty with expressing needs and ideas (e.g, some words or finishing thoughts) or speech is not clear   Understanding Verbal and Non-Verbal Content Understanding Verbal and Non-Verbal Content: Sometimes understands - understands only basic conversations or simple, direct phrases. Frequently requires cues to understand   Memory/Recall Ability *first 3 days only Memory/Recall Ability *first 3 days only: Current season;That he or she is in a hospital/hospital unit    Short Term Goals: Week 1: SLP Short Term Goal 1 (Week 1): Patient will recall new, daily information with Mod A verbal and visual cues. SLP Short Term Goal 2 (Week 1): Patient will demonstrate sustained attention to functional tasks for ~10 minutes with Mod A verbal cues for redirection SLP Short Term Goal 3 (Week 1): Patient will demonstrate functional problem solving for functional and familiar tasks with Mod A multimodal cues. SLP Short Term Goal 4 (Week 1): Patient will self-monitor and correct errors during functional tasks with Mod A verbal cues. SLP Short Term Goal 5 (Week 1): Patient will utilize a slow speech rate to maximize intelligibility to  ~100% at the conversation level with Mod verbal cues.  Refer to Care Plan for Long Term Goals  Recommendations for other services: Neuropsych  Discharge Criteria: Patient will be discharged from SLP if patient refuses treatment 3 consecutive times without medical reason, if treatment goals not met, if there is a change in medical status, if patient makes no progress  towards goals or if patient is discharged from hospital.  The above assessment, treatment plan, treatment alternatives and goals were discussed and mutually agreed upon: by patient  Alverto Shedd 03/09/2020, 12:23 PM

## 2020-03-09 NOTE — Progress Notes (Signed)
[facility] PHYSICAL MEDICINE & REHABILITATION PROGRESS NOTE  Subjective/Complaints: Patient seen sitting up in his chair this AM at the nurses station.  He states he slept fairly well overnight.  He did not sleep well per nursing.  ROS: Appears to deny CP, shortness of breath, nausea, vomiting, diarrhea.  Objective: Vital Signs: Blood pressure 127/79, pulse 55, temperature 98.3 F (36.8 C), resp. rate 16, height 5\' 8"  (1.727 m), weight 88.5 kg, SpO2 95 %. CT HEAD WO CONTRAST  Result Date: 03/08/2020 CLINICAL DATA:  Traumatic brain injury, change in neurologic status, history of anoxic brain injury EXAM: CT HEAD WITHOUT CONTRAST TECHNIQUE: Contiguous axial images were obtained from the base of the skull through the vertex without intravenous contrast. COMPARISON:  03/04/2020, 03/03/2020 FINDINGS: Brain: No acute infarct or hemorrhage. Lateral ventricles and midline structures are stable. No acute extra-axial fluid collections. No mass effect. Vascular: No hyperdense vessel or unexpected calcification. Skull: Normal. Negative for fracture or focal lesion. Sinuses/Orbits: Minimal mucosal thickening right maxillary sinus. Remaining sinuses are clear. Other: None. IMPRESSION: No acute intracranial process. Electronically Signed   By: Randa Ngo M.D.   On: 03/08/2020 00:37   Recent Labs    03/07/20 0612  WBC 10.4  HGB 14.8  HCT 43.6  PLT 237   Recent Labs    03/08/20 0931  NA 136  K 4.4  CL 104  CO2 23  GLUCOSE 290*  BUN 23  CREATININE 0.85  CALCIUM 8.9    Physical Exam: BP 127/79 (BP Location: Left Arm)   Pulse 55   Temp 98.3 F (36.8 C)   Resp 16   Ht 5\' 8"  (1.727 m)   Wt 88.5 kg   SpO2 95%   BMI 29.65 kg/m  Constitutional: No distress . Vital signs reviewed. HENT: Normocephalic.  Atraumatic. Eyes: EOMI. No discharge. Cardiovascular: No JVD. RRR.  + LifeVest. Respiratory: Normal effort.  No stridor. Bilaterally clear to auscultation.  GI: Non-distended. BS+.   Skin: Left arm ecchymosis Psych: Appears pleasantly confused. Musc: No edema in extremities.  No tenderness in extremities. Neuro: Alert and oriented except for date Motor: 5/5 grossly throughout  Assessment/Plan: 1. Functional deficits secondary to anoxic brain injury which require 3+ hours per day of interdisciplinary therapy in a comprehensive inpatient rehab setting.  Physiatrist is providing close team supervision and 24 hour management of active medical problems listed below.  Physiatrist and rehab team continue to assess barriers to discharge/monitor patient progress toward functional and medical goals  Care Tool:  Bathing    Body parts bathed by patient: Left arm, Right arm, Abdomen, Front perineal area, Buttocks, Right upper leg, Left upper leg, Face, Left lower leg, Right lower leg     Body parts n/a: Chest (pt has life vest)   Bathing assist Assist Level: Minimal Assistance - Patient > 75%     Upper Body Dressing/Undressing Upper body dressing   What is the patient wearing?: Pull over shirt    Upper body assist Assist Level: Minimal Assistance - Patient > 75%    Lower Body Dressing/Undressing Lower body dressing      What is the patient wearing?: Pants     Lower body assist Assist for lower body dressing: Minimal Assistance - Patient > 75%     Toileting Toileting    Toileting assist Assist for toileting: Moderate Assistance - Patient 50 - 74%     Transfers Chair/bed transfer  Transfers assist     Chair/bed transfer assist level: Moderate Assistance -  Patient 50 - 74%     Locomotion Ambulation   Ambulation assist              Walk 10 feet activity   Assist           Walk 50 feet activity   Assist           Walk 150 feet activity   Assist           Walk 10 feet on uneven surface  activity   Assist           Wheelchair     Assist               Wheelchair 50 feet with 2 turns  activity    Assist            Wheelchair 150 feet activity     Assist            Medical Problem List and Plan: 1.  Impaired function secondary to anoxic brain injury from prolonged CPR/STEMIsp cardiac arrest  Begin CIR evaluations 2.  Antithrombotics: -DVT/anticoagulation:  Pharmaceutical: Lovenox--will start             -antiplatelet therapy: On ASA/Plavix.  3. Pain Management: N/a 4. Mood: LCSW to follow for evaluation and support.              -antipsychotic agents: N/a 5. Neuropsych: This patient is not capable of making decisions on his own behalf.   Telesitter for safety  6. Skin/Wound Care: Monitor chest wall for healing.  7. Fluids/Electrolytes/Nutrition: Monitor I/Os. Added prosource for supplement and multivitamin. On soft diet  8. ACS with diffuse CAD: Treated medically with ASA/Plavix/Avapro/lopressor/crestor.  9. NSVT: Life vest to be placed prior to admission. On low dose lopressor bid.  Heart rate controlled on 7/23 10. T2DM with hyperglycemia: Hgb A1c- 8.2.  PTA was on Tresiba 90 units, novolog 8 units tidac, Jardince 25 mg and metformin 1000 mg/500 mg.   Added carb modified restrictions to meals   Resumed Jardiance   Monitor the increased mobility 11. Abnormal LFTs:   LFTs elevated on 7/22, labs pending 12. Indigestion/GERD- was on Prilosec 20 mg daily at home-  Ordered Ca Carbonate as well as Protonix 20 mg daily  LOS: 1 days A FACE TO FACE EVALUATION WAS PERFORMED  Cicilia Clinger Lorie Phenix 03/09/2020, 11:15 AM

## 2020-03-09 NOTE — Plan of Care (Signed)
  Problem: RH Balance Goal: LTG: Patient will maintain dynamic sitting balance (OT) Description: LTG:  Patient will maintain dynamic sitting balance with assistance during activities of daily living (OT) Flowsheets (Taken 03/09/2020 1259) LTG: Pt will maintain dynamic sitting balance during ADLs with: Supervision/Verbal cueing   Problem: Sit to Stand Goal: LTG:  Patient will perform sit to stand in prep for activites of daily living with assistance level (OT) Description: LTG:  Patient will perform sit to stand in prep for activites of daily living with assistance level (OT) Flowsheets (Taken 03/09/2020 1259) LTG: PT will perform sit to stand in prep for activites of daily living with assistance level: Supervision/Verbal cueing   Problem: RH Eating Goal: LTG Patient will perform eating w/assist, cues/equip (OT) Description: LTG: Patient will perform eating with assist, with/without cues using equipment (OT) Flowsheets (Taken 03/09/2020 1259) LTG: Pt will perform eating with assistance level of: Supervision/Verbal cueing   Problem: RH Grooming Goal: LTG Patient will perform grooming w/assist,cues/equip (OT) Description: LTG: Patient will perform grooming with assist, with/without cues using equipment (OT) Flowsheets (Taken 03/09/2020 1259) LTG: Pt will perform grooming with assistance level of: Supervision/Verbal cueing   Problem: RH Bathing Goal: LTG Patient will bathe all body parts with assist levels (OT) Description: LTG: Patient will bathe all body parts with assist levels (OT) Flowsheets (Taken 03/09/2020 1259) LTG: Pt will perform bathing with assistance level/cueing: Supervision/Verbal cueing   Problem: RH Dressing Goal: LTG Patient will perform upper body dressing (OT) Description: LTG Patient will perform upper body dressing with assist, with/without cues (OT). Flowsheets (Taken 03/09/2020 1259) LTG: Pt will perform upper body dressing with assistance level of: Supervision/Verbal  cueing Goal: LTG Patient will perform lower body dressing w/assist (OT) Description: LTG: Patient will perform lower body dressing with assist, with/without cues in positioning using equipment (OT) Flowsheets (Taken 03/09/2020 1259) LTG: Pt will perform lower body dressing with assistance level of: Supervision/Verbal cueing   Problem: RH Toileting Goal: LTG Patient will perform toileting task (3/3 steps) with assistance level (OT) Description: LTG: Patient will perform toileting task (3/3 steps) with assistance level (OT)  Flowsheets (Taken 03/09/2020 1259) LTG: Pt will perform toileting task (3/3 steps) with assistance level: Supervision/Verbal cueing   Problem: RH Toilet Transfers Goal: LTG Patient will perform toilet transfers w/assist (OT) Description: LTG: Patient will perform toilet transfers with assist, with/without cues using equipment (OT) Flowsheets (Taken 03/09/2020 1259) LTG: Pt will perform toilet transfers with assistance level of: Supervision/Verbal cueing   Problem: RH Tub/Shower Transfers Goal: LTG Patient will perform tub/shower transfers w/assist (OT) Description: LTG: Patient will perform tub/shower transfers with assist, with/without cues using equipment (OT) Flowsheets (Taken 03/09/2020 1259) LTG: Pt will perform tub/shower stall transfers with assistance level of: Supervision/Verbal cueing   Problem: RH Awareness Goal: LTG: Patient will demonstrate awareness during functional activites type of (OT) Description: LTG: Patient will demonstrate awareness during functional activites type of (OT) Flowsheets (Taken 03/09/2020 1259) LTG: Patient will demonstrate awareness during functional activites type of (OT): Supervision

## 2020-03-09 NOTE — Evaluation (Signed)
Physical Therapy Assessment and Plan  Patient Details  Name: Richard Hatfield MRN: [id number] Date of Birth: 1956/06/04  PT Diagnosis: Abnormality of gait, Cognitive deficits, Coordination disorder, Difficulty walking, Impaired cognition and Muscle weakness Rehab Potential: Good ELOS: 10-12 days   Today's Date: 03/09/2020 PT Individual Time: 7544-9201 PT Individual Time Calculation (min): 69 min    Hospital Problem: Principal Problem:   Anoxic brain injury ([place]) Active Problems:   Type 2 diabetes mellitus with complication, without long-term current use of insulin ([place])   Coronary artery disease involving native heart without angina pectoris   Transaminitis   NSVT (nonsustained ventricular tachycardia) ([place])   Past Medical History:  Past Medical History:  Diagnosis Date  . Cardiac arrest ([place]) 02/28/2020  . Diabetes mellitus without complication ([place])   . Hypertension    Past Surgical History:  Past Surgical History:  Procedure Laterality Date  . LEFT HEART CATH AND CORONARY ANGIOGRAPHY N/A 02/28/2020   Procedure: LEFT HEART CATH AND CORONARY ANGIOGRAPHY;  Surgeon: Troy Sine, MD;  Location: [facility];  Service: Cardiovascular;  Laterality: N/A;    Assessment & Plan Clinical Impression: Patient is a 64 y.o. year old male with history of HTN, T2DM, ETOH abuse who was admitted on 02/28/20 after cardiac arrest. Per reports, wife work up to husband who had jerking movements and difficulty breathing, activated EMS, performed CPR X 8 minutes followed by 12 minutes of CPR/ACLS protocol by EM for VT with ROSC. He was mildly responsive to sternal rubs and was intubated in ED. UDS negative except for benzo's. EKG showed mild ST changes and he underwent cardiac cath revealing significant diffuse CAD with with EF 45-50% and no acute thrombus. Cardiology felt that VT arrest due to transient LAD coronary spasm superimposed on multivessel CAD. He was extubated without difficulty on  07/15 bu has had issues with confusion and bout of agitation question due to ETOH withdrawal v/s delirium.   He was maintained on IV heparin and IV lopressor due to NSVT. He did receive dose of IV ativan on 07/17 with resultant hypoxia with bouts of apnea, obtundation and sluggish pupils.  Code stroke activated and neurology questioned stroke as cause of MS changes. CT head negative for bleed or acute changes. CTA head/perfusion study negative for LVO or ischemia. MRI brain showed T2 flare felt to be due to anoxic injury.   He was note felt to be a good candidate for PCI or CT surgery due to neurological deficits and medical treatment recommended. Statin held due to abnormal LFTs and amlodipine added due to concerns of spasms.  Mentation improving and Dr. Orvan Seen evaluated patient yesterday and recommends continuing to monitor for neurological recovery prior to considering CABG. He continues to have occasional PVCs/couplets--BB decreased and BP improving with addition of ARB. LifeVest ordered yesterday and wife has been educated on use per reports. Patient continues to have deficits due to anoxic brain injury with cognitive deficits, balance deficits, decreased safety awareness and continued bouts of confusion with agitation (earlier this am per nurse). Therapy ongoing and CIR recommended due to functional decline. .  Patient transferred to CIR on 03/08/2020 .   Patient currently requires min with mobility secondary to muscle weakness, decreased cardiorespiratoy endurance, decreased coordination, decreased visual perceptual skills, decreased attention to left, decreased attention, decreased awareness, decreased problem solving, decreased safety awareness, decreased memory and delayed processing, and decreased standing balance, decreased postural control and decreased balance strategies.  Prior to hospitalization, patient was independent  with  mobility and lived with Spouse in a House home.  Home access is 1Stairs  to enter.  Patient will benefit from skilled PT intervention to maximize safe functional mobility, minimize fall risk and decrease caregiver burden for planned discharge home with 24 hour supervision.  Anticipate patient will benefit from follow up OP at discharge.  PT - End of Session Activity Tolerance: Tolerates 30+ min activity with multiple rests Endurance Deficit: Yes Endurance Deficit Description: generalized deconditioning PT Assessment Rehab Potential (ACUTE/IP ONLY): Good PT Patient demonstrates impairments in the following area(s): Balance;Perception;Safety;Behavior;Sensory;Endurance;Motor;Pain PT Transfers Functional Problem(s): Bed Mobility;Car;Bed to Chair;Furniture;Floor PT Locomotion Functional Problem(s): Ambulation;Stairs;Wheelchair Mobility PT Plan PT Intensity: Minimum of 1-2 x/day ,45 to 90 minutes PT Frequency: 5 out of 7 days PT Duration Estimated Length of Stay: 10-12 days PT Treatment/Interventions: Ambulation/gait training;Community reintegration;DME/adaptive equipment instruction;Neuromuscular re-education;Psychosocial support;Wheelchair propulsion/positioning;Stair training;UE/LE Strength taining/ROM;UE/LE Coordination activities;Therapeutic Activities;Skin care/wound management;Pain management;Functional electrical stimulation;Discharge planning;Balance/vestibular training;Cognitive remediation/compensation;Disease management/prevention;Therapeutic Exercise;Splinting/orthotics;Patient/family education;Functional mobility training;Visual/perceptual remediation/compensation PT Transfers Anticipated Outcome(s): supervision with LRAD PT Locomotion Anticipated Outcome(s): supervision with LRAD except min assist 1 step with LRAD PT Recommendation Recommendations for Other Services: Neuropsych consult;Speech consult Follow Up Recommendations: 24 hour supervision/assistance;Outpatient PT Patient destination: Home Equipment Recommended: To be determined   PT  Evaluation Precautions/Restrictions Precautions Precautions: Fall Precaution Comments: life vest Restrictions Weight Bearing Restrictions: No   General Chart Reviewed: Yes Additional Pertinent History: HTN, DM2, alcohol abuse Response to Previous Treatment: Patient with no complaints from previous session. Family/Caregiver Present:  (wife present at beginning of session but then left)   Vital Signs after gait: HR = 62 bpm SpO2 = 98% on room air  Home Living/Prior Functioning Home Living Available Help at Discharge: Family;Available 24 hours/day Type of Home: House Home Access: Stairs to enter [employer] of Steps: 1 Entrance Stairs-Rails: None Home Layout: Two level;Able to live on main level with bedroom/bathroom  Lives With: Spouse Prior Function Level of Independence: Independent with transfers;Independent with gait;Independent with homemaking with ambulation;Independent with basic ADLs  Able to Take Stairs?: Yes Driving: Yes Vocation: Full time employment Vocation RequirementsBiomedical scientist with [employer] Leisure: Hobbies-yes (Comment) Comments: has cats, enjoys gardening   Vision/Perception  Pt unable to provide accurate info re: baseline vision. No glasses in room. Pt does demonstrate decreased attention to L environment in functional context.  Cognition Overall Cognitive Status: Impaired/Different from baseline Arousal/Alertness: Awake/alert Attention: Focused;Sustained Focused Attention: Appears intact Sustained Attention: Impaired Sustained Attention Impairment: Verbal basic;Functional basic Memory: Impaired Memory Impairment: Retrieval deficit;Decreased recall of new information;Decreased short term memory Awareness: Impaired Awareness Impairment: Intellectual impairment Problem Solving: Impaired Problem Solving Impairment: Verbal basic;Functional basic Safety/Judgment: Impaired   Sensation Sensation Light Touch: Appears Intact Proprioception: Not  tested Coordination Gross Motor Movements are Fluid and Coordinated: No Fine Motor Movements are Fluid and Coordinated: No  Motor  Motor Motor: Abnormal postural alignment and control Motor - Skilled Clinical Observations: generalized weakness   Trunk/Postural Assessment  Postural Control Postural Control: Deficits on evaluation Righting Reactions: delayed Protective Responses: delayed   Balance Balance Balance Assessed: Yes Dynamic Standing Balance Dynamic Standing - Balance Support: During functional activity;No upper extremity supported Dynamic Standing - Level of Assistance: 4: Min assist (gait without AD)   Extremity Assessment  RUE Assessment RUE Assessment: Exceptions to [facility] General Strength Comments: generalized weakness 4/5 LUE Assessment LUE Assessment: Exceptions to [facility] General Strength Comments: generalized weakness 4/5 RLE Assessment General Strength Comments: not formally assessed, grossly 3+/5 as pt able to weight bear without buckling LLE Assessment General  Strength Comments: not formally assessed, grossly 3+/5 as pt able to weight bear without buckling  Care Tool Care Tool Bed Mobility Roll left and right activity   Roll left and right assist level: Contact Guard/Touching assist    Sit to lying activity   Sit to lying assist level: Contact Guard/Touching assist    Lying to sitting edge of bed activity   Lying to sitting edge of bed assist level: Contact Guard/Touching assist     Care Tool Transfers Sit to stand transfer   Sit to stand assist level: Minimal Assistance - Patient > 75%    Chair/bed transfer   Chair/bed transfer assist level: Minimal Assistance - Patient > 75%     Toilet transfer   Assist Level: Moderate Assistance - Patient 50 - 74%    Car transfer   Car transfer assist level: Minimal Assistance - Patient > 75%      Care Tool Locomotion Ambulation   Assist level: Minimal Assistance - Patient > 75% Assistive device: No  Device Max distance: 200 ft  Walk 10 feet activity   Assist level: Minimal Assistance - Patient > 75% Assistive device: No Device   Walk 50 feet with 2 turns activity   Assist level: Minimal Assistance - Patient > 75% Assistive device: No Device  Walk 150 feet activity   Assist level: Minimal Assistance - Patient > 75% Assistive device: No Device  Walk 10 feet on uneven surfaces activity   Assist level: Minimal Assistance - Patient > 75% Assistive device:  (none)  Stairs   Assist level: Minimal Assistance - Patient > 75% Stairs assistive device: 2 hand rails Max number of stairs: 12  Walk up/down 1 step activity   Walk up/down 1 step (curb) assist level: Minimal Assistance - Patient > 75% Walk up/down 1 step or curb assistive device: 2 hand rails    Walk up/down 4 steps activity Walk up/down 4 steps assist level: Minimal Assistance - Patient > 75% Walk up/down 4 steps assistive device: 2 hand rails  Walk up/down 12 steps activity   Walk up/down 12 steps assist level: Minimal Assistance - Patient > 75% Walk up/down 12 steps assistive device: 2 hand rails  Pick up small objects from floor   Pick up small object from the floor assist level: Minimal Assistance - Patient > 75%    Wheelchair Will patient use wheelchair at discharge?:  (potentially for community mobility) Type of Wheelchair: Manual   Wheelchair assist level: Minimal Assistance - Patient > 75% Max wheelchair distance: 200 ft  Wheel 50 feet with 2 turns activity   Assist Level: Minimal Assistance - Patient > 75%  Wheel 150 feet activity   Assist Level: Minimal Assistance - Patient > 75%    Refer to Care Plan for Long Term Goals  SHORT TERM GOAL WEEK 1 PT Short Term Goal 1 (Week 1): Pt will negotiate 1 step without rails with LRAD & mod assist. PT Short Term Goal 2 (Week 1): Pt will ambulate 75 ft with LRAD & supervision. PT Short Term Goal 3 (Week 1): Pt will demonstrate ability for day to day recall/carry over  during functional activities with mod assist.  Recommendations for other services: Neuropsych  Skilled Therapeutic Intervention Pt received in recliner with wife [name]) present in room. Educated pt & wife on ELOS per OT & SLP, daily therapy schedule, weekly team meetings, and other various CIR information. [name] then exited session. Pt completes sit<>stand transfers with min assist, gait without AD  x 200 ft with min assist with inconsistent step length & width (R>L) & decreased reciprocal arm swing. Pt negotiates ramp & mulch without AD & min assist, completes car transfer at small SUV simulated height with min assist & cuing to sit then place BLE in/out of car. Pt completes bed mobilty on mat table with CGA with pt reporting soreness in chest 2/2 compressions & reports soreness goes away once sitting after completing task. Pt retreives object from floor with min assist & completes stand pivot transfers with min assist. Pt negotiates 12 steps (6" + 3") with B rails & min assist with pt self selecting gait pattern. Pt propels w/c with BUE & min assist as pt frequently veering to L. Pt utilized nu-step on level 3 x 10 minutes with all four extremities with task focusing on global strengthening & endurance training & coordination of reciprocal movements. Pt requires max assist to recall set duration of task but is then able to calculate remaining minutes. At end of session pt left in recliner with chair alarm donned, BLE elevated, call bell in reach & telesitter in room.   Mobility Bed Mobility Bed Mobility: Rolling Left;Supine to Sit;Rolling Right;Sit to Supine Rolling Right: Contact Guard/Touching assist Rolling Left: Contact Guard/Touching assist Supine to Sit: Contact Guard/Touching assist Sit to Supine: Contact Guard/Touching assist Transfers Transfers: Sit to Stand;Stand to Sit;Stand Pivot Transfers Sit to Stand: Minimal Assistance - Patient > 75% Stand to Sit: Minimal Assistance - Patient >  75% Stand Pivot Transfers: Moderate Assistance - Patient 50 - 74% Stand Pivot Transfer Details: Tactile cues for placement;Tactile cues for sequencing Transfer (Assistive device): None Locomotion  Gait Ambulation: Yes Gait Assistance: Minimal Assistance - Patient > 75% Gait Distance (Feet): 200 Feet Assistive device: None Gait Assistance Details: Tactile cues for weight shifting Gait Gait: Yes Gait Pattern: Decreased step length - right;Decreased step length - left;Decreased stride length Gait velocity: decreased Stairs / Additional Locomotion Stairs: Yes Stairs Assistance: Minimal Assistance - Patient > 75% Stair Management Technique: Two rails Number of Stairs: 12 Height of Stairs:  (6" + 3") Ramp: Minimal Assistance - Patient >75% (ambulatory without AD) Wheelchair Mobility Wheelchair Mobility: Yes Wheelchair Assistance: Minimal assistance - Patient >75% Wheelchair Propulsion: Both upper extremities Wheelchair Parts Management: Needs assistance Distance: 200 ft   Discharge Criteria: Patient will be discharged from PT if patient refuses treatment 3 consecutive times without medical reason, if treatment goals not met, if there is a change in medical status, if patient makes no progress towards goals or if patient is discharged from hospital.  The above assessment, treatment plan, treatment alternatives and goals were discussed and mutually agreed upon: by patient and by family  [place] 03/09/2020, 4:28 PM

## 2020-03-09 NOTE — Progress Notes (Signed)
CARDIAC REHAB PHASE I   Cardiac Rehab phase 2 order placed to meet guidelines. Will follow if pt returns for surgery.   Rufina Falco, RN BSN 03/09/2020 7:08 AM

## 2020-03-10 DIAGNOSIS — D72829 Elevated white blood cell count, unspecified: Secondary | ICD-10-CM

## 2020-03-10 DIAGNOSIS — R7401 Elevation of levels of liver transaminase levels: Secondary | ICD-10-CM

## 2020-03-10 DIAGNOSIS — E118 Type 2 diabetes mellitus with unspecified complications: Secondary | ICD-10-CM

## 2020-03-10 DIAGNOSIS — I472 Ventricular tachycardia: Secondary | ICD-10-CM

## 2020-03-10 DIAGNOSIS — N179 Acute kidney failure, unspecified: Secondary | ICD-10-CM

## 2020-03-10 LAB — GLUCOSE, CAPILLARY
Glucose-Capillary: 157 mg/dL — ABNORMAL HIGH (ref 70–99)
Glucose-Capillary: 187 mg/dL — ABNORMAL HIGH (ref 70–99)
Glucose-Capillary: 256 mg/dL — ABNORMAL HIGH (ref 70–99)
Glucose-Capillary: 279 mg/dL — ABNORMAL HIGH (ref 70–99)

## 2020-03-10 NOTE — Progress Notes (Signed)
Patient appears to be asleep and resting w/o distress or discomfort, respiration even and unlabored, coloration adequate, Tele- monitoring continue, bed alarm and call bell in place.

## 2020-03-10 NOTE — Progress Notes (Signed)
[place] PHYSICAL MEDICINE & REHABILITATION PROGRESS NOTE  Subjective/Complaints: Patient seen sitting up in bed this morning.  He states he slept well overnight.  No reported issues per nursing.  ROS: Denies CP, shortness of breath, nausea, vomiting, diarrhea.  Objective: Vital Signs: Blood pressure (!) 156/111, pulse 65, temperature 98.4 F (36.9 C), resp. rate 16, height 5\' 8"  (1.727 m), weight 88.5 kg, SpO2 99 %. No results found. Recent Labs    03/09/20 1501  WBC 11.4*  HGB 14.2  HCT 42.1  PLT 244   Recent Labs    03/08/20 0931 03/09/20 1501  NA 136 134*  K 4.4 4.5  CL 104 101  CO2 23 23  GLUCOSE 290* 259*  BUN 23 29*  CREATININE 0.85 1.53*  CALCIUM 8.9 9.4    Physical Exam: BP (!) 156/111 (BP Location: Right Arm)   Pulse 65   Temp 98.4 F (36.9 C)   Resp 16   Ht 5\' 8"  (1.727 m)   Wt 88.5 kg   SpO2 99%   BMI 29.65 kg/m  Constitutional: No distress . Vital signs reviewed. HENT: Normocephalic.  Atraumatic. Eyes: EOMI. No discharge. Cardiovascular: No JVD.  RRR.  + LifeVest. Respiratory: Normal effort.  No stridor.  Bilaterally clear to auscultation.  GI: Non-distended. BS +. Skin: Warm and dry.  Intact. Psych: Normal mood.  Normal behavior.  Pleasant. Musc: No edema in extremities.  No tenderness in extremities. Neuro: Alert and oriented, except for date of month  Motor: 5/5 grossly throughout, unchanged  Assessment/Plan: 1. Functional deficits secondary to anoxic brain injury which require 3+ hours per day of interdisciplinary therapy in a comprehensive inpatient rehab setting.  Physiatrist is providing close team supervision and 24 hour management of active medical problems listed below.  Physiatrist and rehab team continue to assess barriers to discharge/monitor patient progress toward functional and medical goals  Care Tool:  Bathing    Body parts bathed by patient: Left arm, Right arm, Abdomen, Front perineal area, Buttocks, Right upper  leg, Left upper leg, Face, Left lower leg, Right lower leg     Body parts n/a: Chest (pt has life vest)   Bathing assist Assist Level: Minimal Assistance - Patient > 75%     Upper Body Dressing/Undressing Upper body dressing   What is the patient wearing?: Pull over shirt    Upper body assist Assist Level: Minimal Assistance - Patient > 75%    Lower Body Dressing/Undressing Lower body dressing      What is the patient wearing?: Pants     Lower body assist Assist for lower body dressing: Minimal Assistance - Patient > 75%     Toileting Toileting    Toileting assist Assist for toileting: Minimal Assistance - Patient > 75%     Transfers Chair/bed transfer  Transfers assist     Chair/bed transfer assist level: Minimal Assistance - Patient > 75%     Locomotion Ambulation   Ambulation assist      Assist level: Minimal Assistance - Patient > 75% Assistive device: No Device Max distance: 200 ft   Walk 10 feet activity   Assist     Assist level: Minimal Assistance - Patient > 75% Assistive device: No Device   Walk 50 feet activity   Assist    Assist level: Minimal Assistance - Patient > 75% Assistive device: No Device    Walk 150 feet activity   Assist    Assist level: Minimal Assistance - Patient > 75% Assistive  device: No Device    Walk 10 feet on uneven surface  activity   Assist     Assist level: Minimal Assistance - Patient > 75% Assistive device:  (none)   Wheelchair     Assist Will patient use wheelchair at discharge?:  (potentially for community mobility) Type of Wheelchair: Manual    Wheelchair assist level: Minimal Assistance - Patient > 75% Max wheelchair distance: 200 ft    Wheelchair 50 feet with 2 turns activity    Assist        Assist Level: Minimal Assistance - Patient > 75%   Wheelchair 150 feet activity     Assist     Assist Level: Minimal Assistance - Patient > 75%      Medical  Problem List and Plan: 1.  Impaired function secondary to anoxic brain injury from prolonged CPR/STEMIsp cardiac arrest  Continue CIR 2.  Antithrombotics: -DVT/anticoagulation:  Pharmaceutical: Lovenox--will start             -antiplatelet therapy: On ASA/Plavix.  3. Pain Management: N/a 4. Mood: LCSW to follow for evaluation and support.              -antipsychotic agents: N/a 5. Neuropsych: This patient is not capable of making decisions on his own behalf.   Telesitter for safety  6. Skin/Wound Care: Monitor chest wall for healing.  7. Fluids/Electrolytes/Nutrition: Monitor I/Os. Added prosource for supplement and multivitamin. On soft diet  8. ACS with diffuse CAD: Treated medically with ASA/Plavix/Avapro/lopressor/crestor.  9. NSVT: Life vest to be placed prior to admission. On low dose lopressor bid.  Heart rate controlled on 7/24 10. T2DM with hyperglycemia: Hgb A1c- 8.2.  PTA was on Tresiba 90 units, novolog 8 units tidac, Jardince 25 mg and metformin 1000 mg/500 mg.   Added carb modified restrictions to meals   Resumed Jardiance   Remains elevated on 7/4, will consider restarting other medications if persistently elevated  Monitor the increased mobility 11. Abnormal LFTs:   LFTs elevated on 7/23, labs ordered for Monday 12. Indigestion/GERD- was on Prilosec 20 mg daily at home-  Ordered Ca Carbonate as well as Protonix 20 mg daily 13.  Labile blood pressure  Labile on 7/24, monitor trend 14.  AKI  Creatinine 1.53 on 7/23, labs ordered for Monday  Encourage fluids 15.  Leukocytosis  WBCs 11.4 on 7/23, labs ordered for Monday  Afebrile  LOS: 2 days A FACE TO FACE EVALUATION WAS PERFORMED  Armani Brar Lorie Phenix 03/10/2020, 3:19 PM

## 2020-03-11 ENCOUNTER — Inpatient Hospital Stay (HOSPITAL_COMMUNITY): Payer: BC Managed Care – PPO

## 2020-03-11 ENCOUNTER — Inpatient Hospital Stay (HOSPITAL_COMMUNITY): Payer: BC Managed Care – PPO | Admitting: Speech Pathology

## 2020-03-11 DIAGNOSIS — R0989 Other specified symptoms and signs involving the circulatory and respiratory systems: Secondary | ICD-10-CM

## 2020-03-11 DIAGNOSIS — I2510 Atherosclerotic heart disease of native coronary artery without angina pectoris: Secondary | ICD-10-CM

## 2020-03-11 DIAGNOSIS — E1165 Type 2 diabetes mellitus with hyperglycemia: Secondary | ICD-10-CM

## 2020-03-11 LAB — GLUCOSE, CAPILLARY
Glucose-Capillary: 171 mg/dL — ABNORMAL HIGH (ref 70–99)
Glucose-Capillary: 281 mg/dL — ABNORMAL HIGH (ref 70–99)
Glucose-Capillary: 267 mg/dL — ABNORMAL HIGH (ref 70–99)
Glucose-Capillary: 305 mg/dL — ABNORMAL HIGH (ref 70–99)

## 2020-03-11 MED ORDER — INSULIN ASPART 100 UNIT/ML ~~LOC~~ SOLN
8.0000 [IU] | Freq: Three times a day (TID) | SUBCUTANEOUS | Status: DC
  Administered 2020-03-11 – 2020-03-20 (×26): 8 [IU] via SUBCUTANEOUS

## 2020-03-11 NOTE — Progress Notes (Signed)
Occupational Therapy Session Note  Patient Details  Name: Richard Hatfield MRN: [id number] Date of Birth: 02/25/1956  Today's Date: 03/11/2020 OT Individual Time: 0815-0920 OT Individual Time Calculation (min): 65 min    Short Term Goals: Week 1:  OT Short Term Goal 1 (Week 1): Pt will complete LB dressing with min A OT Short Term Goal 2 (Week 1): Pt will maintain standing balance at the sink within BADL task with CGA OT Short Term Goal 3 (Week 1): Pt will tolerate standing activity for 5 mins in preparation for BADL tasks  Skilled Therapeutic Interventions/Progress Updates:   Pt received supine with RN present administering medication. Lifevest on for entire session. Reviewed orientation with pt- incorrect day of the week but correct on all year, place, month. Pt with obvious memory deficits, repeating himself frequently. Pt completed bed mobility to EOB with (S). He stood from EOB with CGA. Pt used RW to complete ambulatory transfer to the w/c with min A. Min redirection to task throughout session, pt quite verbose.  Pt completed oral care with set up assist. Set up assist for UB ADLs. Pt stood to wash LB and then reported need to use bathroom. Pt able to remove all LB in standing with UE support on the sink. Pt required cueing for safety awareness, attempting to start walking before walker was obtained. Pt used RW for transfer with CGA. Pt transferred onto toilet with CGA. Pt voided BM and stood to complete peri hygiene with CGA. Pt ambulated back to the w/c with CGA, no AD. Pt able to don underwear and pants with CGA, threading belt in standing. Pt completed 100 ft of functional mobility with (S) using rollator, SOB following. After 1 min seated rest break pt HR was 64 and SpO2 100%. Pt wanted to weigh himself on the standing scale and was 188.5. Pt completed another 50 ft of functional mobility without any AD. Pulse oximeter giving inconsistent reading, initially reading 150 bpm and then after  switching finger, it quickly dropped to 80 bpm. Pt reporting no pain throughout session. Pt returned to his room and was left sitting up in the recliner with all needs met, chair belt fastened.   Therapy Documentation Precautions:  Precautions Precautions: Fall Precaution Comments: life vest Restrictions Weight Bearing Restrictions: No  Therapy/Group: Individual Therapy   H  03/11/2020, 7:11 AM  

## 2020-03-11 NOTE — Progress Notes (Signed)
Physical Therapy Session Note  Patient Details  Name: Richard Hatfield MRN: [id number] Date of Birth: July 22, 1956  Today's Date: 03/11/2020 PT Individual Time: 1350-1500 PT Individual Time Calculation (min): 70 min   Short Term Goals: Week 1:  PT Short Term Goal 1 (Week 1): Pt will negotiate 1 step without rails with LRAD & mod assist. PT Short Term Goal 2 (Week 1): Pt will ambulate 75 ft with LRAD & supervision. PT Short Term Goal 3 (Week 1): Pt will demonstrate ability for day to day recall/carry over during functional activities with mod assist.  Skilled Therapeutic Interventions/Progress Updates:     Patient in recliner with his wife in the room upon PT arrival. Patient alert and agreeable to PT session. Patient denied pain during session. Patient's wife brought in a rollator for the patient to trial per discussion with previous PT. Trialed rollator throughout session.   Therapeutic Activity: Transfers: Patient performed sit to/from stand x5 with supervision for safety. Provided verbal cues for locking breaks, hand placement, and reaching back to sit for safety with use of a rollator. Improved technique following NMR, see below, however, continues to require min cues for safety. Patient was continent of bladder during session, voided standing over the toilet with CGA for safety/balance and performed peri-care, LB clothing management, and hand washing independently.   Gait Training:  Patient ambulated >150 feet and >200 ftusing rollator with CGA for safety. Ambulated with step-through gait pattern, mild posterior bias without LOB, decreased gait speed, and decreased step length and height. Provided verbal cues for mild forward weight shift, increased step height for safety, and encouraged use of rollator seat if needed for energy conservation. Used seat x1 appropriately during gait training due to fatigue.  Patient ascended/descended 4 steps using 2 rails x1 and 1 rail with B UE support x1 with  CGA-min A for balance. Performed step-to gait pattern leading with L while ascending and R while descending per patient's preference. Provided cues and demonstration for technique and sequencing.   Neuromuscular Re-ed: Patient performed the following activities for improved motor planning, recall, and safety with functional mobility: -blocked practice sit to/from stand using rollator 2x5 with decreased cues throughout for locking breaks, hand placement, and reaching back to sit for safe use of rollator  -ambulating with rollator weaving between 8 cones x4 for improved turning and management of rollator, required CGA throughout and his 1 cone throughout  Patient required increased time and rest breaks due to decreased activity tolerance. HR remained between 61-64 throughout session. Mild SOB after gait and stair training, patient recovered <30 sec of sitting rest with cues for pursed lip breathing. SPO2 97-100%.   Discussed home set-up and d/c planning with patient and his wife. Plan to have patient stay downstairs with hopes to progress to being able to use the stairs to decorate for Christmas this year. Has 4-5 steps in the front with 1-2 rails and 2 steps in the back with 1 rail. Asking about rollator versus RW. PT educated on importance of safety cues for use of rollator and would like to see consistent recall of cues and demonstration of safe use of rollator before selecting this as primary AD. Will follow-up with lead PT about DME recommendations.   Patient in recliner with his wife in the room at end of session with breaks locked, seat belt alarm set, and all needs within reach.    Therapy Documentation Precautions:  Precautions Precautions: Fall Precaution Comments: life vest Restrictions Weight Bearing Restrictions: No  Therapy/Group: Individual Therapy  Maxene Byington L Demico Ploch PT, DPT  03/11/2020, 5:00 PM

## 2020-03-11 NOTE — Progress Notes (Signed)
Speech Language Pathology Daily Session Note  Patient Details  Name: Richard Hatfield MRN: [id number] Date of Birth: [birth year]  Today's Date: 03/11/2020 SLP Individual Time: 1005-1100 SLP Individual Time Calculation (min): 55 min  Short Term Goals: Week 1: SLP Short Term Goal 1 (Week 1): Patient will recall new, daily information with Mod A verbal and visual cues. SLP Short Term Goal 2 (Week 1): Patient will demonstrate sustained attention to functional tasks for ~10 minutes with Mod A verbal cues for redirection SLP Short Term Goal 3 (Week 1): Patient will demonstrate functional problem solving for functional and familiar tasks with Mod A multimodal cues. SLP Short Term Goal 4 (Week 1): Patient will self-monitor and correct errors during functional tasks with Mod A verbal cues. SLP Short Term Goal 5 (Week 1): Patient will utilize a slow speech rate to maximize intelligibility to ~100% at the conversation level with Mod verbal cues.  Skilled Therapeutic Interventions:   Pt was seen for skilled ST targeting cognitive goals.  Upon arrival, pt was seated in recliner with chair alarm set, telesitter in place.  Pt was oriented to situation and month but his verbal output was tangential and at times confabulatory (ie. wife was going to [place] to see the Olympics this afternoon).  Despite being oriented to general date and situation, pt had poor recall of his timeline of hospital events, stating that he had been hospitalized for "2 years" due to "Covid" prior to his cardiac arrest on the 13th of this month.  As a result, SLP initiated a memory notebook which included a timeline of hospital events and summary of today's therapy appointments.  Pt was encouraged to record daily information into notebook to facilitate carryover in between therapy sessions.  Pt was left sitting in recliner with chair alarm set and call bell within reach.  Continue per current plan of care.    Pain Pain Assessment Pain Scale:  0-10 Pain Score: 0-No pain  Therapy/Group: Individual Therapy  Boysie Bonebrake, Selinda Orion 03/11/2020, 12:19 PM

## 2020-03-11 NOTE — Progress Notes (Signed)
[facility] PHYSICAL MEDICINE & REHABILITATION PROGRESS NOTE  Subjective/Complaints: Patient seen sitting up in bed this morning.  He states he slept well overnight.  He denies complaints.  No reported issues overnight.  ROS: Denies CP, shortness of breath, nausea, vomiting, diarrhea.  Objective: Vital Signs: Blood pressure (!) 110/61, pulse 58, temperature 98 F (36.7 C), temperature source Oral, resp. rate 18, height 5\' 8"  (1.727 m), weight 88.5 kg, SpO2 99 %. No results found. Recent Labs    03/09/20 1501  WBC 11.4*  HGB 14.2  HCT 42.1  PLT 244   Recent Labs    03/09/20 1501  NA 134*  K 4.5  CL 101  CO2 23  GLUCOSE 259*  BUN 29*  CREATININE 1.53*  CALCIUM 9.4    Physical Exam: BP (!) 110/61 (BP Location: Right Arm)   Pulse 58   Temp 98 F (36.7 C) (Oral)   Resp 18   Ht 5\' 8"  (1.727 m)   Wt 88.5 kg   SpO2 99%   BMI 29.65 kg/m   Constitutional: No distress . Vital signs reviewed. HENT: Normocephalic.  Atraumatic. Eyes: EOMI. No discharge. Cardiovascular: No JVD.  RRR.  + Left wrist. Respiratory: Normal effort.  No stridor.  Bilateral clear to auscultation. GI: Non-distended.  BS +. Skin: Warm and dry.  Intact. Psych: Normal mood.  Normal behavior. Musc: No edema in extremities.  No tenderness in extremities. Neuro: Alert Motor: 5/5 grossly throughout, stable  Assessment/Plan: 1. Functional deficits secondary to anoxic brain injury which require 3+ hours per day of interdisciplinary therapy in a comprehensive inpatient rehab setting.  Physiatrist is providing close team supervision and 24 hour management of active medical problems listed below.  Physiatrist and rehab team continue to assess barriers to discharge/monitor patient progress toward functional and medical goals  Care Tool:  Bathing    Body parts bathed by patient: Left arm, Right arm, Abdomen, Front perineal area, Buttocks, Right upper leg, Left upper leg, Face, Left lower leg, Right lower  leg     Body parts n/a: Chest (pt has life vest)   Bathing assist Assist Level: Minimal Assistance - Patient > 75%     Upper Body Dressing/Undressing Upper body dressing   What is the patient wearing?: Pull over shirt    Upper body assist Assist Level: Minimal Assistance - Patient > 75%    Lower Body Dressing/Undressing Lower body dressing      What is the patient wearing?: Pants     Lower body assist Assist for lower body dressing: Minimal Assistance - Patient > 75%     Toileting Toileting    Toileting assist Assist for toileting: Minimal Assistance - Patient > 75%     Transfers Chair/bed transfer  Transfers assist     Chair/bed transfer assist level: Minimal Assistance - Patient > 75%     Locomotion Ambulation   Ambulation assist      Assist level: Minimal Assistance - Patient > 75% Assistive device: No Device Max distance: 200 ft   Walk 10 feet activity   Assist     Assist level: Minimal Assistance - Patient > 75% Assistive device: No Device   Walk 50 feet activity   Assist    Assist level: Minimal Assistance - Patient > 75% Assistive device: No Device    Walk 150 feet activity   Assist    Assist level: Minimal Assistance - Patient > 75% Assistive device: No Device    Walk 10 feet on uneven  surface  activity   Assist     Assist level: Minimal Assistance - Patient > 75% Assistive device:  (none)   Wheelchair     Assist Will patient use wheelchair at discharge?:  (potentially for community mobility) Type of Wheelchair: Manual    Wheelchair assist level: Minimal Assistance - Patient > 75% Max wheelchair distance: 200 ft    Wheelchair 50 feet with 2 turns activity    Assist        Assist Level: Minimal Assistance - Patient > 75%   Wheelchair 150 feet activity     Assist     Assist Level: Minimal Assistance - Patient > 75%      Medical Problem List and Plan: 1.  Impaired function secondary to  anoxic brain injury from prolonged CPR/STEMIsp cardiac arrest  Continue CIR 2.  Antithrombotics: -DVT/anticoagulation:  Pharmaceutical: Lovenox--will start             -antiplatelet therapy: On ASA/Plavix.  3. Pain Management: N/a 4. Mood: LCSW to follow for evaluation and support.              -antipsychotic agents: N/a 5. Neuropsych: This patient is not capable of making decisions on his own behalf.   Telesitter for safety  6. Skin/Wound Care: Monitor chest wall for healing.  7. Fluids/Electrolytes/Nutrition: Monitor I/Os. Added prosource for supplement and multivitamin. On soft diet  8. ACS with diffuse CAD: Treated medically with ASA/Plavix/Avapro/lopressor/crestor.  9. NSVT: Life vest to be placed prior to admission. On low dose lopressor bid.  Heart rate controlled on 7/25 10. T2DM with hyperglycemia: Hgb A1c- 8.2.  PTA was on Tresiba 90 units, novolog 8 units tidac, Jardince 25 mg and metformin 1000 mg/500 mg.   Added carb modified restrictions to meals   Resumed Jardiance   NovoLog 8 3 times daily started on 7/25  Remains elevated on 7/4, will consider restarting other medications if persistently elevated  Monitor the increased mobility 11. Abnormal LFTs:   LFTs elevated on 7/23, labs ordered for tomorrow 12. Indigestion/GERD- was on Prilosec 20 mg daily at home-  Ordered Ca Carbonate as well as Protonix 20 mg daily 13.  Labile blood pressure  Stabilizing on 7/25 14.  AKI  Creatinine 1.53 on 7/23, labs ordered for tomorrow  Encourage fluids 15.  Leukocytosis  WBCs 11.4 on 7/23, labs ordered for tomorrow  Afebrile  LOS: 3 days A FACE TO FACE EVALUATION WAS PERFORMED  Richard Hatfield Lorie Phenix 03/11/2020, 2:48 PM

## 2020-03-12 ENCOUNTER — Inpatient Hospital Stay (HOSPITAL_COMMUNITY): Payer: BC Managed Care – PPO | Admitting: Occupational Therapy

## 2020-03-12 ENCOUNTER — Inpatient Hospital Stay (HOSPITAL_COMMUNITY): Payer: BC Managed Care – PPO | Admitting: Physical Therapy

## 2020-03-12 ENCOUNTER — Inpatient Hospital Stay (HOSPITAL_COMMUNITY): Payer: BC Managed Care – PPO

## 2020-03-12 LAB — CBC
HCT: 40.8 % (ref 39.0–52.0)
Hemoglobin: 13.9 g/dL (ref 13.0–17.0)
MCH: 31.5 pg (ref 26.0–34.0)
MCHC: 34.1 g/dL (ref 30.0–36.0)
MCV: 92.5 fL (ref 80.0–100.0)
Platelets: 254 10*3/uL (ref 150–400)
RBC: 4.41 MIL/uL (ref 4.22–5.81)
RDW: 11.4 % — ABNORMAL LOW (ref 11.5–15.5)
WBC: 7.6 10*3/uL (ref 4.0–10.5)
nRBC: 0 % (ref 0.0–0.2)

## 2020-03-12 LAB — COMPREHENSIVE METABOLIC PANEL
ALT: 62 U/L — ABNORMAL HIGH (ref 0–44)
AST: 49 U/L — ABNORMAL HIGH (ref 15–41)
Albumin: 3.2 g/dL — ABNORMAL LOW (ref 3.5–5.0)
Alkaline Phosphatase: 112 U/L (ref 38–126)
Anion gap: 8 (ref 5–15)
BUN: 24 mg/dL — ABNORMAL HIGH (ref 8–23)
CO2: 23 mmol/L (ref 22–32)
Calcium: 8.8 mg/dL — ABNORMAL LOW (ref 8.9–10.3)
Chloride: 106 mmol/L (ref 98–111)
Creatinine, Ser: 0.98 mg/dL (ref 0.61–1.24)
GFR calc Af Amer: >60 mL/min (ref >60)
GFR calc non Af Amer: >60 mL/min (ref >60)
Glucose, Bld: 189 mg/dL — ABNORMAL HIGH (ref 70–99)
Potassium: 4.8 mmol/L (ref 3.5–5.1)
Sodium: 137 mmol/L (ref 135–145)
Total Bilirubin: 0.7 mg/dL (ref 0.3–1.2)
Total Protein: 6.5 g/dL (ref 6.5–8.1)

## 2020-03-12 LAB — GLUCOSE, CAPILLARY
Glucose-Capillary: 190 mg/dL — ABNORMAL HIGH (ref 70–99)
Glucose-Capillary: 216 mg/dL — ABNORMAL HIGH (ref 70–99)
Glucose-Capillary: 165 mg/dL — ABNORMAL HIGH (ref 70–99)
Glucose-Capillary: 194 mg/dL — ABNORMAL HIGH (ref 70–99)

## 2020-03-12 MED ORDER — INSULIN GLARGINE 100 UNIT/ML ~~LOC~~ SOLN
15.0000 [IU] | Freq: Two times a day (BID) | SUBCUTANEOUS | Status: DC
  Administered 2020-03-12 – 2020-03-18 (×13): 15 [IU] via SUBCUTANEOUS
  Filled 2020-03-12 (×15): qty 0.15

## 2020-03-12 NOTE — Progress Notes (Signed)
Physical Therapy Session Note  Patient Details  Name: Richard Hatfield MRN: [id number] Date of Birth: Nov 26, 1955  Today's Date: 03/12/2020 PT Individual Time: 0258-5277 and 1334-1415 PT Individual Time Calculation (min): 40 min and 41 min  Short Term Goals: Week 1:  PT Short Term Goal 1 (Week 1): Pt will negotiate 1 step without rails with LRAD & mod assist. PT Short Term Goal 2 (Week 1): Pt will ambulate 75 ft with LRAD & supervision. PT Short Term Goal 3 (Week 1): Pt will demonstrate ability for day to day recall/carry over during functional activities with mod assist.  Skilled Therapeutic Interventions/Progress Updates:  Treatment 1: Pt received in w/c & agreeable to tx. Sit<>stand from w/c with CGA, pt ambulates in room & in hallways without AD & min assist. Pt reports need to void & stands in bathroom with CGA, continently voiding bladder then performs hand hygiene standing at sink with CGA & cuing to use soap. Pt completes Berg Balance Test & scores 30/56; educated pt on interpretation of score & current fall risk. Patient demonstrates increased fall risk as noted by score of 30/56 on Berg Balance Scale.  (<36= high risk for falls, close to 100%; 37-45 significant >80%; 46-51 moderate >50%; 52-55 lower >25%). Pt utilized kinetron in standing with BUE>no UE support with min assist with cuing for upright posture with task focusing on weight shifting L<>R, BLE strengthening & NMR, and dynamic balance. At end of session pt left in recliner with BLE elevated, call bell in reach, telesitter in room & chair alarm donned (not alarming properly & NT in room to assess equipment). Pt frequently repeating same sentences throughout session without awareness. Pain: pt denies c/o pain during session.   Treatment 2: Pt received in room with wife present asking if pt has tried rollator. Educated her on purpose & use of rollator & pt currently ambulating without AD in PT to address balance & return to PLOF.  Educated her on pt's Berg Balance Score & interpretation of score & informed her that if he were to need an AD prior to d/c we will try most appropriate one prior to d/c. Pt's wife voiced understanding & exited session. Pt transfers sit<>stand with close supervision throughout session. Gait in room & bathroom without AD & min assist & pt with continent void standing in bathroom, and performs hand hygiene standing at sink with CGA. Gait throughout unit with min assist overall, progressing to gait with head turns & changes in speed upon PT command without any LOB noted but pt with minimal ability to increased gait speed when cued to. Pt stands on airex foam & engages in PNF patterns with theraball with multimodal cuing for technique, min assist for balance, and activity focusing on dynamic balance. Pt transfers onto mat table & lifts 1 extremity at a time, progressing to bird dog exercises, only performing a few repetitions before requiring a rest break. Pt requires max cuing to use signs & for path finding back to room, as well as max assist to recall activities from session & to complete memory notebook entry. Pt left in recliner with chair alarm donned, call bell in reach, telesitter in room. Pain: pt c/o unrated chest soreness that alleviates with repositioning  Therapy Documentation Precautions:  Precautions Precautions: Fall Precaution Comments: life vest Restrictions Weight Bearing Restrictions: No     Balance: Balance Balance Assessed: Yes Standardized Balance Assessment Standardized Balance Assessment: Berg Balance Test Berg Balance Test Sit to Stand: Able to stand without  using hands and stabilize independently Standing Unsupported: Able to stand 2 minutes with supervision Sitting with Back Unsupported but Feet Supported on Floor or Stool: Able to sit safely and securely 2 minutes Stand to Sit: Sits independently, has uncontrolled descent Transfers: Able to transfer with verbal cueing  and /or supervision Standing Unsupported with Eyes Closed: Able to stand 10 seconds with supervision Standing Ubsupported with Feet Together: Able to place feet together independently and stand for 1 minute with supervision From Standing, Reach Forward with Outstretched Arm: Can reach forward >5 cm safely (2") From Standing Position, Pick up Object from Floor: Able to pick up shoe, needs supervision From Standing Position, Turn to Look Behind Over each Shoulder: Needs supervision when turning Turn 360 Degrees: Needs close supervision or verbal cueing Standing Unsupported, Alternately Place Feet on Step/Stool: Able to complete >2 steps/needs minimal assist Standing Unsupported, One Foot in Front: Able to take small step independently and hold 30 seconds Standing on One Leg: Unable to try or needs assist to prevent fall (pt is able to lift leg for >3 seconds but then requires assistance to correct/LOB as pt with decreased awareness to place foot back on floor) Total Score: 30    Therapy/Group: Individual Therapy  [name] 03/12/2020, 4:18 PM

## 2020-03-12 NOTE — Progress Notes (Signed)
Occupational Therapy Session Note  Patient Details  Name: Richard Hatfield MRN: [id number] Date of Birth: [birth year]  Today's Date: 03/12/2020 OT Individual Time: 9728-2060 OT Individual Time Calculation (min): 57 min    Short Term Goals: Week 1:  OT Short Term Goal 1 (Week 1): Pt will complete LB dressing with min A OT Short Term Goal 2 (Week 1): Pt will maintain standing balance at the sink within BADL task with CGA OT Short Term Goal 3 (Week 1): Pt will tolerate standing activity for 5 mins in preparation for BADL tasks  Skilled Therapeutic Interventions/Progress Updates:    Pt greeted semi-reclined in bed and agreeable to OT treatment session focused on self-care retraining. Pt came to sitting EOB with supervision. Sit<>stand from EOB with verbal cues for safety as pt impulsive to stand and pull up on RW CGA to ambulate into bathroom w/ verbal cues for RW management over toilet to stand and void. Bathing completed sit<>stand from wc at the sink with CGA for balance and verbal cues for safety when standing to step out of pants. Pt discussing getting back to work next week. Pt able to recall that he had a heart attack and was at the hospital, but had difficulty understanding why this meant he could not work. Pt more understanding with explanation from OT. OT turned around to put dirty towels in laundry bag and pt impulsively saw his phone on the side table and ambulated over to the side table to plug in his phone demonstrating very poor safety awareness. Pt returned to wc and was given breakfast tray. Pt able to open all containers, manipulate feeding utensils and self-feed without assistance. Pt left seated in wc with chair alarm belt on securely, telesitting on, call bell in reach, and needs met.   Therapy Documentation Precautions:  Precautions Precautions: Fall Precaution Comments: life vest Restrictions Weight Bearing Restrictions: No Pain: Denies pain  Therapy/Group: Individual  Therapy  Valma Cava 03/12/2020, 8:11 AM

## 2020-03-12 NOTE — Progress Notes (Signed)
Speech Language Pathology Daily Session Note  Patient Details  Name: Richard Hatfield MRN: [id number] Date of Birth: 1956-07-23  Today's Date: 03/12/2020 SLP Individual Time: 9244-6286 SLP Individual Time Calculation (min): 27 min  Short Term Goals: Week 1: SLP Short Term Goal 1 (Week 1): Patient will recall new, daily information with Mod A verbal and visual cues. SLP Short Term Goal 2 (Week 1): Patient will demonstrate sustained attention to functional tasks for ~10 minutes with Mod A verbal cues for redirection SLP Short Term Goal 3 (Week 1): Patient will demonstrate functional problem solving for functional and familiar tasks with Mod A multimodal cues. SLP Short Term Goal 4 (Week 1): Patient will self-monitor and correct errors during functional tasks with Mod A verbal cues. SLP Short Term Goal 5 (Week 1): Patient will utilize a slow speech rate to maximize intelligibility to ~100% at the conversation level with Mod verbal cues.  Skilled Therapeutic Interventions: Skilled ST services focused on cognitive skills. Pt's wife was present and confirmed pt is near cognitive baseline expect for deficits in memory and safety awareness. SLP recommends to continue addressing all cognitive linguistic goals throughout the reporting period to further assess in functional task. SLP facilitated recall of today's event and hospital events with min A verbal cues for use of memory notebook. SLP instructed pt in recall strategy WRAP. All questions were answered to satisfaction.Pt was left in room with call bell within reach and bed/chair alarm set. ST recommends to continue skilled ST services.      Pain Pain Assessment Pain Score: 0-No pain  Therapy/Group: Individual Therapy  Rebecca Motta  [facility] 03/12/2020, 4:44 PM

## 2020-03-12 NOTE — Progress Notes (Signed)
   Patient Details  Name: Richard Hatfield MRN: [id number] Date of Birth: 06/23/1956  Today's Date: 03/12/2020  Hospital Problems: Principal Problem:   Anoxic brain injury [facility]) Active Problems:   Type 2 diabetes mellitus with complication, without long-term current use of insulin ([place])   Coronary artery disease involving native heart without angina pectoris   Transaminitis   NSVT (nonsustained ventricular tachycardia) (HCC)   Leukocytosis   AKI (acute kidney injury) ([place])   Labile blood pressure   Uncontrolled type 2 diabetes mellitus with hyperglycemia ([place])  Past Medical History:  Past Medical History:  Diagnosis Date  . Cardiac arrest ([place]) 02/28/2020  . Diabetes mellitus without complication ([place])   . Hypertension    Past Surgical History:  Past Surgical History:  Procedure Laterality Date  . LEFT HEART CATH AND CORONARY ANGIOGRAPHY N/A 02/28/2020   Procedure: LEFT HEART CATH AND CORONARY ANGIOGRAPHY;  Surgeon: Troy Sine, MD;  Location: [facility];  Service: Cardiovascular;  Laterality: N/A;   Social History:  reports that he has never smoked. He has never used smokeless tobacco. He reports current alcohol use. He reports that he does not use drugs.  Family / Support Systems Marital Status: Married How Long?: 20+ years Patient Roles: Spouse Spouse/Significant Other: [name] (wife): [phone] Children: No children Other Supports: None Anticipated Caregiver: Wife Ability/Limitations of Caregiver: Wife returns to work in August as she is a Education officer, museum. Wife has not completed FMLA as she is unsure if he will have bypass surgery. Caregiver Availability: Intermittent Family Dynamics: Pt lives with his wife, and pets.  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. Education: college grad Read: Yes Write: Yes Employment Status:  Employed Length of Employment: 7 Return to Work Plans: Plans to return to work if able too Public relations account executive Issues: Denies Guardian/Conservator: N/A   Abuse/Neglect Abuse/Neglect Assessment Can Be Completed: Yes Physical Abuse: Denies Verbal Abuse: Denies Sexual Abuse: Denies Exploitation of patient/patient's resources: Denies Self-Neglect: Denies  Emotional Status Pt's affect, behavior and adjustment status: Pt appeared to be in good spirits. Pt did present memory deficits as well in which wife would assist with answering the questions asked. Recent Psychosocial Issues: Denies Psychiatric History: Denies Substance Abuse History: Denies; etoh- 2-3 beers per week.  Patient / Family Perceptions, Expectations & Goals Pt/Family understanding of illness & functional limitations: Pt wife has a general understanding of care needs. Premorbid pt/family roles/activities: Independent Anticipated changes in roles/activities/participation: Assistance with ADLs/IADLs Pt/family expectations/goals: Pt goal is to regain stability. Pt wife would like for him to not be too anxious all the time.  Community Resources Express Scripts: None Premorbid Home Care/DME Agencies: None Transportation available at discharge: wife Resource referrals recommended: Neuropsychology  Discharge Planning Living Arrangements: Spouse/significant other Support Systems: Spouse/significant other Type of Residence: Private residence Administrator, sports: Multimedia programmer (specify) Nurse, mental health) Financial Resources: Employment Financial Screen Referred: No Living Expenses: Own Money Management: Spouse Does the patient have any problems obtaining your medications?: No Care Coordinator Barriers to Discharge: Decreased caregiver support, Lack of/limited family support Care Coordinator Anticipated Follow Up Needs: HH/OP  Clinical Impression SW met with pt and wife in room to introduce self, explain role, and  discuss discharge process. 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 No HCPOA. ME: rollator. PCP- Domenick Gong with [facility] 03/12/2020, 5:10 PM

## 2020-03-12 NOTE — Progress Notes (Signed)
[facility] PHYSICAL MEDICINE & REHABILITATION PROGRESS NOTE  Subjective/Complaints: Patient seen laying in bed this morning.  He states he slept fairly well overnight.  He states he did not receive his evening medications until late.  ROS: Denies CP, shortness of breath, nausea, vomiting, diarrhea.  Objective: Vital Signs: Blood pressure 114/68, pulse 64, temperature 99.1 F (37.3 C), temperature source Oral, resp. rate 17, height 5\' 8"  (1.727 m), weight 88.5 kg, SpO2 97 %. No results found. Recent Labs    03/09/20 1501 03/12/20 0632  WBC 11.4* 7.6  HGB 14.2 13.9  HCT 42.1 40.8  PLT 244 254   Recent Labs    03/09/20 1501 03/12/20 0632  NA 134* 137  K 4.5 4.8  CL 101 106  CO2 23 23  GLUCOSE 259* 189*  BUN 29* 24*  CREATININE 1.53* 0.98  CALCIUM 9.4 8.8*    Physical Exam: BP 114/68 (BP Location: Left Arm)   Pulse 64   Temp 99.1 F (37.3 C) (Oral)   Resp 17   Ht 5\' 8"  (1.727 m)   Wt 88.5 kg   SpO2 97%   BMI 29.65 kg/m   Constitutional: No distress . Vital signs reviewed. HENT: Normocephalic.  Atraumatic. Eyes: EOMI. No discharge. Cardiovascular: No JVD.  RRR. Respiratory: Normal effort.  No stridor.  Bilateral clear to auscultation. GI: Non-distended.  BS +. Skin: Left wrist ecchymosis.  Intact. Psych: Normal mood.  Normal behavior. Musc: No edema in extremities.  No tenderness in extremities. Neuro: Alert Motor: 5/5 grossly throughout, unchanged  Assessment/Plan: 1. Functional deficits secondary to anoxic brain injury which require 3+ hours per day of interdisciplinary therapy in a comprehensive inpatient rehab setting.  Physiatrist is providing close team supervision and 24 hour management of active medical problems listed below.  Physiatrist and rehab team continue to assess barriers to discharge/monitor patient progress toward functional and medical goals  Care Tool:  Bathing    Body parts bathed by patient: Left arm, Right arm, Abdomen, Front  perineal area, Buttocks, Right upper leg, Left upper leg, Face, Left lower leg, Right lower leg     Body parts n/a: Chest   Bathing assist Assist Level: Contact Guard/Touching assist     Upper Body Dressing/Undressing Upper body dressing   What is the patient wearing?: Pull over shirt    Upper body assist Assist Level: Supervision/Verbal cueing    Lower Body Dressing/Undressing Lower body dressing      What is the patient wearing?: Underwear/pull up, Pants     Lower body assist Assist for lower body dressing: Contact Guard/Touching assist     Toileting Toileting    Toileting assist Assist for toileting: Contact Guard/Touching assist     Transfers Chair/bed transfer  Transfers assist     Chair/bed transfer assist level: Contact Guard/Touching assist Chair/bed transfer assistive device: Other (rollator)   Locomotion Ambulation   Ambulation assist      Assist level: Minimal Assistance - Patient > 75% Assistive device: No Device Max distance: 150 ft   Walk 10 feet activity   Assist     Assist level: Minimal Assistance - Patient > 75% Assistive device: No Device   Walk 50 feet activity   Assist    Assist level: Minimal Assistance - Patient > 75% Assistive device: No Device    Walk 150 feet activity   Assist    Assist level: Minimal Assistance - Patient > 75% Assistive device: No Device    Walk 10 feet on uneven surface  activity   Assist     Assist level: Minimal Assistance - Patient > 75% Assistive device:  (none)   Wheelchair     Assist Will patient use wheelchair at discharge?:  (potentially for community mobility) Type of Wheelchair: Manual    Wheelchair assist level: Minimal Assistance - Patient > 75% Max wheelchair distance: 200 ft    Wheelchair 50 feet with 2 turns activity    Assist        Assist Level: Minimal Assistance - Patient > 75%   Wheelchair 150 feet activity     Assist     Assist  Level: Minimal Assistance - Patient > 75%      Medical Problem List and Plan: 1.  Impaired function secondary to anoxic brain injury from prolonged CPR/STEMIsp cardiac arrest  Continue CIR 2.  Antithrombotics: -DVT/anticoagulation:  Pharmaceutical: Lovenox--will start             -antiplatelet therapy: On ASA/Plavix.  3. Pain Management: N/a 4. Mood: LCSW to follow for evaluation and support.              -antipsychotic agents: N/a 5. Neuropsych: This patient is not capable of making decisions on his own behalf.   Telesitter for safety  6. Skin/Wound Care: Monitor chest wall for healing.  7. Fluids/Electrolytes/Nutrition: Monitor I/Os. Added prosource for supplement and multivitamin. On soft diet  8. ACS with diffuse CAD: Treated medically with ASA/Plavix/Avapro/lopressor/crestor.  9. NSVT: Life vest to be placed prior to admission. On low dose lopressor bid.  Heart rate controlled on 7/26 10. T2DM with hyperglycemia: Hgb A1c- 8.2.  PTA was on Tresiba 90 units, novolog 8 units tidac, Jardince 25 mg and metformin 1000 mg/500 mg.   Added carb modified restrictions to meals   Resumed Jardiance   Lantus 15 twice daily started on 7/26  NovoLog 8 3 times daily started on 7/25  Remains elevated on 7/4, will consider restarting other medications if persistently elevated  Monitor the increased mobility 11. Abnormal LFTs:   LFTs elevated, slowly improving on 7/26 12. Indigestion/GERD- was on Prilosec 20 mg daily at home-  Ordered Ca Carbonate as well as Protonix 20 mg daily 13.  Labile blood pressure  Stabilizing on 7/25 14.  AKI  Creatinine 0.98 on 7/26, improving  Encourage fluids 15.  Leukocytosis: Resolved  WBCs 7.6 on 7/26  Afebrile  LOS: 4 days A FACE TO FACE EVALUATION WAS PERFORMED  Selenia Mihok Lorie Phenix 03/12/2020, 1:23 PM

## 2020-03-13 ENCOUNTER — Inpatient Hospital Stay (HOSPITAL_COMMUNITY): Payer: BC Managed Care – PPO | Admitting: Physical Therapy

## 2020-03-13 ENCOUNTER — Inpatient Hospital Stay (HOSPITAL_COMMUNITY): Payer: BC Managed Care – PPO | Admitting: Occupational Therapy

## 2020-03-13 LAB — GLUCOSE, CAPILLARY
Glucose-Capillary: 169 mg/dL — ABNORMAL HIGH (ref 70–99)
Glucose-Capillary: 112 mg/dL — ABNORMAL HIGH (ref 70–99)
Glucose-Capillary: 108 mg/dL — ABNORMAL HIGH (ref 70–99)
Glucose-Capillary: 230 mg/dL — ABNORMAL HIGH (ref 70–99)

## 2020-03-13 NOTE — Patient Care Conference (Signed)
Inpatient RehabilitationTeam Conference and Plan of Care Update Date: 03/13/2020   Time: 10:34 AM    Patient Name: Richard Hatfield      Medical Record Number: [id number]  Date of Birth: [birth year] Sex: Male         Room/Bed: 4W11C/4W11C-01 Payor Info: Payor: [place] / Plan: [employer] PPO / Product Type: *No Product type* /    Admit Date/Time:  03/08/2020  1:22 PM  Primary Diagnosis:  Anoxic brain injury [facility])  Hospital Problems: Principal Problem:   Anoxic brain injury ([place]) Active Problems:   Type 2 diabetes mellitus with complication, without long-term current use of insulin ([place])   Coronary artery disease involving native heart without angina pectoris   Transaminitis   NSVT (nonsustained ventricular tachycardia) (HCC)   Leukocytosis   AKI (acute kidney injury) ([place])   Labile blood pressure   Uncontrolled type 2 diabetes mellitus with hyperglycemia [facility])    Expected Discharge Date: Expected Discharge Date: 03/20/20  Team Members Present: Physician leading conference: Dr. Leeroy Cha Care Coodinator Present: Loralee Pacas, LCSWA;Mckynzi Cammon Creig Hines, RN, BSN, CRRN Nurse Present: Mohammed Kindle, RN PT Present: Lavone Nian, PT OT Present: Cherylynn Ridges, OT SLP Present: Charolett Bumpers, SLP PPS Coordinator present : Gunnar Fusi, SLP     Current Status/Progress Goal Weekly Team Focus  Bowel/Bladder   Pt has  been continent of bowel/bladder.  To remain continent.  Assess tolieting needs q2 or often, answer call light promptly.   Swallow/Nutrition/ Hydration             ADL's   Supervision/CGA overall  Supervision  self-care retraining, pt/family education, dc planning, activity tolerance,   Mobility   supervision bed mobility, CGA transfers & gait with rollator, min assist gait without AD  supervision overall except min assist 1 step without rails  balance, endurance, strengthening, awareness, gait, pt/family education, d/c planning    Communication   Mod- Min A - near baseline per wife  Mod I  slow rate   Safety/Cognition/ Behavioral Observations  Max-Mod A  Min-Supervision A  mildly complex problem solving, emergent awareness, recall with notebook and sustain attention   Pain   No complaints of pain.  To remain pain free.  Assess pain q shift or prn.   Skin   Has scabbed abrasion to right knee only.  Promote healing and prevent any breakdown.  Assess skin q shift or prn.     Team Discussion:  Discharge Planning/Teaching Needs:  d/c to home with his wife who will provide 24/7 care. She has not taken FMLA yet due to not being sure when he will have his bypass surgery.  Family education as recommended by therapy.   Current Update:  None  Current Barriers to Discharge:  Pending surgery  Possible Resolutions to Barriers: MD to check with surgeon and update the wife on surgery schedule.  Patient on target to meet rehab goals: yes, Continent B/B, progressing towards discharge goals. SLP working on memory impairment strategies.  *See Care Plan and progress notes for long and short-term goals.   Revisions to Treatment Plan:  none    Medical Summary Current Status: On telesitter, wife would like medical update, diabetes, impaired balance, BP is well controlled Weekly Focus/Goal: Continue telesitter, will provide medical update to wife today, monitor CBGs and titrate Lantus as needed, monitor vitals TID, monitor labs weekly  Barriers to Discharge: Medical stability;Decreased family/caregiver support  Barriers to Discharge Comments: Impaired cognition/impulsivity Possible Resolutions to Barriers: will need  24/7 supervision from wife   Continued Need for Acute Rehabilitation Level of Care: The patient requires daily medical management by a physician with specialized training in physical medicine and rehabilitation for the following reasons: Direction of a multidisciplinary physical rehabilitation program to  maximize functional independence : Yes Medical management of patient stability for increased activity during participation in an intensive rehabilitation regime.: Yes Analysis of laboratory values and/or radiology reports with any subsequent need for medication adjustment and/or medical intervention. : Yes   I attest that I was present, lead the team conference, and concur with the assessment and plan of the team.   Cristi Loron 03/13/2020, 2:21 PM

## 2020-03-13 NOTE — Progress Notes (Signed)
[place] PHYSICAL MEDICINE & REHABILITATION PROGRESS NOTE  Subjective/Complaints: No complaints this morning Denies pain, constipation.  BP well controlled. Labs stable yesterday  ROS: Denies CP, shortness of breath, nausea, vomiting, diarrhea.  Objective: Vital Signs: Blood pressure 122/76, pulse 59, temperature 98.5 F (36.9 C), resp. rate 15, height 5\' 8"  (1.727 m), weight 88.5 kg, SpO2 96 %. No results found. Recent Labs    03/12/20 0632  WBC 7.6  HGB 13.9  HCT 40.8  PLT 254   Recent Labs    03/12/20 0632  NA 137  K 4.8  CL 106  CO2 23  GLUCOSE 189*  BUN 24*  CREATININE 0.98  CALCIUM 8.8*    Physical Exam: BP 122/76 (BP Location: Right Arm)   Pulse 59   Temp 98.5 F (36.9 C)   Resp 15   Ht 5\' 8"  (1.727 m)   Wt 88.5 kg   SpO2 96%   BMI 29.65 kg/m   General: Alert and oriented x 3, No apparent distress HEENT: Head is normocephalic, atraumatic, PERRLA, EOMI, sclera anicteric, oral mucosa pink and moist, dentition intact, ext ear canals clear,  Neck: Supple without JVD or lymphadenopathy Heart: Reg rate and rhythm. No murmurs rubs or gallops Chest: CTA bilaterally without wheezes, rales, or rhonchi; no distress Abdomen: Soft, non-tender, non-distended, bowel sounds positive. Extremities: No clubbing, cyanosis, or edema. Pulses are 2+ Skin: Left wrist ecchymosis.  Intact. Psych: Normal mood.  Normal behavior. Musc: No edema in extremities.  No tenderness in extremities. Neuro: Alert Motor: 5/5 grossly throughout, unchanged   Assessment/Plan: 1. Functional deficits secondary to anoxic brain injury which require 3+ hours per day of interdisciplinary therapy in a comprehensive inpatient rehab setting.  Physiatrist is providing close team supervision and 24 hour management of active medical problems listed below.  Physiatrist and rehab team continue to assess barriers to discharge/monitor patient progress toward functional and medical goals  Care  Tool:  Bathing    Body parts bathed by patient: Left arm, Right arm, Abdomen, Front perineal area, Buttocks, Right upper leg, Left upper leg, Face, Left lower leg, Right lower leg     Body parts n/a: Chest   Bathing assist Assist Level: Contact Guard/Touching assist     Upper Body Dressing/Undressing Upper body dressing   What is the patient wearing?: Pull over shirt    Upper body assist Assist Level: Supervision/Verbal cueing    Lower Body Dressing/Undressing Lower body dressing      What is the patient wearing?: Pants, Underwear/pull up     Lower body assist Assist for lower body dressing: Contact Guard/Touching assist     Toileting Toileting    Toileting assist Assist for toileting: Contact Guard/Touching assist     Transfers Chair/bed transfer  Transfers assist     Chair/bed transfer assist level: Contact Guard/Touching assist Chair/bed transfer assistive device: Other (rollator)   Locomotion Ambulation   Ambulation assist      Assist level: Minimal Assistance - Patient > 75% Assistive device: No Device Max distance: >250 ft   Walk 10 feet activity   Assist     Assist level: Minimal Assistance - Patient > 75% Assistive device: No Device   Walk 50 feet activity   Assist    Assist level: Minimal Assistance - Patient > 75% Assistive device: No Device    Walk 150 feet activity   Assist    Assist level: Minimal Assistance - Patient > 75% Assistive device: No Device    Walk 10  feet on uneven surface  activity   Assist     Assist level: Minimal Assistance - Patient > 75% Assistive device:  (none)   Wheelchair     Assist Will patient use wheelchair at discharge?: No (potentially for community mobility) Type of Wheelchair: Manual    Wheelchair assist level: Minimal Assistance - Patient > 75% Max wheelchair distance: 200 ft    Wheelchair 50 feet with 2 turns activity    Assist        Assist Level: Minimal  Assistance - Patient > 75%   Wheelchair 150 feet activity     Assist     Assist Level: Minimal Assistance - Patient > 75%      Medical Problem List and Plan: 1.  Impaired function secondary to anoxic brain injury from prolonged CPR/STEMIsp cardiac arrest  Continue CIR  Patient would like to shower today 2.  Antithrombotics: -DVT/anticoagulation:  Pharmaceutical: Lovenox--continue             -antiplatelet therapy: On ASA/Plavix.  3. Pain Management: N/a 4. Mood: LCSW to follow for evaluation and support.              -antipsychotic agents: N/a 5. Neuropsych: This patient is not capable of making decisions on his own behalf.   Continue telesitter for safety  6. Skin/Wound Care: Monitor chest wall for healing.  7. Fluids/Electrolytes/Nutrition: Monitor I/Os. Added prosource for supplement and multivitamin. On soft diet  8. ACS with diffuse CAD: Treated medically with ASA/Plavix/Avapro/lopressor/crestor.  9. NSVT: Life vest to be placed prior to admission. On low dose lopressor bid.  Controlled to low on 7/27 10. T2DM with hyperglycemia: Hgb A1c- 8.2.  PTA was on Tresiba 90 units, novolog 8 units tidac, Jardince 25 mg and metformin 1000 mg/500 mg.   Added carb modified restrictions to meals   Resumed Jardiance   Lantus 15 twice daily started on 7/26  NovoLog 8 3 times daily started on 7/25  Remains elevated on 7/4, will consider restarting other medications if persistently elevated  Monitor the increased mobility 11. Abnormal LFTs:   LFTs elevated, slowly improving on 7/26 12. Indigestion/GERD- was on Prilosec 20 mg daily at home-  Ordered Ca Carbonate as well as Protonix 20 mg daily 13.  Labile blood pressure  Stabilizing on 7/25 14.  AKI  Creatinine 0.98 on 7/26, improving  Encourage fluids 15.  Leukocytosis: Resolved  WBCs 7.6 on 7/26  Afebrile  LOS: 5 days A FACE TO FACE EVALUATION WAS PERFORMED  Clide Deutscher Nerissa Constantin 03/13/2020, 8:58 AM

## 2020-03-13 NOTE — Progress Notes (Signed)
Physical Therapy Session Note  Patient Details  Name: Richard Hatfield MRN: [id number] Date of Birth: 03/26/1956  Today's Date: 03/13/2020 PT Individual Time: 9093-1121 PT Individual Time Calculation (min): 60 min   Short Term Goals: Week 1:  PT Short Term Goal 1 (Week 1): Pt will negotiate 1 step without rails with LRAD & mod assist. PT Short Term Goal 2 (Week 1): Pt will ambulate 75 ft with LRAD & supervision. PT Short Term Goal 3 (Week 1): Pt will demonstrate ability for day to day recall/carry over during functional activities with mod assist.  Skilled Therapeutic Interventions/Progress Updates:   Pt received sitting in WC and agreeable to PT.    Gait training throughout rehab unit with Rollator x 174f, 2086fand 18059fith supervision assist from PT for safety through tight spaces. Dynamic gait training to weave through 10cones x 2 with Rollator and then without. Supervision assist for both bouts with cues for safety and awareness of obstacles.   Dynamic balance training using Wii sports fine motor task of bowling while on airex pad. Supervision assist throughout with min cues to correct posterior LOB. Dynamic balance training also performed on Wii fit to engage in table tilt x 2 and penguin slide. Cues for use of hip strategy to control COM. No LOB noted throughout.   Patient returned to room and left sitting in recliner with call bell in reach and all needs met.         Therapy Documentation Precautions:  Precautions Precautions: Fall Precaution Comments: life vest Restrictions Weight Bearing Restrictions: No    Vital Signs: Therapy Vitals Temp: 97.6 F (36.4 C) Temp Source: Oral Pulse Rate: 56 Resp: 16 BP: 118/65 Patient Position (if appropriate): Sitting Oxygen Therapy O2 Device: Room Air Pain:   denies     Therapy/Group: Individual Therapy  AusLorie Phenix27/2021, 4:45 PM

## 2020-03-13 NOTE — Plan of Care (Signed)
Behavioral Plan   [place] Level: n/a  Behavior to decrease/ eliminate:  Poor memory Poor safety awareness Some impulsivity at times Forgetful about battery pack  Changes to environment:  Floor mats, Telesitter  Interventions: Telesitter, Safety ALARM  belt- good visual and physical reminder not to get up. Pt can sit at nurses station if he becomes restless Fidget toys and stress balls for restlessness   Recommendations for interactions with patient: Make sure Life Vest battery is around neck/arm Write in memory notebook after sessions  Attendees:  Cherylynn Ridges, OT Lavone Nian, [place], RN

## 2020-03-13 NOTE — Progress Notes (Signed)
Patient ID: Richard Hatfield, male   DOB: 09-04-1955, 64 y.o.   MRN: [id number]  SW met with pt and pt wife in room to provide updates from team conference, and d/c date 8/3. Wife to be in for family education all day on Friday. SW informed will continue to follow-up as there are updates on d/c recommendations.   Loralee Pacas, MSW, [place] Office: [phone] Cell: [phone] Fax: [phone]

## 2020-03-13 NOTE — Progress Notes (Signed)
Occupational Therapy Session Note  Patient Details  Name: Richard Hatfield MRN: [id number] Date of Birth: 12/29/1955  Today's Date: 03/13/2020 OT Individual Time: 1505-6979 OT Individual Time Calculation (min): 74 min   Short Term Goals: Week 1:  OT Short Term Goal 1 (Week 1): Pt will complete LB dressing with min A OT Short Term Goal 2 (Week 1): Pt will maintain standing balance at the sink within BADL task with CGA OT Short Term Goal 3 (Week 1): Pt will tolerate standing activity for 5 mins in preparation for BADL tasks  Skilled Therapeutic Interventions/Progress Updates:    Pt greeted seated in recliner and agreeable to OT treatment session. OT educated pt on Life Vest and bathing, with discussion about removing life vest for showering at home, will educate spouse further during family education. Pt requesting to sponge bathe this morning. Worked on functional ambulation around the room to collect clothing and bathing supplies. Pt needed CGA/min A for functional ambulation in room. Educated on balance strategies when reaching into dresser drawers. Pt often forgetting he has life vest on and needs to keep up with battery prior to ambulating. Worked on standing balance/endurance within bathing tasks. Pt tolerate standing for UB bathing with set-up A and overall supervision. Pt needed verbal cues for safety when stepping in and out of pants. Dyanmic standing with CGA overall. Pt completed shaving task in standing without cues. Pt took seated rest break, then stood again for toothbrushing and flossing. Pt donned socks and shoes with set-up A. Pt then ambulated to therapy gym without AD and CGA. UB there-ex on Ergometer on level 5. 2 sets of 5 minute intervals going forwards, then backwards. Pt ambulated back to room with CGA and left seated in recliner with chair alarm on, call bell in reach and needs met.   Therapy Documentation Precautions:  Precautions Precautions: Fall Precaution Comments: life  vest Restrictions Weight Bearing Restrictions: No Pain: Pain Assessment Pain Scale: 0-10 Pain Score: 0-No pain   Therapy/Group: Individual Therapy  Valma Cava 03/13/2020, 10:12 AM

## 2020-03-13 NOTE — Progress Notes (Addendum)
Physical Therapy Session Note  Patient Details  Name: Richard Hatfield MRN: [id number] Date of Birth: 02-15-1956  Today's Date: 03/13/2020 PT Individual Time: 0160-1093 PT Individual Time Calculation (min): 40 min   Short Term Goals: Week 1:  PT Short Term Goal 1 (Week 1): Pt will negotiate 1 step without rails with LRAD & mod assist. PT Short Term Goal 2 (Week 1): Pt will ambulate 75 ft with LRAD & supervision. PT Short Term Goal 3 (Week 1): Pt will demonstrate ability for day to day recall/carry over during functional activities with mod assist.  Skilled Therapeutic Interventions/Progress Updates:  Pt received in recliner with wife present in room, educated them on anticipated d/c date as set in conference. Sit<>stand with supervision and pt ambulates in room/bathroom without AD & CGA. Pt with 2 continent voids in bathroom, standing with close supervision for balance. Gait room<>outside north tower without AD & CGA with no rest breaks with task focusing on endurance training. Pt does demonstrate decreased reciprocal arm swing during gait. Outside, PT educated pt on impaired memory & awareness as pt discussing quick return to work (educated pt & wife on need for pt to be cleared by MD for return to work & driving). Pt is able to pathfind back to room with questioning cuing & extra time. Back on unit pt performs 10x sit<>stand x 2 sets for BLE strengthening. Pt does report some fatigue at end of session. Pt left in recliner with chair alarm donned & call bell in reach, wife present in room.  Addendum: Gait outside over uneven surface without AD & CGA without LOB noted.  Therapy Documentation Precautions:  Precautions Precautions: Fall Precaution Comments: life vest Restrictions Weight Bearing Restrictions: No  Pain: No c/o pain during session   Therapy/Group: Individual Therapy  [name] 03/13/2020, 12:05 PM

## 2020-03-14 ENCOUNTER — Inpatient Hospital Stay (HOSPITAL_COMMUNITY): Payer: BC Managed Care – PPO

## 2020-03-14 ENCOUNTER — Inpatient Hospital Stay (HOSPITAL_COMMUNITY): Payer: BC Managed Care – PPO | Admitting: Physical Therapy

## 2020-03-14 LAB — GLUCOSE, CAPILLARY
Glucose-Capillary: 159 mg/dL — ABNORMAL HIGH (ref 70–99)
Glucose-Capillary: 123 mg/dL — ABNORMAL HIGH (ref 70–99)
Glucose-Capillary: 148 mg/dL — ABNORMAL HIGH (ref 70–99)
Glucose-Capillary: 156 mg/dL — ABNORMAL HIGH (ref 70–99)

## 2020-03-14 NOTE — Progress Notes (Signed)
Occupational Therapy Session Note  Patient Details  Name: Richard Hatfield MRN: [id number] Date of Birth: 05-Oct-1955  Today's Date: 03/14/2020 OT Individual Time: 1300-1355 OT Individual Time Calculation (min): 55 min    Short Term Goals: Week 1:  OT Short Term Goal 1 (Week 1): Pt will complete LB dressing with min A OT Short Term Goal 2 (Week 1): Pt will maintain standing balance at the sink within BADL task with CGA OT Short Term Goal 3 (Week 1): Pt will tolerate standing activity for 5 mins in preparation for BADL tasks  Skilled Therapeutic Interventions/Progress Updates:    Pt received sitting up in the recliner with no c/o pain, eager to take shower. Checked in with PA re lifevest removal for shower. Pt's wife present for session. Pt completed functional mobility around the room with (S). Pt required cueing for safety and attention to lifevest battery pack when completing sit <> standing. Pt removed battery and doffed lifevest. He completed functional mobility into the bathroom with (S). Recommended that at home pt remove lifevest immediately before showering and complete as little standing and walking without it on as possible. Pt bathed from seated on shower chair with close (S), requiring cueing for sequencing and safety. Pt's wife actively involved and observing. Pt dried off thoroughly and he donned the lifevest with min A. Checked battery pack screen with PA to ensure correct placement of electrodes. Pt ended session with 2x 50 ft with rest break in between. Pt completed blocked practice sit <> stand with overhead reach to challenge functional activity tolerance. Pt returned to his room and was left sitting in the recliner with all needs met, chair alarm belt fastened.   Therapy Documentation Precautions:  Precautions Precautions: Fall Precaution Comments: life vest Restrictions Weight Bearing Restrictions: No   Therapy/Group: Individual Therapy  Curtis Sites 03/14/2020, 6:37  AM

## 2020-03-14 NOTE — Progress Notes (Signed)
[facility] PHYSICAL MEDICINE & REHABILITATION PROGRESS NOTE  Subjective/Complaints: No complaints this morning.  Vitals stable Sleeping well at night Denies pain  ROS: Denies CP, shortness of breath, nausea, vomiting, diarrhea.  Objective: Vital Signs: Blood pressure (!) 129/71, pulse 67, temperature 98.5 F (36.9 C), resp. rate 16, height 5\' 8"  (1.727 m), weight 85.7 kg, SpO2 96 %. No results found. Recent Labs    03/12/20 0632  WBC 7.6  HGB 13.9  HCT 40.8  PLT 254   Recent Labs    03/12/20 0632  NA 137  K 4.8  CL 106  CO2 23  GLUCOSE 189*  BUN 24*  CREATININE 0.98  CALCIUM 8.8*    Physical Exam: BP (!) 129/71 (BP Location: Right Arm)   Pulse 67   Temp 98.5 F (36.9 C)   Resp 16   Ht 5\' 8"  (1.727 m)   Wt 85.7 kg   SpO2 96%   BMI 28.74 kg/m   General: Alert and oriented x 3, No apparent distress HEENT: Head is normocephalic, atraumatic, PERRLA, EOMI, sclera anicteric, oral mucosa pink and moist, dentition intact, ext ear canals clear,  Neck: Supple without JVD or lymphadenopathy Heart: Reg rate and rhythm. No murmurs rubs or gallops Chest: CTA bilaterally without wheezes, rales, or rhonchi; no distress Abdomen: Soft, non-tender, non-distended, bowel sounds positive. Extremities: No clubbing, cyanosis, or edema. Pulses are 2+ Skin: Left wrist ecchymosis.  Intact. Psych: Normal mood.  Normal behavior. Musc: No edema in extremities.  No tenderness in extremities. Neuro: Alert Motor: 5/5 grossly throughout, unchanged   Assessment/Plan: 1. Functional deficits secondary to anoxic brain injury which require 3+ hours per day of interdisciplinary therapy in a comprehensive inpatient rehab setting.  Physiatrist is providing close team supervision and 24 hour management of active medical problems listed below.  Physiatrist and rehab team continue to assess barriers to discharge/monitor patient progress toward functional and medical goals  Care  Tool:  Bathing    Body parts bathed by patient: Left arm, Right arm, Abdomen, Front perineal area, Buttocks, Right upper leg, Left upper leg, Face, Left lower leg, Right lower leg     Body parts n/a: Chest   Bathing assist Assist Level: Contact Guard/Touching assist     Upper Body Dressing/Undressing Upper body dressing   What is the patient wearing?: Pull over shirt    Upper body assist Assist Level: Set up assist    Lower Body Dressing/Undressing Lower body dressing      What is the patient wearing?: Underwear/pull up, Pants     Lower body assist Assist for lower body dressing: Contact Guard/Touching assist     Toileting Toileting    Toileting assist Assist for toileting: Contact Guard/Touching assist     Transfers Chair/bed transfer  Transfers assist     Chair/bed transfer assist level: Contact Guard/Touching assist Chair/bed transfer assistive device: Other (rollator)   Locomotion Ambulation   Ambulation assist      Assist level: Supervision/Verbal cueing Assistive device: No Device Max distance: >300'   Walk 10 feet activity   Assist     Assist level: Supervision/Verbal cueing Assistive device: No Device   Walk 50 feet activity   Assist    Assist level: Supervision/Verbal cueing Assistive device: No Device    Walk 150 feet activity   Assist    Assist level: Supervision/Verbal cueing Assistive device: No Device    Walk 10 feet on uneven surface  activity   Assist     Assist level:  Contact Guard/Touching assist Assistive device:  (none)   Wheelchair     Assist Will patient use wheelchair at discharge?: No (potentially for community mobility) Type of Wheelchair: Manual    Wheelchair assist level: Minimal Assistance - Patient > 75% Max wheelchair distance: 200 ft    Wheelchair 50 feet with 2 turns activity    Assist        Assist Level: Minimal Assistance - Patient > 75%   Wheelchair 150 feet  activity     Assist     Assist Level: Minimal Assistance - Patient > 75%      Medical Problem List and Plan: 1.  Impaired function secondary to anoxic brain injury from prolonged CPR/STEMIsp cardiac arrest  Continue CIR 2.  Antithrombotics: -DVT/anticoagulation:  Pharmaceutical: Lovenox--d/c as patient is ambulating >300 feet             -antiplatelet therapy: On ASA/Plavix.  3. Pain Management: N/a 4. Mood: LCSW to follow for evaluation and support. Positive mood.             -antipsychotic agents: N/a 5. Neuropsych: This patient is not capable of making decisions on his own behalf.   Continue telesitter for safety  6. Skin/Wound Care: Monitor chest wall for healing.  7. Fluids/Electrolytes/Nutrition: Monitor I/Os. Added prosource for supplement and multivitamin. On soft diet  8. ACS with diffuse CAD: Treated medically with ASA/Plavix/Avapro/lopressor/crestor.  9. NSVT: Life vest to be placed prior to admission. On low dose lopressor bid.  Controlled to low on 7/27 10. T2DM with hyperglycemia: Hgb A1c- 8.2.  PTA was on Tresiba 90 units, novolog 8 units tidac, Jardince 25 mg and metformin 1000 mg/500 mg.   Added carb modified restrictions to meals   Resumed Jardiance   Lantus 15 twice daily started on 7/26  NovoLog 8 3 times daily started on 7/25  Remains elevated on 7/4, will consider restarting other medications if persistently elevated  Monitor the increased mobility 11. Abnormal LFTs:   LFTs elevated, slowly improving on 7/26 12. Indigestion/GERD- was on Prilosec 20 mg daily at home-  Ordered Ca Carbonate as well as Protonix 20 mg daily 13.  Labile blood pressure  Stabilizing on 7/25 14.  AKI  Creatinine 0.98 on 7/26, improving  Encourage fluids 15.  Leukocytosis: Resolved  WBCs 7.6 on 7/26  Afebrile  LOS: 6 days A FACE TO FACE EVALUATION WAS PERFORMED  Martha Clan P Kennadie Brenner 03/14/2020, 7:45 PM

## 2020-03-14 NOTE — Progress Notes (Signed)
Physical Therapy Weekly Progress Note  Patient Details  Name: Richard Hatfield MRN: [id number] Date of Birth: [birth year]  Beginning of progress report period: March 09, 2020 End of progress report period: March 14, 2020  Today's Date: 03/14/2020 PT Individual Time: 0735-0900 PT Individual Time Calculation (min): 85 min   Patient has met 2 of 3 short term goals.  Pt is making steady progress towards LTG's as he is currently able to ambulate with CGA<>supervision without AD and is demonstrating slowly improving memory/recall but still requires mod/max cuing. Pt does demonstrate impaired awareness as he continues to discuss returning to work. Pt would benefit from continued skilled PT treatment to address the deficits noted above/below to increase independence with all functional mobility prior to d/c home.  Patient continues to demonstrate the following deficits muscle weakness, decreased cardiorespiratoy endurance, decreased coordination, decreased attention, decreased awareness, decreased problem solving, decreased safety awareness, decreased memory and delayed processing, and decreased standing balance, decreased postural control and decreased balance strategies and therefore will continue to benefit from skilled PT intervention to increase functional independence with mobility.  Patient progressing toward long term goals..  Continue plan of care.  PT Short Term Goals Week 1:  PT Short Term Goal 1 (Week 1): Pt will negotiate 1 step without rails with LRAD & mod assist. PT Short Term Goal 1 - Progress (Week 1): Met PT Short Term Goal 2 (Week 1): Pt will ambulate 75 ft with LRAD & supervision. PT Short Term Goal 2 - Progress (Week 1): Met PT Short Term Goal 3 (Week 1): Pt will demonstrate ability for day to day recall/carry over during functional activities with mod assist. PT Short Term Goal 3 - Progress (Week 1): Progressing toward goal Week 2:  PT Short Term Goal 1 (Week 2): STG = LTG due to  estimated d/c date.  Skilled Therapeutic Interventions/Progress Updates:  Pt received in recliner with nurse present administering meds. Sit<>stand without AD & supervision, gait in room/bathroom with close supervision<>CGA. Pt with continent void standing in bathroom & performs hand hygiene standing with supervision. Gait throughout unit without AD initially with CGA fade to close supervision with pt demonstrating decreased BUE reciprocal arm swing. Pt completes floor transfer with supervision. Pt engages in dual task of walking while bouncing ball then with additional cognitive task with max assist for cognitive portion for higher level gait training. Pt engages in wii balance board activity with focus on ankle reactions & weight shifting then [employer] soccer with focus on weight shifting L<>R, dynamic balance & reaction time with CGA<>min assist for balance during wii activities. Pt demonstrates significantly decreased reaction time to wii soccer & PT discussed implications of this in a functional setting & while driving (reiterated need to be cleared by MD before return to driving). Pt ambulates over ramp with close supervision, mulch & 1 step without rails with CGA with step simulating home environment. Pt performs alternating cone taps with CGA fade to supervision for dynamic balance, weight shifting & BLE coordination. Pt performs single leg stance and standing with narrow BOS with eyes closed for further balance training. Back in room pt requires mod assist to recall events of session for him to complete memory notebook entry. Pt left in recliner with BLE elevated, chair alarm donned, call bell in reach.  Therapy Documentation Precautions:  Precautions Precautions: Fall Precaution Comments: life vest Restrictions Weight Bearing Restrictions: No  Pain: Pt reports unrated soreness in chest 2/2 compressions with certain positions - repositioning & rest provided  for increased comfort.     Therapy/Group: Individual Therapy  [name] 03/14/2020, 9:14 AM

## 2020-03-14 NOTE — Progress Notes (Signed)
Physical Therapy Session Note  Patient Details  Name: Richard Hatfield MRN: [id number] Date of Birth: 1956-03-11  Today's Date: 03/14/2020 PT Individual Time: 1102-1159 PT Individual Time Calculation (min): 57 min   Short Term Goals: Week 1:  PT Short Term Goal 1 (Week 1): Pt will negotiate 1 step without rails with LRAD & mod assist. PT Short Term Goal 1 - Progress (Week 1): Met PT Short Term Goal 2 (Week 1): Pt will ambulate 75 ft with LRAD & supervision. PT Short Term Goal 2 - Progress (Week 1): Met PT Short Term Goal 3 (Week 1): Pt will demonstrate ability for day to day recall/carry over during functional activities with mod assist. PT Short Term Goal 3 - Progress (Week 1): Progressing toward goal  Skilled Therapeutic Interventions/Progress Updates: Pt presents sitting in recliner and agreeable to therapy.  Pt amb > 300' w/o AD down hallways to outside.  Pt able to negotiate turns w/ only leading questions for  direction.  Pt amb on uneven surfaces including bricks, mulch and 1 foot on curb w/o LOB. Pt performed transfers from various surfaces - bench, chair w/ and w/o arms w/ supervision.  Pt performed marching, sidestepping, over cushioned surfaces w/o LOB.  Pt performed floor transfer w/ CGA to supervision and verbal cues.  Pt amb back to room and returned to recliner w/ chair alarm on and all needs in reach.     Therapy Documentation Precautions:  Precautions Precautions: Fall Precaution Comments: life vest Restrictions Weight Bearing Restrictions: No General:   Vital Signs:  Pain: 0/10      Therapy/Group: Individual Therapy  Ladoris Gene 03/14/2020, 12:02 PM

## 2020-03-15 ENCOUNTER — Inpatient Hospital Stay (HOSPITAL_COMMUNITY): Payer: BC Managed Care – PPO | Admitting: Occupational Therapy

## 2020-03-15 ENCOUNTER — Inpatient Hospital Stay (HOSPITAL_COMMUNITY): Payer: BC Managed Care – PPO

## 2020-03-15 LAB — GLUCOSE, CAPILLARY
Glucose-Capillary: 148 mg/dL — ABNORMAL HIGH (ref 70–99)
Glucose-Capillary: 114 mg/dL — ABNORMAL HIGH (ref 70–99)
Glucose-Capillary: 132 mg/dL — ABNORMAL HIGH (ref 70–99)
Glucose-Capillary: 198 mg/dL — ABNORMAL HIGH (ref 70–99)

## 2020-03-15 NOTE — Progress Notes (Signed)
Occupational Therapy Weekly Progress Note  Patient Details  Name: Richard Hatfield MRN: [id number] Date of Birth: May 08, 1956  Beginning of progress report period: March 09, 2020 End of progress report period: March 15, 2020  Today's Date: 03/15/2020  Session 1 OT Individual Time: 1093-2355 OT Individual Time Calculation (min): 43 min   Session 2 OT Individual Time: 1416-1500 OT Individual Time Calculation (min): 44 min    Patient has met 3 of 3 short term goals.  Pt is making steady progress towards OT goals. Pt is at an overall supervision/CGA level for BADL tasks. Pt continues to be forgetful at times with life vest and needs cues for safety awareness. Pt participating well and working hard towards his goals.   Patient continues to demonstrate the following deficits: muscle weakness, decreased cardiorespiratoy endurance, decreased awareness, decreased safety awareness and decreased memory and decreased standing balance and decreased balance strategies and therefore will continue to benefit from skilled OT intervention to enhance overall performance with BADL.  Patient progressing toward long term goals..  Continue plan of care.  OT Short Term Goals Week 1:  OT Short Term Goal 1 (Week 1): Pt will complete LB dressing with min A OT Short Term Goal 1 - Progress (Week 1): Met OT Short Term Goal 2 (Week 1): Pt will maintain standing balance at the sink within BADL task with CGA OT Short Term Goal 2 - Progress (Week 1): Met OT Short Term Goal 3 (Week 1): Pt will tolerate standing activity for 5 mins in preparation for BADL tasks OT Short Term Goal 3 - Progress (Week 1): Met Week 2:  OT Short Term Goal 1 (Week 2): LTG=STG 2/2 ELOS  Skilled Therapeutic Interventions/Progress Updates:    Session 1 Pt greeted seated in recliner and agreeable to OT treatments session, pt reported he had just gone to the bathroom. Pt was already dressed and did not want to bathe today. Pt ambulated to the sink for  toothbrushing task. Pt ambulated in room w/ supervision and no AD. He maintained standing balance to brush and floss teeth with supervision. Pt distracted by cell phone notification and had slight LOB when quickly turning to locate phone requiring CGA to correct. OT educated on fall risk and slowing down movements. Pt ambulated to therapy dayroom and worked on standing endurance while participating in peg board and pipe tree puzzles. Pt with improved organization of visual-spatial relationships of pegs. Pt also demonstrated improved sequencing and organization by categorizing pipes by size prior to completing pipe treet puzzle. Pt returned to room at end of session and left seated in recliner with chair alarm belt on, call bell in reach, and needs met.   Session 2 Pt greeted seated in recliner and agreeable to OT treatment session. Pt reported he had just gone to the bathroom. Pt's spouse shared shower dimensions and pictures of walk-in shower. OT discussed with pt's spouse that he should be safe to sit on the built in bench at home. Discussed limiting shower to a few days a week to decrease the amount of time he is out Life Vest. Pt ambulated around unit with close supervision and intermittent CGA. UB there-ex on UE Ergometer. 2 sets of 5 minutes on level 6 and 7. Worked on dynamic balance and dual task activity with ambulating and maintaining balance of ball on flat saucer. Pt needed CGA while ambulating with this task. Pt returned to room and left seated in recliner with alarm belt on, call bell in reach, and  needs met.   Therapy Documentation Precautions:  Precautions Precautions: Fall Precaution Comments: life vest Restrictions Weight Bearing Restrictions: No Pain:  denies pain   Therapy/Group: Individual Therapy  Valma Cava 03/15/2020, 3:13 PM

## 2020-03-15 NOTE — Progress Notes (Signed)
[place] PHYSICAL MEDICINE & REHABILITATION PROGRESS NOTE  Subjective/Complaints: No complaints this morning.  ROS: Denies CP, shortness of breath, nausea, vomiting, diarrhea.  Objective: Vital Signs: Blood pressure 121/65, pulse 58, temperature 98.7 F (37.1 C), resp. rate 17, height 5\' 8"  (1.727 m), weight 85.7 kg, SpO2 99 %. No results found. No results for input(s): WBC, HGB, HCT, PLT in the last 72 hours. No results for input(s): NA, K, CL, CO2, GLUCOSE, BUN, CREATININE, CALCIUM in the last 72 hours.  Physical Exam: BP 121/65 (BP Location: Left Arm)   Pulse 58   Temp 98.7 F (37.1 C)   Resp 17   Ht 5\' 8"  (1.727 m)   Wt 85.7 kg   SpO2 99%   BMI 28.74 kg/m   General: Alert and oriented x 3, No apparent distress HEENT: Head is normocephalic, atraumatic, PERRLA, EOMI, sclera anicteric, oral mucosa pink and moist, dentition intact, ext ear canals clear,  Neck: Supple without JVD or lymphadenopathy Heart: Reg rate and rhythm. No murmurs rubs or gallops Chest: CTA bilaterally without wheezes, rales, or rhonchi; no distress Abdomen: Soft, non-tender, non-distended, bowel sounds positive. Extremities: No clubbing, cyanosis, or edema. Pulses are 2+ Skin: Left wrist ecchymosis.  Intact. Psych: Normal mood.  Normal behavior. Musc: No edema in extremities.  No tenderness in extremities. Neuro: Alert Motor: 5/5 grossly throughout, unchanged  Assessment/Plan: 1. Functional deficits secondary to anoxic brain injury which require 3+ hours per day of interdisciplinary therapy in a comprehensive inpatient rehab setting.  Physiatrist is providing close team supervision and 24 hour management of active medical problems listed below.  Physiatrist and rehab team continue to assess barriers to discharge/monitor patient progress toward functional and medical goals  Care Tool:  Bathing    Body parts bathed by patient: Left arm, Right arm, Abdomen, Front perineal area, Buttocks, Right  upper leg, Left upper leg, Face, Left lower leg, Right lower leg     Body parts n/a: Chest   Bathing assist Assist Level: Contact Guard/Touching assist     Upper Body Dressing/Undressing Upper body dressing   What is the patient wearing?: Pull over shirt    Upper body assist Assist Level: Set up assist    Lower Body Dressing/Undressing Lower body dressing      What is the patient wearing?: Underwear/pull up, Pants     Lower body assist Assist for lower body dressing: Contact Guard/Touching assist     Toileting Toileting    Toileting assist Assist for toileting: Contact Guard/Touching assist     Transfers Chair/bed transfer  Transfers assist     Chair/bed transfer assist level: Contact Guard/Touching assist Chair/bed transfer assistive device: Other (rollator)   Locomotion Ambulation   Ambulation assist      Assist level: Supervision/Verbal cueing Assistive device: No Device Max distance: >300'   Walk 10 feet activity   Assist     Assist level: Supervision/Verbal cueing Assistive device: No Device   Walk 50 feet activity   Assist    Assist level: Supervision/Verbal cueing Assistive device: No Device    Walk 150 feet activity   Assist    Assist level: Supervision/Verbal cueing Assistive device: No Device    Walk 10 feet on uneven surface  activity   Assist     Assist level: Contact Guard/Touching assist Assistive device:  (none)   Wheelchair     Assist Will patient use wheelchair at discharge?: No (potentially for community mobility) Type of Wheelchair: Manual    Wheelchair assist level:  Minimal Assistance - Patient > 75% Max wheelchair distance: 200 ft    Wheelchair 50 feet with 2 turns activity    Assist        Assist Level: Minimal Assistance - Patient > 75%   Wheelchair 150 feet activity     Assist     Assist Level: Minimal Assistance - Patient > 75%      Medical Problem List and Plan: 1.   Impaired function secondary to anoxic brain injury from prolonged CPR/STEMIsp cardiac arrest  Continue CIR 2.  Antithrombotics: -DVT/anticoagulation:  Pharmaceutical: Lovenox--d/c as patient is ambulating >300 feet             -antiplatelet therapy: On ASA/Plavix.  3. Pain Management: N/a 4. Mood: LCSW to follow for evaluation and support. Positive mood.             -antipsychotic agents: N/a 5. Neuropsych: This patient is not capable of making decisions on his own behalf.   Continue telesitter for safety  6. Skin/Wound Care: Monitor chest wall for healing.  7. Fluids/Electrolytes/Nutrition: Monitor I/Os. Added prosource for supplement and multivitamin. On soft diet  8. ACS with diffuse CAD: Treated medically with ASA/Plavix/Avapro/lopressor/crestor.  9. NSVT: Life vest to be placed prior to admission. On low dose lopressor bid.  7/29 well controlled 10. T2DM with hyperglycemia: Hgb A1c- 8.2.  PTA was on Tresiba 90 units, novolog 8 units tidac, Jardince 25 mg and metformin 1000 mg/500 mg.   Added carb modified restrictions to meals   Resumed Jardiance   Lantus 15 twice daily started on 7/26  NovoLog 8 3 times daily started on 7/25  7/29: well controlled  Monitor the increased mobility 11. Abnormal LFTs:   LFTs elevated, slowly improving on 7/26 12. Indigestion/GERD- was on Prilosec 20 mg daily at home-  Ordered Ca Carbonate as well as Protonix 20 mg daily 13.  Labile blood pressure  Stabilizing on 7/25 14.  AKI  Creatinine 0.98 on 7/26, improving  Encourage fluids 15.  Leukocytosis: Resolved  WBCs 7.6 on 7/26  Afebrile  LOS: 7 days A FACE TO FACE EVALUATION WAS PERFORMED  Dennis Hegeman P Skarleth [place] 03/15/2020, 2:26 PM [place] PHYSICAL MEDICINE & REHABILITATION PROGRESS NOTE  Subjective/Complaints: No complaints this morning.  Vitals stable Sleeping well at night Denies pain  ROS: Denies CP, shortness of breath, nausea, vomiting, diarrhea.  Objective: Vital Signs: Blood  pressure 121/65, pulse 58, temperature 98.7 F (37.1 C), resp. rate 17, height 5\' 8"  (1.727 m), weight 85.7 kg, SpO2 99 %. No results found. No results for input(s): WBC, HGB, HCT, PLT in the last 72 hours. No results for input(s): NA, K, CL, CO2, GLUCOSE, BUN, CREATININE, CALCIUM in the last 72 hours.  Physical Exam: BP 121/65 (BP Location: Left Arm)   Pulse 58   Temp 98.7 F (37.1 C)   Resp 17   Ht 5\' 8"  (1.727 m)   Wt 85.7 kg   SpO2 99%   BMI 28.74 kg/m   General: Alert and oriented x 3, No apparent distress HEENT: Head is normocephalic, atraumatic, PERRLA, EOMI, sclera anicteric, oral mucosa pink and moist, dentition intact, ext ear canals clear,  Neck: Supple without JVD or lymphadenopathy Heart: Reg rate and rhythm. No murmurs rubs or gallops Chest: CTA bilaterally without wheezes, rales, or rhonchi; no distress Abdomen: Soft, non-tender, non-distended, bowel sounds positive. Extremities: No clubbing, cyanosis, or edema. Pulses are 2+ Skin: Left wrist ecchymosis.  Intact. Psych: Normal mood.  Normal behavior. Musc: No edema  in extremities.  No tenderness in extremities. Neuro: Alert Motor: 5/5 grossly throughout, unchanged   Assessment/Plan: 1. Functional deficits secondary to anoxic brain injury which require 3+ hours per day of interdisciplinary therapy in a comprehensive inpatient rehab setting.  Physiatrist is providing close team supervision and 24 hour management of active medical problems listed below.  Physiatrist and rehab team continue to assess barriers to discharge/monitor patient progress toward functional and medical goals  Care Tool:  Bathing    Body parts bathed by patient: Left arm, Right arm, Abdomen, Front perineal area, Buttocks, Right upper leg, Left upper leg, Face, Left lower leg, Right lower leg     Body parts n/a: Chest   Bathing assist Assist Level: Contact Guard/Touching assist     Upper Body Dressing/Undressing Upper body dressing    What is the patient wearing?: Pull over shirt    Upper body assist Assist Level: Set up assist    Lower Body Dressing/Undressing Lower body dressing      What is the patient wearing?: Underwear/pull up, Pants     Lower body assist Assist for lower body dressing: Contact Guard/Touching assist     Toileting Toileting    Toileting assist Assist for toileting: Contact Guard/Touching assist     Transfers Chair/bed transfer  Transfers assist     Chair/bed transfer assist level: Contact Guard/Touching assist Chair/bed transfer assistive device: Other (rollator)   Locomotion Ambulation   Ambulation assist      Assist level: Supervision/Verbal cueing Assistive device: No Device Max distance: >300'   Walk 10 feet activity   Assist     Assist level: Supervision/Verbal cueing Assistive device: No Device   Walk 50 feet activity   Assist    Assist level: Supervision/Verbal cueing Assistive device: No Device    Walk 150 feet activity   Assist    Assist level: Supervision/Verbal cueing Assistive device: No Device    Walk 10 feet on uneven surface  activity   Assist     Assist level: Contact Guard/Touching assist Assistive device:  (none)   Wheelchair     Assist Will patient use wheelchair at discharge?: No (potentially for community mobility) Type of Wheelchair: Manual    Wheelchair assist level: Minimal Assistance - Patient > 75% Max wheelchair distance: 200 ft    Wheelchair 50 feet with 2 turns activity    Assist        Assist Level: Minimal Assistance - Patient > 75%   Wheelchair 150 feet activity     Assist     Assist Level: Minimal Assistance - Patient > 75%      Medical Problem List and Plan: 1.  Impaired function secondary to anoxic brain injury from prolonged CPR/STEMIsp cardiac arrest  Continue CIR 2.  Antithrombotics: -DVT/anticoagulation:  Pharmaceutical: Lovenox--d/c as patient is ambulating >300  feet             -antiplatelet therapy: On ASA/Plavix.  3. Pain Management: N/a 4. Mood: LCSW to follow for evaluation and support. Positive mood.             -antipsychotic agents: N/a 5. Neuropsych: This patient is not capable of making decisions on his own behalf.   Continue telesitter for safety  6. Skin/Wound Care: Monitor chest wall for healing.  7. Fluids/Electrolytes/Nutrition: Monitor I/Os. Added prosource for supplement and multivitamin. On soft diet  8. ACS with diffuse CAD: Treated medically with ASA/Plavix/Avapro/lopressor/crestor.  9. NSVT: Life vest to be placed prior to admission. On low dose  lopressor bid.  Controlled to low on 7/27 10. T2DM with hyperglycemia: Hgb A1c- 8.2.  PTA was on Tresiba 90 units, novolog 8 units tidac, Jardince 25 mg and metformin 1000 mg/500 mg.   Added carb modified restrictions to meals   Resumed Jardiance   Lantus 15 twice daily started on 7/26  NovoLog 8 3 times daily started on 7/25  Remains elevated on 7/4, will consider restarting other medications if persistently elevated  Monitor the increased mobility 11. Abnormal LFTs:   LFTs elevated, slowly improving on 7/26 12. Indigestion/GERD- was on Prilosec 20 mg daily at home-  Ordered Ca Carbonate as well as Protonix 20 mg daily 13.  Labile blood pressure  Stabilizing on 7/25 14.  AKI  Creatinine 0.98 on 7/26, improving  Encourage fluids 15.  Leukocytosis: Resolved  WBCs 7.6 on 7/26  Afebrile  LOS: 7 days A FACE TO FACE EVALUATION WAS PERFORMED  Martha Clan P Jarissa Sheriff 03/15/2020, 2:27 PM

## 2020-03-15 NOTE — Progress Notes (Signed)
Physical Therapy Session Note  Patient Details  Name: Richard Hatfield MRN: [id number] Date of Birth: 02-Jul-1956  Today's Date: 03/15/2020 PT Individual Time: 1004-1059 PT Individual Time Calculation (min): 55 min   Short Term Goals: Week 1:  PT Short Term Goal 1 (Week 1): Pt will negotiate 1 step without rails with LRAD & mod assist. PT Short Term Goal 1 - Progress (Week 1): Met PT Short Term Goal 2 (Week 1): Pt will ambulate 75 ft with LRAD & supervision. PT Short Term Goal 2 - Progress (Week 1): Met PT Short Term Goal 3 (Week 1): Pt will demonstrate ability for day to day recall/carry over during functional activities with mod assist. PT Short Term Goal 3 - Progress (Week 1): Progressing toward goal  Skilled Therapeutic Interventions/Progress Updates:     Received seated in recliner and agreeable to therapy. No report of pain. Sit to stand with supervision for safety. Pt ambulates 300'+ with cues for upright gaze to improve posture and balance, and pt demonstrating slight unsteadiness during turns, but no LOBs. Pt performs biodex and dynavision for standing NMR. With dynavision pt starts with BUE support progressing to single UE support progressing to no UE support. Initially pt performs lateral weight shifting and then anterior/posterior weight shifting. PT cues for use of hip strategy to improve weight shifting.   PT educates on mental checklist in case pt falls in home. Pt verbalizes understanding. Pt performs floor transfer with supervision for cues on positioning and sequencing for safety.   Pt ambulates forward and backward x15' each around cone to challenge dynamic balance. PT cues for increased gait speed and bilateral stride length. Pt ambulates 200' back to room. Left seated in recliner with alarm intact and all needs within reach.  Therapy Documentation Precautions:  Precautions Precautions: Fall Precaution Comments: life vest Restrictions Weight Bearing Restrictions:  No   Therapy/Group: Individual Therapy  Breck Coons, PT, DPT 03/15/2020, 11:00 AM

## 2020-03-15 NOTE — Progress Notes (Signed)
Speech Language Pathology Daily Session Note  Patient Details  Name: Richard Hatfield MRN: [id number] Date of Birth: 10-03-1955  Today's Date: 03/15/2020 SLP Individual Time: 1300-1345 SLP Individual Time Calculation (min): 45 min  Short Term Goals: Week 1: SLP Short Term Goal 1 (Week 1): Patient will recall new, daily information with Mod A verbal and visual cues. SLP Short Term Goal 2 (Week 1): Patient will demonstrate sustained attention to functional tasks for ~10 minutes with Mod A verbal cues for redirection SLP Short Term Goal 3 (Week 1): Patient will demonstrate functional problem solving for functional and familiar tasks with Mod A multimodal cues. SLP Short Term Goal 4 (Week 1): Patient will self-monitor and correct errors during functional tasks with Mod A verbal cues. SLP Short Term Goal 5 (Week 1): Patient will utilize a slow speech rate to maximize intelligibility to ~100% at the conversation level with Mod verbal cues.  Skilled Therapeutic Interventions:   Patient seen to address cognitive-linguistic goals with focus on memory, time orientation and awareness. Patient stated month and date as "August" and "30th". He was able to recall and correctly describe three specific tasks completed in therapies earlier today. Patient casually mentioned that he had gone home the previous day with his wife to see the cats then came back. This resulted in discussion regarding timeline of events, patients difficulty with recall as well as memories seemingly getting placed incorrectly in his timeline. Initially, patient seemed aware that it was Summer and that he had been in the hospital for 2 weeks, but then he started saying he had been in the hospital for 6 months and that it was currently the month of February. Patient has no awareness to these errors and does not demonstrate awareness to absurdities and inconsistencies in what he is saying, even when clinician describes. SLP spoke briefly with  patient and wife at end of session regarding his memory impairment. Pain Pain Assessment Pain Scale: 0-10 Pain Score: 0-No pain  Therapy/Group: Individual Therapy  Sonia Baller, MA, CCC-SLP Speech Therapy [facility]

## 2020-03-16 ENCOUNTER — Encounter (HOSPITAL_COMMUNITY): Payer: BC Managed Care – PPO

## 2020-03-16 ENCOUNTER — Ambulatory Visit (HOSPITAL_COMMUNITY): Payer: BC Managed Care – PPO | Admitting: Physical Therapy

## 2020-03-16 LAB — GLUCOSE, CAPILLARY
Glucose-Capillary: 137 mg/dL — ABNORMAL HIGH (ref 70–99)
Glucose-Capillary: 186 mg/dL — ABNORMAL HIGH (ref 70–99)
Glucose-Capillary: 208 mg/dL — ABNORMAL HIGH (ref 70–99)
Glucose-Capillary: 208 mg/dL — ABNORMAL HIGH (ref 70–99)

## 2020-03-16 NOTE — Progress Notes (Signed)
A&O with confusion. Telesitter in place. No glycemic episodes this shift. Frequent redirection required, but easily redirected. Attended therapy as scheduled.

## 2020-03-16 NOTE — Plan of Care (Signed)
  Problem: RH Expression Communication Goal: LTG Patient will increase speech intelligibility (SLP) Description: LTG: Patient will increase speech intelligibility at word/phrase/conversation level with cues, % of the time (SLP) Outcome: Not Applicable Note: Baseline fast rate

## 2020-03-16 NOTE — Progress Notes (Signed)
Physical Therapy Session Note  Patient Details  Name: Richard Hatfield MRN: [id number] Date of Birth: 10-May-1956  Today's Date: 03/16/2020 PT Individual Time: 7035-0093 PT Individual Time Calculation (min): 74 min   Short Term Goals: Week 2:  PT Short Term Goal 1 (Week 2): STG = LTG due to estimated d/c date.  Skilled Therapeutic Interventions/Progress Updates:  Pt received in recliner with wife [name]) present for caregiver education. Educated pt & [name] on pt's impaired memory & safety awareness & implications for how this affects pt's functional mobility. Reviewed safety measures in home (mats on floor by bed if they want, use of bell/baby monitor, baby gate as needed for a visual reminder, remove/secure throw rugs, ensure pets do not get underfoot) & community. Pt ambulates around unit without AD & supervision, negotiating ramp & mulch without AD & supervision, curb step without AD & UE support & supervision, and 12 steps (6") with L ascending rail & supervision. Pt completes bed mobility in apartment & furniture transfers from [place] with supervision. Reviewed fall recovery & instances when he should call EMS; pt completes floor transfer with supervision. Pt ambulates outdoors over uneven surfaces, inclines, and steps to simulate community mobility & endurance training. During session pt does use restroom, standing with supervision & continent void. Pt's wife checked off to assist him with ambulation in the room & bathroom - safety plan updated accordingly (also reviewed how to turn on/off alarm belt with pt's wife). Pt left in recliner with chair alarm donned & all needs in reach.   Therapy Documentation Precautions:  Precautions Precautions: Fall Precaution Comments: life vest Restrictions Weight Bearing Restrictions: No  Pain: Pt reports intermittent chest soreness 2/2 compressions - repositioning for pain relief PRN & educated pt on use of pillow when coughing/sneezing to help  decrease pain.   Therapy/Group: Individual Therapy  [name] 03/16/2020, 12:16 PM

## 2020-03-16 NOTE — Progress Notes (Signed)
Patient ID: Richard Hatfield, male   DOB: 09/03/1955, 64 y.o.   MRN: [id number]  Patient and spouse changed Life Vest battery. Battery is now fully charged and previous battery is now on Engineer, site.  Dorthula Nettles, RN, BSN, [place] [facility] [phone] Cell [phone]

## 2020-03-16 NOTE — Progress Notes (Signed)
Occupational Therapy Session Note  Patient Details  Name: Richard Hatfield MRN: [id number] Date of Birth: 1956-02-16  Today's Date: 03/16/2020 OT Individual Time: 1500-1600 OT Individual Time Calculation (min): 60 min    Short Term Goals: Week 2:  OT Short Term Goal 1 (Week 2): LTG=STG 2/2 ELOS  Skilled Therapeutic Interventions/Progress Updates:    OT session focused on ADL retraining, balance, activity tolerance, and family education. Wife present throughout the session, assisting with managing Life Vest and providing supervision for bathing and dressing. Pt completed bathing at shower level with OT educating on max timeframe of Life Vest off for bathing, sequencing doffing/donnig, and placement of vest during shower. Wife demonstrates good skill with managing and reported no additional questions. Pt completed shaving in standing x5 min with supervision and no rest breaks. Educated on energy conservation techniques with daily activities and when scheduling doctor/therapy appointments. Educated on home modifications, specifically with presenting visual cue (stop sign, contrasting tape, etc) for 1-step into preferred room of the house. At end of session, pt left sitting in recliner with safety belt donned and all needs in reach. No additional questions reported from pt or wife.   Therapy Documentation Precautions:  Precautions Precautions: Fall Precaution Comments: life vest Restrictions Weight Bearing Restrictions: No General:   Vital Signs: Therapy Vitals Temp: 97.7 F (36.5 C) Temp Source: Oral Pulse Rate: 61 Resp: 18 BP: 106/68 Patient Position (if appropriate): Sitting Oxygen Therapy SpO2: 98 % O2 Device: Room Air Pain:   ADL: ADL Eating: Minimal assistance Grooming: Minimal assistance Upper Body Bathing: Minimal assistance Lower Body Bathing: Moderate assistance Upper Body Dressing: Minimal assistance Lower Body Dressing: Moderate assistance Toileting: Minimal  assistance Toilet Transfer: Moderate assistance Vision   Perception    Praxis   Exercises:   Other Treatments:     Therapy/Group: Individual Therapy  Duayne Cal 03/16/2020, 3:50 PM

## 2020-03-16 NOTE — Progress Notes (Signed)
Speech Language Pathology Weekly Progress and Session Note  Patient Details  Name: Richard Hatfield MRN: [id number] Date of Birth: [birth year]  Beginning of progress report period: March 16, 2020 End of progress report period: March 16, 2020  Today's Date: 03/16/2020 SLP Individual Time: 1335-1430 SLP Individual Time Calculation (min): 55 min  Short Term Goals: Week 1: SLP Short Term Goal 1 (Week 1): Patient will recall new, daily information with Mod A verbal and visual cues. SLP Short Term Goal 1 - Progress (Week 1): Met SLP Short Term Goal 2 (Week 1): Patient will demonstrate sustained attention to functional tasks for ~10 minutes with Mod A verbal cues for redirection SLP Short Term Goal 2 - Progress (Week 1): Met SLP Short Term Goal 3 (Week 1): Patient will demonstrate functional problem solving for functional and familiar tasks with Mod A multimodal cues. SLP Short Term Goal 3 - Progress (Week 1): Met SLP Short Term Goal 4 (Week 1): Patient will self-monitor and correct errors during functional tasks with Mod A verbal cues. SLP Short Term Goal 4 - Progress (Week 1): Met SLP Short Term Goal 5 (Week 1): Patient will utilize a slow speech rate to maximize intelligibility to ~100% at the conversation level with Mod verbal cues. SLP Short Term Goal 5 - Progress (Week 1): Discontinued (comment) (family states baseline rate)    New Short Term Goals: Week 2: SLP Short Term Goal 1 (Week 2): STG=LTG due to short ELOS  Weekly Progress Updates: Pt made great progress meeting 4 out 4 goals, 1 goal speech intelligibility was discontinued due to baseline fast rate. Pt demonstrated great improvement in use of external recall strategies (memory notebook), basic to now semi-complex problem solving, emergent awareness, and sustained/selective attention. Pt continues to demonstrate deficits impacting safety awareness, mildly complex problem solving, short term recall and attention. Family education has been  on going with pt's wife. Pt would continue to benefit from skills ST services in order to maximize functional independence and reduce burden of care, requiring 24 hour supervision and continued ST services.      Intensity: Minumum of 1-2 x/day, 30 to 90 minutes Frequency: 3 to 5 out of 7 days Duration/Length of Stay: 8/3 Treatment/Interventions: Cognitive remediation/compensation;Internal/external aids;Speech/Language facilitation;Therapeutic Activities;Environmental controls;Cueing hierarchy;Patient/family education;Functional tasks   Daily Session  Skilled Therapeutic Interventions: Skilled ST services focused on education and cognitive skills. SLP facilitated functional problem solving and error awareness skills utilizing ALFA money /medication management pt required supervision A verbal cues for problem solving and initial cue to utilize written aid. Pt demonstrated basic increase to semi-complex problem solving skills in novel card task, pt required supervision A verbal cues fade to mod I. SLP also facilitated semi-complex problem solving skills and notetaking for recall skills in inferencing and organization task, requiring supervision A verbal cues. Pt required mod A fade to min A verbal cues to recall events from today's session. Education was completed with pt's wife pertaining to continued deficits in attention, memory, problem solving and safety awareness. All questions answered to satisfaction. Pt was left in room with wife and chair alarm set. Recommend to continue skilled ST services.     General    Pain Pain Assessment Pain Score: 0-No pain  Therapy/Group: Individual Therapy  Coti Burd  [facility] 03/16/2020, 4:54 PM

## 2020-03-16 NOTE — Progress Notes (Signed)
[facility] PHYSICAL MEDICINE & REHABILITATION PROGRESS NOTE  Subjective/Complaints: No complaints. Up at the nursing station this morning doing a crossword puzzle.   ROS: Denies CP, shortness of breath, nausea, vomiting, diarrhea.  Objective: Vital Signs: Blood pressure 106/68, pulse 61, temperature 97.7 F (36.5 C), temperature source Oral, resp. rate 18, height 5\' 8"  (1.727 m), weight 86.2 kg, SpO2 98 %. No results found. No results for input(s): WBC, HGB, HCT, PLT in the last 72 hours. No results for input(s): NA, K, CL, CO2, GLUCOSE, BUN, CREATININE, CALCIUM in the last 72 hours.  Physical Exam: BP 106/68 (BP Location: Left Arm)   Pulse 61   Temp 97.7 F (36.5 C) (Oral)   Resp 18   Ht 5\' 8"  (1.727 m)   Wt 86.2 kg   SpO2 98%   BMI 28.89 kg/m   General: Alert and oriented x 3, No apparent distress HEENT: Head is normocephalic, atraumatic, PERRLA, EOMI, sclera anicteric, oral mucosa pink and moist, dentition intact, ext ear canals clear,  Neck: Supple without JVD or lymphadenopathy Heart: Reg rate and rhythm. No murmurs rubs or gallops Chest: CTA bilaterally without wheezes, rales, or rhonchi; no distress Abdomen: Soft, non-tender, non-distended, bowel sounds positive. Extremities: No clubbing, cyanosis, or edema. Pulses are 2+ Skin: Left wrist ecchymosis.  Intact. Psych: Normal mood.  Normal behavior. Musc: No edema in extremities.  No tenderness in extremities. Neuro: Alert Motor: 5/5 grossly throughout, unchanged  Assessment/Plan: 1. Functional deficits secondary to anoxic brain injury which require 3+ hours per day of interdisciplinary therapy in a comprehensive inpatient rehab setting.  Physiatrist is providing close team supervision and 24 hour management of active medical problems listed below.  Physiatrist and rehab team continue to assess barriers to discharge/monitor patient progress toward functional and medical goals  Care Tool:  Bathing    Body parts  bathed by patient: Left arm, Right arm, Abdomen, Front perineal area, Buttocks, Right upper leg, Left upper leg, Face, Left lower leg, Right lower leg     Body parts n/a: Chest   Bathing assist Assist Level: Contact Guard/Touching assist     Upper Body Dressing/Undressing Upper body dressing   What is the patient wearing?: Pull over shirt    Upper body assist Assist Level: Set up assist    Lower Body Dressing/Undressing Lower body dressing      What is the patient wearing?: Underwear/pull up, Pants     Lower body assist Assist for lower body dressing: Contact Guard/Touching assist     Toileting Toileting    Toileting assist Assist for toileting: Contact Guard/Touching assist     Transfers Chair/bed transfer  Transfers assist     Chair/bed transfer assist level: Supervision/Verbal cueing Chair/bed transfer assistive device: Other (rollator)   Locomotion Ambulation   Ambulation assist      Assist level: Supervision/Verbal cueing Assistive device: No Device Max distance: >300'   Walk 10 feet activity   Assist     Assist level: Supervision/Verbal cueing Assistive device: No Device   Walk 50 feet activity   Assist    Assist level: Supervision/Verbal cueing Assistive device: No Device    Walk 150 feet activity   Assist    Assist level: Supervision/Verbal cueing Assistive device: Hand held assist    Walk 10 feet on uneven surface  activity   Assist     Assist level: Supervision/Verbal cueing Assistive device:  (none)   Wheelchair     Assist Will patient use wheelchair at discharge?: No (potentially  for community mobility) Type of Wheelchair: Manual    Wheelchair assist level: Minimal Assistance - Patient > 75% Max wheelchair distance: 200 ft    Wheelchair 50 feet with 2 turns activity    Assist        Assist Level: Minimal Assistance - Patient > 75%   Wheelchair 150 feet activity     Assist     Assist  Level: Minimal Assistance - Patient > 75%      Medical Problem List and Plan: 1.  Impaired function secondary to anoxic brain injury from prolonged CPR/STEMIsp cardiac arrest  Continue CIR 2.  Antithrombotics: -DVT/anticoagulation:  Pharmaceutical: Lovenox--d/c as patient is ambulating >300 feet             -antiplatelet therapy: On ASA/Plavix.  3. Pain Management: N/a 4. Mood: LCSW to follow for evaluation and support. Positive mood.              -antipsychotic agents: N/a 5. Neuropsych: This patient is not capable of making decisions on his own behalf.   Continue telesitter for safety  6. Skin/Wound Care: Monitor chest wall for healing.  7. Fluids/Electrolytes/Nutrition: Monitor I/Os. Added prosource for supplement and multivitamin. On soft diet  8. ACS with diffuse CAD: Treated medically with ASA/Plavix/Avapro/lopressor/crestor.  9. NSVT: Life vest to be placed prior to admission. On low dose lopressor bid.  7/30: well controlled 10. T2DM with hyperglycemia: Hgb A1c- 8.2.  PTA was on Tresiba 90 units, novolog 8 units tidac, Jardince 25 mg and metformin 1000 mg/500 mg.   Added carb modified restrictions to meals   Resumed Jardiance   Lantus 15 twice daily started on 7/26  NovoLog 8 3 times daily started on 7/25  7/30: slightly elevated, continue to monitor.   Monitor the increased mobility 11. Abnormal LFTs:   LFTs elevated, slowly improving on 7/26 12. Indigestion/GERD- was on Prilosec 20 mg daily at home-  Ordered Ca Carbonate as well as Protonix 20 mg daily 13.  Labile blood pressure  Stabilizing on 7/25 14.  AKI  Creatinine 0.98 on 7/26, improving  Encourage fluids 15.  Leukocytosis: Resolved  WBCs 7.6 on 7/26  Afebrile  LOS: 8 days A FACE TO FACE EVALUATION WAS PERFORMED  Richard Hatfield P Jakeya Gherardi 03/16/2020, 1:19 PM [facility] PHYSICAL MEDICINE & REHABILITATION PROGRESS NOTE  Subjective/Complaints: No complaints this morning.  Vitals stable Sleeping well at  night Denies pain  ROS: Denies CP, shortness of breath, nausea, vomiting, diarrhea.  Objective: Vital Signs: Blood pressure 106/68, pulse 61, temperature 97.7 F (36.5 C), temperature source Oral, resp. rate 18, height 5\' 8"  (1.727 m), weight 86.2 kg, SpO2 98 %. No results found. No results for input(s): WBC, HGB, HCT, PLT in the last 72 hours. No results for input(s): NA, K, CL, CO2, GLUCOSE, BUN, CREATININE, CALCIUM in the last 72 hours.  Physical Exam: BP 106/68 (BP Location: Left Arm)   Pulse 61   Temp 97.7 F (36.5 C) (Oral)   Resp 18   Ht 5\' 8"  (1.727 m)   Wt 86.2 kg   SpO2 98%   BMI 28.89 kg/m   General: Alert and oriented x 3, No apparent distress HEENT: Head is normocephalic, atraumatic, PERRLA, EOMI, sclera anicteric, oral mucosa pink and moist, dentition intact, ext ear canals clear,  Neck: Supple without JVD or lymphadenopathy Heart: Reg rate and rhythm. No murmurs rubs or gallops Chest: CTA bilaterally without wheezes, rales, or rhonchi; no distress Abdomen: Soft, non-tender, non-distended, bowel sounds positive. Extremities: No  clubbing, cyanosis, or edema. Pulses are 2+ Skin: Left wrist ecchymosis.  Intact. Psych: Normal mood.  Normal behavior. Musc: No edema in extremities.  No tenderness in extremities. Neuro: Alert Motor: 5/5 grossly throughout, unchanged   Assessment/Plan: 1. Functional deficits secondary to anoxic brain injury which require 3+ hours per day of interdisciplinary therapy in a comprehensive inpatient rehab setting.  Physiatrist is providing close team supervision and 24 hour management of active medical problems listed below.  Physiatrist and rehab team continue to assess barriers to discharge/monitor patient progress toward functional and medical goals  Care Tool:  Bathing    Body parts bathed by patient: Left arm, Right arm, Abdomen, Front perineal area, Buttocks, Right upper leg, Left upper leg, Face, Left lower leg, Right lower  leg     Body parts n/a: Chest   Bathing assist Assist Level: Contact Guard/Touching assist     Upper Body Dressing/Undressing Upper body dressing   What is the patient wearing?: Pull over shirt    Upper body assist Assist Level: Set up assist    Lower Body Dressing/Undressing Lower body dressing      What is the patient wearing?: Underwear/pull up, Pants     Lower body assist Assist for lower body dressing: Contact Guard/Touching assist     Toileting Toileting    Toileting assist Assist for toileting: Contact Guard/Touching assist     Transfers Chair/bed transfer  Transfers assist     Chair/bed transfer assist level: Supervision/Verbal cueing Chair/bed transfer assistive device: Other (rollator)   Locomotion Ambulation   Ambulation assist      Assist level: Supervision/Verbal cueing Assistive device: No Device Max distance: >300'   Walk 10 feet activity   Assist     Assist level: Supervision/Verbal cueing Assistive device: No Device   Walk 50 feet activity   Assist    Assist level: Supervision/Verbal cueing Assistive device: No Device    Walk 150 feet activity   Assist    Assist level: Supervision/Verbal cueing Assistive device: Hand held assist    Walk 10 feet on uneven surface  activity   Assist     Assist level: Supervision/Verbal cueing Assistive device:  (none)   Wheelchair     Assist Will patient use wheelchair at discharge?: No (potentially for community mobility) Type of Wheelchair: Manual    Wheelchair assist level: Minimal Assistance - Patient > 75% Max wheelchair distance: 200 ft    Wheelchair 50 feet with 2 turns activity    Assist        Assist Level: Minimal Assistance - Patient > 75%   Wheelchair 150 feet activity     Assist     Assist Level: Minimal Assistance - Patient > 75%      Medical Problem List and Plan: 1.  Impaired function secondary to anoxic brain injury from  prolonged CPR/STEMIsp cardiac arrest  Continue CIR 2.  Antithrombotics: -DVT/anticoagulation:  Pharmaceutical: Lovenox--d/c as patient is ambulating >300 feet             -antiplatelet therapy: On ASA/Plavix.  3. Pain Management: N/a 4. Mood: LCSW to follow for evaluation and support. Positive mood.             -antipsychotic agents: N/a 5. Neuropsych: This patient is not capable of making decisions on his own behalf.   Continue telesitter for safety  6. Skin/Wound Care: Monitor chest wall for healing.  7. Fluids/Electrolytes/Nutrition: Monitor I/Os. Added prosource for supplement and multivitamin. On soft diet  8. ACS  with diffuse CAD: Treated medically with ASA/Plavix/Avapro/lopressor/crestor.  9. NSVT: Life vest to be placed prior to admission. On low dose lopressor bid.  Controlled to low on 7/27 10. T2DM with hyperglycemia: Hgb A1c- 8.2.  PTA was on Tresiba 90 units, novolog 8 units tidac, Jardince 25 mg and metformin 1000 mg/500 mg.   Added carb modified restrictions to meals   Resumed Jardiance   Lantus 15 twice daily started on 7/26  NovoLog 8 3 times daily started on 7/25  Remains elevated on 7/4, will consider restarting other medications if persistently elevated  Monitor the increased mobility 11. Abnormal LFTs:   LFTs elevated, slowly improving on 7/26 12. Indigestion/GERD- was on Prilosec 20 mg daily at home-  Ordered Ca Carbonate as well as Protonix 20 mg daily 13.  Labile blood pressure  Stabilizing on 7/25 14.  AKI  Creatinine 0.98 on 7/26, improving  Encourage fluids 15.  Leukocytosis: Resolved  WBCs 7.6 on 7/26  Afebrile  LOS: 8 days A FACE TO FACE EVALUATION WAS PERFORMED  Nickcole Bralley P Nyx Keady 03/16/2020, 1:19 PM

## 2020-03-17 ENCOUNTER — Inpatient Hospital Stay (HOSPITAL_COMMUNITY): Payer: BC Managed Care – PPO | Admitting: Physical Therapy

## 2020-03-17 ENCOUNTER — Inpatient Hospital Stay (HOSPITAL_COMMUNITY): Payer: BC Managed Care – PPO

## 2020-03-17 LAB — GLUCOSE, CAPILLARY
Glucose-Capillary: 133 mg/dL — ABNORMAL HIGH (ref 70–99)
Glucose-Capillary: 145 mg/dL — ABNORMAL HIGH (ref 70–99)
Glucose-Capillary: 212 mg/dL — ABNORMAL HIGH (ref 70–99)
Glucose-Capillary: 207 mg/dL — ABNORMAL HIGH (ref 70–99)

## 2020-03-17 NOTE — Progress Notes (Signed)
Occupational Therapy Session Note  Patient Details  Name: Richard Hatfield MRN: [id number] Date of Birth: 1955/11/27  Today's Date: 03/17/2020 OT Individual Time: 2952-8413 OT Individual Time Calculation (min): 40 min    Short Term Goals: Week 1:  OT Short Term Goal 1 (Week 1): Pt will complete LB dressing with min A OT Short Term Goal 1 - Progress (Week 1): Met OT Short Term Goal 2 (Week 1): Pt will maintain standing balance at the sink within BADL task with CGA OT Short Term Goal 2 - Progress (Week 1): Met OT Short Term Goal 3 (Week 1): Pt will tolerate standing activity for 5 mins in preparation for BADL tasks OT Short Term Goal 3 - Progress (Week 1): Met  Skilled Therapeutic Interventions/Progress Updates:    1;1. Pt received in recliner with RNpresent. Pt agreeable to bathing and dressing this date. Pt able to doff and don life vest with MIN VC and cuing to hook up to charger. Pt bathes seated for allbody parts except standing peri/buttock bathing. Pt limited shower to less than 5 min without vest on without cues. Pt completes dressing with VC for seated LB threading of LEs. Pt completes grooming routine standing with setup. Pt completes standing toileting 2x for continent bladder void. Pt recorded all activities in memory notebook. Exited session with pt seated in recliner,   Therapy Documentation Precautions:  Precautions Precautions: Fall Precaution Comments: life vest Restrictions Weight Bearing Restrictions: No General:   Vital Signs:  Pain:   ADL: ADL Eating: Minimal assistance Grooming: Minimal assistance Upper Body Bathing: Minimal assistance Lower Body Bathing: Moderate assistance Upper Body Dressing: Minimal assistance Lower Body Dressing: Moderate assistance Toileting: Minimal assistance Toilet Transfer: Moderate assistance Vision   Perception    Praxis   Exercises:   Other Treatments:     Therapy/Group: Individual Therapy  Tonny Branch 03/17/2020, 9:41 AM

## 2020-03-17 NOTE — Progress Notes (Signed)
Physical Therapy Session Note  Patient Details  Name: Richard Hatfield MRN: [id number] Date of Birth: 1956/01/01  Today's Date: 03/17/2020 PT Individual Time: 0714-0812 PT Individual Time Calculation (min): 58 min   Short Term Goals: Week 2:  PT Short Term Goal 1 (Week 2): STG = LTG due to estimated d/c date.  Skilled Therapeutic Interventions/Progress Updates:  Pt received in bathroom with supervisoin from nursing staff & handed off to PT. Pt dons shorts standing with CGA with PT instructing him on need for increased safety & to sit to thread shorts on BLE. Pt dons shoes sitting in chair with set up assist. Gait around unit without AD with supervision. Pt completes Berg Balance Test & scores 40/56; educated pt on interpretation of score & current fall risk. Patient demonstrates increased fall risk as noted by score of 40/56 on Berg Balance Scale.  (<36= high risk for falls, close to 100%; 37-45 significant >80%; 46-51 moderate >50%; 52-55 lower >25%). Pt engages in trampoline ball toss while standing on air ex pad with CGA & focus on balance, adding in dual cognitive task while completing activity. Pt requires mod cuing for cognitive task & mod fade to min cuing to continue performing ball toss. Educated pt on decreased safety with movement when engaging in physical & cognitive dual tasks. Pt then performed trampoline ball toss without cognitive task, on airex with narrow BOS for further challenges to balance with CGA<>light min assist. Pt utilized nu-step on level 4 x 10 minutes with all four extremities with task focusing on global strengthening & endurance training. Back in room pt requires min assist to complete memory notebook entry. Pt left in recliner with chair alarm donned, call bell in lap & all needs in reach, telesitter in room.   Therapy Documentation Precautions:  Precautions Precautions: Fall Precaution Comments: life vest Restrictions Weight Bearing Restrictions:  No  Balance: Balance Balance Assessed: Yes Berg Balance Test Sit to Stand: Able to stand without using hands and stabilize independently Standing Unsupported: Able to stand safely 2 minutes Sitting with Back Unsupported but Feet Supported on Floor or Stool: Able to sit safely and securely 2 minutes Stand to Sit: Sits safely with minimal use of hands Transfers: Able to transfer safely, definite need of hands Standing Unsupported with Eyes Closed: Able to stand 10 seconds with supervision Standing Ubsupported with Feet Together: Able to place feet together independently and stand for 1 minute with supervision From Standing, Reach Forward with Outstretched Arm: Can reach forward >12 cm safely (5") From Standing Position, Pick up Object from Floor: Able to pick up shoe, needs supervision From Standing Position, Turn to Look Behind Over each Shoulder: Needs supervision when turning Turn 360 Degrees: Able to turn 360 degrees safely but slowly Standing Unsupported, Alternately Place Feet on Step/Stool: Able to complete 4 steps without aid or supervision (completes 8 taps with supervision) Standing Unsupported, One Foot in Front: Able to place foot ahead of the other independently and hold 30 seconds Standing on One Leg: Tries to lift leg/unable to hold 3 seconds but remains standing independently Total Score: 40    Therapy/Group: Individual Therapy  [name] 03/17/2020, 8:13 AM

## 2020-03-17 NOTE — Progress Notes (Signed)
[facility] PHYSICAL MEDICINE & REHABILITATION PROGRESS NOTE  Subjective/Complaints:  Pt reports no issues- slept OK, bowels "OK"- and denies pain.   In PT this AM- very blank affect   ROS:  Pt denies SOB, abd pain, CP, N/V/C/D, and vision changes   Objective: Vital Signs: Blood pressure 123/69, pulse 66, temperature 98 F (36.7 C), resp. rate 20, height 5\' 8"  (1.727 m), weight 86.2 kg, SpO2 97 %. No results found. No results for input(s): WBC, HGB, HCT, PLT in the last 72 hours. No results for input(s): NA, K, CL, CO2, GLUCOSE, BUN, CREATININE, CALCIUM in the last 72 hours.  Physical Exam: BP 123/69   Pulse 66   Temp 98 F (36.7 C)   Resp 20   Ht 5\' 8"  (1.727 m)   Wt 86.2 kg   SpO2 97%   BMI 28.89 kg/m   General: alert, but blank affect, due to anoxia, NAD HEENT: head shaved Neck: Supple without JVD or lymphadenopathy Heart: RRR Chest: CTA B/L- no W/R/R- good air movement Abdomen: Soft, NT, ND, (+)BS  Extremities: No clubbing, cyanosis, or edema. Pulses are 2+ Skin: Left wrist ecchymosis.  Intact. Psych: blank stares when asked questions, automatic replies Musc: No edema in extremities.  No tenderness in extremities. Neuro: Alert Motor: 5/5 grossly throughout, unchanged  Assessment/Plan: 1. Functional deficits secondary to anoxic brain injury which require 3+ hours per day of interdisciplinary therapy in a comprehensive inpatient rehab setting.  Physiatrist is providing close team supervision and 24 hour management of active medical problems listed below.  Physiatrist and rehab team continue to assess barriers to discharge/monitor patient progress toward functional and medical goals  Care Tool:  Bathing    Body parts bathed by patient: Right arm, Left arm, Abdomen, Chest, Front perineal area, Buttocks, Right upper leg, Left upper leg, Right lower leg, Left lower leg, Face     Body parts n/a: Chest   Bathing assist Assist Level: Supervision/Verbal cueing      Upper Body Dressing/Undressing Upper body dressing   What is the patient wearing?: Pull over shirt    Upper body assist Assist Level: Set up assist Assistive Device Comment: Donned life vest with setup assist  Lower Body Dressing/Undressing Lower body dressing      What is the patient wearing?: Underwear/pull up, Pants     Lower body assist Assist for lower body dressing: Supervision/Verbal cueing     Toileting Toileting    Toileting assist Assist for toileting: Supervision/Verbal cueing     Transfers Chair/bed transfer  Transfers assist     Chair/bed transfer assist level: Supervision/Verbal cueing Chair/bed transfer assistive device: Other (rollator)   Locomotion Ambulation   Ambulation assist      Assist level: Supervision/Verbal cueing Assistive device: No Device Max distance: >300'   Walk 10 feet activity   Assist     Assist level: Supervision/Verbal cueing Assistive device: No Device   Walk 50 feet activity   Assist    Assist level: Supervision/Verbal cueing Assistive device: No Device    Walk 150 feet activity   Assist    Assist level: Supervision/Verbal cueing Assistive device: Hand held assist    Walk 10 feet on uneven surface  activity   Assist     Assist level: Supervision/Verbal cueing Assistive device:  (none)   Wheelchair     Assist Will patient use wheelchair at discharge?: No (potentially for community mobility) Type of Wheelchair: Manual    Wheelchair assist level: Minimal Assistance - Patient >  75% Max wheelchair distance: 200 ft    Wheelchair 50 feet with 2 turns activity    Assist        Assist Level: Minimal Assistance - Patient > 75%   Wheelchair 150 feet activity     Assist     Assist Level: Minimal Assistance - Patient > 75%      Medical Problem List and Plan: 1.  Impaired function secondary to anoxic brain injury from prolonged CPR/STEMIsp cardiac arrest  Continue  CIR 2.  Antithrombotics: -DVT/anticoagulation:  Pharmaceutical: Lovenox--d/c as patient is ambulating >300 feet             -antiplatelet therapy: On ASA/Plavix.  3. Pain Management: N/a 4. Mood: LCSW to follow for evaluation and support. Positive mood.              -antipsychotic agents: N/a 5. Neuropsych: This patient is not capable of making decisions on his own behalf.   Continue telesitter for safety  7/31- still acts anoxic- poor memory 6. Skin/Wound Care: Monitor chest wall for healing.  7. Fluids/Electrolytes/Nutrition: Monitor I/Os. Added prosource for supplement and multivitamin. On soft diet  8. ACS with diffuse CAD: Treated medically with ASA/Plavix/Avapro/lopressor/crestor.  9. NSVT: Life vest to be placed prior to admission. On low dose lopressor bid.  7/31- well controlled- con't regimen 10. T2DM with hyperglycemia: Hgb A1c- 8.2.  PTA was on Tresiba 90 units, novolog 8 units tidac, Jardince 25 mg and metformin 1000 mg/500 mg.   Added carb modified restrictions to meals   Resumed Jardiance   Lantus 15 twice daily started on 7/26  NovoLog 8 3 times daily started on 7/25   CBG (last 3)  Recent Labs    03/16/20 1657 03/17/20 0617 03/17/20 1150  GLUCAP 208* 133* 145*    7/31- BGs somewhat labile- 133-208 in last 24 hours- will monitor and if labile tomorrow, make additional changes  Monitor the increased mobility 11. Abnormal LFTs:   LFTs elevated, slowly improving on 7/26 12. Indigestion/GERD- was on Prilosec 20 mg daily at home-  Ordered Ca Carbonate as well as Protonix 20 mg daily 13.  Labile blood pressure  7/31- 123/76- con't regimen 14.  AKI  Creatinine 0.98 on 7/26, improving  Encourage fluids  7/31- recheck Monday 15.  Leukocytosis: Resolved  WBCs 7.6 on 7/26  Afebrile  LOS: 9 days A FACE TO FACE EVALUATION WAS PERFORMED

## 2020-03-18 ENCOUNTER — Inpatient Hospital Stay (HOSPITAL_COMMUNITY): Payer: BC Managed Care – PPO

## 2020-03-18 LAB — GLUCOSE, CAPILLARY
Glucose-Capillary: 124 mg/dL — ABNORMAL HIGH (ref 70–99)
Glucose-Capillary: 251 mg/dL — ABNORMAL HIGH (ref 70–99)
Glucose-Capillary: 177 mg/dL — ABNORMAL HIGH (ref 70–99)
Glucose-Capillary: 366 mg/dL — ABNORMAL HIGH (ref 70–99)

## 2020-03-18 MED ORDER — INSULIN GLARGINE 100 UNIT/ML ~~LOC~~ SOLN
17.0000 [IU] | Freq: Two times a day (BID) | SUBCUTANEOUS | Status: DC
  Administered 2020-03-18 – 2020-03-19 (×2): 17 [IU] via SUBCUTANEOUS
  Filled 2020-03-18 (×3): qty 0.17

## 2020-03-18 NOTE — Progress Notes (Signed)
[place] PHYSICAL MEDICINE & REHABILITATION PROGRESS NOTE  Subjective/Complaints:  Pt reports everything "fine"- no issues.  Per staff, still very confused- using safety belt to keep reminders for keeping in bedside chair/bed.     ROS:  Unable to do secondary to impaired cognition   Objective: Vital Signs: Blood pressure 125/82, pulse 60, temperature 98.3 F (36.8 C), temperature source Oral, resp. rate 17, height 5\' 8"  (1.727 m), weight 86.2 kg, SpO2 99 %. No results found. No results for input(s): WBC, HGB, HCT, PLT in the last 72 hours. No results for input(s): NA, K, CL, CO2, GLUCOSE, BUN, CREATININE, CALCIUM in the last 72 hours.  Physical Exam: BP 125/82 (BP Location: Right Arm)   Pulse 60   Temp 98.3 F (36.8 C) (Oral)   Resp 17   Ht 5\' 8"  (1.727 m)   Wt 86.2 kg   SpO2 99%   BMI 28.89 kg/m   General: alert, awake, but flat/blank stare due to anoxia, NAD HEENT: head shaved Neck: Supple without JVD or lymphadenopathy Heart: RRR Chest: CTA B/L- no W/R/R- good air movement Abdomen: Soft, NT, ND, (+)BS  Extremities: No clubbing, cyanosis, or edema. Pulses are 2+ Skin: Left wrist ecchymosis.  Intact. Psych: blank stares when asked questions, automatic replies- no change Musc: No edema in extremities.  No tenderness in extremities. Neuro: Alert Motor: 5/5 grossly throughout, unchanged  Assessment/Plan: 1. Functional deficits secondary to anoxic brain injury which require 3+ hours per day of interdisciplinary therapy in a comprehensive inpatient rehab setting.  Physiatrist is providing close team supervision and 24 hour management of active medical problems listed below.  Physiatrist and rehab team continue to assess barriers to discharge/monitor patient progress toward functional and medical goals  Care Tool:  Bathing    Body parts bathed by patient: Right arm, Left arm, Abdomen, Chest, Front perineal area, Buttocks, Right upper leg, Left upper leg, Right  lower leg, Left lower leg, Face     Body parts n/a: Chest   Bathing assist Assist Level: Supervision/Verbal cueing     Upper Body Dressing/Undressing Upper body dressing   What is the patient wearing?: Pull over shirt    Upper body assist Assist Level: Set up assist Assistive Device Comment: Donned life vest with setup assist  Lower Body Dressing/Undressing Lower body dressing      What is the patient wearing?: Underwear/pull up, Pants     Lower body assist Assist for lower body dressing: Supervision/Verbal cueing     Toileting Toileting    Toileting assist Assist for toileting: Supervision/Verbal cueing     Transfers Chair/bed transfer  Transfers assist     Chair/bed transfer assist level: Supervision/Verbal cueing Chair/bed transfer assistive device: Other (rollator)   Locomotion Ambulation   Ambulation assist      Assist level: Supervision/Verbal cueing Assistive device: No Device Max distance: >300'   Walk 10 feet activity   Assist     Assist level: Supervision/Verbal cueing Assistive device: No Device   Walk 50 feet activity   Assist    Assist level: Supervision/Verbal cueing Assistive device: No Device    Walk 150 feet activity   Assist    Assist level: Supervision/Verbal cueing Assistive device: Hand held assist    Walk 10 feet on uneven surface  activity   Assist     Assist level: Supervision/Verbal cueing Assistive device:  (none)   Wheelchair     Assist Will patient use wheelchair at discharge?: No (potentially for community mobility) Type of  Wheelchair: Agricultural engineer assist level: Minimal Assistance - Patient > 75% Max wheelchair distance: 200 ft    Wheelchair 50 feet with 2 turns activity    Assist        Assist Level: Minimal Assistance - Patient > 75%   Wheelchair 150 feet activity     Assist     Assist Level: Minimal Assistance - Patient > 75%      Medical Problem List  and Plan: 1.  Impaired function secondary to anoxic brain injury from prolonged CPR/STEMIsp cardiac arrest  Continue CIR 2.  Antithrombotics: -DVT/anticoagulation:  Pharmaceutical: Lovenox--d/c as patient is ambulating >300 feet             -antiplatelet therapy: On ASA/Plavix.  3. Pain Management: N/a 4. Mood: LCSW to follow for evaluation and support. Positive mood.              -antipsychotic agents: N/a 5. Neuropsych: This patient is not capable of making decisions on his own behalf.   Continue telesitter for safety  8/1- still anoxic- needs telesitter for safety and bed alarm, etc.  6. Skin/Wound Care: Monitor chest wall for healing.  7. Fluids/Electrolytes/Nutrition: Monitor I/Os. Added prosource for supplement and multivitamin. On soft diet  8. ACS with diffuse CAD: Treated medically with ASA/Plavix/Avapro/lopressor/crestor.  9. NSVT: Life vest to be placed prior to admission. On low dose lopressor bid.  7/31- well controlled- con't regimen  8/1- needed reminders wearing life vest 10. T2DM with hyperglycemia: Hgb A1c- 8.2.  PTA was on Tresiba 90 units, novolog 8 units tidac, Jardince 25 mg and metformin 1000 mg/500 mg.   Added carb modified restrictions to meals   Resumed Jardiance   Lantus 15 twice daily started on 7/26  NovoLog 8 3 times daily started on 7/25   CBG (last 3)  Recent Labs    03/17/20 2109 03/18/20 0622 03/18/20 1148  GLUCAP 207* 124* 251*    7/31- BGs somewhat labile- 133-208 in last 24 hours- will monitor and if labile tomorrow, make additional changes  8/1- BGs 124-251- will increase Lantus to 17 units BID and monitor  Monitor the increased mobility 11. Abnormal LFTs:   LFTs elevated, slowly improving on 7/26 12. Indigestion/GERD- was on Prilosec 20 mg daily at home-  Ordered Ca Carbonate as well as Protonix 20 mg daily 13.  Labile blood pressure  7/31- 123/76- con't regimen 14.  AKI  Creatinine 0.98 on 7/26, improving  Encourage fluids  7/31-  recheck Monday 15.  Leukocytosis: Resolved  WBCs 7.6 on 7/26  Afebrile  LOS: 10 days A FACE TO FACE EVALUATION WAS PERFORMED

## 2020-03-18 NOTE — Progress Notes (Signed)
Physical Therapy Session Note  Patient Details  Name: Richard Hatfield MRN: [id number] Date of Birth: 01/03/1956  Today's Date: 03/18/2020 PT Individual Time: 1400-1445 PT Individual Time Calculation (min): 45 min   Short Term Goals: Week 1:  PT Short Term Goal 1 (Week 1): Pt will negotiate 1 step without rails with LRAD & mod assist. PT Short Term Goal 1 - Progress (Week 1): Met PT Short Term Goal 2 (Week 1): Pt will ambulate 75 ft with LRAD & supervision. PT Short Term Goal 2 - Progress (Week 1): Met PT Short Term Goal 3 (Week 1): Pt will demonstrate ability for day to day recall/carry over during functional activities with mod assist. PT Short Term Goal 3 - Progress (Week 1): Progressing toward goal Week 2:  PT Short Term Goal 1 (Week 2): STG = LTG due to estimated d/c date. Week 3:     Skilled Therapeutic Interventions/Progress Updates:    PAIN Denies pain  Pt initially oob in recliner watching movie.  Pt STS w/close supervision.  Gait >250ft w/close supervision including turning.    Dynamic balance including: Standing feet together, tandem R/L and L/R eyes open/closed up to 10 sec w/close supervision High knee marching x 10 each w/supervision. Sidestepping and retrogait 10ft x mult trials w/close supervision.  fucttional gait: Uneven surfaces 10ft mult passes w/close supervision Ascends and descends curb no UE support w/close supervision Ascends and descends ramp x 4 w/close supervision, no AD/rail   Flo0r transfer instructed w/and performed floor transfer w/supervision and use of mat/UE support w/transitiion/educated on use of nearby furniture of family bringing and stabilizing lightweight chair if needed in case of fall at home.  Functional Gait: Obstacle course including steppin up/onto/over curb, steppingover objects, weaving around tightly placed cones, standing on uneven surface eyes/ope/closed, picking up mult objects from floor sidestepping and backing up to 15ft in  each direction all w/supervision to close supervision.  Gait greater than 300ft including sudden stops/starts, changing pace from comfortable pace to hurried pace and back, sudden quick pivots to change direction all w/close supervision, no balance loss.  Pt able to pathfind from gym to room without cues from therapist, stops to think along the way but makes correct choices.  At end of session, Pt left oob in recliner w/chair alarm set and needs in reach.  Therapy Documentation Precautions:  Precautions Precautions: Fall Precaution Comments: life vest Restrictions Weight Bearing Restrictions: No    Therapy/Group: Individual Therapy   , PT    M  03/18/2020, 4:32 PM  

## 2020-03-19 ENCOUNTER — Inpatient Hospital Stay (HOSPITAL_COMMUNITY): Payer: BC Managed Care – PPO

## 2020-03-19 ENCOUNTER — Inpatient Hospital Stay (HOSPITAL_COMMUNITY): Payer: BC Managed Care – PPO | Admitting: Occupational Therapy

## 2020-03-19 ENCOUNTER — Inpatient Hospital Stay (HOSPITAL_COMMUNITY): Payer: BC Managed Care – PPO | Admitting: Speech Pathology

## 2020-03-19 LAB — BASIC METABOLIC PANEL
Anion gap: 11 (ref 5–15)
BUN: 14 mg/dL (ref 8–23)
CO2: 22 mmol/L (ref 22–32)
Calcium: 9 mg/dL (ref 8.9–10.3)
Chloride: 106 mmol/L (ref 98–111)
Creatinine, Ser: 0.78 mg/dL (ref 0.61–1.24)
GFR calc Af Amer: >60 mL/min (ref >60)
GFR calc non Af Amer: >60 mL/min (ref >60)
Glucose, Bld: 135 mg/dL — ABNORMAL HIGH (ref 70–99)
Potassium: 4.1 mmol/L (ref 3.5–5.1)
Sodium: 139 mmol/L (ref 135–145)

## 2020-03-19 LAB — GLUCOSE, CAPILLARY
Glucose-Capillary: 135 mg/dL — ABNORMAL HIGH (ref 70–99)
Glucose-Capillary: 181 mg/dL — ABNORMAL HIGH (ref 70–99)
Glucose-Capillary: 167 mg/dL — ABNORMAL HIGH (ref 70–99)
Glucose-Capillary: 183 mg/dL — ABNORMAL HIGH (ref 70–99)
Glucose-Capillary: 170 mg/dL — ABNORMAL HIGH (ref 70–99)

## 2020-03-19 LAB — CBC
HCT: 38.5 % — ABNORMAL LOW (ref 39.0–52.0)
Hemoglobin: 13.2 g/dL (ref 13.0–17.0)
MCH: 32.2 pg (ref 26.0–34.0)
MCHC: 34.3 g/dL (ref 30.0–36.0)
MCV: 93.9 fL (ref 80.0–100.0)
Platelets: 264 10*3/uL (ref 150–400)
RBC: 4.1 MIL/uL — ABNORMAL LOW (ref 4.22–5.81)
RDW: 11.8 % (ref 11.5–15.5)
WBC: 5.2 10*3/uL (ref 4.0–10.5)
nRBC: 0 % (ref 0.0–0.2)

## 2020-03-19 MED ORDER — INSULIN GLARGINE 100 UNIT/ML ~~LOC~~ SOLN
18.0000 [IU] | Freq: Two times a day (BID) | SUBCUTANEOUS | Status: DC
  Administered 2020-03-19 – 2020-03-20 (×2): 18 [IU] via SUBCUTANEOUS
  Filled 2020-03-19 (×3): qty 0.18

## 2020-03-19 NOTE — Progress Notes (Signed)
[place] PHYSICAL MEDICINE & REHABILITATION PROGRESS NOTE  Subjective/Complaints: Labs stable today.  Ambulated 7 feet yesterday S with PT  Confused, very pleasant  ROS:  Unable to do secondary to impaired cognition  Objective: Vital Signs: Blood pressure 122/78, pulse 51, temperature 98.8 F (37.1 C), temperature source Oral, resp. rate 16, height 5\' 8"  (1.727 m), weight 86.2 kg, SpO2 99 %. No results found. Recent Labs    03/19/20 0539  WBC 5.2  HGB 13.2  HCT 38.5*  PLT 264   Recent Labs    03/19/20 0539  NA 139  K 4.1  CL 106  CO2 22  GLUCOSE 135*  BUN 14  CREATININE 0.78  CALCIUM 9.0    Physical Exam: BP 122/78 (BP Location: Right Arm)   Pulse 51   Temp 98.8 F (37.1 C) (Oral)   Resp 16   Ht 5\' 8"  (1.727 m)   Wt 86.2 kg   SpO2 99%   BMI 28.89 kg/m   General: Alert, No apparent distress HEENT: Head is normocephalic, atraumatic, PERRLA, EOMI, sclera anicteric, oral mucosa pink and moist, dentition intact, ext ear canals clear,  Neck: Supple without JVD or lymphadenopathy Heart: Reg rate and rhythm. No murmurs rubs or gallops Chest: CTA bilaterally without wheezes, rales, or rhonchi; no distress Abdomen: Soft, non-tender, non-distended, bowel sounds positive. Extremities: No clubbing, cyanosis, or edema. Pulses are 2+ Skin: Clean and intact without signs of breakdown Neuro: Pt is cognitively appropriate with normal insight, memory, and awareness. Cranial nerves 2-12 are intact. Sensory exam is normal. Reflexes are 2+ in all 4's. Fine motor coordination is intact. No tremors. Motor function is grossly 5/5.  Psych: Pt's affect is flat  Assessment/Plan: 1. Functional deficits secondary to anoxic brain injury which require 3+ hours per day of interdisciplinary therapy in a comprehensive inpatient rehab setting.  Physiatrist is providing close team supervision and 24 hour management of active medical problems listed below.  Physiatrist and rehab team  continue to assess barriers to discharge/monitor patient progress toward functional and medical goals  Care Tool:  Bathing    Body parts bathed by patient: Right arm, Left arm, Abdomen, Chest, Front perineal area, Buttocks, Right upper leg, Left upper leg, Right lower leg, Left lower leg, Face     Body parts n/a: Chest   Bathing assist Assist Level: Supervision/Verbal cueing     Upper Body Dressing/Undressing Upper body dressing   What is the patient wearing?: Pull over shirt    Upper body assist Assist Level: Supervision/Verbal cueing Assistive Device Comment: Donned life vest with setup assist  Lower Body Dressing/Undressing Lower body dressing      What is the patient wearing?: Underwear/pull up, Pants     Lower body assist Assist for lower body dressing: Supervision/Verbal cueing     Toileting Toileting    Toileting assist Assist for toileting: Supervision/Verbal cueing     Transfers Chair/bed transfer  Transfers assist     Chair/bed transfer assist level: Supervision/Verbal cueing Chair/bed transfer assistive device: Other (rollator)   Locomotion Ambulation   Ambulation assist      Assist level: Supervision/Verbal cueing Assistive device: No Device Max distance: >300'   Walk 10 feet activity   Assist     Assist level: Supervision/Verbal cueing Assistive device: No Device   Walk 50 feet activity   Assist    Assist level: Supervision/Verbal cueing Assistive device: No Device    Walk 150 feet activity   Assist    Assist level: Supervision/Verbal  cueing Assistive device: No Device    Walk 10 feet on uneven surface  activity   Assist     Assist level: Supervision/Verbal cueing Assistive device:  (none)   Wheelchair     Assist Will patient use wheelchair at discharge?: No Type of Wheelchair: Manual    Wheelchair assist level: Minimal Assistance - Patient > 75% Max wheelchair distance: 200 ft    Wheelchair 50  feet with 2 turns activity    Assist        Assist Level: Minimal Assistance - Patient > 75%   Wheelchair 150 feet activity     Assist     Assist Level: Minimal Assistance - Patient > 75%      Medical Problem List and Plan: 1.  Impaired function secondary to anoxic brain injury from prolonged CPR/STEMIsp cardiac arrest  Contiunue CIR 2.  Antithrombotics: -DVT/anticoagulation:  Pharmaceutical: Lovenox discontinued as patient is ambulating >300 feet             -antiplatelet therapy: On ASA/Plavix.  3. Pain Management: N/a 4. Mood: LCSW to follow for evaluation and support. Positive mood.              -antipsychotic agents: N/a 5. Neuropsych: This patient is not capable of making decisions on his own behalf.   Continue telesitter for safety 6. Skin/Wound Care: Monitor chest wall for healing.  7. Fluids/Electrolytes/Nutrition: Monitor I/Os. Added prosource for supplement and multivitamin. On soft diet  8. ACS with diffuse CAD: Treated medically with ASA/Plavix/Avapro/lopressor/crestor.  9. NSVT: Life vest to be placed prior to admission. On low dose lopressor bid.  8/2: very well controlled.  10. T2DM with hyperglycemia: Hgb A1c- 8.2.  PTA was on Tresiba 90 units, novolog 8 units tidac, Jardince 25 mg and metformin 1000 mg/500 mg,   Started carb modified restrictions to meals, Lantus and  CBG (last 3)  Recent Labs    03/18/20 1643 03/18/20 2051 03/19/20 0549  GLUCAP 177* 366* 135*    8/2: increase Lantus to 18U.  11. Abnormal LFTs:   LFTs elevated, slowly improving on 7/26 12. Indigestion/GERD- was on Prilosec 20 mg daily at home-  Ordered Ca Carbonate as well as Protonix 20 mg daily 13.  Labile blood pressure  7/31- 123/76- con't regimen 14.  AKI  Creatinine 0.98 on 7/26, improving  Encourage fluids  8/2 stable 15.  Leukocytosis: Resolved  8/2 stable  Afebrile  LOS: 11 days A FACE TO FACE EVALUATION WAS PERFORMED

## 2020-03-19 NOTE — Progress Notes (Signed)
Patient ID: Richard Hatfield, male   DOB: 08/14/1956, 64 y.o.   MRN: [id number]   Sw faxed [facility] PT/OT/SLP order ([phone]).  Loralee Pacas, MSW, [place] Office: [phone] Cell: [phone] Fax: [phone]

## 2020-03-19 NOTE — Plan of Care (Signed)
  Problem: RH Balance Goal: LTG: Patient will maintain dynamic sitting balance (OT) Description: LTG:  Patient will maintain dynamic sitting balance with assistance during activities of daily living (OT) Outcome: Completed/Met   Problem: Sit to Stand Goal: LTG:  Patient will perform sit to stand in prep for activites of daily living with assistance level (OT) Description: LTG:  Patient will perform sit to stand in prep for activites of daily living with assistance level (OT) Outcome: Completed/Met   Problem: RH Eating Goal: LTG Patient will perform eating w/assist, cues/equip (OT) Description: LTG: Patient will perform eating with assist, with/without cues using equipment (OT) Outcome: Completed/Met   Problem: RH Grooming Goal: LTG Patient will perform grooming w/assist,cues/equip (OT) Description: LTG: Patient will perform grooming with assist, with/without cues using equipment (OT) Outcome: Completed/Met   Problem: RH Bathing Goal: LTG Patient will bathe all body parts with assist levels (OT) Description: LTG: Patient will bathe all body parts with assist levels (OT) Outcome: Completed/Met   Problem: RH Dressing Goal: LTG Patient will perform upper body dressing (OT) Description: LTG Patient will perform upper body dressing with assist, with/without cues (OT). Outcome: Completed/Met Goal: LTG Patient will perform lower body dressing w/assist (OT) Description: LTG: Patient will perform lower body dressing with assist, with/without cues in positioning using equipment (OT) Outcome: Completed/Met   Problem: RH Toileting Goal: LTG Patient will perform toileting task (3/3 steps) with assistance level (OT) Description: LTG: Patient will perform toileting task (3/3 steps) with assistance level (OT)  Outcome: Completed/Met   Problem: RH Toilet Transfers Goal: LTG Patient will perform toilet transfers w/assist (OT) Description: LTG: Patient will perform toilet transfers with assist,  with/without cues using equipment (OT) Outcome: Completed/Met   Problem: RH Tub/Shower Transfers Goal: LTG Patient will perform tub/shower transfers w/assist (OT) Description: LTG: Patient will perform tub/shower transfers with assist, with/without cues using equipment (OT) Outcome: Completed/Met   Problem: RH Awareness Goal: LTG: Patient will demonstrate awareness during functional activites type of (OT) Description: LTG: Patient will demonstrate awareness during functional activites type of (OT) Outcome: Completed/Met

## 2020-03-19 NOTE — Progress Notes (Signed)
Speech Language Pathology Discharge Summary  Patient Details  Name: Richard Hatfield MRN: [id number] Date of Birth: 10-23-1955  Today's Date: 03/19/2020 SLP Individual Time: 1000-1045 SLP Individual Time Calculation (min): 45 min   Skilled Therapeutic Interventions: Patient seen to address cognitive-linguistic goals in anticipation of discharge home with 24 hour supervision and outpatient ST recommended. Patient completed ALFA subtests: Subtest 4 Solving Math Problems: 100% accuracy; Subtest 5 Writing and Balancing a Checkbook: 100% accuracy. He was oriented to time, place, situation without assistance but continues to state that he has gone home twice during this hospitalization then come back. When questioned, he does not demonstrate realization or understanding that patients are not able to go home and then return to inpatient rehab. When walking back to room and talking to SLP, he walked past his room despite knowing it was "room 11" and required verbal cue to become aware of this.    Patient has met 5 of 5 long term goals.  Patient to discharge at overall Supervision level.  Reasons goals not met: N/A   Clinical Impression/Discharge Summary:   Patient met 5/5 long term goals and is being discharged at supervision level with recommendation of 24 hour supervision. He has demonstrated significant improvements in attention, awareness, functional problem solving, and although he is oriented x4, he continues to require supervision for safety due to continued deficits in short term memory/recall of new information, as well as deficit in awareness (currently he is at emergent awareness level but not anticipatory). He is able to complete complex level problem solving (medication and money management, etc) but does not awareness or self-monitoring for when he makes contradictory or unrealistic statements. He will benefit from 24 hour supervision and outpatient ST therapy to continue working on  cognitive-linguistic goals.  Care Partner:  Caregiver Able to Provide Assistance: Yes  Type of Caregiver Assistance: Cognitive  Recommendation:  Outpatient SLP;24 hour supervision/assistance  Rationale for SLP Follow Up: Reduce caregiver burden;Maximize cognitive function and independence   Equipment: N/A   Reasons for discharge: Treatment goals met;Discharged from hospital   Patient/Family Agrees with Progress Made and Goals Achieved: Yes    Sonia Baller, MA, CCC-SLP Speech Therapy

## 2020-03-19 NOTE — Progress Notes (Signed)
Occupational Therapy Discharge Summary  Patient Details  Name: Richard Hatfield MRN: [id number] Date of Birth: [birth year]  Today's Date: 03/19/2020 OT Individual Time: 0630-1601 OT Individual Time Calculation (min): 58 min    OT treatment session focused on increased independence with BADL tasks. Pt able to access dresser drawers, collect clothing, ambulate to in room without AD with supervision. Bathing/dressing completed mod I/supervision with increased time and min verbal cues to remember life vest battery. Pt then ambulated to therapy gym and completed UB there-ex with 5 mins x 2 on UE ergometer. Level set on 7. Pt ambulated back to room with supervision and left seated in recliner with alarm belt on, call bell in reach, and needs met. See functional navigator for further details on BADL performance.   Patient has met 11 of 11 long term goals due to improved activity tolerance, improved balance, postural control, ability to compensate for deficits, improved attention, improved awareness and improved coordination.  Patient to discharge at overall Supervision level.  Patient's care partner is independent to provide the necessary cognitive assistance at discharge.    Reasons goals not met: n/a  Recommendation:  Patient will benefit from ongoing skilled OT services in outpatient setting to continue to advance functional skills in the area of BADL.  Equipment: No equipment provided  Reasons for discharge: treatment goals met and discharge from hospital  Patient/family agrees with progress made and goals achieved: Yes  OT Discharge Precautions/Restrictions  Precautions Precautions: Fall Precaution Comments: life vest Restrictions Weight Bearing Restrictions: No Pain  denies pain ADL ADL Eating: Independent Grooming: Independent Upper Body Bathing: Supervision/safety Lower Body Bathing: Supervision/safety Upper Body Dressing: Supervision/safety Lower Body Dressing:  Supervision/safety Toileting: Supervision/safety Toilet Transfer: Distant supervision Cognition Arousal/Alertness: Awake/alert Orientation Level: Oriented X4 Awareness: Impaired (improved since eval) Awareness Impairment: Intellectual impairment Sensation Sensation Light Touch: Appears Intact Proprioception: Appears Intact Coordination Gross Motor Movements are Fluid and Coordinated: Yes Fine Motor Movements are Fluid and Coordinated: Yes Coordination and Movement Description: improved smoothness and accuracy with all movement Motor  Motor Motor - Discharge Observations: overall strength much improved, still mild generalized weakness Mobility  Bed Mobility Rolling Right: Independent Rolling Left: Independent Supine to Sit: Independent Sit to Supine: Independent Transfers Sit to Stand: Supervision/Verbal cueing Stand to Sit: Supervision/Verbal cueing  Balance Static Sitting Balance Static Sitting - Level of Assistance: 7: Independent Dynamic Sitting Balance Dynamic Sitting - Level of Assistance: 7: Independent Static Standing Balance Static Standing - Balance Support: During functional activity Static Standing - Level of Assistance: 5: Stand by assistance Dynamic Standing Balance Dynamic Standing - Balance Support: During functional activity Dynamic Standing - Level of Assistance: 5: Stand by assistance Extremity/Trunk Assessment RUE Assessment RUE Assessment: Within Functional Limits LUE Assessment LUE Assessment: Within Functional Limits   [name] [name] 03/19/2020, 12:36 PM

## 2020-03-19 NOTE — Progress Notes (Signed)
Occupational Therapy Session Note  Patient Details  Name: Richard Hatfield MRN: [id number] Date of Birth: 02-Jul-1956  Today's Date: 03/19/2020 OT Individual Time: 1130-1210 OT Individual Time Calculation (min): 40 min    Short Term Goals: Week 2:  OT Short Term Goal 1 (Week 2): LTG=STG 2/2 ELOS  Skilled Therapeutic Interventions/Progress Updates:   Pt received sitting in the recliner with wife Richard Hatfield present. Richard Hatfield requesting assistance with changing out lifevest. Pt required max cueing for recall of removing battery before doffing lifevest. Richard Hatfield required no cueing to remember. Pt doffed vest and then Richard Hatfield was supervised in changing out pads. She demonstrated competency at doing this, and precautions were reviewed. Pt required min A to don and to remember to put battery pack around his neck. Pt completed toileting in standing, ambulating around room with mod I. Pt completed 100 ft of functional mobility with mod I to the therapy gym. Remainder of session focused on increasing functional activity tolerance and BUE strengthening. Pt held 4lb dowel and completed functional reaching overhead and forward 3x 8 repetitions with rest break provided. Pt then completed marching activity to challenge dynamic standing balance, with (S) provided during isolated LE stance. Pt returned to his room and was left sitting up in the recliner with all needs met, wife present. Chair alarm set.   Therapy Documentation Precautions:  Precautions Precautions: Fall Precaution Comments: life vest Restrictions Weight Bearing Restrictions: No   Therapy/Group: Individual Therapy  Curtis Sites 03/19/2020, 6:42 AM

## 2020-03-19 NOTE — Progress Notes (Signed)
Physical Therapy Discharge Summary  Patient Details  Name: Richard Hatfield MRN: [id number] Date of Birth: 1955-10-10  Today's Date: 03/19/2020 PT Individual Time: 3267-1245 PT Individual Time Calculation (min): 57 min    Patient has met 9 of 9 long term goals due to improved activity tolerance, improved balance, improved postural control, increased strength, improved attention and improved coordination.  Patient to discharge at an ambulatory level Supervision.   Patient's care partner is independent to provide the necessary physical and cognitive assistance at discharge.  Reasons goals not met: NA  Recommendation:  Patient will benefit from ongoing skilled PT services in outpatient setting to continue to advance safe functional mobility, address ongoing impairments in strength, balance, ambulation, and minimize fall risk.  Equipment: No equipment provided  Reasons for discharge: treatment goals met and discharge from hospital  Patient/family agrees with progress made and goals achieved: Yes   Skilled Therapeutic Interventions: Pt received seated in recliner. No pain. Stand step to bed and performs sit<>supine with verbal cues on sequencing and positioning. Pt ambulates +300' without AD and with verbal cues to maintain upright gaze to improve posture and balance, and increasing stride length to decrease risk for falls. Pt performs car transfer and stair navigation with verbal cues for safe technique. Pt completes 12 6" steps with RHR and verbal cues for sequencing and safety. Pt completes Berg Balance Assessment with score of 48/56. Most difficult task for pt is single leg stance. Pt performs forward and backward ambulation for dynamic balance training, x20' in each direction. No LOBs. Pt performs x10 minutes on Nustep for functional strengthening and reciprocal coordination training. Pt ambulates 100' back to room with supervision. Left seated in recliner with alarm intact and all needs within  reach.   PT Discharge Precautions/Restrictions Precautions Precautions: Fall Precaution Comments: life vest Restrictions Weight Bearing Restrictions: No Pain Pain Assessment Pain Scale: 0-10 Pain Score: 0-No pain Vision/Perception  Perception Perception: Within Functional Limits Praxis Praxis: Intact  Cognition Overall Cognitive Status: Impaired/Different from baseline Arousal/Alertness: Awake/alert Orientation Level: Oriented X4 Attention: Selective Focused Attention: Appears intact Sustained Attention: Appears intact Selective Attention: Appears intact Alternating Attention: Impaired Alternating Attention Impairment: Verbal complex;Functional complex Memory: Impaired Memory Impairment: Decreased recall of new information;Decreased short term memory;Retrieval deficit Awareness: Impaired Awareness Impairment: Anticipatory impairment Problem Solving: Appears intact Problem Solving Impairment: Functional complex;Functional basic Executive Function: Reasoning;Organizing;Sequencing Reasoning: Impaired Reasoning Impairment: Verbal complex;Functional complex;Functional basic Sequencing: Appears intact Organizing: Appears intact Behaviors: Restless Safety/Judgment: Impaired Sensation Sensation Light Touch: Appears Intact Proprioception: Appears Intact Coordination Gross Motor Movements are Fluid and Coordinated: Yes Fine Motor Movements are Fluid and Coordinated: Yes Coordination and Movement Description: improved smoothness and accuracy with all movement Motor  Motor Motor: Within Functional Limits Motor - Discharge Observations: overall strength much improved, still mild generalized weakness  Mobility Bed Mobility Bed Mobility: Supine to Sit;Sit to Supine Supine to Sit: Independent Sit to Supine: Independent Transfers Transfers: Sit to Stand;Stand to Sit;Stand Pivot Transfers Sit to Stand: Supervision/Verbal cueing Stand to Sit: Supervision/Verbal  cueing Stand Pivot Transfers: Supervision/Verbal cueing Stand Pivot Transfer Details: Verbal cues for technique;Verbal cues for sequencing Transfer (Assistive device): None Locomotion  Gait Ambulation: Yes Gait Assistance: Supervision/Verbal cueing Gait Distance (Feet): 300 Feet Assistive device: None Gait Assistance Details: Verbal cues for technique;Verbal cues for precautions/safety Gait Gait: Yes Gait Pattern: Impaired Gait Pattern: Decreased step length - right;Decreased step length - left;Decreased stride length;Decreased trunk rotation Gait velocity: decreased Stairs / Additional Locomotion Stairs: Yes Stairs Assistance: Supervision/Verbal cueing Stair  Management Technique: One rail Right Number of Stairs: 12 Height of Stairs: 6 Ramp: Supervision/Verbal cueing Curb: Supervision/Verbal cueing Wheelchair Mobility Wheelchair Mobility: No  Trunk/Postural Assessment  Cervical Assessment Cervical Assessment: Exceptions to [facility] (forward head) Thoracic Assessment Thoracic Assessment: Exceptions to [facility] (rounded shoulders) Lumbar Assessment Lumbar Assessment: Exceptions to [facility] (posterior pelvic tilt) Postural Control Postural Control: Deficits on evaluation Righting Reactions: delayed Protective Responses: delayed  Balance Balance Balance Assessed: Yes Standardized Balance Assessment Standardized Balance Assessment: Berg Balance Test Berg Balance Test Sit to Stand: Able to stand without using hands and stabilize independently Standing Unsupported: Able to stand safely 2 minutes Sitting with Back Unsupported but Feet Supported on Floor or Stool: Able to sit safely and securely 2 minutes Stand to Sit: Sits safely with minimal use of hands Transfers: Able to transfer safely, minor use of hands Standing Unsupported with Eyes Closed: Able to stand 10 seconds safely Standing Ubsupported with Feet Together: Able to place feet together independently and stand 1 minute  safely From Standing, Reach Forward with Outstretched Arm: Can reach forward >12 cm safely (5") From Standing Position, Pick up Object from Floor: Able to pick up shoe safely and easily From Standing Position, Turn to Look Behind Over each Shoulder: Turn sideways only but maintains balance Turn 360 Degrees: Able to turn 360 degrees safely but slowly Standing Unsupported, Alternately Place Feet on Step/Stool: Able to stand independently and safely and complete 8 steps in 20 seconds Standing Unsupported, One Foot in Front: Able to place foot tandem independently and hold 30 seconds Standing on One Leg: Tries to lift leg/unable to hold 3 seconds but remains standing independently Total Score: 48 Static Sitting Balance Static Sitting - Balance Support: Feet supported Static Sitting - Level of Assistance: 7: Independent Dynamic Sitting Balance Dynamic Sitting - Balance Support: During functional activity Dynamic Sitting - Level of Assistance: 7: Independent Static Standing Balance Static Standing - Balance Support: During functional activity Static Standing - Level of Assistance: 5: Stand by assistance Dynamic Standing Balance Dynamic Standing - Balance Support: During functional activity Dynamic Standing - Level of Assistance: 5: Stand by assistance Extremity Assessment  RLE Assessment General Strength Comments: Grossly 4+/5 LLE Assessment General Strength Comments: Grossly 4+/5    Breck Coons, PT, DPT 03/19/2020, 3:42 PM

## 2020-03-20 LAB — GLUCOSE, CAPILLARY
Glucose-Capillary: 196 mg/dL — ABNORMAL HIGH (ref 70–99)
Glucose-Capillary: 128 mg/dL — ABNORMAL HIGH (ref 70–99)
Glucose-Capillary: 128 mg/dL — ABNORMAL HIGH (ref 70–99)
Glucose-Capillary: 228 mg/dL — ABNORMAL HIGH (ref 70–99)

## 2020-03-20 MED ORDER — ROSUVASTATIN CALCIUM 40 MG PO TABS: 40.0000 mg | ORAL_TABLET | Freq: Every day | ORAL | 0 refills | Status: AC

## 2020-03-20 MED ORDER — ACETAMINOPHEN 325 MG PO TABS: 325.0000 mg | ORAL_TABLET | ORAL | Status: AC | PRN

## 2020-03-20 MED ORDER — CLOPIDOGREL BISULFATE 75 MG PO TABS: 75.0000 mg | ORAL_TABLET | Freq: Every day | ORAL | 0 refills | Status: DC

## 2020-03-20 MED ORDER — IRBESARTAN 75 MG PO TABS: 37.5000 mg | ORAL_TABLET | Freq: Every day | ORAL | 0 refills | Status: DC

## 2020-03-20 MED ORDER — METOPROLOL TARTRATE 25 MG PO TABS: 25.0000 mg | ORAL_TABLET | Freq: Two times a day (BID) | ORAL | 0 refills | Status: DC

## 2020-03-20 MED ORDER — ADULT MULTIVITAMIN W/MINERALS CH: 1.0000 | ORAL_TABLET | Freq: Every day | ORAL | Status: AC

## 2020-03-20 MED ORDER — POLYETHYLENE GLYCOL 3350 17 G PO PACK: 17.0000 g | PACK | Freq: Two times a day (BID) | ORAL | 0 refills | Status: AC

## 2020-03-20 MED ORDER — PANTOPRAZOLE SODIUM 20 MG PO TBEC: 20.0000 mg | DELAYED_RELEASE_TABLET | Freq: Every day | ORAL | 0 refills | Status: DC

## 2020-03-20 MED ORDER — CALCIUM CARBONATE ANTACID 500 MG PO CHEW: 1.0000 | CHEWABLE_TABLET | Freq: Two times a day (BID) | ORAL | 0 refills | Status: AC

## 2020-03-20 MED ORDER — EMPAGLIFLOZIN 25 MG PO TABS: 25.0000 mg | ORAL_TABLET | Freq: Every day | ORAL | 0 refills | Status: AC

## 2020-03-20 MED ORDER — TRAZODONE HCL 50 MG PO TABS: 25.0000 mg | ORAL_TABLET | Freq: Every evening | ORAL | 0 refills | Status: DC | PRN

## 2020-03-20 MED ORDER — INSULIN DEGLUDEC 100 UNIT/ML ~~LOC~~ SOPN
18.0000 [IU] | PEN_INJECTOR | Freq: Two times a day (BID) | SUBCUTANEOUS | Status: DC
Start: 2020-03-20 — End: 2022-07-25

## 2020-03-20 MED ORDER — AMLODIPINE BESYLATE 5 MG PO TABS: 5.0000 mg | ORAL_TABLET | Freq: Every day | ORAL | 0 refills | Status: DC

## 2020-03-20 NOTE — Progress Notes (Signed)
Restful throughout shift, no acute distress or discomfort coloration adequate, Life Vest in place with monitoring system. Anticipates discharge home today

## 2020-03-20 NOTE — Progress Notes (Signed)
Patient is scheduled for discharge today. Belongings are packed. D/C instructions given to [place]. Patent denies any pains currently. Amanda Cockayne, LPN

## 2020-03-20 NOTE — Progress Notes (Signed)
[place] PHYSICAL MEDICINE & REHABILITATION PROGRESS NOTE  Subjective/Complaints: No complaints. Aware that he's going home today. Wife coming to pick him up.   ROS: Limited due to cognitive/behavioral    Objective: Vital Signs: Blood pressure 114/69, pulse (!) 59, temperature 97.6 F (36.4 C), temperature source Oral, resp. rate 15, height 5\' 8"  (1.727 m), weight 86.2 kg, SpO2 99 %. No results found. Recent Labs    03/19/20 0539  WBC 5.2  HGB 13.2  HCT 38.5*  PLT 264   Recent Labs    03/19/20 0539  NA 139  K 4.1  CL 106  CO2 22  GLUCOSE 135*  BUN 14  CREATININE 0.78  CALCIUM 9.0    Physical Exam: BP 114/69   Pulse (!) 59   Temp 97.6 F (36.4 C) (Oral)   Resp 15   Ht 5\' 8"  (1.727 m)   Wt 86.2 kg   SpO2 99%   BMI 28.89 kg/m   Constitutional: No distress . Vital signs reviewed. HEENT: EOMI, oral membranes moist Neck: supple Cardiovascular: RRR without murmur. No JVD    Respiratory/Chest: CTA Bilaterally without wheezes or rales. Normal effort    GI/Abdomen: BS +, non-tender, non-distended Ext: no clubbing, cyanosis, or edema Psych: pleasant and cooperative Skin: intact Neuro: alert. Recalls basic info with some delays, has basic awareness and insight.  Cranial nerves 2-12 are intact. Sensory exam is normal. Reflexes are 2+ in all 4's. Fine motor coordination is intact. No tremors. Motor function is grossly 5/5.     Assessment/Plan: 1. Functional deficits secondary to anoxic brain injury which require 3+ hours per day of interdisciplinary therapy in a comprehensive inpatient rehab setting.  Physiatrist is providing close team supervision and 24 hour management of active medical problems listed below.  Physiatrist and rehab team continue to assess barriers to discharge/monitor patient progress toward functional and medical goals  Care Tool:  Bathing    Body parts bathed by patient: Right arm, Left arm, Abdomen, Chest, Front perineal area, Buttocks,  Right upper leg, Left upper leg, Right lower leg, Left lower leg, Face     Body parts n/a: Chest   Bathing assist Assist Level: Supervision/Verbal cueing     Upper Body Dressing/Undressing Upper body dressing   What is the patient wearing?: Pull over shirt    Upper body assist Assist Level: Supervision/Verbal cueing Assistive Device Comment: Donned life vest with setup assist  Lower Body Dressing/Undressing Lower body dressing      What is the patient wearing?: Underwear/pull up, Pants     Lower body assist Assist for lower body dressing: Supervision/Verbal cueing     Toileting Toileting    Toileting assist Assist for toileting: Supervision/Verbal cueing     Transfers Chair/bed transfer  Transfers assist     Chair/bed transfer assist level: Supervision/Verbal cueing Chair/bed transfer assistive device: Other (rollator)   Locomotion Ambulation   Ambulation assist      Assist level: Supervision/Verbal cueing Assistive device: No Device Max distance: >300'   Walk 10 feet activity   Assist     Assist level: Supervision/Verbal cueing Assistive device: No Device   Walk 50 feet activity   Assist    Assist level: Supervision/Verbal cueing Assistive device: No Device    Walk 150 feet activity   Assist    Assist level: Supervision/Verbal cueing Assistive device: No Device    Walk 10 feet on uneven surface  activity   Assist     Assist level: Supervision/Verbal cueing Assistive  device: Other (comment) (none)   Wheelchair     Assist Will patient use wheelchair at discharge?: No Type of Wheelchair: Manual    Wheelchair assist level: Minimal Assistance - Patient > 75% Max wheelchair distance: 200 ft    Wheelchair 50 feet with 2 turns activity    Assist        Assist Level: Minimal Assistance - Patient > 75%   Wheelchair 150 feet activity     Assist     Assist Level: Minimal Assistance - Patient > 75%       Medical Problem List and Plan: 1.  Impaired function secondary to anoxic brain injury from prolonged CPR/STEMIsp cardiac arrest  Dc home today  -Patient to see Dr. Ranell Patrick in the office for transitional care encounter in 1-2 weeks.  2.  Antithrombotics:-DVT/anticoagulation:  Pharmaceutical: Lovenox discontinued as patient is ambulating >300 feet             -antiplatelet therapy: On ASA/Plavix.  3. Pain Management: N/a 4. Mood: LCSW to follow for evaluation and support. Positive mood.              -antipsychotic agents: N/a 5. Neuropsych: This patient is not capable of making decisions on his own behalf.   Continue telesitter for safety 6. Skin/Wound Care: Monitor chest wall for healing.  7. Fluids/Electrolytes/Nutrition: Monitor I/Os. Added prosource for supplement and multivitamin. On soft diet  8. ACS with diffuse CAD: Treated medically with ASA/Plavix/Avapro/lopressor/crestor.  9. NSVT: Life vest in place. On low dose lopressor bid.  8/3: very well controlled.  10. T2DM with hyperglycemia: Hgb A1c- 8.2.  PTA was on Tresiba 90 units, novolog 8 units tidac, Jardince 25 mg and metformin 1000 mg/500 mg,   Started carb modified restrictions to meals, Lantus and  CBG (last 3)  Recent Labs    03/19/20 2037 03/19/20 2159 03/20/20 0546  GLUCAP 183* 170* 128*    8/2: increased Lantus to 18U.   8/3 further mgt as outpt 11. Abnormal LFTs:   LFTs elevated, slowly improving on 7/26 12. Indigestion/GERD- was on Prilosec 20 mg daily at home-  Ordered Ca Carbonate as well as Protonix 20 mg daily 13.  Labile blood pressure  7/31- 123/76- con't regimen 14.  AKI  Creatinine 0.98 on 7/26, improving  Encourage fluids  8/3 stable 15.  Leukocytosis: Resolved  8/2 stable  Afebrile  LOS: 12 days A FACE TO FACE EVALUATION WAS PERFORMED

## 2020-03-20 NOTE — Discharge Instructions (Signed)
Inpatient Rehab Discharge Instructions  Richard Hatfield Discharge date and time: 03/20/20   Activities/Precautions/ Functional Status: Activity: no lifting, driving, or strenuous exercise  till cleared by MD Diet: cardiac diet and diabetic diet Wound Care: keep wound clean and dry   Functional status:  ___ No restrictions     ___ Walk up steps independently _X__ 24/7 supervision/assistance   ___ Walk up steps with assistance ___ Intermittent supervision/assistance  ___ Bathe/dress independently ___ Walk with walker     ___ Bathe/dress with assistance ___ Walk Independently    ___ Shower independently ___ Walk with assistance    _X__ Shower with assistance _X__ No alcohol     ___ Return to work/school ________  Special Instructions: 1. Check blood sugars before meals and at bedtime. Contact PCP if blood sugars start running over 150. 2. NO driving for at least 6 months or till cleared by cardiology.  3. Has to wear Life Vest at all times. Can take off for shower--but needs supervision when showering.    COMMUNITY REFERRALS UPON DISCHARGE:     Outpatient: PT     OT    ST                Agency: [facility]  Phone: [phone]              Appointment Date/Time: *Please expect follow-up within 7-10 business days to schedule your appointment. If you have not received follow-up, be sure to contact site directly.   Medical Equipment/Items Ordered: None recommended                                                     My questions have been answered and I understand these instructions. I will adhere to these goals and the provided educational materials after my discharge from the hospital.  Patient/Caregiver Signature _______________________________ Date __________  Clinician Signature _______________________________________ Date __________  Please bring this form and your medication list with you to all your follow-up doctor's appointments.

## 2020-03-21 NOTE — Discharge Summary (Signed)
Physician Discharge Summary  Patient ID: Richard Hatfield MRN: [id number] DOB/AGE: 62-03-1956 62 y.o.  Admit date: 03/08/2020 Discharge date: 03/21/2020  Discharge Diagnoses:  Principal Problem:   Anoxic brain injury [facility]) Active Problems:   Type 2 diabetes mellitus with complication, without long-term current use of insulin (HCC)   Coronary artery disease involving native heart without angina pectoris   Transaminitis   AKI (acute kidney injury) ([place])   Uncontrolled type 2 diabetes mellitus with hyperglycemia (HCC)   Discharged Condition:  Stable   Significant Diagnostic Studies: N/A    Labs:  Basic Metabolic Panel: BMP Latest Ref Rng & Units 03/19/2020 03/12/2020 03/09/2020  Glucose 70 - 99 mg/dL 135(H) 189(H) 259(H)  BUN 8 - 23 mg/dL 14 24(H) 29(H)  Creatinine 0.61 - 1.24 mg/dL 0.78 0.98 1.53(H)  Sodium 135 - 145 mmol/L 139 137 134(L)  Potassium 3.5 - 5.1 mmol/L 4.1 4.8 4.5  Chloride 98 - 111 mmol/L 106 106 101  CO2 22 - 32 mmol/L 22 23 23   Calcium 8.9 - 10.3 mg/dL 9.0 8.8(L) 9.4    CBC: CBC Latest Ref Rng & Units 03/19/2020 03/12/2020 03/09/2020  WBC 4.0 - 10.5 K/uL 5.2 7.6 11.4(H)  Hemoglobin 13.0 - 17.0 g/dL 13.2 13.9 14.2  Hematocrit 39 - 52 % 38.5(L) 40.8 42.1  Platelets 150 - 400 K/uL 264 254 244    CBG: Recent Labs  Lab 03/19/20 1628 03/19/20 2037 03/19/20 2159 03/20/20 0546 03/20/20 1124  GLUCAP 167* 183* 170* 128*  128* 228*     Brief HPI:   Richard Hatfield is a 64 y.o. male with history of HTN, T2DM, ETOH abuse who was admitted on 02/28/20 after cardiac arrest.  Wife performed CPR x8 minutes followed by 12 minutes of CPR/ACLS protocol by EMT for V. tach with ROSC.  He was intubated in ED and underwent emergent cardiac cath revealing significant diffuse CAD with EF of 45 to 50% and no acute thrombus.  Cardiology felt that VT arrest due to transient LAD coronary spasm superimposed on multivessel CAD.  He was maintained on IV heparin and IV Lopressor due to NSVT.  Hospital course significant for issues with confusion as well as bouts of apnea with obtundation after receiving IV Ativan.  CTA head was negative for bleed and CTA perfusion negative for LVO ischemia.  MRI brain showed T2 flair was felt to be due to anoxic brain injury.  He was not felt to be a good candidate for PCI/CT surgery due to neurological deficit.  Held due to abnormal LFTs and cardiac medications being adjusted.  Patient continued to have limitations due to cognitive deficits with poor safety awareness, bouts of confusion as well as bowel rest deficits.  CIR was recommended due to functional deficits.     Hospital Course: Richard Hatfield was admitted to rehab 03/08/2020 for inpatient therapies to consist of PT, ST and OT at least three hours five days a week. Past admission physiatrist, therapy team and rehab RN have worked together to provide customized collaborative inpatient rehab. He is tolerating aspirin and Plavix without side effects and CBC shows H&H and platelets to be relatively stable. He will blood pressures and heart rate were monitored on TID basis and have been well controlled. Carb modified restrictions were added to meals and his blood sugars were monitored ac/hs CBG checks. Lantus has been titrated up to 18 units for tighter control and further adjustments to be done on outpatient basis. Follow-up labs shows abnormal LFTs to be slowly resolving. GERD  symptoms are well controlled on Protonix. Follow-up check of BMET showed that electrolytes is WNL and AKI has resolved. No cardiac symptoms reported with increase in activity. He has made steady progress during his rehab stay and supervision is recommended due to safety concerns. He will continue to receive outpatient PT, OT and ST at [facility] after discharge.    Rehab course: During patient's stay in rehab weekly team conferences were held to monitor patient's progress, set goals and discuss barriers to discharge. At admission,  patient required min to mod assist with ADL tasks and min assist with mobility. He exhibited moderate to severe cognitive deficits with intermittent language of confusion as well as intermittent confabulation. He  has had improvement in activity tolerance, balance, postural control as well as ability to compensate for deficits. He is able to complete ADL at modified independent to supervision level. His cognition has improved and supervision recommended due to deficits in STM, recall of new information as well as lack of awareness or self monitor for errors. He requires supervision for transfers and to ambulate 300' with AD. BERG balance score 48/56 at discharge. Family education completed with wife.    Discharge disposition: 01-Home or Self Care.  Diet: Heart Healthy/Carb modified.   Special Instructions: 1. Monitor BS ac/hs and record. 2. No driving for 6 months or till cleared by cardiology. 3. Needs to wear LifeVest at all times--can take off for shower with supervision.    Discharge Instructions    Amb Referral to Cardiac Rehabilitation   Complete by: As directed    Diagnosis: STEMI   After initial evaluation and assessments completed: Virtual Based Care may be provided alone or in conjunction with Phase 2 Cardiac Rehab based on patient barriers.: Yes   Ambulatory referral to Occupational Therapy   Complete by: As directed    1-2 weeks TC appt   Ambulatory referral to Physical Medicine Rehab   Complete by: As directed    1-2 weeks TC appt with Naaman Plummer or [place]   Ambulatory referral to Physical Therapy   Complete by: As directed    1-2 weeks TC appt   Ambulatory referral to Speech Therapy   Complete by: As directed    1-2 weeks TC appt     Allergies as of 03/20/2020   No Known Allergies     Medication List    STOP taking these medications   diphenhydrAMINE 50 MG/ML injection Commonly known as: BENADRYL   feeding supplement (GLUCERNA SHAKE) Liqd   metFORMIN 500 MG  tablet Commonly known as: GLUCOPHAGE   senna-docusate 8.6-50 MG tablet Commonly known as: Senokot-S     TAKE these medications   acetaminophen 325 MG tablet Commonly known as: TYLENOL Take 1-2 tablets (325-650 mg total) by mouth every 4 (four) hours as needed for mild pain.   amLODipine 5 MG tablet Commonly known as: NORVASC Take 1 tablet (5 mg total) by mouth daily.   aspirin 81 MG chewable tablet Chew 1 tablet (81 mg total) by mouth daily.   calcium carbonate 500 MG chewable tablet Commonly known as: TUMS - dosed in mg elemental calcium Chew 1 tablet (200 mg of elemental calcium total) by mouth 2 (two) times daily. Notes to patient: For indigestion--can taper as needed   clopidogrel 75 MG tablet Commonly known as: PLAVIX Take 1 tablet (75 mg total) by mouth daily.   empagliflozin 25 MG Tabs tablet Commonly known as: Jardiance Take 1 tablet (25 mg total) by mouth daily.  insulin aspart 100 UNIT/ML injection Commonly known as: novoLOG Inject 8 Units into the skin 3 (three) times daily before meals.   insulin degludec 100 UNIT/ML FlexTouch Pen Commonly known as: TRESIBA Inject 0.18 mLs (18 Units total) into the skin 2 (two) times daily.   irbesartan 75 MG tablet Commonly known as: AVAPRO Take 0.5 tablets (37.5 mg total) by mouth daily.   metoprolol tartrate 25 MG tablet Commonly known as: LOPRESSOR Take 1 tablet (25 mg total) by mouth 2 (two) times daily.   multivitamin with minerals Tabs tablet Take 1 tablet by mouth daily.   pantoprazole 20 MG tablet Commonly known as: PROTONIX Take 1 tablet (20 mg total) by mouth daily. Notes to patient: Takes place of prilosec ( to avoid interaction with plavix)   polyethylene glycol 17 g packet Commonly known as: MIRALAX / GLYCOLAX Take 17 g by mouth 2 (two) times daily. What changed: when to take this   rosuvastatin 40 MG tablet Commonly known as: CRESTOR Take 1 tablet (40 mg total) by mouth daily.   traZODone 50  MG tablet Commonly known as: DESYREL Take 0.5-1 tablets (25-50 mg total) by mouth at bedtime as needed for sleep.       Follow-up Information    Meredith Staggers, MD Follow up.   Specialty: Physical Medicine and Rehabilitation Why: Office will call you with follow up appointment Contact information: [place] [address] [phone]        [facility]. Call.   Why: for follow up appointment Contact information: [address]     [place] [address] [phone]       Deberah Pelton, NP Follow up on 04/11/2020.   Specialty: Cardiology Why: follow up appointment 2:45 pm Contact information: [address] [place] [address] [phone]        Tisovec, Fransico Him, MD. Call.   Specialty: Internal Medicine Why: for post hospital follow up Contact information: [place] [address] [phone]               Signed: Bary Leriche 03/26/2020, 1:12 PM

## 2020-03-21 NOTE — Progress Notes (Signed)
Inpatient Rehabilitation Care Coordinator  Discharge Note  The overall goal for the admission was met for:   Discharge location: Yes. D/c to home with his wife.   Length of Stay: Yes. 11 days.   Discharge activity level: Yes. Supervision.  Home/community participation: Yes. Limited.   Services provided included: MD, RD, PT, OT, SLP, RN, CM, TR, Pharmacy, Neuropsych and SW  Financial Services: Private Insurance: [place]  Follow-up services arranged: Outpatient: [facility] for PT/OT/SLP  Comments (or additional information): contact pt wife [name] [phone]  Patient/Family verbalized understanding of follow-up arrangements: Yes  Individual responsible for coordination of the follow-up plan: Pt to have assistance with coordinating care needs.   Confirmed correct DME delivered: Rana Snare 03/21/2020    Rana Snare

## 2020-03-26 DIAGNOSIS — Z95810 Presence of automatic (implantable) cardiac defibrillator: Secondary | ICD-10-CM

## 2020-03-28 ENCOUNTER — Encounter
Payer: BC Managed Care – PPO | Attending: Physical Medicine & Rehabilitation | Admitting: Physical Medicine & Rehabilitation

## 2020-03-28 ENCOUNTER — Other Ambulatory Visit: Payer: Self-pay

## 2020-03-28 ENCOUNTER — Encounter: Payer: Self-pay | Admitting: Physical Medicine & Rehabilitation

## 2020-03-28 VITALS — BP 108/62 | HR 53 | Temp 98.7°F | Ht 68.0 in | Wt 193.0 lb

## 2020-03-28 DIAGNOSIS — G931 Anoxic brain damage, not elsewhere classified: Secondary | ICD-10-CM | POA: Diagnosis not present

## 2020-03-28 NOTE — Patient Instructions (Addendum)
PLEASE FEEL FREE TO CALL OUR OFFICE WITH ANY PROBLEMS OR QUESTIONS ([phone])   WORK ON HIGHER LEVEL BALANCE AT HOME. TRY WALKING ON GRASS AND UNEVEN SURFACES.

## 2020-03-28 NOTE — Progress Notes (Signed)
Subjective:    Patient ID: Richard Hatfield, male    DOB: [birth year], 64 y.o.   MRN: [id number]  HPI   Mr. Rosas is here for a TC visit after admission for anoxic brain injury.   He continues with his life vest. He changes the batteries daily. He is going to bathroom himself. His wife organizes his meds but he is not using cues to take them. He's injecting his own insulin. He feels that his memory is getting back to near baseline.   His sugars have been under reasonable control and improving over the last week in particular with sugars generally in the 100's.   His sleep has been regular. He falls asleep fairly easily and sleeps 7-10 hours per night. His mood has been positive.   Bowels and bladder were a little inconsistent when he first got home. Now he's on a normal routine. He usually moves his bowels in the morning. He's taking a softener.   He has had some intermittent chest pain but it's been musculoskeletal d/t CPR.    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 He's on site, in the car, etc as part of his work..   Pain Inventory Average Pain 0 Pain Right Now 0 My pain is no pain  In the last 24 hours, has pain interfered with the following? General activity 0 Relation with others 0 Enjoyment of life 0 What TIME of day is your pain at its worst? no pain Sleep (in general) Good  Pain is worse with:  no pain Pain improves with:  no pain Relief from Meds:  no pain  Mobility walk without assistance ability to climb steps?  yes do you drive?  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 No pertinent family history. Social History   Socioeconomic History  . Marital status: Married    Spouse name: Mauro Arps  . Number of children: Not on file  .  Years of education: Not on file  . Highest education level: Not on file  Occupational History  . Not on file  Tobacco Use  . Smoking status: Never Smoker  . Smokeless tobacco: Never Used  Vaping Use  . Vaping Use: Never used  Substance and Sexual Activity  . Alcohol use: Yes  . Drug use: Never  . Sexual activity: Not Currently    Partners: Female  Other Topics Concern  . Not on file  Social History Narrative  . Not on file   Social Determinants of Health   Financial Resource Strain:   . Difficulty of Paying Living Expenses:   Food Insecurity:   . Worried About Charity fundraiser in the Last Year:   . Arboriculturist in the Last Year:   Transportation Needs:   . Film/video editor (Medical):   Marland Kitchen Lack of Transportation (Non-Medical):   Physical Activity:   . Days of Exercise per Week:   . Minutes of Exercise per Session:   Stress:   . Feeling of Stress :   Social Connections:   . Frequency of Communication with Friends and Family:   . Frequency of Social Gatherings with Friends and Family:   . Attends Religious Services:   . Active Member of Clubs or Organizations:   . Attends Club or  Organization Meetings:   Marland Kitchen Marital Status:    Past Surgical History:  Procedure Laterality Date  . LEFT HEART CATH AND CORONARY ANGIOGRAPHY N/A 02/28/2020   Procedure: LEFT HEART CATH AND CORONARY ANGIOGRAPHY;  Surgeon: Troy Sine, MD;  Location: [place] [facility];  Service: Cardiovascular;  Laterality: N/A;   Past Medical History:  Diagnosis Date  . Cardiac arrest ([place]) 02/28/2020  . Diabetes mellitus without complication ([place])   . Hypertension    BP 108/62   Pulse (!) 53   Temp 98.7 F (37.1 C)   Ht 5\' 8"  (1.727 m)   Wt 193 lb (87.5 kg)   SpO2 97%   BMI 29.35 kg/m   Opioid Risk Score:   Fall Risk Score:  `1  Depression screen PHQ 2/9  No flowsheet data found. Review of Systems  Constitutional: Negative.   HENT: Negative.   Eyes: Negative.   Respiratory:  Negative.   Cardiovascular: Negative.   Gastrointestinal: Negative.   Endocrine: Negative.   Genitourinary: Negative.   Musculoskeletal: Negative.   Allergic/Immunologic: Negative.   Neurological: Negative.   Psychiatric/Behavioral: Negative.   All other systems reviewed and are negative.      Objective:   Physical Exam Gen: no distress, normal appearing HEENT: oral mucosa pink and moist, NCAT Cardio: Reg rate Chest: normal effort, normal rate of breathing.  LifeVest in place Abd: soft, non-distended Ext: no edema Skin: intact Neuro:alert and oriented.  Intact basic recall for short and long-term information.  Cranial nerve exam intact.  A little distracted and can be a bit tangential but for the most part very appropriate.  Slightly impulsive but this is minimal.  Strength is 5 out of 5 in all 4.  Sensory exams intact.  Gait slightly wide-based but very functional.  We tried tandem gait and he did struggle with balance initially but eventually was able to correct for himself and did much better. Musculoskeletal: Mild chest wall discomfort. Psych: pleasant, normal affect        Assessment & Plan:  1.  Functional and cognitive deficits after anoxic brain injury due to prolonged cardiac arrest.  Patient has made extraordinary progress and cognition nearly back to baseline.  His gait is fairly functional as well.  -discussed HEP  -doesn't need neurology followup, especially 6 weeks from now as scheduled  -outpt therapy never contacted him. In all honesty he doesn't need it either at this point.  -would like for him to work on balance at home on un-even surfaces.   -And he returned to work ultimately will be dependent upon his cardiac status 2.  Acute coronary syndrome with diffuse CAD.  -Continue aspirin Plavix Avapro Lopressor and Crestor  -Recent LifeVest placement  -Cards and CVTS follow up as planned 3.  Type 2 diabetes  -Insulin Jardiance and Metformin 4. Sleep  wake  -continue to target 8-10 hours per night.   30 minutes of face to face patient care time were spent during this visit. All questions were encouraged and answered.  Follow up with me PRN

## 2020-04-02 ENCOUNTER — Telehealth: Payer: Self-pay

## 2020-04-02 ENCOUNTER — Ambulatory Visit (INDEPENDENT_AMBULATORY_CARE_PROVIDER_SITE_OTHER): Payer: BC Managed Care – PPO | Admitting: Cardiothoracic Surgery

## 2020-04-02 ENCOUNTER — Other Ambulatory Visit: Payer: Self-pay | Admitting: *Deleted

## 2020-04-02 ENCOUNTER — Other Ambulatory Visit: Payer: Self-pay

## 2020-04-02 VITALS — BP 123/70 | HR 62 | Temp 97.6°F | Resp 20 | Ht 68.0 in | Wt 192.0 lb

## 2020-04-02 DIAGNOSIS — I2510 Atherosclerotic heart disease of native coronary artery without angina pectoris: Secondary | ICD-10-CM

## 2020-04-02 NOTE — Telephone Encounter (Signed)
Surgery precert initiated.  SVX#[id number].  Clinicals faxed to [phone].  Call ID [phone].

## 2020-04-03 NOTE — Progress Notes (Signed)
Your procedure is scheduled on Friday August 20.  Report to [facility] Main Entrance "A" at 05:30 A.M., and check in at the Admitting office.  Call this number if you have problems the morning of surgery: [phone]  Call [phone] if you have any questions prior to your surgery date Monday-Friday 8am-4pm   Remember: Do not eat or drink after midnight the night before your surgery  Take these medicines the morning of surgery with A SIP OF WATER: amLODipine (NORVASC) metoprolol tartrate (LOPRESSOR)  pantoprazole (PROTONIX)  rosuvastatin (CRESTOR)  If needed: acetaminophen (TYLENOL)   Follow your surgeon's instructions on when to stop Aspirin and clopidogrel (PLAVIX).  If no instructions were given by your surgeon then you will need to call the office to get those instructions.     As of today, STOP taking any Aspirin (unless otherwise instructed by your surgeon), Aleve, Naproxen, Ibuprofen, Motrin, Advil, Goody's, BC's, all herbal medications, fish oil, and all vitamins.   WHAT DO I DO ABOUT MY DIABETES MEDICATION?  . DAY/ NIGHT BEFORE SURGERY:  o empagliflozin (JARDIANCE) - DO NOT take o insulin aspart (NOVOLOG)   - AM dose - take as usual - PM dose - DO NOT take o insulin degludec (TRESIBA)  - AM dose - take as usual - PM dose - take 9 units   . THE MORNING OF SURGERY:  o empagliflozin (JARDIANCE) - DO NOT take o insulin aspart (NOVOLOG)  - If your CBG is greater than 220 mg/dL, you may take  of your sliding scale (correction) dose of insulin.   DO NOT TAKE ANY ORAL DIABETIC MEDICATIONS DAY OF SURGERY   HOW TO MANAGE YOUR DIABETES BEFORE AND AFTER SURGERY  Why is it important to control my blood sugar before and after surgery? . Improving blood sugar levels before and after surgery helps healing and can limit problems. . A way of improving blood sugar control is eating a healthy diet by: o  Eating less sugar and carbohydrates o  Increasing  activity/exercise o  Talking with your doctor about reaching your blood sugar goals . High blood sugars (greater than 180 mg/dL) can raise your risk of infections and slow your recovery, so you will need to focus on controlling your diabetes during the weeks before surgery. . Make sure that the doctor who takes care of your diabetes knows about your planned surgery including the date and location.  How do I manage my blood sugar before surgery? . Check your blood sugar at least 4 times a day, starting 2 days before surgery, to make sure that the level is not too high or low. . Check your blood sugar the morning of your surgery when you wake up and every 2 hours until you get to the Short Stay unit. o If your blood sugar is less than 70 mg/dL, you will need to treat for low blood sugar: - Do not take insulin. - Treat a low blood sugar (less than 70 mg/dL) with  cup of clear juice (cranberry or apple), 4 glucose tablets, OR glucose gel. - Recheck blood sugar in 15 minutes after treatment (to make sure it is greater than 70 mg/dL). If your blood sugar is not greater than 70 mg/dL on recheck, call [phone] for further instructions. . Report your blood sugar to the short stay nurse when you get to Short Stay.  . If you are admitted to the hospital after surgery: o Your blood sugar will be checked by the staff  and you will probably be given insulin after surgery (instead of oral diabetes medicines) to make sure you have good blood sugar levels. o The goal for blood sugar control after surgery is 80-180 mg/dL.     The Morning of Surgery  Do not wear jewelry  Do not wear lotions, powders, colognes, or deodorant  Men may shave face and neck.  Do not bring valuables to the hospital.  [facility] is not responsible for any belongings or valuables.  If you are a smoker, DO NOT Smoke 24 hours prior to surgery  If you wear a CPAP at night please bring your mask the morning of surgery   Remember  that you must have someone to transport you home after your surgery, and remain with you for 24 hours if you are discharged the same day.   Please bring cases for contacts, glasses, hearing aids, dentures or bridgework because it cannot be worn into surgery.    Leave your suitcase in the car.  After surgery it may be brought to your room.  For patients admitted to the hospital, discharge time will be determined by your treatment team.  Patients discharged the day of surgery will not be allowed to drive home.    Special instructions:   [place]- Preparing For Surgery  Before surgery, you can play an important role. Because skin is not sterile, your skin needs to be as free of germs as possible. You can reduce the number of germs on your skin by washing with CHG (chlorahexidine gluconate) Soap before surgery.  CHG is an antiseptic cleaner which kills germs and bonds with the skin to continue killing germs even after washing.    Oral Hygiene is also important to reduce your risk of infection.  Remember - BRUSH YOUR TEETH THE MORNING OF SURGERY WITH YOUR REGULAR TOOTHPASTE  Please do not use if you have an allergy to CHG or antibacterial soaps. If your skin becomes reddened/irritated stop using the CHG.  Do not shave (including legs and underarms) for at least 48 hours prior to first CHG shower. It is OK to shave your face.  Please follow these instructions carefully.   1. Shower the NIGHT BEFORE SURGERY and the MORNING OF SURGERY with CHG Soap.   2. If you chose to wash your hair and body, wash as usual with your normal shampoo and body-wash/soap.  3. Rinse your hair and body thoroughly to remove the shampoo and soap.  4. Apply CHG directly to the skin (ONLY FROM THE NECK DOWN) and wash gently with a scrungie or a clean washcloth.   5. Do not use on open wounds or open sores. Avoid contact with your eyes, ears, mouth and genitals (private parts). Wash Face and genitals (private  parts)  with your normal soap.   6. Wash thoroughly, paying special attention to the area where your surgery will be performed.  7. Thoroughly rinse your body with warm water from the neck down.  8. DO NOT shower/wash with your normal soap after using and rinsing off the CHG Soap.  9. Pat yourself dry with a CLEAN TOWEL.  10. Wear CLEAN PAJAMAS to bed the night before surgery  11. Place CLEAN SHEETS on your bed the night of your first shower and DO NOT SLEEP WITH PETS.  12. Wear comfortable clothes the morning of surgery.     Day of Surgery:  Please shower the morning of surgery with the CHG soap Do not apply any deodorants/lotions.  Please wear clean clothes to the hospital/surgery center.   Remember to brush your teeth WITH YOUR REGULAR TOOTHPASTE.   Please read over the following fact sheets that you were given.

## 2020-04-04 ENCOUNTER — Other Ambulatory Visit: Payer: Self-pay

## 2020-04-04 ENCOUNTER — Encounter (HOSPITAL_COMMUNITY): Payer: Self-pay

## 2020-04-04 ENCOUNTER — Other Ambulatory Visit (HOSPITAL_COMMUNITY)
Admission: RE | Admit: 2020-04-04 | Discharge: 2020-04-04 | Disposition: A | Discharge status: Home / Self Care | Payer: BC Managed Care – PPO | Source: Ambulatory Visit | Attending: Cardiothoracic Surgery | Admitting: Cardiothoracic Surgery

## 2020-04-04 ENCOUNTER — Encounter (HOSPITAL_COMMUNITY)
Admission: RE | Admit: 2020-04-04 | Discharge: 2020-04-04 | Disposition: A | Discharge status: Home / Self Care | Payer: BC Managed Care – PPO | Source: Ambulatory Visit | Attending: Cardiothoracic Surgery | Admitting: Cardiothoracic Surgery

## 2020-04-04 ENCOUNTER — Ambulatory Visit (HOSPITAL_COMMUNITY)
Admission: RE | Admit: 2020-04-04 | Discharge: 2020-04-04 | Disposition: A | Discharge status: Home / Self Care | Payer: BC Managed Care – PPO | Source: Ambulatory Visit | Attending: Cardiothoracic Surgery | Admitting: Cardiothoracic Surgery

## 2020-04-04 ENCOUNTER — Ambulatory Visit (HOSPITAL_BASED_OUTPATIENT_CLINIC_OR_DEPARTMENT_OTHER)
Admission: RE | Admit: 2020-04-04 | Discharge: 2020-04-04 | Disposition: A | Discharge status: Home / Self Care | Payer: BC Managed Care – PPO | Source: Ambulatory Visit | Attending: Cardiothoracic Surgery | Admitting: Cardiothoracic Surgery

## 2020-04-04 DIAGNOSIS — I2510 Atherosclerotic heart disease of native coronary artery without angina pectoris: Secondary | ICD-10-CM | POA: Insufficient documentation

## 2020-04-04 DIAGNOSIS — Z20822 Contact with and (suspected) exposure to covid-19: Secondary | ICD-10-CM | POA: Insufficient documentation

## 2020-04-04 DIAGNOSIS — Z01812 Encounter for preprocedural laboratory examination: Secondary | ICD-10-CM | POA: Insufficient documentation

## 2020-04-04 HISTORY — DX: Acute myocardial infarction, unspecified: I21.9

## 2020-04-04 LAB — CBC
HCT: 40.2 % (ref 39.0–52.0)
Hemoglobin: 13.6 g/dL (ref 13.0–17.0)
MCH: 31.9 pg (ref 26.0–34.0)
MCHC: 33.8 g/dL (ref 30.0–36.0)
MCV: 94.4 fL (ref 80.0–100.0)
Platelets: 179 10*3/uL (ref 150–400)
RBC: 4.26 MIL/uL (ref 4.22–5.81)
RDW: 11.6 % (ref 11.5–15.5)
WBC: 5.2 10*3/uL (ref 4.0–10.5)
nRBC: 0 % (ref 0.0–0.2)

## 2020-04-04 LAB — BLOOD GAS, ARTERIAL
Acid-base deficit: 2.6 mmol/L — ABNORMAL HIGH (ref 0.0–2.0)
Bicarbonate: 21.6 mmol/L (ref 20.0–28.0)
Drawn by: 421801
FIO2: 21
O2 Saturation: 97.4 %
Patient temperature: 37
pCO2 arterial: 36.6 mmHg (ref 32.0–48.0)
pH, Arterial: 7.389 (ref 7.350–7.450)
pO2, Arterial: 93 mmHg (ref 83.0–108.0)

## 2020-04-04 LAB — COMPREHENSIVE METABOLIC PANEL
ALT: 43 U/L (ref 0–44)
AST: 33 U/L (ref 15–41)
Albumin: 3.7 g/dL (ref 3.5–5.0)
Alkaline Phosphatase: 61 U/L (ref 38–126)
Anion gap: 9 (ref 5–15)
BUN: 12 mg/dL (ref 8–23)
CO2: 21 mmol/L — ABNORMAL LOW (ref 22–32)
Calcium: 9.6 mg/dL (ref 8.9–10.3)
Chloride: 107 mmol/L (ref 98–111)
Creatinine, Ser: 0.8 mg/dL (ref 0.61–1.24)
GFR calc Af Amer: >60 mL/min (ref >60)
GFR calc non Af Amer: >60 mL/min (ref >60)
Glucose, Bld: 249 mg/dL — ABNORMAL HIGH (ref 70–99)
Potassium: 3.9 mmol/L (ref 3.5–5.1)
Sodium: 137 mmol/L (ref 135–145)
Total Bilirubin: 0.5 mg/dL (ref 0.3–1.2)
Total Protein: 7 g/dL (ref 6.5–8.1)

## 2020-04-04 LAB — URINALYSIS, ROUTINE W REFLEX MICROSCOPIC
Bacteria, UA: NONE SEEN
Bilirubin Urine: NEGATIVE
Glucose, UA: >=500 mg/dL — AB
Hgb urine dipstick: NEGATIVE
Ketones, ur: NEGATIVE mg/dL
Leukocytes,Ua: NEGATIVE
Nitrite: NEGATIVE
Protein, ur: NEGATIVE mg/dL
Specific Gravity, Urine: 1.026 (ref 1.005–1.030)
pH: 5 (ref 5.0–8.0)

## 2020-04-04 LAB — HEMOGLOBIN A1C
Hgb A1c MFr Bld: 8.2 % — ABNORMAL HIGH (ref 4.8–5.6)
Mean Plasma Glucose: 188.64 mg/dL

## 2020-04-04 LAB — APTT: aPTT: 32 seconds (ref 24–36)

## 2020-04-04 LAB — TYPE AND SCREEN
ABO/RH(D): A POS
Antibody Screen: NEGATIVE

## 2020-04-04 LAB — PROTIME-INR
INR: 1.1 (ref 0.8–1.2)
Prothrombin Time: 13.4 seconds (ref 11.4–15.2)

## 2020-04-04 LAB — SARS CORONAVIRUS 2 (TAT 6-24 HRS): SARS Coronavirus 2: NEGATIVE

## 2020-04-04 LAB — SURGICAL PCR SCREEN
MRSA, PCR: NEGATIVE
Staphylococcus aureus: NEGATIVE

## 2020-04-04 LAB — GLUCOSE, CAPILLARY: Glucose-Capillary: 250 mg/dL — ABNORMAL HIGH (ref 70–99)

## 2020-04-04 NOTE — Progress Notes (Signed)
PCP Osborne Casco, MD Cardiologist Claiborne Billings, MD    Chest x-ray - 04/04/20 EKG - 04/04/20 Stress Test - pt denies ECHO - 02/28/20 Cardiac Cath - 02/28/20    Fasting Blood Sugar - pt is not sure Checks Blood Sugar 2-3x a day  Blood Thinner Instructions: per pt/surgeon's orders to stop Plavix 8/16 (last dose per pt 8/16) Aspirin Instructions: Follow your surgeon's instructions on when to stop Aspirin.  If no instructions were given by your surgeon then you will need to call the office to get those instructions.       COVID TEST- 04/04/20  Coronavirus Screening  Have you experienced the following symptoms:  Cough yes/no: No Fever (>100.52F)  yes/no: No Runny nose yes/no: No Sore throat yes/no: No Difficulty breathing/shortness of breath  yes/no: No  Have you or a family member traveled in the last 14 days and where? yes/no: No   If the patient indicates "YES" to the above questions, their PAT will be rescheduled to limit the exposure to others and, the surgeon will be notified. THE PATIENT WILL NEED TO BE ASYMPTOMATIC FOR 14 DAYS.   If the patient is not experiencing any of these symptoms, the PAT nurse will instruct them to NOT bring anyone with them to their appointment since they may have these symptoms or traveled as well.   Please remind your patients and families that hospital visitation restrictions are in effect and the importance of the restrictions.     Anesthesia review: yes - recent STEMI admission 02/2020  Patient denies shortness of breath, fever, cough and chest pain at PAT appointment   All instructions explained to the patient, with a verbal understanding of the material. Patient agrees to go over the instructions while at home for a better understanding. Patient also instructed to self quarantine after being tested for COVID-19. The opportunity to ask questions was provided.

## 2020-04-04 NOTE — Progress Notes (Signed)
Pre cabg has been completed.   Preliminary results in CV Proc.   Richard Hatfield 04/04/2020 9:50 AM

## 2020-04-05 MED ORDER — INSULIN REGULAR(HUMAN) IN NACL 100-0.9 UT/100ML-% IV SOLN
INTRAVENOUS | Status: AC
  Administered 2020-04-06: 8.5 [IU]/h via INTRAVENOUS
  Filled 2020-04-05: qty 100

## 2020-04-05 MED ORDER — MAGNESIUM SULFATE 50 % IJ SOLN
40.0000 meq | INTRAMUSCULAR | Status: DC
  Filled 2020-04-05: qty 9.85

## 2020-04-05 MED ORDER — SODIUM CHLORIDE 0.9 % IV SOLN
1.5000 g | INTRAVENOUS | Status: AC
  Administered 2020-04-06: 1.5 g via INTRAVENOUS
  Filled 2020-04-05: qty 1.5

## 2020-04-05 MED ORDER — DEXMEDETOMIDINE HCL IN NACL 400 MCG/100ML IV SOLN
0.1000 ug/kg/h | INTRAVENOUS | Status: AC
  Administered 2020-04-06: .2 ug/kg/h via INTRAVENOUS
  Filled 2020-04-05: qty 100

## 2020-04-05 MED ORDER — SODIUM CHLORIDE 0.9 % IV SOLN
750.0000 mg | INTRAVENOUS | Status: AC
  Administered 2020-04-06: 750 mg via INTRAVENOUS
  Filled 2020-04-05: qty 750

## 2020-04-05 MED ORDER — MILRINONE LACTATE IN DEXTROSE 20-5 MG/100ML-% IV SOLN
0.3000 ug/kg/min | INTRAVENOUS | Status: DC
  Filled 2020-04-05: qty 100

## 2020-04-05 MED ORDER — TRANEXAMIC ACID (OHS) PUMP PRIME SOLUTION
2.0000 mg/kg | INTRAVENOUS | Status: DC
  Filled 2020-04-05: qty 1.77

## 2020-04-05 MED ORDER — TRANEXAMIC ACID 1000 MG/10ML IV SOLN
1.5000 mg/kg/h | INTRAVENOUS | Status: AC
  Administered 2020-04-06: 1.5 mg/kg/h via INTRAVENOUS
  Filled 2020-04-05: qty 25

## 2020-04-05 MED ORDER — EPINEPHRINE HCL 5 MG/250ML IV SOLN IN NS
0.0000 ug/min | INTRAVENOUS | Status: DC
  Filled 2020-04-05: qty 250

## 2020-04-05 MED ORDER — PLASMA-LYTE 148 IV SOLN
INTRAVENOUS | Status: DC
  Filled 2020-04-05: qty 2.5

## 2020-04-05 MED ORDER — TRANEXAMIC ACID (OHS) BOLUS VIA INFUSION
15.0000 mg/kg | INTRAVENOUS | Status: AC
  Administered 2020-04-06: 1326 mg via INTRAVENOUS
  Filled 2020-04-05: qty 1326

## 2020-04-05 MED ORDER — VANCOMYCIN HCL 1500 MG/300ML IV SOLN
1500.0000 mg | INTRAVENOUS | Status: AC
  Administered 2020-04-06: 1500 mg via INTRAVENOUS
  Filled 2020-04-05: qty 300

## 2020-04-05 MED ORDER — NOREPINEPHRINE 4 MG/250ML-% IV SOLN
0.0000 ug/min | INTRAVENOUS | Status: DC
  Filled 2020-04-05: qty 250

## 2020-04-05 MED ORDER — PHENYLEPHRINE HCL-NACL 20-0.9 MG/250ML-% IV SOLN
30.0000 ug/min | INTRAVENOUS | Status: DC
  Filled 2020-04-05: qty 250

## 2020-04-05 MED ORDER — NITROGLYCERIN IN D5W 200-5 MCG/ML-% IV SOLN
2.0000 ug/min | INTRAVENOUS | Status: AC
  Administered 2020-04-06: 5 ug/min via INTRAVENOUS
  Filled 2020-04-05: qty 250

## 2020-04-05 MED ORDER — POTASSIUM CHLORIDE 2 MEQ/ML IV SOLN
80.0000 meq | INTRAVENOUS | Status: DC
  Filled 2020-04-05: qty 40

## 2020-04-05 MED ORDER — SODIUM CHLORIDE 0.9 % IV SOLN
INTRAVENOUS | Status: DC
  Filled 2020-04-05: qty 30

## 2020-04-05 NOTE — Progress Notes (Signed)
[address]       [place] [address]             [phone]     CARDIOTHORACIC SURGERY outpatient visitation  Referring Provider is Troy Sine, MD Primary Cardiologist is Shelva Majestic, MD PCP is System, Pcp Not In  Chief Complaint  Patient presents with  . Coronary Artery Disease    Further discuss surgery, cardiac cath 02/28/20    HPI:  64 year old man was in his usual state of health until he experienced cardiac arrest at home and for around 02/28/2020.  He underwent 20 minutes of CPR and was ultimately fortunate to experience return of spontaneous circulation.  He was taken to [facility] where he underwent urgent left heart catheterization demonstrating severe multivessel coronary artery disease without clear culprit lesion.  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  He was discharged from the hospital to inpatient rehab and continue to improve.  At this point he is considered to be back to baseline in regards to his cognitive and physical conditioning.  He presents as an outpatient for consideration of coronary artery bypass grafting.  Past Medical History:  Diagnosis Date  . Cardiac arrest ([place]) 02/28/2020  . Coronary artery disease 02/2020   pt admitted for STEMI in 02/2020 (per pt)  . Diabetes mellitus without complication ([place])   . Hypertension   . Myocardial infarction [facility])    per pt he had a heart attack earlier (02/2020) - see encounters    Past Surgical History:  Procedure Laterality Date  . LEFT HEART CATH AND CORONARY ANGIOGRAPHY N/A 02/28/2020   Procedure: LEFT HEART CATH AND CORONARY ANGIOGRAPHY;  Surgeon: Troy Sine, MD;  Location: [facility];  Service: Cardiovascular;  Laterality: N/A;    No family history on file.  Social History   Socioeconomic History  . Marital status: Married    Spouse name: Marguerite Jarboe  . Number of  children: Not on file  . Years of education: Not on file  . Highest education level: Not on file  Occupational History  . Not on file  Tobacco Use  . Smoking status: Never Smoker  . Smokeless tobacco: Never Used  Vaping Use  . Vaping Use: Never used  Substance and Sexual Activity  . Alcohol use: Not Currently  . Drug use: Never  . Sexual activity: Not Currently    Partners: Female  Other Topics Concern  . Not on file  Social History Narrative  . Not on file   Social Determinants of Health   Financial Resource Strain:   . Difficulty of Paying Living Expenses: Not on file  Food Insecurity:   . Worried About Charity fundraiser in the Last Year: Not on file  . Ran Out of Food in the Last Year: Not on file  Transportation Needs:   . Lack of Transportation (Medical): Not on file  . Lack of Transportation (Non-Medical): Not on file  Physical Activity:   . Days of Exercise per Week: Not on file  . Minutes of Exercise per Session: Not on file  Stress:   . Feeling of Stress : Not on file  Social Connections:   . Frequency of Communication with Friends and Family: Not on file  . Frequency of Social Gatherings with Friends and Family: Not on file  . Attends Religious Services: Not on file  .  Active Member of Clubs or Organizations: Not on file  . Attends Archivist Meetings: Not on file  . Marital Status: Not on file  Intimate Partner Violence:   . Fear of Current or Ex-Partner: Not on file  . Emotionally Abused: Not on file  . Physically Abused: Not on file  . Sexually Abused: Not on file    Current Outpatient Medications  Medication Sig Dispense Refill  . acetaminophen (TYLENOL) 325 MG tablet Take 1-2 tablets (325-650 mg total) by mouth every 4 (four) hours as needed for mild pain.    Marland Kitchen amLODipine (NORVASC) 5 MG tablet Take 1 tablet (5 mg total) by mouth daily. 30 tablet 0  . aspirin 81 MG chewable tablet Chew 1 tablet (81 mg total) by mouth daily.    . calcium  carbonate (TUMS - DOSED IN MG ELEMENTAL CALCIUM) 500 MG chewable tablet Chew 1 tablet (200 mg of elemental calcium total) by mouth 2 (two) times daily. (Patient taking differently: Chew 1 tablet by mouth 3 (three) times daily. ) 60 tablet 0  . clopidogrel (PLAVIX) 75 MG tablet Take 1 tablet (75 mg total) by mouth daily. (Patient not taking: Reported on 04/02/2020) 30 tablet 0  . empagliflozin (JARDIANCE) 25 MG TABS tablet Take 1 tablet (25 mg total) by mouth daily. 30 tablet 0  . insulin aspart (NOVOLOG) 100 UNIT/ML injection Inject 8 Units into the skin 3 (three) times daily before meals.    . insulin degludec (TRESIBA) 100 UNIT/ML FlexTouch Pen Inject 0.18 mLs (18 Units total) into the skin 2 (two) times daily.    . irbesartan (AVAPRO) 75 MG tablet Take 0.5 tablets (37.5 mg total) by mouth daily. 30 tablet 0  . metoprolol tartrate (LOPRESSOR) 25 MG tablet Take 1 tablet (25 mg total) by mouth 2 (two) times daily. 60 tablet 0  . Multiple Vitamin (MULTIVITAMIN WITH MINERALS) TABS tablet Take 1 tablet by mouth daily.    . pantoprazole (PROTONIX) 20 MG tablet Take 1 tablet (20 mg total) by mouth daily. 30 tablet 0  . polyethylene glycol (MIRALAX / GLYCOLAX) 17 g packet Take 17 g by mouth 2 (two) times daily. (Patient taking differently: Take 17 g by mouth daily as needed for moderate constipation. ) 60 each 0  . rosuvastatin (CRESTOR) 40 MG tablet Take 1 tablet (40 mg total) by mouth daily. 30 tablet 0  . traZODone (DESYREL) 50 MG tablet Take 0.5-1 tablets (25-50 mg total) by mouth at bedtime as needed for sleep. 10 tablet 0   No current facility-administered medications for this visit.   Facility-Administered Medications Ordered in Other Visits  Medication Dose Route Frequency Provider Last Rate Last Admin  . [START ON 04/06/2020] cefUROXime (ZINACEF) 1.5 g in sodium chloride 0.9 % 100 mL IVPB  1.5 g Intravenous To OR Jamen Loiseau, Glenice Bow, MD      . Derrill Memo ON 04/06/2020] cefUROXime (ZINACEF) 750 mg in  sodium chloride 0.9 % 100 mL IVPB  750 mg Intravenous To OR Ileene Allie, Glenice Bow, MD      . Derrill Memo ON 04/06/2020] dexmedetomidine (PRECEDEX) 400 MCG/100ML (4 mcg/mL) infusion  0.1-0.7 mcg/kg/hr Intravenous To OR Nysa Sarin, Glenice Bow, MD      . Derrill Memo ON 04/06/2020] EPINEPHrine (ADRENALIN) 4 mg in NS 250 mL (0.016 mg/mL) premix infusion  0-10 mcg/min Intravenous To OR Pedram Goodchild, Glenice Bow, MD      . Derrill Memo ON 04/06/2020] heparin 30,000 units/NS 1000 mL solution for CELLSAVER   Other To OR Jamilett Ferrante Z,  MD      . Derrill Memo ON 04/06/2020] heparin sodium (porcine) 2,500 Units, papaverine 30 mg in electrolyte-148 (PLASMALYTE-148) 500 mL irrigation   Irrigation To OR Zaki Gertsch, Glenice Bow, MD      . Derrill Memo ON 04/06/2020] insulin regular, human (MYXREDLIN) 100 units/ 100 mL infusion   Intravenous To OR Erskin Zinda, Glenice Bow, MD      . Derrill Memo ON 04/06/2020] magnesium sulfate (IV Push/IM) injection 40 mEq  40 mEq Other To OR Theon Sobotka, Glenice Bow, MD      . Derrill Memo ON 04/06/2020] milrinone (PRIMACOR) 20 MG/100 ML (0.2 mg/mL) infusion  0.3 mcg/kg/min Intravenous To OR Jakki Doughty, Glenice Bow, MD      . Derrill Memo ON 04/06/2020] nitroGLYCERIN 50 mg in dextrose 5 % 250 mL (0.2 mg/mL) infusion  2-200 mcg/min Intravenous To OR Jarrin Staley, Glenice Bow, MD      . Derrill Memo ON 04/06/2020] norepinephrine (LEVOPHED) 4mg  in 29mL premix infusion  0-40 mcg/min Intravenous To OR Norma Ignasiak, Glenice Bow, MD      . Derrill Memo ON 04/06/2020] phenylephrine (NEOSYNEPHRINE) 20-0.9 MG/250ML-% infusion  30-200 mcg/min Intravenous To OR Khaza Blansett, Glenice Bow, MD      . Derrill Memo ON 04/06/2020] potassium chloride injection 80 mEq  80 mEq Other To OR Porschia Willbanks, Glenice Bow, MD      . Derrill Memo ON 04/06/2020] tranexamic acid (CYKLOKAPRON) 2,500 mg in sodium chloride 0.9 % 250 mL (10 mg/mL) infusion  1.5 mg/kg/hr Intravenous To OR Benjamim Harnish, Glenice Bow, MD      . Derrill Memo ON 04/06/2020] tranexamic acid (CYKLOKAPRON) bolus via infusion - over 30 minutes 1,326 mg  15 mg/kg Intravenous To OR Caeleigh Prohaska, Glenice Bow, MD      .  Derrill Memo ON 04/06/2020] tranexamic acid (CYKLOKAPRON) pump prime solution 177 mg  2 mg/kg Intracatheter To OR Estefana Taylor, Glenice Bow, MD      . Derrill Memo ON 04/06/2020] vancomycin (VANCOREADY) IVPB 1500 mg/300 mL  1,500 mg Intravenous To OR Jalyn Dutta, Glenice Bow, MD        No Known Allergies    Review of Systems:   General:  Improved energy, no weight change, denies fever  Cardiac:  As per HPI  Respiratory:  Denies shortness of breath or dyspnea on exertion.  GI:   Negative  GU:   Negative  Vascular:  Negative  Neuro:   As per HPI  Musculoskeletal: Negative  Skin:   Negative  Psych:   Negative  Eyes:   Negative  ENT:   Negative  Hematologic:  Negative  Endocrine:  No diabetes, does not check CBG's at home     Physical Exam:   BP 123/70   Pulse 62   Temp 97.6 F (36.4 C) Comment (Src): skin  Resp 20   Ht 5\' 8"  (1.727 m)   Wt 87.1 kg   SpO2 98% Comment: RA  BMI 29.19 kg/m   General:   well-appearing; interactive and conversant  HEENT:  Unremarkable   Neck:   no JVD, no bruits, no adenopathy   Chest:   clear to auscultation, symmetrical breath sounds, no wheezes, no rhonchi   CV:   RRR, no  murmur   Abdomen:  soft, non-tender, no masses   Extremities:  warm, well-perfused, pulses intact throughout, no LE edema  Rectal/GU  Deferred  Neuro:   Grossly non-focal and symmetrical throughout  Skin:   Clean and dry, no rashes, no breakdown   Diagnostic Tests:  I have personally reviewed his available imaging studies including a reevaluation of his left heart  catheterization and echocardiogram performed in July as an inpatient.   Impression:  64 year old man has made a remarkable cover he after cardiac arrest at home with neurological disorder resulting.  He appears in suitable condition for coronary artery bypass graft surgery.   Plan:  Tentatively scheduled coronary artery bypass grafting for later this week.  The patient and his wife understand the pros and cons of undergoing  surgery and have had the opportunity to ask questions to their apparent satisfaction;  they wish to proceed.   I spent in excess of 30 minutes during the conduct of this office consultation and >50% of this time involved direct face-to-face encounter with the patient for counseling and/or coordination of their care.          Level 3 Office Consult = 40 minutes         Level 4 Office Consult = 60 minutes         Level 5 Office Consult = 80 minutes  B.  Murvin Natal, MD 04/05/2020 6:53 PM

## 2020-04-05 NOTE — Progress Notes (Signed)
Anesthesia Chart Review:  Case: [id number] Date/Time: 04/06/20 0715   Procedures:      CORONARY ARTERY BYPASS GRAFTING (CABG) (N/A Chest)     possible RADIAL ARTERY HARVEST (Left Arm Lower)     TRANSESOPHAGEAL ECHOCARDIOGRAM (TEE) (N/A )     INDOCYANINE GREEN FLUORESCENCE IMAGING (ICG) (N/A )   Anesthesia type: General   Pre-op diagnosis: CAD   [facility] / [facility]   Surgeons: Wonda Olds, MD      DISCUSSION: Patient is a 64 year old male scheduled for the above procedure. He is s/p out-of-hospital cardiac arrest on 02/28/20 and ROSC after 20 minutes of CPR/defibrillation. He was found to have severe 3V CAD, but delayed CABG recommended to allow time to recover from some mild functional and cognitive deficits related to anoxic brain injury from prolonged cardiac arrest. He was discharged to [facility] for in-patient rehab (03/08/20-03/21/20). By 03/28/20 follow-up, he had "made extraordinary progress in cognition nearly back to baseline.  His gait is fairly functional as well." Last visit with Dr. Orvan Seen 04/02/20, note pending.   History includes never smoker, HTN, DM2, CAD, NSTEMI (02/28/20 with out of hospital vfib arrest, s/p 20 minute CPR by wife then EMS/epinephrine x2 and successful defibrillation).  Per patient, surgeon instructed last Plavix 04/02/20. 04/04/2020 presurgical COVID-19 test negative.  Anesthesia team to evaluate on the day of surgery.   VS: BP 124/74   Pulse (!) 58   Temp 36.6 C (Oral)   Resp 18   Ht 5\' 9"  (1.753 m)   Wt 88.4 kg   SpO2 98%   BMI 28.78 kg/m    PROVIDERS: Domenick Gong, MD is PCP Shelva Majestic, MD is cardiologist   LABS: Preoperative labs noted.  (all labs ordered are listed, but only abnormal results are displayed)  Labs Reviewed  BLOOD GAS, ARTERIAL - Abnormal; Notable for the following components:      Result Value   Acid-base deficit 2.6 (*)    Allens test (pass/fail) BRACHIAL ARTERY (*)    All other components within normal  limits  COMPREHENSIVE METABOLIC PANEL - Abnormal; Notable for the following components:   CO2 21 (*)    Glucose, Bld 249 (*)    All other components within normal limits  HEMOGLOBIN A1C - Abnormal; Notable for the following components:   Hgb A1c MFr Bld 8.2 (*)    All other components within normal limits  URINALYSIS, ROUTINE W REFLEX MICROSCOPIC - Abnormal; Notable for the following components:   Glucose, UA >=500 (*)    All other components within normal limits  SURGICAL PCR SCREEN  APTT  CBC  PROTIME-INR  TYPE AND SCREEN    Spirometry 03/09/20: FVC 2.81 (63%), FEV1 2.41 (72%), FEV1/FVC 86% (113%).   IMAGES: CXR 04/04/20: FINDINGS: Midline trachea. Borderline cardiomegaly. No pleural effusion or pneumothorax. Resolution of interstitial edema. No lobar consolidation. IMPRESSION: No acute cardiopulmonary disease.   EKG: 04/04/20: Sinus bradycardia at 57 bpm T wave abnormality, consider anterolateral ischemia Abnormal ECG Since last tracing PVCs are no longer present Confirmed by Ena Dawley [phone]) on 04/05/2020 9:36:15 AM   CV: Carotid US 04/04/20: Summary:  - Right Carotid: Velocities in the right ICA are consistent with a 1-39%  stenosis.  - Left Carotid: Velocities in the left ICA are consistent with a 1-39%  stenosis.  - Vertebrals: Bilateral vertebral arteries demonstrate antegrade flow.    Cardiac cath 02/28/20:  Prox LAD to Mid LAD lesion is 60% stenosed.  1st  Diag lesion is 75% stenosed.  Mid LAD lesion is 80% stenosed.  Prox Cx lesion is 70% stenosed.  Prox Cx to Mid Cx lesion is 80% stenosed.  Prox RCA-1 lesion is 20% stenosed.  Prox RCA-2 lesion is 70% stenosed.  RV Branch lesion is 90% stenosed.  Dist RCA-1 lesion is 80% stenosed.  Dist RCA-2 lesion is 85% stenosed.  RPDA lesion is 30% stenosed.  The left ventricular ejection fraction is 45-50% by visual estimate.  LV end diastolic pressure is normal.  There is mild left  ventricular systolic dysfunction.   Out of hospital witnessed VF cardiac arrest with return of ROSC after approximately 20 minutes of CPR and administration of 2 doses of epinephrine.  Significant three-vessel CAD with 60% diffuse proximal LAD stenosis, long diffuse 70% diagonal stenosis and 80% LAD stenosis after the first diagonal vessel; 70 to 80% proximal diffuse circumflex stenosis before a large marginal branch; and very large dominant RCA with 70% proximal stenosis and long diffuse 80 and 85% stenoses beyond the acute margin proximal to the PDA takeoff with mild 30% narrowing in the PDA.  Mild acute LV dysfunction with focal anterolateral hypocontractility and EF estimated 45 to 50%. LVEDP 12 mm  RECOMMENDATION: Suspect transient coronary vasospasm involving the LAD circulation in the etiology of the patient's VF cardiac arrest. Low-dose IV nitroglycerin was started at the end of the catheterization procedure. Patient will be transported to to heart and evaluated by critical care with consideration for hypothermia due to witnessed cardiac arrest. A 2D echo Doppler study will be obtained. We will review angios with colleagues but with diffuse multivessel CAD in this diabetic male consider possible surgical consultation for CABG revascularization following stability.   Echo 02/28/20: IMPRESSIONS  1. Normal wall motion in visualized segments. Mid to distal anterolateral  wall not well visualized on apical images. Consider repeat with echo  contrast for wall motion when patient less agitated.. Left ventricular  ejection fraction, by estimation, is 50  to 55%. The left ventricle has low normal function. The left ventricle  has no regional wall motion abnormalities. There is mild concentric left  ventricular hypertrophy. Left ventricular diastolic parameters were  normal.  2. Right ventricular systolic function is normal. The right ventricular  size is normal. There is mildly elevated  pulmonary artery systolic  pressure.  3. Left atrial size was mildly dilated.  4. The mitral valve is normal in structure. Trivial mitral valve  regurgitation. No evidence of mitral stenosis.  5. The aortic valve has an indeterminant number of cusps. Aortic valve  regurgitation is not visualized. Mild aortic valve stenosis. Aortic valve  mean gradient measures 8.0 mmHg. Aortic valve peak gradient measures  19.9 mmHg. Aortic valve area, by VTI measures 1.33 cm.    Past Medical History:  Diagnosis Date  . Cardiac arrest ([place]) 02/28/2020  . Coronary artery disease 02/2020   pt admitted for STEMI in 02/2020 (per pt)  . Diabetes mellitus without complication ([place])   . Hypertension   . Myocardial infarction [facility])    per pt he had a heart attack earlier (02/2020) - see encounters    Past Surgical History:  Procedure Laterality Date  . LEFT HEART CATH AND CORONARY ANGIOGRAPHY N/A 02/28/2020   Procedure: LEFT HEART CATH AND CORONARY ANGIOGRAPHY;  Surgeon: Troy Sine, MD;  Location: [facility];  Service: Cardiovascular;  Laterality: N/A;    MEDICATIONS: . acetaminophen (TYLENOL) 325 MG tablet  . amLODipine (NORVASC) 5 MG tablet  .  aspirin 81 MG chewable tablet  . calcium carbonate (TUMS - DOSED IN MG ELEMENTAL CALCIUM) 500 MG chewable tablet  . clopidogrel (PLAVIX) 75 MG tablet  . empagliflozin (JARDIANCE) 25 MG TABS tablet  . insulin aspart (NOVOLOG) 100 UNIT/ML injection  . insulin degludec (TRESIBA) 100 UNIT/ML FlexTouch Pen  . irbesartan (AVAPRO) 75 MG tablet  . metoprolol tartrate (LOPRESSOR) 25 MG tablet  . Multiple Vitamin (MULTIVITAMIN WITH MINERALS) TABS tablet  . pantoprazole (PROTONIX) 20 MG tablet  . polyethylene glycol (MIRALAX / GLYCOLAX) 17 g packet  . rosuvastatin (CRESTOR) 40 MG tablet  . traZODone (DESYREL) 50 MG tablet   No current facility-administered medications for this encounter.   Derrill Memo ON 04/06/2020] cefUROXime (ZINACEF) 1.5 g in sodium  chloride 0.9 % 100 mL IVPB  . [START ON 04/06/2020] cefUROXime (ZINACEF) 750 mg in sodium chloride 0.9 % 100 mL IVPB  . [START ON 04/06/2020] dexmedetomidine (PRECEDEX) 400 MCG/100ML (4 mcg/mL) infusion  . [START ON 04/06/2020] EPINEPHrine (ADRENALIN) 4 mg in NS 250 mL (0.016 mg/mL) premix infusion  . [START ON 04/06/2020] heparin 30,000 units/NS 1000 mL solution for CELLSAVER  . [START ON 04/06/2020] heparin sodium (porcine) 2,500 Units, papaverine 30 mg in electrolyte-148 (PLASMALYTE-148) 500 mL irrigation  . [START ON 04/06/2020] insulin regular, human (MYXREDLIN) 100 units/ 100 mL infusion  . [START ON 04/06/2020] magnesium sulfate (IV Push/IM) injection 40 mEq  . [START ON 04/06/2020] milrinone (PRIMACOR) 20 MG/100 ML (0.2 mg/mL) infusion  . [START ON 04/06/2020] nitroGLYCERIN 50 mg in dextrose 5 % 250 mL (0.2 mg/mL) infusion  . [START ON 04/06/2020] norepinephrine (LEVOPHED) 4mg  in 236mL premix infusion  . [START ON 04/06/2020] phenylephrine (NEOSYNEPHRINE) 20-0.9 MG/250ML-% infusion  . [START ON 04/06/2020] potassium chloride injection 80 mEq  . [START ON 04/06/2020] tranexamic acid (CYKLOKAPRON) 2,500 mg in sodium chloride 0.9 % 250 mL (10 mg/mL) infusion  . [START ON 04/06/2020] tranexamic acid (CYKLOKAPRON) bolus via infusion - over 30 minutes 1,326 mg  . [START ON 04/06/2020] tranexamic acid (CYKLOKAPRON) pump prime solution 177 mg  . [START ON 04/06/2020] vancomycin (VANCOREADY) IVPB 1500 mg/300 mL     Myra Gianotti, PA-C Surgical Short Stay/Anesthesiology [facility] Phone [phone] [facility] Phone [phone] 04/05/2020 10:25 AM

## 2020-04-05 NOTE — Anesthesia Preprocedure Evaluation (Addendum)
Anesthesia Evaluation  Patient identified by MRN, date of birth, ID band Patient awake    Reviewed: Allergy & Precautions, NPO status , Patient's Chart, lab work & pertinent test results  Airway Mallampati: II  TM Distance: >3 FB Neck ROM: Full    Dental  (+) Dental Advisory Given   Pulmonary neg pulmonary ROS,    breath sounds clear to auscultation       Cardiovascular hypertension, Pt. on medications and Pt. on home beta blockers + CAD and + Past MI   Rhythm:Regular Rate:Normal  Normal EF. Mild AS  Hx cardiac arrest x 20 min with ROSC   Neuro/Psych negative neurological ROS     GI/Hepatic negative GI ROS, Neg liver ROS,   Endo/Other  diabetes  Renal/GU negative Renal ROS     Musculoskeletal   Abdominal   Peds  Hematology negative hematology ROS (+)   Anesthesia Other Findings   Reproductive/Obstetrics                           Lab Results  Component Value Date   WBC 5.2 04/04/2020   HGB 13.6 04/04/2020   HCT 40.2 04/04/2020   MCV 94.4 04/04/2020   PLT 179 04/04/2020   Lab Results  Component Value Date   CREATININE 0.80 04/04/2020   BUN 12 04/04/2020   NA 137 04/04/2020   K 3.9 04/04/2020   CL 107 04/04/2020   CO2 21 (L) 04/04/2020    Anesthesia Physical Anesthesia Plan  ASA: IV  Anesthesia Plan: General   Post-op Pain Management:    Induction: Intravenous  PONV Risk Score and Plan: 2 and Dexamethasone, Ondansetron and Treatment may vary due to age or medical condition  Airway Management Planned: Oral ETT  Additional Equipment: Arterial line, CVP, PA Cath, TEE and Ultrasound Guidance Line Placement  Intra-op Plan:   Post-operative Plan: Post-operative intubation/ventilation  Informed Consent: I have reviewed the patients History and Physical, chart, labs and discussed the procedure including the risks, benefits and alternatives for the proposed anesthesia with  the patient or authorized representative who has indicated his/her understanding and acceptance.     Dental advisory given  Plan Discussed with: CRNA  Anesthesia Plan Comments: ( )      Anesthesia Quick Evaluation

## 2020-04-06 ENCOUNTER — Inpatient Hospital Stay (HOSPITAL_COMMUNITY)
Admission: RE | Admit: 2020-04-06 | Discharge: 2020-04-11 | DRG: 236 | Disposition: A | Discharge status: Home / Self Care | Payer: BC Managed Care – PPO | Attending: Cardiothoracic Surgery | Admitting: Cardiothoracic Surgery

## 2020-04-06 ENCOUNTER — Inpatient Hospital Stay (HOSPITAL_COMMUNITY): Payer: BC Managed Care – PPO | Admitting: Certified Registered Nurse Anesthetist

## 2020-04-06 ENCOUNTER — Inpatient Hospital Stay (HOSPITAL_COMMUNITY): Payer: BC Managed Care – PPO

## 2020-04-06 ENCOUNTER — Inpatient Hospital Stay (HOSPITAL_COMMUNITY): Payer: BC Managed Care – PPO | Admitting: Vascular Surgery

## 2020-04-06 ENCOUNTER — Inpatient Hospital Stay (HOSPITAL_COMMUNITY)
Admission: RE | Disposition: A | Discharge status: Home / Self Care | Payer: Self-pay | Source: Home / Self Care | Attending: Cardiothoracic Surgery

## 2020-04-06 ENCOUNTER — Encounter (HOSPITAL_COMMUNITY): Payer: Self-pay | Admitting: Cardiothoracic Surgery

## 2020-04-06 ENCOUNTER — Other Ambulatory Visit: Payer: Self-pay

## 2020-04-06 DIAGNOSIS — Z20822 Contact with and (suspected) exposure to covid-19: Secondary | ICD-10-CM | POA: Diagnosis present

## 2020-04-06 DIAGNOSIS — E8770 Fluid overload, unspecified: Secondary | ICD-10-CM | POA: Diagnosis not present

## 2020-04-06 DIAGNOSIS — I10 Essential (primary) hypertension: Secondary | ICD-10-CM | POA: Diagnosis present

## 2020-04-06 DIAGNOSIS — E119 Type 2 diabetes mellitus without complications: Secondary | ICD-10-CM | POA: Diagnosis present

## 2020-04-06 DIAGNOSIS — I252 Old myocardial infarction: Secondary | ICD-10-CM | POA: Diagnosis not present

## 2020-04-06 DIAGNOSIS — I97190 Other postprocedural cardiac functional disturbances following cardiac surgery: Secondary | ICD-10-CM | POA: Diagnosis not present

## 2020-04-06 DIAGNOSIS — Z8674 Personal history of sudden cardiac arrest: Secondary | ICD-10-CM

## 2020-04-06 DIAGNOSIS — D62 Acute posthemorrhagic anemia: Secondary | ICD-10-CM | POA: Diagnosis not present

## 2020-04-06 DIAGNOSIS — I2510 Atherosclerotic heart disease of native coronary artery without angina pectoris: Principal | ICD-10-CM

## 2020-04-06 DIAGNOSIS — Z951 Presence of aortocoronary bypass graft: Secondary | ICD-10-CM

## 2020-04-06 DIAGNOSIS — Z4682 Encounter for fitting and adjustment of non-vascular catheter: Secondary | ICD-10-CM

## 2020-04-06 DIAGNOSIS — J939 Pneumothorax, unspecified: Secondary | ICD-10-CM

## 2020-04-06 HISTORY — PX: TEE WITHOUT CARDIOVERSION: SHX5443

## 2020-04-06 HISTORY — PX: RADIAL ARTERY HARVEST: SHX5067

## 2020-04-06 HISTORY — PX: CORONARY ARTERY BYPASS GRAFT: SHX141

## 2020-04-06 LAB — POCT I-STAT 7, (LYTES, BLD GAS, ICA,H+H)
Acid-Base Excess: 0 mmol/L (ref 0.0–2.0)
Acid-Base Excess: 1 mmol/L (ref 0.0–2.0)
Acid-base deficit: 2 mmol/L (ref 0.0–2.0)
Acid-base deficit: 1 mmol/L (ref 0.0–2.0)
Acid-base deficit: 3 mmol/L — ABNORMAL HIGH (ref 0.0–2.0)
Acid-base deficit: 4 mmol/L — ABNORMAL HIGH (ref 0.0–2.0)
Acid-base deficit: 3 mmol/L — ABNORMAL HIGH (ref 0.0–2.0)
Bicarbonate: 23.6 mmol/L (ref 20.0–28.0)
Bicarbonate: 25.9 mmol/L (ref 20.0–28.0)
Bicarbonate: 25.3 mmol/L (ref 20.0–28.0)
Bicarbonate: 23.2 mmol/L (ref 20.0–28.0)
Bicarbonate: 23.4 mmol/L (ref 20.0–28.0)
Bicarbonate: 22.3 mmol/L (ref 20.0–28.0)
Bicarbonate: 23.3 mmol/L (ref 20.0–28.0)
Calcium, Ion: 1.28 mmol/L (ref 1.15–1.40)
Calcium, Ion: 1.07 mmol/L — ABNORMAL LOW (ref 1.15–1.40)
Calcium, Ion: 1.12 mmol/L — ABNORMAL LOW (ref 1.15–1.40)
Calcium, Ion: 1.12 mmol/L — ABNORMAL LOW (ref 1.15–1.40)
Calcium, Ion: 1.17 mmol/L (ref 1.15–1.40)
Calcium, Ion: 1.21 mmol/L (ref 1.15–1.40)
Calcium, Ion: 1.17 mmol/L (ref 1.15–1.40)
HCT: 31 % — ABNORMAL LOW (ref 39.0–52.0)
HCT: 26 % — ABNORMAL LOW (ref 39.0–52.0)
HCT: 26 % — ABNORMAL LOW (ref 39.0–52.0)
HCT: 27 % — ABNORMAL LOW (ref 39.0–52.0)
HCT: 27 % — ABNORMAL LOW (ref 39.0–52.0)
HCT: 27 % — ABNORMAL LOW (ref 39.0–52.0)
HCT: 28 % — ABNORMAL LOW (ref 39.0–52.0)
Hemoglobin: 10.5 g/dL — ABNORMAL LOW (ref 13.0–17.0)
Hemoglobin: 8.8 g/dL — ABNORMAL LOW (ref 13.0–17.0)
Hemoglobin: 8.8 g/dL — ABNORMAL LOW (ref 13.0–17.0)
Hemoglobin: 9.2 g/dL — ABNORMAL LOW (ref 13.0–17.0)
Hemoglobin: 9.2 g/dL — ABNORMAL LOW (ref 13.0–17.0)
Hemoglobin: 9.2 g/dL — ABNORMAL LOW (ref 13.0–17.0)
Hemoglobin: 9.5 g/dL — ABNORMAL LOW (ref 13.0–17.0)
O2 Saturation: 99 %
O2 Saturation: 100 %
O2 Saturation: 100 %
O2 Saturation: 100 %
O2 Saturation: 99 %
O2 Saturation: 97 %
O2 Saturation: 94 %
Patient temperature: 36.5
Patient temperature: 37.4
Patient temperature: 37.6
Potassium: 4.1 mmol/L (ref 3.5–5.1)
Potassium: 4.1 mmol/L (ref 3.5–5.1)
Potassium: 4.2 mmol/L (ref 3.5–5.1)
Potassium: 3.7 mmol/L (ref 3.5–5.1)
Potassium: 3.8 mmol/L (ref 3.5–5.1)
Potassium: 4.8 mmol/L (ref 3.5–5.1)
Potassium: 4.7 mmol/L (ref 3.5–5.1)
Sodium: 141 mmol/L (ref 135–145)
Sodium: 143 mmol/L (ref 135–145)
Sodium: 142 mmol/L (ref 135–145)
Sodium: 145 mmol/L (ref 135–145)
Sodium: 144 mmol/L (ref 135–145)
Sodium: 143 mmol/L (ref 135–145)
Sodium: 143 mmol/L (ref 135–145)
TCO2: 25 mmol/L (ref 22–32)
TCO2: 27 mmol/L (ref 22–32)
TCO2: 27 mmol/L (ref 22–32)
TCO2: 24 mmol/L (ref 22–32)
TCO2: 25 mmol/L (ref 22–32)
TCO2: 24 mmol/L (ref 22–32)
TCO2: 25 mmol/L (ref 22–32)
pCO2 arterial: 42.9 mmHg (ref 32.0–48.0)
pCO2 arterial: 45.4 mmHg (ref 32.0–48.0)
pCO2 arterial: 40.5 mmHg (ref 32.0–48.0)
pCO2 arterial: 37.4 mmHg (ref 32.0–48.0)
pCO2 arterial: 45.4 mmHg (ref 32.0–48.0)
pCO2 arterial: 46.3 mmHg (ref 32.0–48.0)
pCO2 arterial: 46.2 mmHg (ref 32.0–48.0)
pH, Arterial: 7.349 — ABNORMAL LOW (ref 7.350–7.450)
pH, Arterial: 7.365 (ref 7.350–7.450)
pH, Arterial: 7.404 (ref 7.350–7.450)
pH, Arterial: 7.4 (ref 7.350–7.450)
pH, Arterial: 7.318 — ABNORMAL LOW (ref 7.350–7.450)
pH, Arterial: 7.292 — ABNORMAL LOW (ref 7.350–7.450)
pH, Arterial: 7.315 — ABNORMAL LOW (ref 7.350–7.450)
pO2, Arterial: 169 mmHg — ABNORMAL HIGH (ref 83.0–108.0)
pO2, Arterial: 336 mmHg — ABNORMAL HIGH (ref 83.0–108.0)
pO2, Arterial: 353 mmHg — ABNORMAL HIGH (ref 83.0–108.0)
pO2, Arterial: 199 mmHg — ABNORMAL HIGH (ref 83.0–108.0)
pO2, Arterial: 152 mmHg — ABNORMAL HIGH (ref 83.0–108.0)
pO2, Arterial: 104 mmHg (ref 83.0–108.0)
pO2, Arterial: 82 mmHg — ABNORMAL LOW (ref 83.0–108.0)

## 2020-04-06 LAB — POCT I-STAT, CHEM 8
BUN: 10 mg/dL (ref 8–23)
BUN: 8 mg/dL (ref 8–23)
BUN: 8 mg/dL (ref 8–23)
BUN: 8 mg/dL (ref 8–23)
BUN: 7 mg/dL — ABNORMAL LOW (ref 8–23)
BUN: 6 mg/dL — ABNORMAL LOW (ref 8–23)
Calcium, Ion: 1.29 mmol/L (ref 1.15–1.40)
Calcium, Ion: 1.27 mmol/L (ref 1.15–1.40)
Calcium, Ion: 1.11 mmol/L — ABNORMAL LOW (ref 1.15–1.40)
Calcium, Ion: 1.28 mmol/L (ref 1.15–1.40)
Calcium, Ion: 1.09 mmol/L — ABNORMAL LOW (ref 1.15–1.40)
Calcium, Ion: 1.14 mmol/L — ABNORMAL LOW (ref 1.15–1.40)
Chloride: 106 mmol/L (ref 98–111)
Chloride: 106 mmol/L (ref 98–111)
Chloride: 105 mmol/L (ref 98–111)
Chloride: 107 mmol/L (ref 98–111)
Chloride: 106 mmol/L (ref 98–111)
Chloride: 106 mmol/L (ref 98–111)
Creatinine, Ser: 0.6 mg/dL — ABNORMAL LOW (ref 0.61–1.24)
Creatinine, Ser: 0.5 mg/dL — ABNORMAL LOW (ref 0.61–1.24)
Creatinine, Ser: 0.4 mg/dL — ABNORMAL LOW (ref 0.61–1.24)
Creatinine, Ser: 0.5 mg/dL — ABNORMAL LOW (ref 0.61–1.24)
Creatinine, Ser: 0.5 mg/dL — ABNORMAL LOW (ref 0.61–1.24)
Creatinine, Ser: 0.5 mg/dL — ABNORMAL LOW (ref 0.61–1.24)
Glucose, Bld: 203 mg/dL — ABNORMAL HIGH (ref 70–99)
Glucose, Bld: 159 mg/dL — ABNORMAL HIGH (ref 70–99)
Glucose, Bld: 118 mg/dL — ABNORMAL HIGH (ref 70–99)
Glucose, Bld: 155 mg/dL — ABNORMAL HIGH (ref 70–99)
Glucose, Bld: 123 mg/dL — ABNORMAL HIGH (ref 70–99)
Glucose, Bld: 125 mg/dL — ABNORMAL HIGH (ref 70–99)
HCT: 33 % — ABNORMAL LOW (ref 39.0–52.0)
HCT: 30 % — ABNORMAL LOW (ref 39.0–52.0)
HCT: 24 % — ABNORMAL LOW (ref 39.0–52.0)
HCT: 33 % — ABNORMAL LOW (ref 39.0–52.0)
HCT: 24 % — ABNORMAL LOW (ref 39.0–52.0)
HCT: 24 % — ABNORMAL LOW (ref 39.0–52.0)
Hemoglobin: 11.2 g/dL — ABNORMAL LOW (ref 13.0–17.0)
Hemoglobin: 10.2 g/dL — ABNORMAL LOW (ref 13.0–17.0)
Hemoglobin: 11.2 g/dL — ABNORMAL LOW (ref 13.0–17.0)
Hemoglobin: 8.2 g/dL — ABNORMAL LOW (ref 13.0–17.0)
Hemoglobin: 8.2 g/dL — ABNORMAL LOW (ref 13.0–17.0)
Hemoglobin: 8.2 g/dL — ABNORMAL LOW (ref 13.0–17.0)
Potassium: 4.3 mmol/L (ref 3.5–5.1)
Potassium: 4 mmol/L (ref 3.5–5.1)
Potassium: 4 mmol/L (ref 3.5–5.1)
Potassium: 4.7 mmol/L (ref 3.5–5.1)
Potassium: 4.1 mmol/L (ref 3.5–5.1)
Potassium: 3.7 mmol/L (ref 3.5–5.1)
Sodium: 142 mmol/L (ref 135–145)
Sodium: 140 mmol/L (ref 135–145)
Sodium: 140 mmol/L (ref 135–145)
Sodium: 142 mmol/L (ref 135–145)
Sodium: 141 mmol/L (ref 135–145)
Sodium: 144 mmol/L (ref 135–145)
TCO2: 23 mmol/L (ref 22–32)
TCO2: 22 mmol/L (ref 22–32)
TCO2: 21 mmol/L — ABNORMAL LOW (ref 22–32)
TCO2: 25 mmol/L (ref 22–32)
TCO2: 26 mmol/L (ref 22–32)
TCO2: 23 mmol/L (ref 22–32)

## 2020-04-06 LAB — BASIC METABOLIC PANEL
Anion gap: 7 (ref 5–15)
BUN: 7 mg/dL — ABNORMAL LOW (ref 8–23)
CO2: 23 mmol/L (ref 22–32)
Calcium: 8.2 mg/dL — ABNORMAL LOW (ref 8.9–10.3)
Chloride: 111 mmol/L (ref 98–111)
Creatinine, Ser: 0.72 mg/dL (ref 0.61–1.24)
GFR calc Af Amer: >60 mL/min (ref >60)
GFR calc non Af Amer: >60 mL/min (ref >60)
Glucose, Bld: 114 mg/dL — ABNORMAL HIGH (ref 70–99)
Potassium: 4.2 mmol/L (ref 3.5–5.1)
Sodium: 141 mmol/L (ref 135–145)

## 2020-04-06 LAB — CBC
HCT: 28.5 % — ABNORMAL LOW (ref 39.0–52.0)
HCT: 30.6 % — ABNORMAL LOW (ref 39.0–52.0)
Hemoglobin: 9.7 g/dL — ABNORMAL LOW (ref 13.0–17.0)
Hemoglobin: 10.1 g/dL — ABNORMAL LOW (ref 13.0–17.0)
MCH: 32.3 pg (ref 26.0–34.0)
MCH: 31.1 pg (ref 26.0–34.0)
MCHC: 34 g/dL (ref 30.0–36.0)
MCHC: 33 g/dL (ref 30.0–36.0)
MCV: 95 fL (ref 80.0–100.0)
MCV: 94.2 fL (ref 80.0–100.0)
Platelets: 127 10*3/uL — ABNORMAL LOW (ref 150–400)
Platelets: 141 10*3/uL — ABNORMAL LOW (ref 150–400)
RBC: 3 MIL/uL — ABNORMAL LOW (ref 4.22–5.81)
RBC: 3.25 MIL/uL — ABNORMAL LOW (ref 4.22–5.81)
RDW: 11.6 % (ref 11.5–15.5)
RDW: 11.8 % (ref 11.5–15.5)
WBC: 9.6 10*3/uL (ref 4.0–10.5)
WBC: 9.9 10*3/uL (ref 4.0–10.5)
nRBC: 0 % (ref 0.0–0.2)
nRBC: 0 % (ref 0.0–0.2)

## 2020-04-06 LAB — HEMOGLOBIN AND HEMATOCRIT, BLOOD
HCT: 24.4 % — ABNORMAL LOW (ref 39.0–52.0)
Hemoglobin: 8.3 g/dL — ABNORMAL LOW (ref 13.0–17.0)

## 2020-04-06 LAB — ECHO INTRAOPERATIVE TEE
AV Mean grad: 8.5 mmHg
AV Peak grad: 15.7 mmHg
Ao pk vel: 1.98 m/s
Height: 69 in
Weight: 3118.4 oz

## 2020-04-06 LAB — APTT: aPTT: 35 seconds (ref 24–36)

## 2020-04-06 LAB — GLUCOSE, CAPILLARY
Glucose-Capillary: 194 mg/dL — ABNORMAL HIGH (ref 70–99)
Glucose-Capillary: 99 mg/dL (ref 70–99)
Glucose-Capillary: 100 mg/dL — ABNORMAL HIGH (ref 70–99)
Glucose-Capillary: 100 mg/dL — ABNORMAL HIGH (ref 70–99)
Glucose-Capillary: 103 mg/dL — ABNORMAL HIGH (ref 70–99)
Glucose-Capillary: 110 mg/dL — ABNORMAL HIGH (ref 70–99)
Glucose-Capillary: 115 mg/dL — ABNORMAL HIGH (ref 70–99)
Glucose-Capillary: 125 mg/dL — ABNORMAL HIGH (ref 70–99)
Glucose-Capillary: 153 mg/dL — ABNORMAL HIGH (ref 70–99)

## 2020-04-06 LAB — PLATELET COUNT: Platelets: 136 10*3/uL — ABNORMAL LOW (ref 150–400)

## 2020-04-06 LAB — MAGNESIUM: Magnesium: 2.5 mg/dL — ABNORMAL HIGH (ref 1.7–2.4)

## 2020-04-06 LAB — ABO/RH: ABO/RH(D): A POS

## 2020-04-06 LAB — PROTIME-INR
INR: 1.4 — ABNORMAL HIGH (ref 0.8–1.2)
Prothrombin Time: 16.8 seconds — ABNORMAL HIGH (ref 11.4–15.2)

## 2020-04-06 SURGERY — CORONARY ARTERY BYPASS GRAFTING (CABG)
Anesthesia: General | Site: Chest

## 2020-04-06 MED ORDER — VANCOMYCIN HCL 1000 MG IV SOLR
INTRAVENOUS | Status: AC
  Filled 2020-04-06: qty 3000

## 2020-04-06 MED ORDER — MIDAZOLAM HCL (PF) 10 MG/2ML IJ SOLN
INTRAMUSCULAR | Status: AC
  Filled 2020-04-06: qty 2

## 2020-04-06 MED ORDER — MIDAZOLAM HCL 5 MG/5ML IJ SOLN
INTRAMUSCULAR | Status: DC | PRN
  Administered 2020-04-06: 5 mg via INTRAVENOUS
  Administered 2020-04-06: 3 mg via INTRAVENOUS
  Administered 2020-04-06: 5 mg via INTRAVENOUS
  Administered 2020-04-06: 2 mg via INTRAVENOUS
  Administered 2020-04-06: 5 mg via INTRAVENOUS

## 2020-04-06 MED ORDER — ROSUVASTATIN CALCIUM 20 MG PO TABS
40.0000 mg | ORAL_TABLET | Freq: Every day | ORAL | Status: DC
  Administered 2020-04-07 – 2020-04-11 (×5): 40 mg via ORAL
  Filled 2020-04-06 (×5): qty 2

## 2020-04-06 MED ORDER — LACTATED RINGERS IV SOLN: INTRAVENOUS | Status: DC | PRN

## 2020-04-06 MED ORDER — CHLORHEXIDINE GLUCONATE 0.12 % MT SOLN
15.0000 mL | Freq: Two times a day (BID) | OROMUCOSAL | Status: DC
  Administered 2020-04-06 – 2020-04-10 (×7): 15 mL via OROMUCOSAL
  Filled 2020-04-06 (×7): qty 15

## 2020-04-06 MED ORDER — SODIUM CHLORIDE 0.9% FLUSH: 10.0000 mL | INTRAVENOUS | Status: DC | PRN

## 2020-04-06 MED ORDER — ROCURONIUM BROMIDE 10 MG/ML (PF) SYRINGE
PREFILLED_SYRINGE | INTRAVENOUS | Status: AC
  Filled 2020-04-06: qty 10

## 2020-04-06 MED ORDER — BISACODYL 10 MG RE SUPP: 10.0000 mg | Freq: Every day | RECTAL | Status: DC

## 2020-04-06 MED ORDER — SODIUM CHLORIDE 0.9 % IV SOLN: INTRAVENOUS | Status: DC

## 2020-04-06 MED ORDER — SODIUM CHLORIDE 0.45 % IV SOLN: INTRAVENOUS | Status: DC | PRN

## 2020-04-06 MED ORDER — FAMOTIDINE IN NACL 20-0.9 MG/50ML-% IV SOLN
20.0000 mg | Freq: Two times a day (BID) | INTRAVENOUS | Status: AC
  Administered 2020-04-06 (×2): 20 mg via INTRAVENOUS
  Filled 2020-04-06: qty 50

## 2020-04-06 MED ORDER — BUPIVACAINE LIPOSOME 1.3 % IJ SUSP
INTRAMUSCULAR | Status: DC | PRN
  Administered 2020-04-06: 50 mL

## 2020-04-06 MED ORDER — PHENYLEPHRINE HCL-NACL 20-0.9 MG/250ML-% IV SOLN
INTRAVENOUS | Status: DC | PRN
  Administered 2020-04-06: 15 ug/min via INTRAVENOUS

## 2020-04-06 MED ORDER — CHLORHEXIDINE GLUCONATE 0.12 % MT SOLN
15.0000 mL | OROMUCOSAL | Status: AC
  Administered 2020-04-06: 15 mL via OROMUCOSAL

## 2020-04-06 MED ORDER — NITROGLYCERIN IN D5W 200-5 MCG/ML-% IV SOLN
7.0000 ug/min | INTRAVENOUS | Status: DC
  Administered 2020-04-06: 7 ug/min via INTRAVENOUS

## 2020-04-06 MED ORDER — SODIUM CHLORIDE 0.9% FLUSH: 3.0000 mL | INTRAVENOUS | Status: DC | PRN

## 2020-04-06 MED ORDER — ACETAMINOPHEN 500 MG PO TABS: 1000.0000 mg | ORAL_TABLET | Freq: Once | ORAL | Status: AC

## 2020-04-06 MED ORDER — SODIUM CHLORIDE 0.9 % IV SOLN: INTRAVENOUS | Status: DC | PRN

## 2020-04-06 MED ORDER — METOPROLOL TARTRATE 12.5 MG HALF TABLET: 12.5000 mg | ORAL_TABLET | Freq: Once | ORAL | Status: DC

## 2020-04-06 MED ORDER — TRAZODONE 25 MG HALF TABLET
25.0000 mg | ORAL_TABLET | Freq: Every evening | ORAL | Status: DC | PRN
  Filled 2020-04-06: qty 2

## 2020-04-06 MED ORDER — PHENYLEPHRINE HCL-NACL 10-0.9 MG/250ML-% IV SOLN
INTRAVENOUS | Status: DC | PRN
  Administered 2020-04-06: 20 ug/min via INTRAVENOUS

## 2020-04-06 MED ORDER — MIDAZOLAM HCL 2 MG/2ML IJ SOLN: 2.0000 mg | INTRAMUSCULAR | Status: DC | PRN

## 2020-04-06 MED ORDER — SODIUM BICARBONATE 8.4 % IV SOLN
50.0000 meq | Freq: Once | INTRAVENOUS | Status: AC
  Administered 2020-04-06: 50 meq via INTRAVENOUS

## 2020-04-06 MED ORDER — VANCOMYCIN HCL IN DEXTROSE 1-5 GM/200ML-% IV SOLN
1000.0000 mg | Freq: Once | INTRAVENOUS | Status: AC
  Administered 2020-04-06: 1000 mg via INTRAVENOUS
  Filled 2020-04-06: qty 200

## 2020-04-06 MED ORDER — FENTANYL CITRATE (PF) 250 MCG/5ML IJ SOLN
INTRAMUSCULAR | Status: AC
  Filled 2020-04-06: qty 20

## 2020-04-06 MED ORDER — ACETAMINOPHEN 650 MG RE SUPP
650.0000 mg | Freq: Once | RECTAL | Status: AC
  Administered 2020-04-06: 650 mg via RECTAL

## 2020-04-06 MED ORDER — METOPROLOL TARTRATE 25 MG/10 ML ORAL SUSPENSION: 12.5000 mg | Freq: Two times a day (BID) | ORAL | Status: DC

## 2020-04-06 MED ORDER — ACETAMINOPHEN 160 MG/5ML PO SOLN: 1000.0000 mg | Freq: Four times a day (QID) | ORAL | Status: DC

## 2020-04-06 MED ORDER — VANCOMYCIN HCL 1000 MG IV SOLR
INTRAVENOUS | Status: DC | PRN
  Administered 2020-04-06: 3 g via TOPICAL

## 2020-04-06 MED ORDER — PLASMA-LYTE 148 IV SOLN
INTRAVENOUS | Status: DC | PRN
  Administered 2020-04-06: 500 mL

## 2020-04-06 MED ORDER — METOPROLOL TARTRATE 12.5 MG HALF TABLET
ORAL_TABLET | ORAL | Status: AC
  Filled 2020-04-06: qty 1

## 2020-04-06 MED ORDER — PHENYLEPHRINE 40 MCG/ML (10ML) SYRINGE FOR IV PUSH (FOR BLOOD PRESSURE SUPPORT)
PREFILLED_SYRINGE | INTRAVENOUS | Status: DC | PRN
  Administered 2020-04-06 (×2): 80 ug via INTRAVENOUS
  Administered 2020-04-06: 40 ug via INTRAVENOUS
  Administered 2020-04-06: 20 ug via INTRAVENOUS
  Administered 2020-04-06: 40 ug via INTRAVENOUS
  Administered 2020-04-06: 160 ug via INTRAVENOUS

## 2020-04-06 MED ORDER — TRAMADOL HCL 50 MG PO TABS
50.0000 mg | ORAL_TABLET | ORAL | Status: DC | PRN
  Administered 2020-04-07 – 2020-04-08 (×3): 50 mg via ORAL
  Filled 2020-04-06 (×3): qty 1

## 2020-04-06 MED ORDER — SODIUM CHLORIDE (PF) 0.9 % IJ SOLN
OROMUCOSAL | Status: DC | PRN
  Administered 2020-04-06 (×3): 4 mL via TOPICAL

## 2020-04-06 MED ORDER — ROCURONIUM BROMIDE 10 MG/ML (PF) SYRINGE
PREFILLED_SYRINGE | INTRAVENOUS | Status: DC | PRN
  Administered 2020-04-06: 50 mg via INTRAVENOUS
  Administered 2020-04-06: 100 mg via INTRAVENOUS
  Administered 2020-04-06 (×2): 50 mg via INTRAVENOUS

## 2020-04-06 MED ORDER — HEPARIN SODIUM (PORCINE) 1000 UNIT/ML IJ SOLN
INTRAMUSCULAR | Status: DC | PRN
  Administered 2020-04-06: 30000 [IU] via INTRAVENOUS

## 2020-04-06 MED ORDER — BUPIVACAINE HCL (PF) 0.5 % IJ SOLN
INTRAMUSCULAR | Status: AC
  Filled 2020-04-06: qty 30

## 2020-04-06 MED ORDER — PROTAMINE SULFATE 10 MG/ML IV SOLN
INTRAVENOUS | Status: AC
  Filled 2020-04-06: qty 5

## 2020-04-06 MED ORDER — CHLORHEXIDINE GLUCONATE 4 % EX LIQD: 30.0000 mL | CUTANEOUS | Status: DC

## 2020-04-06 MED ORDER — ADULT MULTIVITAMIN W/MINERALS CH
1.0000 | ORAL_TABLET | Freq: Every day | ORAL | Status: DC
  Administered 2020-04-07 – 2020-04-11 (×5): 1 via ORAL
  Filled 2020-04-06 (×5): qty 1

## 2020-04-06 MED ORDER — PROPOFOL 10 MG/ML IV BOLUS
INTRAVENOUS | Status: AC
  Filled 2020-04-06: qty 20

## 2020-04-06 MED ORDER — ACETAMINOPHEN 500 MG PO TABS
ORAL_TABLET | ORAL | Status: AC
  Administered 2020-04-06: 1000 mg via ORAL
  Filled 2020-04-06: qty 2

## 2020-04-06 MED ORDER — NON FORMULARY
Status: DC | PRN
  Administered 2020-04-06: 5 mL

## 2020-04-06 MED ORDER — BUPIVACAINE LIPOSOME 1.3 % IJ SUSP
20.0000 mL | Freq: Once | INTRAMUSCULAR | Status: DC
  Filled 2020-04-06: qty 20

## 2020-04-06 MED ORDER — DOCUSATE SODIUM 100 MG PO CAPS
200.0000 mg | ORAL_CAPSULE | Freq: Every day | ORAL | Status: DC
  Administered 2020-04-07 – 2020-04-08 (×2): 200 mg via ORAL
  Filled 2020-04-06 (×2): qty 2

## 2020-04-06 MED ORDER — DEXMEDETOMIDINE HCL IN NACL 400 MCG/100ML IV SOLN
0.0000 ug/kg/h | INTRAVENOUS | Status: DC
  Administered 2020-04-06: 0.5 ug/kg/h via INTRAVENOUS
  Administered 2020-04-06: 0.7 ug/kg/h via INTRAVENOUS

## 2020-04-06 MED ORDER — ASPIRIN 81 MG PO CHEW: 324.0000 mg | CHEWABLE_TABLET | Freq: Every day | ORAL | Status: DC

## 2020-04-06 MED ORDER — DEXTROSE 50 % IV SOLN: 0.0000 mL | INTRAVENOUS | Status: DC | PRN

## 2020-04-06 MED ORDER — PANTOPRAZOLE SODIUM 40 MG PO TBEC
40.0000 mg | DELAYED_RELEASE_TABLET | Freq: Every day | ORAL | Status: DC
  Administered 2020-04-08: 40 mg via ORAL
  Filled 2020-04-06 (×2): qty 1

## 2020-04-06 MED ORDER — LACTATED RINGERS IV SOLN: INTRAVENOUS | Status: DC

## 2020-04-06 MED ORDER — OXYCODONE HCL 5 MG PO TABS
5.0000 mg | ORAL_TABLET | ORAL | Status: DC | PRN
  Administered 2020-04-06: 10 mg via ORAL
  Administered 2020-04-07 (×2): 5 mg via ORAL
  Filled 2020-04-06 (×2): qty 1

## 2020-04-06 MED ORDER — HEMOSTATIC AGENTS (NO CHARGE) OPTIME
TOPICAL | Status: DC | PRN
  Administered 2020-04-06 (×3): 1 via TOPICAL

## 2020-04-06 MED ORDER — MAGNESIUM SULFATE 4 GM/100ML IV SOLN
4.0000 g | Freq: Once | INTRAVENOUS | Status: AC
  Administered 2020-04-06: 4 g via INTRAVENOUS

## 2020-04-06 MED ORDER — STERILE WATER FOR INJECTION IJ SOLN
INTRAMUSCULAR | Status: AC
  Filled 2020-04-06: qty 10

## 2020-04-06 MED ORDER — POTASSIUM CHLORIDE 10 MEQ/50ML IV SOLN
10.0000 meq | INTRAVENOUS | Status: AC
  Administered 2020-04-06 (×3): 10 meq via INTRAVENOUS

## 2020-04-06 MED ORDER — INSULIN REGULAR(HUMAN) IN NACL 100-0.9 UT/100ML-% IV SOLN
INTRAVENOUS | Status: DC
  Administered 2020-04-06: 1 [IU]/h via INTRAVENOUS
  Administered 2020-04-07: 4.4 [IU]/h via INTRAVENOUS
  Filled 2020-04-06: qty 100

## 2020-04-06 MED ORDER — METOPROLOL TARTRATE 5 MG/5ML IV SOLN: 2.5000 mg | INTRAVENOUS | Status: DC | PRN

## 2020-04-06 MED ORDER — BISACODYL 5 MG PO TBEC
10.0000 mg | DELAYED_RELEASE_TABLET | Freq: Every day | ORAL | Status: DC
  Administered 2020-04-07 – 2020-04-08 (×2): 10 mg via ORAL
  Filled 2020-04-06 (×2): qty 2

## 2020-04-06 MED ORDER — CHLORHEXIDINE GLUCONATE 0.12 % MT SOLN
OROMUCOSAL | Status: AC
  Administered 2020-04-06: 15 mL via OROMUCOSAL
  Filled 2020-04-06: qty 15

## 2020-04-06 MED ORDER — LIDOCAINE 2% (20 MG/ML) 5 ML SYRINGE
INTRAMUSCULAR | Status: AC
  Filled 2020-04-06: qty 5

## 2020-04-06 MED ORDER — ISOSORBIDE DINITRATE 10 MG PO TABS
10.0000 mg | ORAL_TABLET | Freq: Three times a day (TID) | ORAL | Status: DC
  Administered 2020-04-07 – 2020-04-11 (×13): 10 mg via ORAL
  Filled 2020-04-06 (×13): qty 1

## 2020-04-06 MED ORDER — FENTANYL CITRATE (PF) 250 MCG/5ML IJ SOLN
INTRAMUSCULAR | Status: DC | PRN
  Administered 2020-04-06: 200 ug via INTRAVENOUS
  Administered 2020-04-06 (×2): 150 ug via INTRAVENOUS
  Administered 2020-04-06: 50 ug via INTRAVENOUS
  Administered 2020-04-06: 100 ug via INTRAVENOUS
  Administered 2020-04-06: 250 ug via INTRAVENOUS
  Administered 2020-04-06: 350 ug via INTRAVENOUS

## 2020-04-06 MED ORDER — CHLORHEXIDINE GLUCONATE CLOTH 2 % EX PADS
6.0000 | MEDICATED_PAD | Freq: Every day | CUTANEOUS | Status: DC
  Administered 2020-04-06 – 2020-04-08 (×3): 6 via TOPICAL

## 2020-04-06 MED ORDER — FENTANYL CITRATE (PF) 250 MCG/5ML IJ SOLN
INTRAMUSCULAR | Status: AC
  Filled 2020-04-06: qty 5

## 2020-04-06 MED ORDER — CHLORHEXIDINE GLUCONATE 0.12 % MT SOLN: 15.0000 mL | Freq: Once | OROMUCOSAL | Status: AC

## 2020-04-06 MED ORDER — METOPROLOL TARTRATE 12.5 MG HALF TABLET: 12.5000 mg | ORAL_TABLET | Freq: Two times a day (BID) | ORAL | Status: DC

## 2020-04-06 MED ORDER — PROTAMINE SULFATE 10 MG/ML IV SOLN
INTRAVENOUS | Status: DC | PRN
  Administered 2020-04-06: 290 mg via INTRAVENOUS
  Administered 2020-04-06: 10 mg via INTRAVENOUS

## 2020-04-06 MED ORDER — ASPIRIN EC 325 MG PO TBEC
325.0000 mg | DELAYED_RELEASE_TABLET | Freq: Every day | ORAL | Status: DC
  Administered 2020-04-07 – 2020-04-08 (×2): 325 mg via ORAL
  Filled 2020-04-06 (×2): qty 1

## 2020-04-06 MED ORDER — ALBUMIN HUMAN 5 % IV SOLN: INTRAVENOUS | Status: DC | PRN

## 2020-04-06 MED ORDER — 0.9 % SODIUM CHLORIDE (POUR BTL) OPTIME
TOPICAL | Status: DC | PRN
  Administered 2020-04-06: 5000 mL

## 2020-04-06 MED ORDER — SODIUM CHLORIDE 0.9 % IV SOLN: 250.0000 mL | INTRAVENOUS | Status: DC

## 2020-04-06 MED ORDER — PHENYLEPHRINE 40 MCG/ML (10ML) SYRINGE FOR IV PUSH (FOR BLOOD PRESSURE SUPPORT)
PREFILLED_SYRINGE | INTRAVENOUS | Status: AC
  Filled 2020-04-06: qty 10

## 2020-04-06 MED ORDER — HEPARIN SODIUM (PORCINE) 1000 UNIT/ML IJ SOLN
INTRAMUSCULAR | Status: AC
  Filled 2020-04-06: qty 1

## 2020-04-06 MED ORDER — ONDANSETRON HCL 4 MG/2ML IJ SOLN
4.0000 mg | Freq: Four times a day (QID) | INTRAMUSCULAR | Status: DC | PRN
  Administered 2020-04-06 – 2020-04-08 (×2): 4 mg via INTRAVENOUS
  Filled 2020-04-06 (×2): qty 2

## 2020-04-06 MED ORDER — MORPHINE SULFATE (PF) 2 MG/ML IV SOLN
1.0000 mg | INTRAVENOUS | Status: DC | PRN
  Administered 2020-04-06 (×2): 2 mg via INTRAVENOUS
  Filled 2020-04-06: qty 2

## 2020-04-06 MED ORDER — ORAL CARE MOUTH RINSE
15.0000 mL | Freq: Two times a day (BID) | OROMUCOSAL | Status: DC
  Administered 2020-04-06 – 2020-04-10 (×6): 15 mL via OROMUCOSAL

## 2020-04-06 MED ORDER — SODIUM CHLORIDE 0.9% FLUSH
10.0000 mL | Freq: Two times a day (BID) | INTRAVENOUS | Status: DC
  Administered 2020-04-06 – 2020-04-08 (×3): 10 mL

## 2020-04-06 MED ORDER — ACETAMINOPHEN 500 MG PO TABS
1000.0000 mg | ORAL_TABLET | Freq: Four times a day (QID) | ORAL | Status: DC
  Administered 2020-04-06 – 2020-04-08 (×7): 1000 mg via ORAL
  Filled 2020-04-06 (×7): qty 2

## 2020-04-06 MED ORDER — SODIUM CHLORIDE 0.9% FLUSH
3.0000 mL | Freq: Two times a day (BID) | INTRAVENOUS | Status: DC
  Administered 2020-04-07 – 2020-04-08 (×2): 3 mL via INTRAVENOUS

## 2020-04-06 MED ORDER — ALBUMIN HUMAN 5 % IV SOLN
250.0000 mL | INTRAVENOUS | Status: DC | PRN
  Administered 2020-04-06 (×2): 12.5 g via INTRAVENOUS

## 2020-04-06 MED ORDER — ACETAMINOPHEN 160 MG/5ML PO SOLN: 650.0000 mg | Freq: Once | ORAL | Status: AC

## 2020-04-06 MED ORDER — SODIUM CHLORIDE 0.9 % IV SOLN
1.5000 g | Freq: Two times a day (BID) | INTRAVENOUS | Status: AC
  Administered 2020-04-06 – 2020-04-08 (×4): 1.5 g via INTRAVENOUS
  Filled 2020-04-06 (×4): qty 1.5

## 2020-04-06 MED ORDER — PROPOFOL 10 MG/ML IV BOLUS
INTRAVENOUS | Status: DC | PRN
  Administered 2020-04-06: 40 mg via INTRAVENOUS

## 2020-04-06 MED ORDER — LACTATED RINGERS IV SOLN: 500.0000 mL | Freq: Once | INTRAVENOUS | Status: DC | PRN

## 2020-04-06 MED ORDER — PHENYLEPHRINE HCL-NACL 20-0.9 MG/250ML-% IV SOLN
0.0000 ug/min | INTRAVENOUS | Status: DC
  Administered 2020-04-06: 15 ug/min via INTRAVENOUS
  Administered 2020-04-07: 10 ug/min via INTRAVENOUS
  Filled 2020-04-06 (×2): qty 250

## 2020-04-06 MED FILL — Thrombin For Soln 5000 Unit: CUTANEOUS | Qty: 5000 | Status: AC

## 2020-04-06 MED FILL — Calcium Chloride Inj 10%: INTRAVENOUS | Qty: 5 | Status: AC

## 2020-04-06 SURGICAL SUPPLY — 123 items
ADAPTER CARDIO PERF ANTE/RETRO (ADAPTER) ×4 IMPLANT
APPLICATOR TIP COSEAL (VASCULAR PRODUCTS) ×4 IMPLANT
APPLIER CLIP 9.375 SM OPEN (CLIP) ×4
BAG DECANTER FOR FLEXI CONT (MISCELLANEOUS) ×4 IMPLANT
BATTERY MAXDRIVER (MISCELLANEOUS) ×4 IMPLANT
BENZOIN TINCTURE PRP APPL 2/3 (GAUZE/BANDAGES/DRESSINGS) ×4 IMPLANT
BLADE CLIPPER SURG (BLADE) ×4 IMPLANT
BLADE STERNUM SYSTEM 6 (BLADE) ×4 IMPLANT
BLADE SURG 15 STRL LF DISP TIS (BLADE) ×3 IMPLANT
BLADE SURG 15 STRL SS (BLADE) ×1
BNDG ELASTIC 4X5.8 VLCR STR LF (GAUZE/BANDAGES/DRESSINGS) ×4 IMPLANT
BNDG ELASTIC 6X5.8 VLCR STR LF (GAUZE/BANDAGES/DRESSINGS) ×4 IMPLANT
BNDG GAUZE ELAST 4 BULKY (GAUZE/BANDAGES/DRESSINGS) ×4 IMPLANT
CANISTER SUCT 3000ML PPV (MISCELLANEOUS) ×4 IMPLANT
CANISTER WOUND CARE 500ML ATS (WOUND CARE) ×4 IMPLANT
CANNULA NON VENT 22FR 12 (CANNULA) ×4 IMPLANT
CATH CPB KIT HENDRICKSON (MISCELLANEOUS) ×4 IMPLANT
CATH ROBINSON RED A/P 18FR (CATHETERS) ×8 IMPLANT
CLIP APPLIE 9.375 SM OPEN (CLIP) ×3 IMPLANT
CLIP FOGARTY SPRING 6M (CLIP) ×4 IMPLANT
CLIP RETRACTION 3.0MM CORONARY (MISCELLANEOUS) ×4 IMPLANT
CLIP VESOCCLUDE MED 24/CT (CLIP) IMPLANT
CLIP VESOCCLUDE SM WIDE 24/CT (CLIP) IMPLANT
CONN ST 1/4X3/8  BEN (MISCELLANEOUS) ×3
CONN ST 1/4X3/8 BEN (MISCELLANEOUS) ×9 IMPLANT
CONN Y 3/8X3/8X3/8  BEN (MISCELLANEOUS) ×1
CONN Y 3/8X3/8X3/8 BEN (MISCELLANEOUS) ×3 IMPLANT
COVER MAYO STAND STRL (DRAPES) ×4 IMPLANT
CUFF TOURN SGL QUICK 18X4 (TOURNIQUET CUFF) IMPLANT
CUFF TOURN SGL QUICK 24 (TOURNIQUET CUFF)
CUFF TRNQT CYL 24X4X16.5-23 (TOURNIQUET CUFF) IMPLANT
DERMABOND ADVANCED (GAUZE/BANDAGES/DRESSINGS) ×1
DERMABOND ADVANCED .7 DNX12 (GAUZE/BANDAGES/DRESSINGS) ×3 IMPLANT
DRAIN CHANNEL 28F RND 3/8 FF (WOUND CARE) ×16 IMPLANT
DRAPE CARDIOVASCULAR INCISE (DRAPES) ×1
DRAPE EXTREMITY T 121X128X90 (DISPOSABLE) ×4 IMPLANT
DRAPE HALF SHEET 40X57 (DRAPES) ×4 IMPLANT
DRAPE SLUSH/WARMER DISC (DRAPES) ×4 IMPLANT
DRAPE SRG 135X102X78XABS (DRAPES) ×3 IMPLANT
DRESSING PEEL AND PLAC PRVNA20 (GAUZE/BANDAGES/DRESSINGS) ×3 IMPLANT
DRSG AQUACEL AG ADV 3.5X14 (GAUZE/BANDAGES/DRESSINGS) ×4 IMPLANT
DRSG PEEL AND PLACE PREVENA 20 (GAUZE/BANDAGES/DRESSINGS) ×4
ELECT BLADE 4.0 EZ CLEAN MEGAD (MISCELLANEOUS) ×4
ELECT CAUTERY BLADE 6.4 (BLADE) ×4 IMPLANT
ELECT REM PT RETURN 9FT ADLT (ELECTROSURGICAL) ×8
ELECTRODE BLDE 4.0 EZ CLN MEGD (MISCELLANEOUS) ×3 IMPLANT
ELECTRODE REM PT RTRN 9FT ADLT (ELECTROSURGICAL) ×6 IMPLANT
FELT TEFLON 1X6 (MISCELLANEOUS) ×4 IMPLANT
GAUZE SPONGE 4X4 12PLY STRL (GAUZE/BANDAGES/DRESSINGS) ×8 IMPLANT
GAUZE SPONGE 4X4 12PLY STRL LF (GAUZE/BANDAGES/DRESSINGS) ×8 IMPLANT
GEL ULTRASOUND 20GR AQUASONIC (MISCELLANEOUS) ×4 IMPLANT
GLOVE BIO SURGEON STRL SZ 6.5 (GLOVE) ×20 IMPLANT
GLOVE NEODERM STRL 7.5 LF PF (GLOVE) ×9 IMPLANT
GLOVE SURG NEODERM 7.5  LF PF (GLOVE) ×3
GLOVE SURG SYN 7.5  E (GLOVE) ×2
GLOVE SURG SYN 7.5 E (GLOVE) ×6 IMPLANT
GOWN STRL REUS W/ TWL LRG LVL3 (GOWN DISPOSABLE) ×12 IMPLANT
GOWN STRL REUS W/TWL LRG LVL3 (GOWN DISPOSABLE) ×4
HEMOSTAT POWDER SURGIFOAM 1G (HEMOSTASIS) ×12 IMPLANT
INSERT FOGARTY XLG (MISCELLANEOUS) ×4 IMPLANT
INSERT SUTURE HOLDER (MISCELLANEOUS) ×4 IMPLANT
KIT APPLICATOR RATIO 11:1 (KITS) ×4 IMPLANT
KIT BASIN OR (CUSTOM PROCEDURE TRAY) ×4 IMPLANT
KIT SUCTION CATH 14FR (SUCTIONS) ×4 IMPLANT
KIT TURNOVER KIT B (KITS) ×4 IMPLANT
KIT VASOVIEW HEMOPRO 2 VH 4000 (KITS) IMPLANT
MARKER GRAFT CORONARY BYPASS (MISCELLANEOUS) ×12 IMPLANT
NEEDLE 18GX1X1/2 (RX/OR ONLY) (NEEDLE) ×4 IMPLANT
NS IRRIG 1000ML POUR BTL (IV SOLUTION) ×20 IMPLANT
PACK E OPEN HEART (SUTURE) ×4 IMPLANT
PACK OPEN HEART (CUSTOM PROCEDURE TRAY) ×4 IMPLANT
PACK PLATELET PROCEDURE 60 (MISCELLANEOUS) ×4 IMPLANT
PACK SPY-PHI (KITS) ×4 IMPLANT
PAD ARMBOARD 7.5X6 YLW CONV (MISCELLANEOUS) ×8 IMPLANT
PAD ELECT DEFIB RADIOL ZOLL (MISCELLANEOUS) ×4 IMPLANT
PENCIL BUTTON HOLSTER BLD 10FT (ELECTRODE) ×4 IMPLANT
PLATE STERNAL 2.3X208 14H 2-PK (Plate) ×4 IMPLANT
POSITIONER HEAD DONUT 9IN (MISCELLANEOUS) ×4 IMPLANT
POWDER SURGICEL 3.0 GRAM (HEMOSTASIS) ×4 IMPLANT
PUNCH AORTIC ROTATE 4.0MM (MISCELLANEOUS) ×4 IMPLANT
SCREW LOCKING TI 2.3X13MM (Screw) ×24 IMPLANT
SCREW STERNAL LOCK 2.3MM (Screw) ×24 IMPLANT
SEALANT SURG COSEAL 8ML (VASCULAR PRODUCTS) ×4 IMPLANT
SET CARDIOPLEGIA MPS 5001102 (MISCELLANEOUS) ×4 IMPLANT
SHEARS HARMONIC 9CM CVD (BLADE) ×4 IMPLANT
SPONGE LAP 18X18 RF (DISPOSABLE) ×4 IMPLANT
SPONGE LAP 4X18 RFD (DISPOSABLE) ×4 IMPLANT
STAPLER VISISTAT 35W (STAPLE) ×8 IMPLANT
SUPPORT HEART JANKE-BARRON (MISCELLANEOUS) ×4 IMPLANT
SUT BONE WAX W31G (SUTURE) ×4 IMPLANT
SUT MNCRL AB 3-0 PS2 18 (SUTURE) ×8 IMPLANT
SUT PDS AB 1 CTX 36 (SUTURE) ×8 IMPLANT
SUT PROLENE 3 0 SH DA (SUTURE) ×4 IMPLANT
SUT PROLENE 5 0 C 1 36 (SUTURE) IMPLANT
SUT PROLENE 6 0 C 1 30 (SUTURE) ×12 IMPLANT
SUT PROLENE 8 0 BV175 6 (SUTURE) ×8 IMPLANT
SUT PROLENE BLUE 7 0 (SUTURE) ×4 IMPLANT
SUT SILK  1 MH (SUTURE) ×2
SUT SILK 1 MH (SUTURE) ×6 IMPLANT
SUT SILK 2 0 SH CR/8 (SUTURE) IMPLANT
SUT SILK 3 0 SH CR/8 (SUTURE) IMPLANT
SUT STEEL 6MS V (SUTURE) ×4 IMPLANT
SUT STEEL SZ 6 DBL 3X14 BALL (SUTURE) ×4 IMPLANT
SUT VIC AB 2-0 CT1 27 (SUTURE) ×1
SUT VIC AB 2-0 CT1 TAPERPNT 27 (SUTURE) ×3 IMPLANT
SUT VIC AB 2-0 CTX 27 (SUTURE) IMPLANT
SUT VIC AB 3-0 SH 27 (SUTURE)
SUT VIC AB 3-0 SH 27X BRD (SUTURE) IMPLANT
SUT VIC AB 3-0 X1 27 (SUTURE) IMPLANT
SYR 10ML LL (SYRINGE) ×4 IMPLANT
SYR 30ML LL (SYRINGE) IMPLANT
SYR 3ML LL SCALE MARK (SYRINGE) IMPLANT
SYR 50ML SLIP (SYRINGE) IMPLANT
SYSTEM SAHARA CHEST DRAIN ATS (WOUND CARE) ×4 IMPLANT
TAPE CLOTH SURG 4X10 WHT LF (GAUZE/BANDAGES/DRESSINGS) ×4 IMPLANT
TAPE PAPER 2X10 WHT MICROPORE (GAUZE/BANDAGES/DRESSINGS) ×4 IMPLANT
TOWEL GREEN STERILE (TOWEL DISPOSABLE) ×4 IMPLANT
TOWEL GREEN STERILE FF (TOWEL DISPOSABLE) ×4 IMPLANT
TRAY FOLEY SLVR 16FR TEMP STAT (SET/KITS/TRAYS/PACK) ×4 IMPLANT
TUBING LAP HI FLOW INSUFFLATIO (TUBING) IMPLANT
UNDERPAD 30X36 HEAVY ABSORB (UNDERPADS AND DIAPERS) ×4 IMPLANT
WATER STERILE IRR 1000ML POUR (IV SOLUTION) ×8 IMPLANT
WATER STERILE IRR 1000ML UROMA (IV SOLUTION) IMPLANT

## 2020-04-06 NOTE — Anesthesia Procedure Notes (Signed)
Central Venous Catheter Insertion Performed by: Catalina Gravel, MD, anesthesiologist Start/End8/20/2021 6:45 AM, 04/06/2020 6:55 AM Patient location: Pre-op. Preanesthetic checklist: patient identified, IV checked, site marked, risks and benefits discussed, surgical consent, monitors and equipment checked, pre-op evaluation, timeout performed and anesthesia consent Position: Trendelenburg Lidocaine 1% used for infiltration and patient sedated Hand hygiene performed , maximum sterile barriers used  and Seldinger technique used Catheter size: 8.5 Fr Central line was placed.Sheath introducer Procedure performed using ultrasound guided technique. Ultrasound Notes:anatomy identified, needle tip was noted to be adjacent to the nerve/plexus identified, no ultrasound evidence of intravascular and/or intraneural injection and image(s) printed for medical record Attempts: 1 Following insertion, line sutured, dressing applied and Biopatch. Post procedure assessment: free fluid flow, blood return through all ports and no air  Patient tolerated the procedure well with no immediate complications.

## 2020-04-06 NOTE — Anesthesia Procedure Notes (Addendum)
Procedure Name: Intubation Date/Time: 04/06/2020 7:57 AM Performed by: Leonor Liv, CRNA Pre-anesthesia Checklist: Patient identified, Emergency Drugs available, Suction available and Patient being monitored Patient Re-evaluated:Patient Re-evaluated prior to induction Oxygen Delivery Method: Circle System Utilized Preoxygenation: Pre-oxygenation with 100% oxygen Induction Type: IV induction Ventilation: Mask ventilation without difficulty and Oral airway inserted - appropriate to patient size Laryngoscope Size: Mac and 4 Grade View: Grade II Tube type: Oral Tube size: 8.0 mm Number of attempts: 1 Airway Equipment and Method: Stylet and Oral airway Placement Confirmation: ETT inserted through vocal cords under direct vision,  positive ETCO2 and breath sounds checked- equal and bilateral Secured at: 22 (@ teeth) cm Tube secured with: Tape Dental Injury: Teeth and Oropharynx as per pre-operative assessment

## 2020-04-06 NOTE — Anesthesia Procedure Notes (Signed)
Central Venous Catheter Insertion Performed by: Catalina Gravel, MD, anesthesiologist Start/End8/20/2021 6:55 AM, 04/06/2020 7:00 AM Patient location: Pre-op. Preanesthetic checklist: patient identified, IV checked, site marked, risks and benefits discussed, surgical consent, monitors and equipment checked, pre-op evaluation and timeout performed Position: Trendelenburg Hand hygiene performed  and maximum sterile barriers used  Total catheter length 100. PA cath was placed.Swan type:thermodilution PA Cath depth:48 Procedure performed without using ultrasound guided technique. Attempts: 1 Patient tolerated the procedure well with no immediate complications.

## 2020-04-06 NOTE — Brief Op Note (Signed)
04/06/2020  1:36 PM  PATIENT:  Richard Hatfield  64 y.o. male  PRE-OPERATIVE DIAGNOSIS:  Coronary Artery Disease  POST-OPERATIVE DIAGNOSIS:  Coronary Artery Disease  PROCEDURE: TRANSESOPHAGEAL ECHOCARDIOGRAM (TEE), CORONARY ARTERY BYPASS GRAFTING (CABG) x 4 (LIMA to LAD, LEFT RADIAL ARTERY HARVEST SEQUENTIALLY DIAGONAL and OM, RIMA to PLB) USING BILATERAL INTERNAL MAMMARY ARTERIES AND OPEN  LEFT RADIAL ARTERY HARVEST, and INDOCYANINE GREEN FLUORESCENCE IMAGING (ICG)   LEFT RADIAL ARTERY HARVEST: 40 minutes and LEFT RADIAL ARTERY PREP TIME: 7 minutes  SURGEON:  Surgeon(s) and Role:    Wonda Olds, MD - Primary  PHYSICIAN ASSISTANT: Lars Pinks PA-C  ANESTHESIA:   general  EBL:  760 mL   DRAINS: Chest tubes placed in the mediastinal and pleural spaces   LOCAL MEDICATIONS USED:  OTHER Exparel  COUNTS CORRECT:  YES  DICTATION: .Dragon Dictation  PLAN OF CARE: Admit to inpatient   PATIENT DISPOSITION:  ICU - intubated and hemodynamically stable.   Delay start of Pharmacological VTE agent (>24hrs) due to surgical blood loss or risk of bleeding: yes  BASELINE WEIGHT: 88 kg

## 2020-04-06 NOTE — Anesthesia Procedure Notes (Signed)
Arterial Line Insertion Start/End8/20/2021 7:15 AM, 04/06/2020 7:20 AM Performed by: Suzette Battiest, MD, anesthesiologist  Patient location: Pre-op. Preanesthetic checklist: patient identified, IV checked, site marked, risks and benefits discussed, surgical consent, monitors and equipment checked, pre-op evaluation, timeout performed and anesthesia consent Lidocaine 1% used for infiltration Right, radial was placed Catheter size: 20 Fr Hand hygiene performed  and maximum sterile barriers used   Attempts: 3 Procedure performed using ultrasound guided technique. Ultrasound Notes:anatomy identified, needle tip was noted to be adjacent to the nerve/plexus identified, no ultrasound evidence of intravascular and/or intraneural injection and image(s) printed for medical record Following insertion, dressing applied and Biopatch. Post procedure assessment: normal and unchanged  Patient tolerated the procedure well with no immediate complications.

## 2020-04-06 NOTE — H&P (Signed)
History and Physical Interval Note:  04/06/2020 7:10 AM  Richard Hatfield  has presented today for surgery, with the diagnosis of CAD.  The various methods of treatment have been discussed with the patient and family. After consideration of risks, benefits and other options for treatment, the patient has consented to  Procedure(s): CORONARY ARTERY BYPASS GRAFTING (CABG) (N/A) possible RADIAL ARTERY HARVEST (Left) TRANSESOPHAGEAL ECHOCARDIOGRAM (TEE) (N/A) INDOCYANINE GREEN FLUORESCENCE IMAGING (ICG) (N/A) as a surgical intervention.  The patient's history has been reviewed, patient examined, no change in status, stable for surgery.  I have reviewed the patient's chart and labs.  Questions were answered to the patient's satisfaction.     [address]       [place] [address]             [phone]     CARDIOTHORACIC SURGERY outpatient visitation  Referring Provider is No ref. provider found Primary Cardiologist is Shelva Majestic, MD PCP is System, Pcp Not In  No chief complaint on file.   HPI:  64 year old man was in his usual state of health until he experienced cardiac arrest at home and for around 02/28/2020.  He underwent 20 minutes of CPR and was ultimately fortunate to experience return of spontaneous circulation.  He was taken to [facility] where he underwent urgent left heart catheterization demonstrating severe multivessel coronary artery disease without clear culprit lesion.  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  He was discharged from the hospital to inpatient rehab and continue to improve.  At this point he is considered to be back to baseline in regards to his cognitive and physical conditioning.  He presents as an outpatient for consideration of coronary artery bypass grafting.  Past Medical History:  Diagnosis Date  . Cardiac arrest ([place])  02/28/2020  . Coronary artery disease 02/2020   pt admitted for STEMI in 02/2020 (per pt)  . Diabetes mellitus without complication ([place])   . Hypertension   . Myocardial infarction [facility])    per pt he had a heart attack earlier (02/2020) - see encounters    Past Surgical History:  Procedure Laterality Date  . LEFT HEART CATH AND CORONARY ANGIOGRAPHY N/A 02/28/2020   Procedure: LEFT HEART CATH AND CORONARY ANGIOGRAPHY;  Surgeon: Troy Sine, MD;  Location: [facility];  Service: Cardiovascular;  Laterality: N/A;    History reviewed. No pertinent family history.  Social History   Socioeconomic History  . Marital status: Married    Spouse name: Dorion Petillo  . Number of children: Not on file  . Years of education: Not on file  . Highest education level: Not on file  Occupational History  . Not on file  Tobacco Use  . Smoking status: Never Smoker  . Smokeless tobacco: Never Used  Vaping Use  . Vaping Use: Never used  Substance and Sexual Activity  . Alcohol use: Not Currently  . Drug use: Never  . Sexual activity: Not Currently    Partners: Female  Other Topics Concern  . Not on file  Social History Narrative  . Not on file   Social Determinants of Health   Financial Resource Strain:   . Difficulty of Paying Living Expenses: Not on file  Food Insecurity:   . Worried About Charity fundraiser in the Last Year: Not on file  .  Ran Out of Food in the Last Year: Not on file  Transportation Needs:   . Lack of Transportation (Medical): Not on file  . Lack of Transportation (Non-Medical): Not on file  Physical Activity:   . Days of Exercise per Week: Not on file  . Minutes of Exercise per Session: Not on file  Stress:   . Feeling of Stress : Not on file  Social Connections:   . Frequency of Communication with Friends and Family: Not on file  . Frequency of Social Gatherings with Friends and Family: Not on file  . Attends Religious Services: Not on file  .  Active Member of Clubs or Organizations: Not on file  . Attends Archivist Meetings: Not on file  . Marital Status: Not on file  Intimate Partner Violence:   . Fear of Current or Ex-Partner: Not on file  . Emotionally Abused: Not on file  . Physically Abused: Not on file  . Sexually Abused: Not on file    Current Facility-Administered Medications  Medication Dose Route Frequency Provider Last Rate Last Admin  . cefUROXime (ZINACEF) 1.5 g in sodium chloride 0.9 % 100 mL IVPB  1.5 g Intravenous To OR Mohammed Mcandrew Z, MD      . cefUROXime (ZINACEF) 750 mg in sodium chloride 0.9 % 100 mL IVPB  750 mg Intravenous To OR Kohlton Gilpatrick, Glenice Bow, MD      . chlorhexidine (HIBICLENS) 4 % liquid 2 application  30 mL Topical UD Maxmilian Trostel Z, MD      . dexmedetomidine (PRECEDEX) 400 MCG/100ML (4 mcg/mL) infusion  0.1-0.7 mcg/kg/hr Intravenous To OR Kalis Friese, Glenice Bow, MD      . EPINEPHrine (ADRENALIN) 4 mg in NS 250 mL (0.016 mg/mL) premix infusion  0-10 mcg/min Intravenous To OR Renatha Rosen Z, MD      . heparin 30,000 units/NS 1000 mL solution for CELLSAVER   Other To OR Luka Stohr, Glenice Bow, MD      . heparin sodium (porcine) 2,500 Units, papaverine 30 mg in electrolyte-148 (PLASMALYTE-148) 500 mL irrigation   Irrigation To OR Madia Carvell Z, MD      . insulin regular, human (MYXREDLIN) 100 units/ 100 mL infusion   Intravenous To OR Nhyla Nappi Z, MD      . magnesium sulfate (IV Push/IM) injection 40 mEq  40 mEq Other To OR Gerritt Galentine, Glenice Bow, MD      . metoprolol tartrate (LOPRESSOR) tablet 12.5 mg  12.5 mg Oral Once Tyger Oka, Glenice Bow, MD      . milrinone (PRIMACOR) 20 MG/100 ML (0.2 mg/mL) infusion  0.3 mcg/kg/min Intravenous To OR Shakena Callari Z, MD      . nitroGLYCERIN 50 mg in dextrose 5 % 250 mL (0.2 mg/mL) infusion  2-200 mcg/min Intravenous To OR Purva Vessell, Glenice Bow, MD      . norepinephrine (LEVOPHED) 4mg  in 278mL premix infusion  0-40 mcg/min Intravenous To OR Lashanta Elbe  Z, MD      . phenylephrine (NEOSYNEPHRINE) 20-0.9 MG/250ML-% infusion  30-200 mcg/min Intravenous To OR Shanda Cadotte Z, MD      . potassium chloride injection 80 mEq  80 mEq Other To OR Scotland Korver, Glenice Bow, MD      . tranexamic acid (CYKLOKAPRON) 2,500 mg in sodium chloride 0.9 % 250 mL (10 mg/mL) infusion  1.5 mg/kg/hr Intravenous To OR Talesha Ellithorpe Z, MD      . tranexamic acid (CYKLOKAPRON) bolus via infusion - over 30 minutes 1,326 mg  15 mg/kg Intravenous To OR Pegge Cumberledge, Glenice Bow, MD      . tranexamic acid (CYKLOKAPRON) pump prime solution 177 mg  2 mg/kg Intracatheter To OR Ailis Rigaud Z, MD      . vancomycin (VANCOREADY) IVPB 1500 mg/300 mL  1,500 mg Intravenous To OR Darleen Moffitt, Glenice Bow, MD        No Known Allergies    Review of Systems:   General:  Improved energy, no weight change, denies fever  Cardiac:  As per HPI  Respiratory:  Denies shortness of breath or dyspnea on exertion.  GI:   Negative  GU:   Negative  Vascular:  Negative  Neuro:   As per HPI  Musculoskeletal: Negative  Skin:   Negative  Psych:   Negative  Eyes:   Negative  ENT:   Negative  Hematologic:  Negative  Endocrine:  No diabetes, does not check CBG's at home     Physical Exam:   BP (!) 143/74   Pulse (!) 59   Temp 97.7 F (36.5 C) (Temporal)   Resp 18   Ht 5\' 9"  (1.753 m)   Wt 88.4 kg   SpO2 100%   BMI 28.78 kg/m   General:   well-appearing; interactive and conversant  HEENT:  Unremarkable   Neck:   no JVD, no bruits, no adenopathy   Chest:   clear to auscultation, symmetrical breath sounds, no wheezes, no rhonchi   CV:   RRR, no  murmur   Abdomen:  soft, non-tender, no masses   Extremities:  warm, well-perfused, pulses intact throughout, no LE edema  Rectal/GU  Deferred  Neuro:   Grossly non-focal and symmetrical throughout  Skin:   Clean and dry, no rashes, no breakdown   Diagnostic Tests:  I have personally reviewed his available imaging studies including a reevaluation of  his left heart catheterization and echocardiogram performed in July as an inpatient.   Impression:  64 year old man has made a remarkable cover he after cardiac arrest at home with neurological disorder resulting.  He appears in suitable condition for coronary artery bypass graft surgery.   Plan:  Tentatively scheduled coronary artery bypass grafting for later this week.  The patient and his wife understand the pros and cons of undergoing surgery and have had the opportunity to ask questions to their apparent satisfaction;  they wish to proceed.   I spent in excess of 30 minutes during the conduct of this office consultation and >50% of this time involved direct face-to-face encounter with the patient for counseling and/or coordination of their care.          Level 3 Office Consult = 40 minutes         Level 4 Office Consult = 60 minutes         Level 5 Office Consult = 80 minutes  B.  Murvin Natal, MD 04/06/2020 7:10 AM

## 2020-04-06 NOTE — Progress Notes (Signed)
Patient ID: Richard Hatfield, male   DOB: [birth year], 64 y.o.   MRN: [id number]  TCTS Evening Rounds:   Hemodynamically stable  CI = 1.8 on no inotropes  Has started to wake up on vent. Weaning.  Urine output good  CT output 120 cc last hr. Coags nl.  CBC    Component Value Date/Time   WBC 9.6 04/06/2020 1448   RBC 3.00 (L) 04/06/2020 1448   HGB 9.7 (L) 04/06/2020 1448   HCT 28.5 (L) 04/06/2020 1448   PLT 127 (L) 04/06/2020 1448   MCV 95.0 04/06/2020 1448   MCH 32.3 04/06/2020 1448   MCHC 34.0 04/06/2020 1448   RDW 11.6 04/06/2020 1448   LYMPHSABS 1.8 03/09/2020 1501   MONOABS 1.2 (H) 03/09/2020 1501   EOSABS 0.3 03/09/2020 1501   BASOSABS 0.1 03/09/2020 1501     BMET    Component Value Date/Time   NA 145 04/06/2020 1305   K 3.7 04/06/2020 1305   CL 106 04/06/2020 1301   CO2 21 (L) 04/04/2020 0833   GLUCOSE 125 (H) 04/06/2020 1301   BUN 6 (L) 04/06/2020 1301   CREATININE 0.50 (L) 04/06/2020 1301   CALCIUM 9.6 04/04/2020 0833   GFRNONAA >60 04/04/2020 0833   GFRAA >60 04/04/2020 0833     A/P:  Stable postop course. Continue current plans. Wean to extubate.

## 2020-04-06 NOTE — Progress Notes (Signed)
  Echocardiogram Echocardiogram Transesophageal has been performed.  Richard Hatfield 04/06/2020, 8:53 AM

## 2020-04-06 NOTE — Hospital Course (Addendum)
HPI:  This is a 64 year old man was in his usual state of health until he experienced cardiac arrest at home and for around 02/28/2020.  He underwent 20 minutes of CPR and was ultimately fortunate to experience return of spontaneous circulation.  He was taken to [facility] where he underwent urgent left heart catheterization demonstrating severe multivessel coronary artery disease without clear culprit lesion.  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  He was discharged from the hospital to inpatient rehab and continue to improve.  At this point, he is considered to be back to baseline in regards to his cognitive and physical conditioning.  He presents as an outpatient for consideration of coronary artery bypass grafting. Dr. Orvan Seen discussed the need for coronary artery bypass grafting surgery. Potential risks, benefits, and complications of the surgery were discussed with the patient and he agreed to proceed with surgery. Pre operative carotid US showed no significant internal carotid artery stenosis bilaterally. He underwent a CABG x 4 by Dr. Orvan Seen on 08/202021.  Hospital Course: Patient was extubated the evening of surgery. He remained afebrile and hemodynamically stable. Gordy Councilman, a line, chest tubes, and foley were removed early in the post operative course. He was weaned off Nitro drip (for radial artery harvest) and started on Isordil. Lopressor was started and titrated accordingly. He was volume over loaded and diuresed. He had ABL anemia. He did not require a post op transfusion. Last H and H was  8.9 and 26.6. He was weaned off the insulin drip.  Once he was tolerating a diet, home diabetic medicines were restarted.  The patient's glucose remained well controlled.The patient's HGA1C pre op was 8.2. He will need close medical follow up after discharge.  The patient was felt surgically stable for transfer from the  ICU to PCTU for further convalescence on 08/22. He went into a fib with RVR the evening of 08/22. He was given Lopressor and put on Amiodarone. He converted and remained in sinus rhythm. He continues to progress with cardiac rehab. He was ambulating on room air. He has been tolerating a diet and has had a bowel movement. Chest tube sutures will be removed the day of discharge. The patient is felt surgically stable for discharge today.

## 2020-04-06 NOTE — Transfer of Care (Signed)
Immediate Anesthesia Transfer of Care Note  Patient: Richard Hatfield  Procedure(s) Performed: CORONARY ARTERY BYPASS GRAFTING (CABG), ON PUMP, TIMES FOUR, USING BILATERAL INTERNAL MAMMARY ARTERIES AND HARVESTED LEFT RADIAL ARTERY (OPEN) (N/A Chest) RADIAL ARTERY HARVEST (Left Arm Lower) TRANSESOPHAGEAL ECHOCARDIOGRAM (TEE) (N/A ) INDOCYANINE GREEN FLUORESCENCE IMAGING (ICG) (N/A )  Patient Location: SICU  Anesthesia Type:General  Level of Consciousness: sedated, unresponsive and Patient remains intubated per anesthesia plan  Airway & Oxygen Therapy: Patient remains intubated per anesthesia plan and Patient placed on Ventilator (see vital sign flow sheet for setting)  Post-op Assessment: Report given to RN and Post -op Vital signs reviewed and stable  Post vital signs: Reviewed and stable  Last Vitals:  Vitals Value Taken Time  BP    Temp 36.6 C 04/06/20 1432  Pulse 81 04/06/20 1432  Resp 12 04/06/20 1432  SpO2 100 % 04/06/20 1432  Vitals shown include unvalidated device data.  Last Pain:  Vitals:   04/06/20 0619  TempSrc:   PainSc: 0-No pain      Patients Stated Pain Goal: 3 (81/85/63 1497)  Complications: No complications documented.

## 2020-04-06 NOTE — Procedures (Signed)
Extubation Procedure Note  Patient Details:   Name: Richard Hatfield DOB: 05-14-1956 MRN: [id number]   Airway Documentation:    Vent end date: 04/06/20 Vent end time: 1742   Evaluation  O2 sats: stable throughout Complications: No apparent complications Patient did tolerate procedure well. Bilateral Breath Sounds: Clear, Diminished   Yes   Patient extubated to 4L nasal cannula per protocol.  Positive cuff leak noted.  No evidence of stridor.  NIF of -30, VC of 6108mL performed.  Patient able to speak post extubation.  Incentive spirometry performed x5 with achieved goal of 750.  No complications noted.    Judith Part 04/06/2020, 5:46 PM

## 2020-04-07 ENCOUNTER — Inpatient Hospital Stay (HOSPITAL_COMMUNITY): Payer: BC Managed Care – PPO

## 2020-04-07 LAB — BASIC METABOLIC PANEL
Anion gap: 8 (ref 5–15)
Anion gap: 11 (ref 5–15)
BUN: 6 mg/dL — ABNORMAL LOW (ref 8–23)
BUN: 9 mg/dL (ref 8–23)
CO2: 24 mmol/L (ref 22–32)
CO2: 21 mmol/L — ABNORMAL LOW (ref 22–32)
Calcium: 8.5 mg/dL — ABNORMAL LOW (ref 8.9–10.3)
Calcium: 8.4 mg/dL — ABNORMAL LOW (ref 8.9–10.3)
Chloride: 108 mmol/L (ref 98–111)
Chloride: 102 mmol/L (ref 98–111)
Creatinine, Ser: 0.71 mg/dL (ref 0.61–1.24)
Creatinine, Ser: 0.86 mg/dL (ref 0.61–1.24)
GFR calc Af Amer: >60 mL/min (ref >60)
GFR calc Af Amer: >60 mL/min (ref >60)
GFR calc non Af Amer: >60 mL/min (ref >60)
GFR calc non Af Amer: >60 mL/min (ref >60)
Glucose, Bld: 135 mg/dL — ABNORMAL HIGH (ref 70–99)
Glucose, Bld: 252 mg/dL — ABNORMAL HIGH (ref 70–99)
Potassium: 4.6 mmol/L (ref 3.5–5.1)
Potassium: 4.2 mmol/L (ref 3.5–5.1)
Sodium: 140 mmol/L (ref 135–145)
Sodium: 134 mmol/L — ABNORMAL LOW (ref 135–145)

## 2020-04-07 LAB — CBC
HCT: 30.2 % — ABNORMAL LOW (ref 39.0–52.0)
HCT: 30.5 % — ABNORMAL LOW (ref 39.0–52.0)
HCT: 28.8 % — ABNORMAL LOW (ref 39.0–52.0)
Hemoglobin: 10 g/dL — ABNORMAL LOW (ref 13.0–17.0)
Hemoglobin: 9.9 g/dL — ABNORMAL LOW (ref 13.0–17.0)
Hemoglobin: 9.2 g/dL — ABNORMAL LOW (ref 13.0–17.0)
MCH: 31.8 pg (ref 26.0–34.0)
MCH: 31.6 pg (ref 26.0–34.0)
MCH: 31.2 pg (ref 26.0–34.0)
MCHC: 33.1 g/dL (ref 30.0–36.0)
MCHC: 32.5 g/dL (ref 30.0–36.0)
MCHC: 31.9 g/dL (ref 30.0–36.0)
MCV: 96.2 fL (ref 80.0–100.0)
MCV: 97.4 fL (ref 80.0–100.0)
MCV: 97.6 fL (ref 80.0–100.0)
Platelets: 151 10*3/uL (ref 150–400)
Platelets: 145 10*3/uL — ABNORMAL LOW (ref 150–400)
Platelets: 145 10*3/uL — ABNORMAL LOW (ref 150–400)
RBC: 3.14 MIL/uL — ABNORMAL LOW (ref 4.22–5.81)
RBC: 3.13 MIL/uL — ABNORMAL LOW (ref 4.22–5.81)
RBC: 2.95 MIL/uL — ABNORMAL LOW (ref 4.22–5.81)
RDW: 12 % (ref 11.5–15.5)
RDW: 12.2 % (ref 11.5–15.5)
RDW: 12.2 % (ref 11.5–15.5)
WBC: 9.8 10*3/uL (ref 4.0–10.5)
WBC: 8.4 10*3/uL (ref 4.0–10.5)
WBC: 9.4 10*3/uL (ref 4.0–10.5)
nRBC: 0 % (ref 0.0–0.2)
nRBC: 0 % (ref 0.0–0.2)
nRBC: 0 % (ref 0.0–0.2)

## 2020-04-07 LAB — CREATININE, SERUM
Creatinine, Ser: 0.82 mg/dL (ref 0.61–1.24)
GFR calc Af Amer: >60 mL/min (ref >60)
GFR calc non Af Amer: >60 mL/min (ref >60)

## 2020-04-07 LAB — GLUCOSE, CAPILLARY
Glucose-Capillary: 114 mg/dL — ABNORMAL HIGH (ref 70–99)
Glucose-Capillary: 125 mg/dL — ABNORMAL HIGH (ref 70–99)
Glucose-Capillary: 131 mg/dL — ABNORMAL HIGH (ref 70–99)
Glucose-Capillary: 132 mg/dL — ABNORMAL HIGH (ref 70–99)
Glucose-Capillary: 135 mg/dL — ABNORMAL HIGH (ref 70–99)
Glucose-Capillary: 142 mg/dL — ABNORMAL HIGH (ref 70–99)
Glucose-Capillary: 153 mg/dL — ABNORMAL HIGH (ref 70–99)
Glucose-Capillary: 168 mg/dL — ABNORMAL HIGH (ref 70–99)
Glucose-Capillary: 169 mg/dL — ABNORMAL HIGH (ref 70–99)
Glucose-Capillary: 180 mg/dL — ABNORMAL HIGH (ref 70–99)
Glucose-Capillary: 184 mg/dL — ABNORMAL HIGH (ref 70–99)
Glucose-Capillary: 212 mg/dL — ABNORMAL HIGH (ref 70–99)
Glucose-Capillary: 214 mg/dL — ABNORMAL HIGH (ref 70–99)

## 2020-04-07 LAB — MAGNESIUM
Magnesium: 2.2 mg/dL (ref 1.7–2.4)
Magnesium: 1.9 mg/dL (ref 1.7–2.4)

## 2020-04-07 MED ORDER — ENOXAPARIN SODIUM 40 MG/0.4ML ~~LOC~~ SOLN
40.0000 mg | Freq: Every day | SUBCUTANEOUS | Status: DC
  Administered 2020-04-07 – 2020-04-10 (×4): 40 mg via SUBCUTANEOUS
  Filled 2020-04-07 (×4): qty 0.4

## 2020-04-07 MED ORDER — INSULIN DETEMIR 100 UNIT/ML ~~LOC~~ SOLN: 20.0000 [IU] | Freq: Two times a day (BID) | SUBCUTANEOUS | Status: DC

## 2020-04-07 MED ORDER — INSULIN DETEMIR 100 UNIT/ML ~~LOC~~ SOLN
20.0000 [IU] | Freq: Two times a day (BID) | SUBCUTANEOUS | Status: DC
  Administered 2020-04-07 (×2): 20 [IU] via SUBCUTANEOUS
  Filled 2020-04-07 (×4): qty 0.2

## 2020-04-07 MED ORDER — FUROSEMIDE 10 MG/ML IJ SOLN
40.0000 mg | Freq: Once | INTRAMUSCULAR | Status: AC
  Administered 2020-04-07: 40 mg via INTRAVENOUS
  Filled 2020-04-07: qty 4

## 2020-04-07 MED ORDER — INSULIN ASPART 100 UNIT/ML ~~LOC~~ SOLN
0.0000 [IU] | SUBCUTANEOUS | Status: DC
  Administered 2020-04-07 (×2): 2 [IU] via SUBCUTANEOUS
  Administered 2020-04-07: 8 [IU] via SUBCUTANEOUS
  Administered 2020-04-07: 4 [IU] via SUBCUTANEOUS
  Administered 2020-04-08: 2 [IU] via SUBCUTANEOUS

## 2020-04-07 NOTE — Progress Notes (Signed)
Patient ID: Richard Hatfield, male   DOB: [birth year], 64 y.o.   MRN: [id number] TCTS Evening Rounds:  Hemodynamically stable off neo.  Up in chair. Ambulated.  Chest tube output moderate serosanguinous today. Will keep tubes in until tomorrow.  Will start diuresis this pm.

## 2020-04-07 NOTE — Progress Notes (Signed)
1 Day Post-Op Procedure(s) (LRB): CORONARY ARTERY BYPASS GRAFTING (CABG), ON PUMP, TIMES FOUR, USING BILATERAL INTERNAL MAMMARY ARTERIES AND HARVESTED LEFT RADIAL ARTERY (OPEN) (N/A) RADIAL ARTERY HARVEST (Left) TRANSESOPHAGEAL ECHOCARDIOGRAM (TEE) (N/A) INDOCYANINE GREEN FLUORESCENCE IMAGING (ICG) (N/A) Subjective:  Sore but otherwise feels ok  Objective: Vital signs in last 24 hours: Temp:  [97.7 F (36.5 C)-99.9 F (37.7 C)] 98.4 F (36.9 C) (08/21 0700) Pulse Rate:  [49-82] 79 (08/21 0700) Resp:  [8-32] 20 (08/21 0700) BP: (99-140)/(57-90) 112/64 (08/21 0700) SpO2:  [85 %-100 %] 100 % (08/21 0700) Arterial Line BP: (80-133)/(53-74) 80/69 (08/21 0000) FiO2 (%):  [40 %-50 %] 40 % (08/20 1710) Weight:  [89.7 kg] 89.7 kg (08/21 0431)  Hemodynamic parameters for last 24 hours: PAP: (17-45)/(7-32) 29/18 CO:  [4.8 L/min-7 L/min] 7 L/min CI:  [1.8 L/min/m2-2.6 L/min/m2] 2.6 L/min/m2  Intake/Output from previous day: 08/20 0701 - 08/21 0700 In: 5167.6 [I.V.:3399.8; Blood:383; IV Piggyback:1384.8] Out: 0539 [Urine:3010; Blood:760; Chest Tube:960] Intake/Output this shift: Total I/O In: 81.1 [I.V.:81.1] Out: 60 [Urine:60]  General appearance: alert and cooperative Neurologic: intact Heart: regular rate and rhythm, S1, S2 normal, no murmur Lungs: clear to auscultation bilaterally Extremities: edema mild Wound: Vac dressing in place  Lab Results: Recent Labs    04/06/20 2003 04/07/20 0350  WBC 9.9 9.8  HGB 10.1* 10.0*  HCT 30.6* 30.2*  PLT 141* 151   BMET:  Recent Labs    04/06/20 2003 04/07/20 0350  NA 141 140  K 4.2 4.6  CL 111 108  CO2 23 24  GLUCOSE 114* 135*  BUN 7* 6*  CREATININE 0.72 0.71  CALCIUM 8.2* 8.5*    PT/INR:  Recent Labs    04/06/20 1448  LABPROT 16.8*  INR 1.4*   ABG    Component Value Date/Time   PHART 7.315 (L) 04/06/2020 1845   HCO3 23.3 04/06/2020 1845   TCO2 25 04/06/2020 1845   ACIDBASEDEF 3.0 (H) 04/06/2020 1845   O2SAT  94.0 04/06/2020 1845   CBG (last 3)  Recent Labs    04/07/20 0326 04/07/20 0628 04/07/20 0828  GLUCAP 132* 114* 168*   CXR: clear  ECG: VVI paced this am  Assessment/Plan: S/P Procedure(s) (LRB): CORONARY ARTERY BYPASS GRAFTING (CABG), ON PUMP, TIMES FOUR, USING BILATERAL INTERNAL MAMMARY ARTERIES AND HARVESTED LEFT RADIAL ARTERY (OPEN) (N/A) RADIAL ARTERY HARVEST (Left) TRANSESOPHAGEAL ECHOCARDIOGRAM (TEE) (N/A) INDOCYANINE GREEN FLUORESCENCE IMAGING (ICG) (N/A)  POD 1 Hemodynamically stable in sinus 60-70. Will hold Lopressor for now.  Chest tube output still moderate, thin, serosanguinous. Will leave tubes in for now and dangle, OOB. Possibly out later today.  DM: preop Hgb A1c 8.2. Will start Levemir and SSI. Stop insulin drip.  Mild volume excess: wt only 3 lbs over preop. Wait to diurese until off neo.  Left radial artery graft. Start isosorbide and stop NTG drip.  IS, OOB.   LOS: 1 day    Richard Hatfield 04/07/2020

## 2020-04-08 ENCOUNTER — Inpatient Hospital Stay (HOSPITAL_COMMUNITY): Payer: BC Managed Care – PPO

## 2020-04-08 LAB — BASIC METABOLIC PANEL
Anion gap: 9 (ref 5–15)
BUN: 10 mg/dL (ref 8–23)
CO2: 24 mmol/L (ref 22–32)
Calcium: 8.6 mg/dL — ABNORMAL LOW (ref 8.9–10.3)
Chloride: 104 mmol/L (ref 98–111)
Creatinine, Ser: 0.77 mg/dL (ref 0.61–1.24)
GFR calc Af Amer: >60 mL/min (ref >60)
GFR calc non Af Amer: >60 mL/min (ref >60)
Glucose, Bld: 161 mg/dL — ABNORMAL HIGH (ref 70–99)
Potassium: 4.1 mmol/L (ref 3.5–5.1)
Sodium: 137 mmol/L (ref 135–145)

## 2020-04-08 LAB — CBC
HCT: 27.4 % — ABNORMAL LOW (ref 39.0–52.0)
Hemoglobin: 9.1 g/dL — ABNORMAL LOW (ref 13.0–17.0)
MCH: 32.3 pg (ref 26.0–34.0)
MCHC: 33.2 g/dL (ref 30.0–36.0)
MCV: 97.2 fL (ref 80.0–100.0)
Platelets: 115 10*3/uL — ABNORMAL LOW (ref 150–400)
RBC: 2.82 MIL/uL — ABNORMAL LOW (ref 4.22–5.81)
RDW: 12 % (ref 11.5–15.5)
WBC: 8 10*3/uL (ref 4.0–10.5)
nRBC: 0 % (ref 0.0–0.2)

## 2020-04-08 LAB — GLUCOSE, CAPILLARY
Glucose-Capillary: 150 mg/dL — ABNORMAL HIGH (ref 70–99)
Glucose-Capillary: 201 mg/dL — ABNORMAL HIGH (ref 70–99)
Glucose-Capillary: 226 mg/dL — ABNORMAL HIGH (ref 70–99)
Glucose-Capillary: 150 mg/dL — ABNORMAL HIGH (ref 70–99)
Glucose-Capillary: 148 mg/dL — ABNORMAL HIGH (ref 70–99)

## 2020-04-08 MED ORDER — DOCUSATE SODIUM 100 MG PO CAPS: 200.0000 mg | ORAL_CAPSULE | Freq: Every day | ORAL | Status: DC

## 2020-04-08 MED ORDER — METOPROLOL TARTRATE 5 MG/5ML IV SOLN
5.0000 mg | Freq: Four times a day (QID) | INTRAVENOUS | Status: DC | PRN
  Administered 2020-04-08: 5 mg via INTRAVENOUS
  Filled 2020-04-08: qty 5

## 2020-04-08 MED ORDER — SODIUM CHLORIDE 0.9% FLUSH: 3.0000 mL | INTRAVENOUS | Status: DC | PRN

## 2020-04-08 MED ORDER — ONDANSETRON HCL 4 MG PO TABS: 4.0000 mg | ORAL_TABLET | Freq: Four times a day (QID) | ORAL | Status: DC | PRN

## 2020-04-08 MED ORDER — INSULIN DETEMIR 100 UNIT/ML ~~LOC~~ SOLN
25.0000 [IU] | Freq: Two times a day (BID) | SUBCUTANEOUS | Status: DC
  Administered 2020-04-08 – 2020-04-11 (×7): 25 [IU] via SUBCUTANEOUS
  Filled 2020-04-08 (×9): qty 0.25

## 2020-04-08 MED ORDER — SODIUM CHLORIDE 0.9 % IV SOLN: 250.0000 mL | INTRAVENOUS | Status: DC | PRN

## 2020-04-08 MED ORDER — AMIODARONE HCL 200 MG PO TABS
400.0000 mg | ORAL_TABLET | Freq: Two times a day (BID) | ORAL | Status: DC
  Administered 2020-04-08 – 2020-04-11 (×6): 400 mg via ORAL
  Filled 2020-04-08 (×7): qty 2

## 2020-04-08 MED ORDER — PANTOPRAZOLE SODIUM 40 MG PO TBEC
40.0000 mg | DELAYED_RELEASE_TABLET | Freq: Every day | ORAL | Status: DC
  Administered 2020-04-09 – 2020-04-11 (×3): 40 mg via ORAL
  Filled 2020-04-08 (×3): qty 1

## 2020-04-08 MED ORDER — ACETAMINOPHEN 325 MG PO TABS: 650.0000 mg | ORAL_TABLET | Freq: Four times a day (QID) | ORAL | Status: DC | PRN

## 2020-04-08 MED ORDER — ASPIRIN EC 325 MG PO TBEC
325.0000 mg | DELAYED_RELEASE_TABLET | Freq: Every day | ORAL | Status: DC
  Administered 2020-04-09 – 2020-04-11 (×3): 325 mg via ORAL
  Filled 2020-04-08 (×3): qty 1

## 2020-04-08 MED ORDER — IRBESARTAN 75 MG PO TABS
37.5000 mg | ORAL_TABLET | Freq: Every day | ORAL | Status: DC
  Administered 2020-04-08 – 2020-04-11 (×4): 37.5 mg via ORAL
  Filled 2020-04-08 (×4): qty 0.5

## 2020-04-08 MED ORDER — POTASSIUM CHLORIDE CRYS ER 20 MEQ PO TBCR
20.0000 meq | EXTENDED_RELEASE_TABLET | Freq: Two times a day (BID) | ORAL | Status: AC
  Administered 2020-04-08 (×2): 20 meq via ORAL
  Filled 2020-04-08 (×2): qty 1

## 2020-04-08 MED ORDER — ONDANSETRON HCL 4 MG/2ML IJ SOLN: 4.0000 mg | Freq: Four times a day (QID) | INTRAMUSCULAR | Status: DC | PRN

## 2020-04-08 MED ORDER — INSULIN ASPART 100 UNIT/ML ~~LOC~~ SOLN
0.0000 [IU] | Freq: Three times a day (TID) | SUBCUTANEOUS | Status: DC
  Administered 2020-04-08 – 2020-04-09 (×3): 2 [IU] via SUBCUTANEOUS
  Administered 2020-04-09: 4 [IU] via SUBCUTANEOUS
  Administered 2020-04-10: 8 [IU] via SUBCUTANEOUS
  Administered 2020-04-10: 2 [IU] via SUBCUTANEOUS
  Administered 2020-04-11: 4 [IU] via SUBCUTANEOUS

## 2020-04-08 MED ORDER — FUROSEMIDE 40 MG PO TABS
40.0000 mg | ORAL_TABLET | Freq: Every day | ORAL | Status: AC
  Administered 2020-04-09 – 2020-04-11 (×3): 40 mg via ORAL
  Filled 2020-04-08 (×3): qty 1

## 2020-04-08 MED ORDER — METOPROLOL TARTRATE 12.5 MG HALF TABLET
12.5000 mg | ORAL_TABLET | Freq: Two times a day (BID) | ORAL | Status: DC
  Administered 2020-04-08 – 2020-04-11 (×7): 12.5 mg via ORAL
  Filled 2020-04-08 (×8): qty 1

## 2020-04-08 MED ORDER — ~~LOC~~ CARDIAC SURGERY, PATIENT & FAMILY EDUCATION
Freq: Once | Status: DC
  Filled 2020-04-08: qty 1

## 2020-04-08 MED ORDER — TRAMADOL HCL 50 MG PO TABS: 50.0000 mg | ORAL_TABLET | Freq: Four times a day (QID) | ORAL | Status: DC | PRN

## 2020-04-08 MED ORDER — FUROSEMIDE 10 MG/ML IJ SOLN
40.0000 mg | Freq: Once | INTRAMUSCULAR | Status: AC
  Administered 2020-04-08: 40 mg via INTRAVENOUS
  Filled 2020-04-08: qty 4

## 2020-04-08 MED ORDER — INSULIN ASPART 100 UNIT/ML ~~LOC~~ SOLN
0.0000 [IU] | Freq: Three times a day (TID) | SUBCUTANEOUS | Status: DC
  Administered 2020-04-08 (×2): 8 [IU] via SUBCUTANEOUS

## 2020-04-08 MED ORDER — SODIUM CHLORIDE 0.9% FLUSH
3.0000 mL | Freq: Two times a day (BID) | INTRAVENOUS | Status: DC
  Administered 2020-04-08 – 2020-04-11 (×6): 3 mL via INTRAVENOUS

## 2020-04-08 MED ORDER — OXYCODONE HCL 5 MG PO TABS: 5.0000 mg | ORAL_TABLET | ORAL | Status: DC | PRN

## 2020-04-08 MED ORDER — POTASSIUM CHLORIDE CRYS ER 20 MEQ PO TBCR
20.0000 meq | EXTENDED_RELEASE_TABLET | Freq: Two times a day (BID) | ORAL | Status: DC
  Administered 2020-04-09 – 2020-04-11 (×5): 20 meq via ORAL
  Filled 2020-04-08 (×5): qty 1

## 2020-04-08 NOTE — Progress Notes (Signed)
After IV metoprolol 5mg , pt's HR is back to NSR so far, Amio will be given later tonight, pt is comfortable and watching TV, wife is in bed side and updated, will continue to monitor Palma Holter, RN

## 2020-04-08 NOTE — Progress Notes (Signed)
2 Days Post-Op Procedure(s) (LRB): CORONARY ARTERY BYPASS GRAFTING (CABG), ON PUMP, TIMES FOUR, USING BILATERAL INTERNAL MAMMARY ARTERIES AND HARVESTED LEFT RADIAL ARTERY (OPEN) (N/A) RADIAL ARTERY HARVEST (Left) TRANSESOPHAGEAL ECHOCARDIOGRAM (TEE) (N/A) INDOCYANINE GREEN FLUORESCENCE IMAGING (ICG) (N/A) Subjective:  Sore but otherwise feels ok  Objective: Vital signs in last 24 hours: Temp:  [98.2 F (36.8 C)-99.2 F (37.3 C)] 98.3 F (36.8 C) (08/22 0700) Pulse Rate:  [66-95] 83 (08/22 0600) Resp:  [11-35] 24 (08/22 0600) BP: (116-199)/(64-109) 132/74 (08/22 0600) SpO2:  [86 %-100 %] 96 % (08/22 0600) Weight:  [89.7 kg] 89.7 kg (08/22 0457)  Hemodynamic parameters for last 24 hours: PAP: (38)/(24) 38/24  Intake/Output from previous day: 08/21 0701 - 08/22 0700 In: 390.1 [P.O.:220; I.V.:170.1] Out: 1915 [Urine:1545; Chest Tube:370] Intake/Output this shift: Total I/O In: -  Out: 165 [Urine:125; Chest Tube:40]  General appearance: alert and cooperative Neurologic: intact Heart: regular rate and rhythm, S1, S2 normal, no murmur, click, rub or gallop Lungs: clear to auscultation bilaterally Extremities: edema mild Wound: VAC chest dressing in place  Lab Results: Recent Labs    04/07/20 1740 04/08/20 0456  WBC 9.4 8.0  HGB 9.2* 9.1*  HCT 28.8* 27.4*  PLT 145* 115*   BMET:  Recent Labs    04/07/20 1740 04/08/20 0456  NA 134* 137  K 4.2 4.1  CL 102 104  CO2 21* 24  GLUCOSE 252* 161*  BUN 9 10  CREATININE 0.86 0.77  CALCIUM 8.4* 8.6*    PT/INR:  Recent Labs    04/06/20 1448  LABPROT 16.8*  INR 1.4*   ABG    Component Value Date/Time   PHART 7.315 (L) 04/06/2020 1845   HCO3 23.3 04/06/2020 1845   TCO2 25 04/06/2020 1845   ACIDBASEDEF 3.0 (H) 04/06/2020 1845   O2SAT 94.0 04/06/2020 1845   CBG (last 3)  Recent Labs    04/07/20 2321 04/08/20 0342 04/08/20 0805  GLUCAP 142* 150* 201*   CXR: mild bibasilar  atelectasis.  Assessment/Plan: S/P Procedure(s) (LRB): CORONARY ARTERY BYPASS GRAFTING (CABG), ON PUMP, TIMES FOUR, USING BILATERAL INTERNAL MAMMARY ARTERIES AND HARVESTED LEFT RADIAL ARTERY (OPEN) (N/A) RADIAL ARTERY HARVEST (Left) TRANSESOPHAGEAL ECHOCARDIOGRAM (TEE) (N/A) INDOCYANINE GREEN FLUORESCENCE IMAGING (ICG) (N/A)  POD 2  Hemodynamically stable in sinus 80. Will resume low dose Lopressor and resume Avapro for HTN.  Chest tube output decreased. Will remove all tubes.  DM: preop Hgb A1c 8.2. CBG's 140-200. Will increase Levemir to 25 bid and SSI to [facility].  Mild volume excess: Continue diuresis.  Left radial artery graft. Start isosorbide and stop NTG drip.  IS, OOB.  DC foley and sleeve.  Transfer to 4E.   LOS: 2 days    Gaye Pollack 04/08/2020

## 2020-04-08 NOTE — Progress Notes (Addendum)
Pt's HR is back and forth to NSR and A-fib/RVR, HR is goes up to 160's and come back to 80's.Erin PA called and night dose 12.5 mg lopressor oral given, it is non-sustained and pt is asymptomatic, will continue to monitor the patient closely.  Palma Holter, RN

## 2020-04-09 ENCOUNTER — Inpatient Hospital Stay (HOSPITAL_COMMUNITY): Payer: BC Managed Care – PPO

## 2020-04-09 LAB — CBC
HCT: 26.6 % — ABNORMAL LOW (ref 39.0–52.0)
Hemoglobin: 8.9 g/dL — ABNORMAL LOW (ref 13.0–17.0)
MCH: 31.6 pg (ref 26.0–34.0)
MCHC: 33.5 g/dL (ref 30.0–36.0)
MCV: 94.3 fL (ref 80.0–100.0)
Platelets: 163 10*3/uL (ref 150–400)
RBC: 2.82 MIL/uL — ABNORMAL LOW (ref 4.22–5.81)
RDW: 11.9 % (ref 11.5–15.5)
WBC: 8.4 10*3/uL (ref 4.0–10.5)
nRBC: 0 % (ref 0.0–0.2)

## 2020-04-09 LAB — GLUCOSE, CAPILLARY
Glucose-Capillary: 127 mg/dL — ABNORMAL HIGH (ref 70–99)
Glucose-Capillary: 164 mg/dL — ABNORMAL HIGH (ref 70–99)
Glucose-Capillary: 118 mg/dL — ABNORMAL HIGH (ref 70–99)
Glucose-Capillary: 178 mg/dL — ABNORMAL HIGH (ref 70–99)

## 2020-04-09 NOTE — Plan of Care (Signed)

## 2020-04-09 NOTE — Progress Notes (Signed)
      [address]       [place] [address]             [phone]        3 Days Post-Op Procedure(s) (LRB): CORONARY ARTERY BYPASS GRAFTING (CABG), ON PUMP, TIMES FOUR, USING BILATERAL INTERNAL MAMMARY ARTERIES AND HARVESTED LEFT RADIAL ARTERY (OPEN) (N/A) RADIAL ARTERY HARVEST (Left) TRANSESOPHAGEAL ECHOCARDIOGRAM (TEE) (N/A) INDOCYANINE GREEN FLUORESCENCE IMAGING (ICG) (N/A)  Subjective: Patient eating breakfast. He has had some loose stools over the weekend. Patient with numbness top of thumb.  Objective: Vital signs in last 24 hours: Temp:  [98.2 F (36.8 C)-99 F (37.2 C)] 98.6 F (37 C) (08/23 0300) Pulse Rate:  [72-104] 72 (08/23 0300) Cardiac Rhythm: Normal sinus rhythm (08/23 0300) Resp:  [0-29] 21 (08/23 0300) BP: (114-162)/(65-96) 121/65 (08/23 0300) SpO2:  [90 %-97 %] 91 % (08/23 0300) Weight:  [86.8 kg] 86.8 kg (08/23 0443)  Pre op weight 88 kg Current Weight  04/09/20 86.8 kg      Intake/Output from previous day: 08/22 0701 - 08/23 0700 In: 1160 [P.O.:1160] Out: 2460 [Urine:2390; Chest Tube:70]   Physical Exam:  Cardiovascular: RRR Pulmonary: Slightly diminished bibasilar breath sounds Abdomen: Soft, non tender, bowel sounds present. Extremities: Mild bilateral lower extremity edema. Motor/sensory intact except left thumb tip has numbness Wounds: LUE wound is clean and dry.  No erythema or signs of infection.  Lab Results: CBC: Recent Labs    04/08/20 0456 04/09/20 0030  WBC 8.0 8.4  HGB 9.1* 8.9*  HCT 27.4* 26.6*  PLT 115* 163   BMET:  Recent Labs    04/07/20 1740 04/08/20 0456  NA 134* 137  K 4.2 4.1  CL 102 104  CO2 21* 24  GLUCOSE 252* 161*  BUN 9 10  CREATININE 0.86 0.77  CALCIUM 8.4* 8.6*    PT/INR:  Lab Results  Component Value Date   INR 1.4 (H) 04/06/2020   INR 1.1 04/04/2020   INR 1.1 02/28/2020   ABG:  INR: Will add last result for INR, ABG once components are confirmed Will add last 4  CBG results once components are confirmed  Assessment/Plan:  1. CV - Previous a fib with RVR. Maintaining SR. On Amiodarone 400 mg bid, Lopressor 12.5 mg bid, Irbesartan 37.5 mg daily, Isordil 10 mg tid 2.  Pulmonary - On room air. CXR this am appears to show cardiomegaly, bibasilar atelectasis/effusions, no pneumothorax. Encourage incentive spirometer 3. Volume Overload - On Lasix 40 mg daily 4.  Expected post op acute blood loss anemia - H and H this am slightly decreased to 8.9 and 26.6 5. DM-CBGs 150/148/127. On Insulin. Pre op HGA1C 8.2. He will need close medical follow up after discharge. 6. Stop stool softeners 7. Hopefully, home Wed or Thursday  Elian Gloster M ZimmermanPA-C 04/09/2020,7:28 AM

## 2020-04-09 NOTE — Discharge Instructions (Signed)

## 2020-04-09 NOTE — Discharge Summary (Addendum)
Physician Discharge Summary       [address]       [place], [address]             [phone]    Patient ID: Richard Hatfield MRN: [id number] DOB/AGE: October 05, 1955 64 y.o.  Admit date: 04/06/2020 Discharge date: 04/11/2020  Admission Diagnoses: Coronary artery disease  Discharge Diagnoses:  1. S/P CABG x 4 2. Expected post op blood loss anemia 3. Post op atrial fibrillation with RVR 4. History of Diabetes mellitus without complication ([place]) 5. History of hypertension 6. History of out of hospital cardiac arrest and MI  Consults: None  Procedure (s):  TRANSESOPHAGEAL ECHOCARDIOGRAM (TEE), CORONARY ARTERY BYPASS GRAFTING (CABG) x 4 (LIMA to LAD, LEFT RADIAL ARTERY HARVEST SEQUENTIALLY DIAGONAL and OM, RIMA to PLB) USING BILATERAL INTERNAL MAMMARY ARTERIES AND OPEN  LEFT RADIAL ARTERY HARVEST, and INDOCYANINE GREEN FLUORESCENCE IMAGING (ICG) by Dr. Orvan Seen on 04/06/2020.  History of Presenting Illness: This is a 64 year old man was in his usual state of health until he experienced cardiac arrest at home and for around 02/28/2020.  He underwent 20 minutes of CPR and was ultimately fortunate to experience return of spontaneous circulation.  He was taken to [facility] where he underwent urgent left heart catheterization demonstrating severe multivessel coronary artery disease without clear culprit lesion.  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  He was discharged from the hospital to inpatient rehab and continue to improve.  At this point he is considered to be back to baseline in regards to his cognitive and physical conditioning.  He presents as an outpatient for consideration of coronary artery bypass grafting. Dr. Orvan Seen discussed potential risks, benefits, and complications of the surgery with the patient and he agreed to proceed with surgery. Pre operative carotid duplex US showed no  significant internal carotid artery stenosis bilaterally. Patient underwent a CABG x 4 on 04/06/2020.  Brief Hospital Course:  Patient was extubated the evening of surgery. He remained afebrile and hemodynamically stable. Gordy Councilman, a line, chest tubes, and foley were removed early in the post operative course. He was weaned off Nitro drip (for radial artery harvest) and started on Isordil. Lopressor was started and titrated accordingly. He was volume over loaded and diuresed. He had ABL anemia. He did not require a post op transfusion. Last H and H was  8.9 and 26.6. He was weaned off the insulin drip.  Once he was tolerating a diet, home diabetic medicines were restarted.  The patient's glucose remained well controlled.The patient's HGA1C pre op was 8.2. He will need close medical follow up after discharge.  The patient was felt surgically stable for transfer from the ICU to PCTU for further convalescence on 08/22. He went into a fib with RVR the evening of 08/22. He was given Lopressor and put on Amiodarone. He converted and remained in sinus rhythm. Per Dr. Orvan Seen, will restart Plavix and baby ecasa. He continues to progress with cardiac rehab. He was ambulating on room air. He has been tolerating a diet and has had a bowel movement. Prevena sternal wound VAC was removed on 08/25. Sternal wound is clean and dry. Epicardial pacing wires were removed on 04/11/2020. Chest tube sutures will be removed the day of discharge. As discussed with Dr. Orvan Seen, the patient is felt surgically stable for discharge today.    Latest Vital Signs: Blood pressure 138/70, pulse 80, temperature  98.8 F (37.1 C), temperature source Oral, resp. rate (!) 26, height 5\' 9"  (1.753 m), weight 85 kg, SpO2 90 %.  Physical Exam: Cardiovascular: RRR Pulmonary: Mostly clear Abdomen: Soft, non tender, bowel sounds present. Extremities: Trace bilateral lower extremity edema. Motor/sensory intact except left thumb tip has  numbness Wounds: LUE wound has a very small, slight sero sanguinous drainage from the mid forearm  No erythema or signs of infection. Prevena wound vac removed this am. Sternal wound is mostly clean and dry  Discharge Condition: Stable and discharged to home.  Recent laboratory studies:  Lab Results  Component Value Date   WBC 8.4 04/09/2020   HGB 8.9 (L) 04/09/2020   HCT 26.6 (L) 04/09/2020   MCV 94.3 04/09/2020   PLT 163 04/09/2020   Lab Results  Component Value Date   NA 137 04/08/2020   K 4.1 04/08/2020   CL 104 04/08/2020   CO2 24 04/08/2020   CREATININE 0.77 04/08/2020   GLUCOSE 161 (H) 04/08/2020    Diagnostic Studies: DG Chest 2 View  Result Date: 04/09/2020 CLINICAL DATA:  64 year old male status post chest tube removal. EXAM: CHEST - 2 VIEW COMPARISON:  Chest x-ray 04/08/2020. FINDINGS: Previously noted bilateral chest tubes have been removed. Previously noted right IJ Cordis has also been removed. Lung volumes are slightly low. Bibasilar opacities (left greater than right), compatible with areas of atelectasis and/or scarring. No definite consolidative airspace disease. Small bilateral pleural effusions. No pneumothorax. No evidence of pulmonary edema. Mild enlargement of the cardiopericardial silhouette. Status post median sternotomy for CABG. Epicardial pacing wires are again noted. IMPRESSION: 1. Postoperative changes and support apparatus, as above. 2. No definite pneumothorax following removal of bilateral chest tubes. 3. Low lung volumes with persistent bibasilar (left greater than right) areas of atelectasis and/or scarring. Electronically Signed   By: Vinnie Langton M.D.   On: 04/09/2020 07:57   DG Chest 2 View  Result Date: 04/04/2020 CLINICAL DATA:  Preop.  CABG. EXAM: CHEST - 2 VIEW COMPARISON:  03/04/2020 FINDINGS: Midline trachea. Borderline cardiomegaly. No pleural effusion or pneumothorax. Resolution of interstitial edema. No lobar consolidation. IMPRESSION:  No acute cardiopulmonary disease. Electronically Signed   By: Abigail Miyamoto M.D.   On: 04/04/2020 16:37   DG Chest Port 1 View  Result Date: 04/08/2020 CLINICAL DATA:  Status post CABG.  Follow-up. EXAM: PORTABLE CHEST 1 VIEW COMPARISON:  April 07, 2020 FINDINGS: The PA catheter is been removed. A right IJ sheath remains. Bilateral chest tubes remain in place. Increased atelectasis in the right base. Stable atelectasis in the left base. No pneumothorax. The lungs are otherwise clear. The cardiomediastinal silhouette is stable. IMPRESSION: 1. Support apparatus as above. 2. Mild increased atelectasis in the right base. Stable atelectasis in the left base. Electronically Signed   By: Dorise Bullion III M.D   On: 04/08/2020 08:29   DG Chest Port 1 View  Result Date: 04/07/2020 CLINICAL DATA:  64 year old male with history of pneumothorax. EXAM: PORTABLE CHEST 1 VIEW COMPARISON:  Chest x-ray 04/06/2020. FINDINGS: Patient has been extubated. Right internal jugular Cordis through which a Swan-Ganz catheter has been passed into the proximal right pulmonary artery. Bilateral chest tubes are in position with tips projecting over the mid to lower thorax bilaterally. Additional mediastinal or pericardial drains are noted. Status post median sternotomy. Epicardial pacing wires are also present. Lung volumes are low. Bibasilar opacities most compatible with resolving areas of postoperative subsegmental atelectasis. No pneumothorax. No acute consolidative airspace disease. No  pleural effusions. No evidence of pulmonary edema. Mild cardiomegaly. Upper mediastinal contours are within normal limits. IMPRESSION: 1. Postoperative changes and support apparatus, as above. 2. Low lung volumes with resolving postoperative subsegmental atelectasis. 3. Mild cardiomegaly. Electronically Signed   By: Vinnie Langton M.D.   On: 04/07/2020 07:14   DG Chest Port 1 View  Result Date: 04/06/2020 CLINICAL DATA:  Hypoxia EXAM: PORTABLE  CHEST 1 VIEW COMPARISON:  April 04, 2020 FINDINGS: Patient is status post coronary artery bypass grafting. Endotracheal tube tip is 4.2 cm above the carina. Nasogastric tube tip and side port are below the diaphragm. Swan-Ganz catheter tip is in the main pulmonary outflow tract directed toward the right. There is a chest tube on each side and a mediastinal drain. There are temporary pacemaker wires attached to the right heart. No pneumothorax. There is bibasilar atelectasis with minimal pleural effusions bilaterally. Lungs elsewhere clear. Heart size and pulmonary vascularity are within normal limits. No adenopathy. No bone lesions. IMPRESSION: Tube and catheter positions as described without pneumothorax. Bibasilar atelectasis. No consolidation. Cardiac silhouette essentially stable except for postoperative changes. Electronically Signed   By: Lowella Grip III M.D.   On: 04/06/2020 14:44   ECHO INTRAOPERATIVE TEE  Result Date: 04/06/2020  *INTRAOPERATIVE TRANSESOPHAGEAL REPORT *  Patient Name:   ROBEN SCHLIEP [facility] Date of Exam: 04/06/2020 Medical Rec #:  [id number]       Height:       69.0 in Accession #:    [id number]      Weight:       194.9 lb Date of Birth:  1955-12-22       BSA:          2.04 m Patient Age:    28 years        BP:           143/74 mmHg Patient Gender: M               HR:           55 bpm. Exam Location:  Inpatient Transesophogeal exam was perform intraoperatively during surgical procedure. Patient was closely monitored under general anesthesia during the entirety of examination. Indications:     R07.2 Precordial pain; I25.110 Atherosclerotic heart disease of                  native coronary artery with unstable angina pectoris Performing Phys: [id number] Ahmed Prima Z ATKINS Diagnosing Phys: Suzette Battiest MD Complications: No known complications during this procedure. POST-OP IMPRESSIONS - Left Ventricle: The left ventricle is unchanged from pre-bypass. - Right Ventricle: The right ventricle  appears unchanged from pre-bypass. - Aorta: The aorta appears unchanged from pre-bypass. - Left Atrium: The left atrium appears unchanged from pre-bypass. - Left Atrial Appendage: The left atrial appendage appears unchanged from pre-bypass. - Aortic Valve: The aortic valve appears unchanged from pre-bypass. - Mitral Valve: The mitral valve appears unchanged from pre-bypass. - Tricuspid Valve: The tricuspid valve appears unchanged from pre-bypass. - Interatrial Septum: The interatrial septum appears unchanged from pre-bypass. - Interventricular Septum: The interventricular septum appears unchanged from pre-bypass. - Pericardium: The pericardium appears unchanged from pre-bypass. PRE-OP FINDINGS  Left Ventricle: The left ventricle has normal systolic function, with an ejection fraction of 55-60%. The cavity size was normal. There is mild concentric left ventricular hypertrophy. Right Ventricle: The right ventricle has normal systolic function. The cavity was normal. There is no increase in right ventricular wall thickness. Left Atrium: Left atrial size was normal  in size. Right Atrium: Right atrial size was normal in size. Right atrial pressure is estimated at 10 mmHg. Interatrial Septum: Probable small PFO identifited by color doppler with left to right shunting. Pericardium: There is no evidence of pericardial effusion. Mitral Valve: The mitral valve is normal in structure. Mitral valve regurgitation is not visualized by color flow Doppler. Tricuspid Valve: The tricuspid valve was normal in structure. Tricuspid valve regurgitation is trivial by color flow Doppler. Aortic Valve: The aortic valve is tricuspid There is moderate thickening of the aortic valve and There is mild calcification of the aortic valve Aortic valve regurgitation was not visualized by color flow Doppler. There is mild stenosis of the aortic valve. Pulmonic Valve: The pulmonic valve was normal in structure. Pulmonic valve regurgitation is trivial  by color flow Doppler. Aorta: The aortic root, ascending aorta and aortic arch are normal in size and structure. +--------------+--------++ LEFT VENTRICLE         +--------------+--------++ PLAX 2D                +--------------+--------++ LVOT diam:    2.10 cm  +--------------+--------++ LVOT Area:    3.46 cm +--------------+--------++                        +--------------+--------++ +-------------+------------++ AORTIC VALVE              +-------------+------------++ AV Vmax:     198.00 cm/s  +-------------+------------++ AV Vmean:    131.500 cm/s +-------------+------------++ AV VTI:      0.422 m      +-------------+------------++ AV Peak Grad:15.7 mmHg    +-------------+------------++ AV Mean Grad:8.5 mmHg     +-------------+------------++  +--------------+-------+ SHUNTS                +--------------+-------+ Systemic Diam:2.10 cm +--------------+-------+  Suzette Battiest MD Electronically signed by Suzette Battiest MD Signature Date/Time: 04/06/2020/2:03:41 PM    Final    VAS US DOPPLER PRE CABG  Result Date: 04/04/2020 PREOPERATIVE VASCULAR EVALUATION  Indications:      Pre-CABG. Risk Factors:     Hypertension, Diabetes, coronary artery disease. Comparison Study: no prior Performing Technologist: Abram Sander RVS  Examination Guidelines: A complete evaluation includes B-mode imaging, spectral Doppler, color Doppler, and power Doppler as needed of all accessible portions of each vessel. Bilateral testing is considered an integral part of a complete examination. Limited examinations for reoccurring indications may be performed as noted.  Right Carotid Findings: +----------+--------+--------+--------+------------+--------+           PSV cm/sEDV cm/sStenosisDescribe    Comments +----------+--------+--------+--------+------------+--------+ CCA Prox  123     13              heterogenous          +----------+--------+--------+--------+------------+--------+ CCA Distal80      13              heterogenous         +----------+--------+--------+--------+------------+--------+ ICA Prox  63      18      1-39%   heterogenous         +----------+--------+--------+--------+------------+--------+ ICA Distal70      23                                   +----------+--------+--------+--------+------------+--------+ ECA       81                                           +----------+--------+--------+--------+------------+--------+  Portions of this table do not appear on this page. +----------+--------+-------+--------+------------+           PSV cm/sEDV cmsDescribeArm Pressure +----------+--------+-------+--------+------------+ Subclavian156                                 +----------+--------+-------+--------+------------+ +---------+--------+--+--------+--+---------+ VertebralPSV cm/s46EDV cm/s12Antegrade +---------+--------+--+--------+--+---------+ Left Carotid Findings: +----------+--------+--------+--------+------------+--------+           PSV cm/sEDV cm/sStenosisDescribe    Comments +----------+--------+--------+--------+------------+--------+ CCA Prox  130     23              heterogenous         +----------+--------+--------+--------+------------+--------+ CCA Distal79      15              heterogenous         +----------+--------+--------+--------+------------+--------+ ICA Prox  77      28      1-39%   heterogenous         +----------+--------+--------+--------+------------+--------+ ICA Distal65      18                                   +----------+--------+--------+--------+------------+--------+ ECA       102                                          +----------+--------+--------+--------+------------+--------+ +----------+--------+--------+--------+------------+ SubclavianPSV cm/sEDV cm/sDescribeArm Pressure  +----------+--------+--------+--------+------------+           143                                  +----------+--------+--------+--------+------------+ +---------+--------+--+--------+--+---------+ VertebralPSV cm/s49EDV cm/s16Antegrade +---------+--------+--+--------+--+---------+  ABI Findings: +--------+------------------+-----+---------+--------+ Right   Rt Pressure (mmHg)IndexWaveform Comment  +--------+------------------+-----+---------+--------+ Brachial140                    triphasic         +--------+------------------+-----+---------+--------+ PTA     161               1.04 triphasic         +--------+------------------+-----+---------+--------+ DP      123               0.79 triphasic         +--------+------------------+-----+---------+--------+ +--------+------------------+-----+---------+-------+ Left    Lt Pressure (mmHg)IndexWaveform Comment +--------+------------------+-----+---------+-------+ [id number]                    triphasic        +--------+------------------+-----+---------+-------+ PTA     174               1.12 triphasic        +--------+------------------+-----+---------+-------+ DP      141               0.91 triphasic        +--------+------------------+-----+---------+-------+ +-------+---------------+----------------+ ABI/TBIToday's ABI/TBIPrevious ABI/TBI +-------+---------------+----------------+ Right  1.04                            +-------+---------------+----------------+ Left   1.12                            +-------+---------------+----------------+  Right Doppler Findings: +--------+--------+-----+---------+--------+ Site    PressureIndexDoppler  Comments +--------+--------+-----+---------+--------+ [id number]          triphasic         +--------+--------+-----+---------+--------+ Radial               triphasic         +--------+--------+-----+---------+--------+ Ulnar                 triphasic         +--------+--------+-----+---------+--------+  Left Doppler Findings: +--------+--------+-----+---------+--------+ Site    PressureIndexDoppler  Comments +--------+--------+-----+---------+--------+ [id number]          triphasic         +--------+--------+-----+---------+--------+ Radial               triphasic         +--------+--------+-----+---------+--------+ Ulnar                triphasic         +--------+--------+-----+---------+--------+  Summary: Right Carotid: Velocities in the right ICA are consistent with a 1-39% stenosis. Left Carotid: Velocities in the left ICA are consistent with a 1-39% stenosis. Vertebrals: Bilateral vertebral arteries demonstrate antegrade flow. Right ABI: Resting right ankle-brachial index is within normal range. No evidence of significant right lower extremity arterial disease. Left ABI: Resting left ankle-brachial index is within normal range. No evidence of significant left lower extremity arterial disease. Right Upper Extremity: Doppler waveforms decrease 50% with right radial compression. Doppler waveforms decrease 50% with right ulnar compression. Left Upper Extremity: Doppler waveforms decrease 50% with left radial compression. Doppler waveforms remain within normal limits with left ulnar compression.  Electronically signed by Curt Jews MD on 04/04/2020 at 5:05:58 PM.    Final      Discharge Medications: Allergies as of 04/11/2020   No Known Allergies     Medication List    STOP taking these medications   amLODipine 5 MG tablet Commonly known as: NORVASC     TAKE these medications   acetaminophen 325 MG tablet Commonly known as: TYLENOL Take 1-2 tablets (325-650 mg total) by mouth every 4 (four) hours as needed for mild pain.   amiodarone 200 MG tablet Commonly known as: PACERONE Take 1 tablet (200 mg total) by mouth 2 (two) times daily. For one week;then take 200 mg daily thereafter   aspirin 81  MG chewable tablet Chew 1 tablet (81 mg total) by mouth daily.   calcium carbonate 500 MG chewable tablet Commonly known as: TUMS - dosed in mg elemental calcium Chew 1 tablet (200 mg of elemental calcium total) by mouth 2 (two) times daily. What changed: when to take this   clopidogrel 75 MG tablet Commonly known as: PLAVIX Take 1 tablet (75 mg total) by mouth daily.   empagliflozin 25 MG Tabs tablet Commonly known as: Jardiance Take 1 tablet (25 mg total) by mouth daily.   furosemide 40 MG tablet Commonly known as: LASIX Take 1 tablet (40 mg total) by mouth daily. For 5 days then stop.   insulin aspart 100 UNIT/ML injection Commonly known as: novoLOG Inject 8 Units into the skin 3 (three) times daily before meals.   insulin degludec 100 UNIT/ML FlexTouch Pen Commonly known as: TRESIBA Inject 0.18 mLs (18 Units total) into the skin 2 (two) times daily.   irbesartan 75 MG tablet Commonly known as: AVAPRO Take 0.5 tablets (37.5 mg total) by mouth daily.   isosorbide dinitrate 10 MG tablet Commonly known as: ISORDIL Take 1 tablet (  10 mg total) by mouth 3 (three) times daily. For one month then stop.   metoprolol tartrate 25 MG tablet Commonly known as: LOPRESSOR Take 0.5 tablets (12.5 mg total) by mouth 2 (two) times daily. What changed: how much to take   multivitamin with minerals Tabs tablet Take 1 tablet by mouth daily.   pantoprazole 20 MG tablet Commonly known as: PROTONIX Take 1 tablet (20 mg total) by mouth daily.   polyethylene glycol 17 g packet Commonly known as: MIRALAX / GLYCOLAX Take 17 g by mouth 2 (two) times daily. What changed:   when to take this  reasons to take this   potassium chloride SA 20 MEQ tablet Commonly known as: KLOR-CON Take 1 tablet (20 mEq total) by mouth daily. For 5 days then stop.   rosuvastatin 40 MG tablet Commonly known as: CRESTOR Take 1 tablet (40 mg total) by mouth daily.   traMADol 50 MG tablet Commonly known  as: ULTRAM Take 1 tablet (50 mg total) by mouth every 6 (six) hours as needed for moderate pain.   traZODone 50 MG tablet Commonly known as: DESYREL Take 0.5-1 tablets (25-50 mg total) by mouth at bedtime as needed for sleep.      The patient has been discharged on:   1.Beta Blocker:  Yes [ x  ]                              No   [   ]                              If No, reason:  2.Ace Inhibitor/ARB: Yes [ x  ]                                     No  [    ]                                     If No, reason:  3.Statin:   Yes [ x  ]                  No  [   ]                  If No, reason:  4.Ecasa:  Yes  [ x  ]                  No   [   ]                  If No, reason:  Follow Up Appointments:  Follow-up Information    Wonda Olds, MD. Go on 04/16/2020.   Specialty: Cardiothoracic Surgery Why: Appointment time is at 4:30 pm Contact information: [address] [place] [address] [place] Follow up.   Why: Please obtain a medical doctor for further diabetes management and surveillance of HGA1C 8.2       Cleaver, Jossie Ng, NP. Go on 04/24/2020.   Specialty: Cardiology Why: Appointment time is at 9:15 am Contact information: [address] [place] [address] [phone]  SignedSharalyn Ink ZimmermanPA-C 04/11/2020, 9:19 AM

## 2020-04-09 NOTE — Anesthesia Postprocedure Evaluation (Signed)
Anesthesia Post Note  Patient: Richard Hatfield  Procedure(s) Performed: CORONARY ARTERY BYPASS GRAFTING (CABG), ON PUMP, TIMES FOUR, USING BILATERAL INTERNAL MAMMARY ARTERIES AND HARVESTED LEFT RADIAL ARTERY (OPEN) (N/A Chest) RADIAL ARTERY HARVEST (Left Arm Lower) TRANSESOPHAGEAL ECHOCARDIOGRAM (TEE) (N/A ) INDOCYANINE GREEN FLUORESCENCE IMAGING (ICG) (N/A )     Patient location during evaluation: SICU Anesthesia Type: General Level of consciousness: sedated Pain management: pain level controlled Vital Signs Assessment: post-procedure vital signs reviewed and stable Respiratory status: patient remains intubated per anesthesia plan Cardiovascular status: stable Postop Assessment: no apparent nausea or vomiting Anesthetic complications: no   No complications documented.  Last Vitals:  Vitals:   04/09/20 1156 04/09/20 1557  BP: 117/71 118/75  Pulse: 78 78  Resp: 16 (!) 23  Temp: 36.6 C 36.6 C  SpO2:      Last Pain:  Vitals:   04/09/20 1156  TempSrc: Oral  PainSc: 3                  Tiajuana Amass

## 2020-04-09 NOTE — Progress Notes (Signed)
CARDIAC REHAB PHASE I   PRE:  Rate/Rhythm: 80 SR    BP: sitting 138/89    SaO2: 94 RA  MODE:  Ambulation: 580 ft   POST:  Rate/Rhythm: 92 SR    BP: sitting 141/92     SaO2: 98 RA  Pt tolerated fairly well. Needed verbal reminders at times for sternal precautions and mobility. Used RW, assist x1. Slow and steady pace. Enjoyed walk, denied fatigue. VSS. To recliner. Encouraged x2 more walks and IS (800 mL). [place], ACSM 04/09/2020 9:50 AM

## 2020-04-09 NOTE — Op Note (Signed)
CARDIOTHORACIC SURGERY OPERATIVE NOTE  Date of Procedure: 04/06/20 Preoperative Diagnosis: Severe 3-vessel Coronary Artery Disease  Postoperative Diagnosis: Same  Procedure:    Coronary Artery Bypass Grafting x 4  Left Internal Mammary Artery to Distal Left Anterior Descending Coronary Artery, pedicled right internal mammary artery to posterolateral branch of the right coronary Artery; left radial artery graft to obtuse Marginal Branch of Left Circumflex Coronary Artery and diagonal Branch Coronary Artery as a sequenced graft Completion graft surveillance with indocyanine green fluorescence angiography Open left radial harvesting Bilateral internal mammary artery harvesting Application of Prevena incisional management system Multilevel rib block with Exparel solution  Surgeon: B.  Murvin Natal, MD  Assistant: Josie Saunders, PA-C  Anesthesia: General endotracheal  Operative Findings:  Preserved left ventricular systolic function  Good quality internal mammary artery conduits  Good quality radial artery conduit  Good quality target vessels for grafting    BRIEF CLINICAL NOTE AND INDICATIONS FOR SURGERY  65 year old gentleman without prior medical history sustained at home coronary arrest 1 month prior.  He underwent 20 minutes of CPR and had return of spontaneous circulation.  He underwent urgent left heart catheterization demonstrating multivessel coronary artery disease but no interventions were performed.  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  He now presents for CABG to address his underlying coronary disease and reduce his coronary mortality.   DETAILS OF THE OPERATIVE PROCEDURE  Preparation:  The patient is brought to the operating room on the above mentioned date and central monitoring was established by the anesthesia team including placement of Swan-Ganz catheter and radial arterial line. The patient is placed in the supine  position on the operating table.  Intravenous antibiotics are administered. General endotracheal anesthesia is induced uneventfully. A Foley catheter is placed.  Baseline transesophageal echocardiogram was performed.  Findings were notable for preserved left ventricular function and no significant valvular disease  The patient's chest, abdomen, left upper extremity, both groins, and both lower extremities are prepared and draped in a sterile manner. A time out procedure is performed.   Surgical Approach and Conduit Harvest:  A median sternotomy incision was performed and the left internal mammary artery is dissected from the chest wall and prepared for bypass grafting. The left internal mammary artery is notably good quality conduit. Simultaneously, open harvesting of the left radial artery is performed with harmonic scalpel.  Once completed the forearm incision is repaired in layers and retucked at the side.  Attention was turned to the right hemithorax where the right internal mammary artery and its pedicle are mobilized in a standard fashion.  Following systemic heparinization, the internal mammary artery pedicles were transected distally noted to have excellent flow.  Both were treated with a solution of papaverine.  Multilevel rib block was undertaken by injecting Exparel into the observable intercostal spaces.   Extracorporeal Cardiopulmonary Bypass and Myocardial Protection:  The pericardium is opened. The ascending aorta is nondiseased in appearance. The ascending aorta and the right atrium are cannulated for cardiopulmonary bypass.  Adequate heparinization is verified.     Cardiopulmonary bypass was begun and the surface of the heart is inspected. Distal target vessels are selected for coronary artery bypass grafting. A cardioplegia cannula is placed in the ascending aorta.   The patient is allowed to cool passively to dirty forC systemic temperature.  The aortic cross clamp is applied  and cold blood cardioplegia is delivered initially in an antegrade fashion through the aortic root. Iced saline  slush is applied for topical hypothermia.  The initial cardioplegic arrest is rapid with early diastolic arrest.  Repeat doses of cardioplegia are administered intermittently throughout the entire cross clamp portion of the operation through the aortic root and through subsequently placed vein grafts in order to maintain completely flat electrocardiogram.  Coronary Artery Bypass Grafting:   The posterolateral branch of the right coronary artery was grafted using the pedicled right internal mammary artery graft in an end-to-side fashion.  At the site of distal anastomosis the target vessel was good quality and measured approximately 1.5 mm in diameter. Anastomotic patency and runoff was confirmed with indocyanine green fluorescence imaging (SPY).  The obtuse marginal branch of the left circumflex coronary artery and diagonal artery were grafted using the left radial artery graft and a sequenced  fashion.  At the site of distal anastomoses the target vessel were good quality and measured approximately 1.5 mm in diameter.   The distal left anterior coronary artery was grafted with the left internal mammary artery in an end-to-side fashion.  At the site of distal anastomosis the target vessel was good quality and measured approximately 1.5 mm in diameter. Anastomotic patency and runoff was confirmed with indocyanine green fluorescence imaging (SPY). The proximal radial artery graft to the aorta was performed prior to removing the aortic cross-clamp.  When completed, de-airing procedures were performed and the aortic cross-clamp was removed   Procedure Completion:  All proximal and distal coronary anastomoses were inspected for hemostasis and appropriate graft orientation. Epicardial pacing wires are fixed to the right ventricular outflow tract and to the right atrial appendage. The patient is  rewarmed to 37C temperature. The patient is weaned and disconnected from cardiopulmonary bypass.  The patient's rhythm at separation from bypass was sinus bradycardia.  The patient was weaned from cardiopulmonary bypass without any inotropic support.   Followup transesophageal echocardiogram performed after separation from bypass revealed no changes from the preoperative exam.  The aortic and venous cannula were removed uneventfully. Protamine was administered to reverse the anticoagulation. The mediastinum and pleural space were inspected for hemostasis and irrigated with saline solution. The mediastinum and bilateral pleural spaces were drained using fluted chest tubes placed through separate stab incisions inferiorly.  The soft tissues anterior to the aorta were reapproximated loosely. The sternum is closed with double strength sternal wire. The soft tissues anterior to the sternum were closed in multiple layers and the skin is closed with a running subcuticular skin closure.  The post-bypass portion of the operation was notable for stable rhythm and hemodynamics.  No blood products were administered during the operation.   Disposition:  The patient tolerated the procedure well and is transported to the surgical intensive care in stable condition. There are no intraoperative complications. All sponge instrument and needle counts are verified correct at completion of the operation.    Jayme Cloud, MD 04/09/2020 10:29 AM

## 2020-04-10 ENCOUNTER — Encounter (HOSPITAL_COMMUNITY): Payer: Self-pay | Admitting: Cardiothoracic Surgery

## 2020-04-10 LAB — GLUCOSE, CAPILLARY
Glucose-Capillary: 106 mg/dL — ABNORMAL HIGH (ref 70–99)
Glucose-Capillary: 227 mg/dL — ABNORMAL HIGH (ref 70–99)
Glucose-Capillary: 143 mg/dL — ABNORMAL HIGH (ref 70–99)
Glucose-Capillary: 174 mg/dL — ABNORMAL HIGH (ref 70–99)

## 2020-04-10 MED ORDER — ISOSORBIDE DINITRATE 10 MG PO TABS
10.0000 mg | ORAL_TABLET | Freq: Three times a day (TID) | ORAL | 0 refills | Status: DC
Start: 2020-04-10 — End: 2022-06-05

## 2020-04-10 MED ORDER — AMIODARONE HCL 200 MG PO TABS: 200.0000 mg | ORAL_TABLET | Freq: Two times a day (BID) | ORAL | 1 refills | Status: DC

## 2020-04-10 MED ORDER — CLOPIDOGREL BISULFATE 75 MG PO TABS: 75.0000 mg | ORAL_TABLET | Freq: Every day | ORAL | 0 refills | Status: AC

## 2020-04-10 NOTE — Addendum Note (Signed)
Addendum  created 04/10/20 0720 by Sammie Bench, CRNA   Order list changed

## 2020-04-10 NOTE — Progress Notes (Addendum)
      [address]       [place] [address]             [phone]        4 Days Post-Op Procedure(s) (LRB): CORONARY ARTERY BYPASS GRAFTING (CABG), ON PUMP, TIMES FOUR, USING BILATERAL INTERNAL MAMMARY ARTERIES AND HARVESTED LEFT RADIAL ARTERY (OPEN) (N/A) RADIAL ARTERY HARVEST (Left) TRANSESOPHAGEAL ECHOCARDIOGRAM (TEE) (N/A) INDOCYANINE GREEN FLUORESCENCE IMAGING (ICG) (N/A)  Subjective: Patient just waking up. He has no specific complaint this am.  Objective: Vital signs in last 24 hours: Temp:  [97.6 F (36.4 C)-99.1 F (37.3 C)] 97.9 F (36.6 C) (08/24 0347) Pulse Rate:  [71-90] 73 (08/24 0347) Cardiac Rhythm: Normal sinus rhythm (08/24 0400) Resp:  [16-23] 22 (08/24 0347) BP: (110-141)/(69-92) 135/76 (08/24 0347) SpO2:  [94 %-95 %] 95 % (08/24 0347) Weight:  [84.8 kg] 84.8 kg (08/24 0500)  Pre op weight 88 kg Current Weight  04/10/20 84.8 kg      Intake/Output from previous day: 08/23 0701 - 08/24 0700 In: 3 [I.V.:3] Out: 2100 [Urine:2100]   Physical Exam:  Cardiovascular: RRR Pulmonary: Slightly diminished bibasilar breath sounds Abdomen: Soft, non tender, bowel sounds present. Extremities: Mild bilateral lower extremity edema. Motor/sensory intact except left thumb tip has numbness Wounds: LUE wound has slight sero sanguinous drainage from the proximal and mid areas.  No erythema or signs of infection. Prevena wound vac in place on sternum.  Lab Results: CBC: Recent Labs    04/08/20 0456 04/09/20 0030  WBC 8.0 8.4  HGB 9.1* 8.9*  HCT 27.4* 26.6*  PLT 115* 163   BMET:  Recent Labs    04/07/20 1740 04/08/20 0456  NA 134* 137  K 4.2 4.1  CL 102 104  CO2 21* 24  GLUCOSE 252* 161*  BUN 9 10  CREATININE 0.86 0.77  CALCIUM 8.4* 8.6*    PT/INR:  Lab Results  Component Value Date   INR 1.4 (H) 04/06/2020   INR 1.1 04/04/2020   INR 1.1 02/28/2020   ABG:  INR: Will add last result for INR, ABG once components are  confirmed Will add last 4 CBG results once components are confirmed  Assessment/Plan:  1. CV - Previous a fib with RVR. Maintaining SR. On Amiodarone 400 mg bid, Lopressor 12.5 mg bid, Irbesartan 37.5 mg daily, Isordil 10 mg tid. Will decrease Amiodarone and ec asa and restart Plavix at discharge. 2.  Pulmonary - On room air.  Encourage incentive spirometer 3. Volume Overload - On Lasix 40 mg daily 4.  Expected post op acute blood loss anemia - H and H this am slightly decreased to 8.9 and 26.6 5. DM-CBGs 118/178/106. On Insulin. Pre op HGA1C 8.2. He will need close medical follow up after discharge. 6. Will remove Prevena wound VAC in am. Hopefully, home in am  Fawaz Borquez M ZimmermanPA-C 04/10/2020,7:15 AM

## 2020-04-10 NOTE — Progress Notes (Signed)
CARDIAC REHAB PHASE I   PRE:  Rate/Rhythm: 79 SR    BP: sitting 125/70    SaO2: 97 RA  MODE:  Ambulation: 750 ft   POST:  Rate/Rhythm: 84 SR    BP: sitting 125/69     SaO2: 99 RA  Pt up in recliner, feeling well. Hadn't ambulated hall since yesterday am, has been walking to BR fairly independently. Able to stand with verbal reminders for sternum and ambulate hall independently, standby assist. Slow and steady pace. No c/o. Wife present and supportive. He can walk with her later. Left ed materials and will return tomorrow at 1100 for education. [place], ACSM 04/10/2020 2:33 PM

## 2020-04-11 ENCOUNTER — Ambulatory Visit: Payer: BC Managed Care – PPO | Admitting: General Practice

## 2020-04-11 LAB — GLUCOSE, CAPILLARY
Glucose-Capillary: 116 mg/dL — ABNORMAL HIGH (ref 70–99)
Glucose-Capillary: 163 mg/dL — ABNORMAL HIGH (ref 70–99)

## 2020-04-11 MED ORDER — POTASSIUM CHLORIDE CRYS ER 20 MEQ PO TBCR: 20.0000 meq | EXTENDED_RELEASE_TABLET | Freq: Every day | ORAL | 0 refills | Status: DC

## 2020-04-11 MED ORDER — TRAMADOL HCL 50 MG PO TABS
50.0000 mg | ORAL_TABLET | Freq: Four times a day (QID) | ORAL | 0 refills | Status: DC | PRN
Start: 2020-04-11 — End: 2021-11-11

## 2020-04-11 MED ORDER — FUROSEMIDE 40 MG PO TABS
40.0000 mg | ORAL_TABLET | Freq: Every day | ORAL | 0 refills | Status: DC
Start: 2020-04-11 — End: 2020-04-16

## 2020-04-11 MED ORDER — METOPROLOL TARTRATE 25 MG PO TABS: 12.5000 mg | ORAL_TABLET | Freq: Two times a day (BID) | ORAL | 1 refills | Status: AC

## 2020-04-11 MED FILL — Albumin, Human Inj 5%: INTRAVENOUS | Qty: 250 | Status: AC

## 2020-04-11 MED FILL — Heparin Sodium (Porcine) Inj 1000 Unit/ML: INTRAMUSCULAR | Qty: 30 | Status: AC

## 2020-04-11 MED FILL — Potassium Chloride Inj 2 mEq/ML: INTRAVENOUS | Qty: 40 | Status: AC

## 2020-04-11 MED FILL — Calcium Chloride Inj 10%: INTRAVENOUS | Qty: 10 | Status: AC

## 2020-04-11 MED FILL — Sodium Chloride IV Soln 0.9%: INTRAVENOUS | Qty: 4000 | Status: AC

## 2020-04-11 MED FILL — Sodium Bicarbonate IV Soln 8.4%: INTRAVENOUS | Qty: 50 | Status: AC

## 2020-04-11 MED FILL — Mannitol IV Soln 20%: INTRAVENOUS | Qty: 500 | Status: AC

## 2020-04-11 MED FILL — Magnesium Sulfate Inj 50%: INTRAMUSCULAR | Qty: 10 | Status: AC

## 2020-04-11 MED FILL — Heparin Sodium (Porcine) Inj 1000 Unit/ML: INTRAMUSCULAR | Qty: 20 | Status: AC

## 2020-04-11 MED FILL — Electrolyte-R (PH 7.4) Solution: INTRAVENOUS | Qty: 6000 | Status: AC

## 2020-04-11 NOTE — Progress Notes (Signed)
      [address]       [place] [address]             [phone]        5 Days Post-Op Procedure(s) (LRB): CORONARY ARTERY BYPASS GRAFTING (CABG), ON PUMP, TIMES FOUR, USING BILATERAL INTERNAL MAMMARY ARTERIES AND HARVESTED LEFT RADIAL ARTERY (OPEN) (N/A) RADIAL ARTERY HARVEST (Left) TRANSESOPHAGEAL ECHOCARDIOGRAM (TEE) (N/A) INDOCYANINE GREEN FLUORESCENCE IMAGING (ICG) (N/A)  Subjective: Patient's only complaint is a small area on tip of thumb numb (has been post op)  Objective: Vital signs in last 24 hours: Temp:  [97.8 F (36.6 C)-98.6 F (37 C)] 98.1 F (36.7 C) (08/25 0405) Pulse Rate:  [71-79] 71 (08/25 0405) Cardiac Rhythm: Normal sinus rhythm (08/25 0400) Resp:  [12-25] 18 (08/25 0405) BP: (120-146)/(75-83) 128/83 (08/25 0405) SpO2:  [92 %-94 %] 92 % (08/24 2000) Weight:  [85 kg] 85 kg (08/25 0405)  Pre op weight 88 kg Current Weight  04/11/20 85 kg      Intake/Output from previous day: 08/24 0701 - 08/25 0700 In: -  Out: 300 [Urine:300]   Physical Exam:  Cardiovascular: RRR Pulmonary: Mostly clear Abdomen: Soft, non tender, bowel sounds present. Extremities: Trace bilateral lower extremity edema. Motor/sensory intact except left thumb tip has numbness Wounds: LUE wound has a very small, slight sero sanguinous drainage from the mid forearm  No erythema or signs of infection. Prevena wound vac removed this am. Sternal wound is mostly clean and dry  Lab Results: CBC: Recent Labs    04/09/20 0030  WBC 8.4  HGB 8.9*  HCT 26.6*  PLT 163   BMET:  No results for input(s): NA, K, CL, CO2, GLUCOSE, BUN, CREATININE, CALCIUM in the last 72 hours.  PT/INR:  Lab Results  Component Value Date   INR 1.4 (H) 04/06/2020   INR 1.1 04/04/2020   INR 1.1 02/28/2020   ABG:  INR: Will add last result for INR, ABG once components are confirmed Will add last 4 CBG results once components are confirmed  Assessment/Plan:  1. CV - Previous  a fib with RVR. Maintaining SR. On Amiodarone 400 mg bid, Lopressor 12.5 mg bid, Irbesartan 37.5 mg daily, Isordil 10 mg tid. Will decrease Amiodarone and ec asa and restart Plavix at discharge. 2.  Pulmonary - On room air.  Encourage incentive spirometer 3. Volume Overload - On Lasix 40 mg daily 4.  Expected post op acute blood loss anemia - Last H and H  8.9 and 26.6 5. DM-CBGs 143/174/116. On Insulin. Pre op HGA1C 8.2. He will need close medical follow up after discharge. 6. Remove EPW 7. Discharge  Shraga Custard M ZimmermanPA-C 04/11/2020,7:10 AM

## 2020-04-11 NOTE — Progress Notes (Signed)
External epicardial pacing wires removed with NO complications. Chest tube sutures removed. Sites painted with betadine. CDI. Pt on bedrest until 0930, VS Q 15 mins x 4. Initial BP stable 154/74 (96). Bedside RN (Chat) will continue to monitor. Plan for DC after 3 hours of monitoring post pull.

## 2020-04-11 NOTE — Progress Notes (Signed)
All set for discharge home, discharge instructions given to wife and pt. Wife to come back and pick him up after MD appointment.

## 2020-04-11 NOTE — Progress Notes (Signed)
Assisted pt getting out of bed with bed flat and appropriate mechanics. To recliner. Discussed IS, sternal precautions, diet, exercise, and CRPII with pt and wife. Good reception. Will refer to [place].  [place]ck, ACSM 12:07 PM 04/11/2020

## 2020-04-12 ENCOUNTER — Other Ambulatory Visit: Payer: Self-pay | Admitting: Physical Medicine and Rehabilitation

## 2020-04-13 ENCOUNTER — Telehealth (HOSPITAL_COMMUNITY): Payer: Self-pay

## 2020-04-13 ENCOUNTER — Other Ambulatory Visit: Payer: Self-pay | Admitting: Cardiothoracic Surgery

## 2020-04-13 DIAGNOSIS — Z951 Presence of aortocoronary bypass graft: Secondary | ICD-10-CM

## 2020-04-13 NOTE — Telephone Encounter (Signed)
Attempted to call patient in regards to Cardiac Rehab - LM on VM 

## 2020-04-13 NOTE — Telephone Encounter (Signed)
Pt insurance is active and benefits verified through [facility]. Co-pay $0.00, DED $1,250.00/$1,250.00 met, out of pocket $4,890.00/$4,890.00 met, co-insurance 20%. No pre-authorization required. Passport, 04/13/20 @ 2:15PM, FSE#[id number]  Will contact patient to see if he is interested in the Cardiac Rehab Program. If interested, patient will need to complete follow up appt. Once completed, patient will be contacted for scheduling upon review by the RN Navigator.

## 2020-04-16 ENCOUNTER — Ambulatory Visit (INDEPENDENT_AMBULATORY_CARE_PROVIDER_SITE_OTHER): Payer: Self-pay | Admitting: Cardiothoracic Surgery

## 2020-04-16 ENCOUNTER — Ambulatory Visit
Admission: RE | Admit: 2020-04-16 | Discharge: 2020-04-16 | Disposition: A | Discharge status: Home / Self Care | Payer: BC Managed Care – PPO | Source: Ambulatory Visit | Attending: Cardiothoracic Surgery | Admitting: Cardiothoracic Surgery

## 2020-04-16 ENCOUNTER — Other Ambulatory Visit: Payer: Self-pay

## 2020-04-16 VITALS — BP 116/73 | HR 84 | Resp 20 | Ht 68.0 in | Wt 182.0 lb

## 2020-04-16 DIAGNOSIS — I2510 Atherosclerotic heart disease of native coronary artery without angina pectoris: Secondary | ICD-10-CM

## 2020-04-16 DIAGNOSIS — Z951 Presence of aortocoronary bypass graft: Secondary | ICD-10-CM

## 2020-04-22 NOTE — Progress Notes (Signed)
[address]       [place] [address]             [place] OFFICE NOTE  Referring Provider is Troy Sine, MD Primary Cardiologist is Shelva Majestic, MD PCP is System, Pcp Not In   HPI:  64 year old man presents for first outpatient visit status post CABG.  He is doing well since returning home.  He denies chest pain.  He denies fevers and chills.  Current Outpatient Medications  Medication Sig Dispense Refill  . acetaminophen (TYLENOL) 325 MG tablet Take 1-2 tablets (325-650 mg total) by mouth every 4 (four) hours as needed for mild pain.    Marland Kitchen amiodarone (PACERONE) 200 MG tablet Take 1 tablet (200 mg total) by mouth 2 (two) times daily. For one week;then take 200 mg daily thereafter 60 tablet 1  . aspirin 81 MG chewable tablet Chew 1 tablet (81 mg total) by mouth daily.    . calcium carbonate (TUMS - DOSED IN MG ELEMENTAL CALCIUM) 500 MG chewable tablet Chew 1 tablet (200 mg of elemental calcium total) by mouth 2 (two) times daily. (Patient taking differently: Chew 1 tablet by mouth 3 (three) times daily. ) 60 tablet 0  . clopidogrel (PLAVIX) 75 MG tablet Take 1 tablet (75 mg total) by mouth daily. 30 tablet 0  . empagliflozin (JARDIANCE) 25 MG TABS tablet Take 1 tablet (25 mg total) by mouth daily. 30 tablet 0  . insulin aspart (NOVOLOG) 100 UNIT/ML injection Inject 8 Units into the skin 3 (three) times daily before meals.    . insulin degludec (TRESIBA) 100 UNIT/ML FlexTouch Pen Inject 0.18 mLs (18 Units total) into the skin 2 (two) times daily.    . irbesartan (AVAPRO) 75 MG tablet Take 0.5 tablets (37.5 mg total) by mouth daily. 30 tablet 0  . isosorbide dinitrate (ISORDIL) 10 MG tablet Take 1 tablet (10 mg total) by mouth 3 (three) times daily. For one month then stop. 90 tablet 0  . metoprolol tartrate (LOPRESSOR) 25 MG tablet Take 0.5 tablets (12.5 mg total) by mouth 2 (two) times daily. 30 tablet 1  . Multiple Vitamin  (MULTIVITAMIN WITH MINERALS) TABS tablet Take 1 tablet by mouth daily.    . pantoprazole (PROTONIX) 20 MG tablet Take 1 tablet (20 mg total) by mouth daily. 30 tablet 0  . polyethylene glycol (MIRALAX / GLYCOLAX) 17 g packet Take 17 g by mouth 2 (two) times daily. (Patient taking differently: Take 17 g by mouth daily as needed for moderate constipation. ) 60 each 0  . rosuvastatin (CRESTOR) 40 MG tablet Take 1 tablet (40 mg total) by mouth daily. 30 tablet 0  . traMADol (ULTRAM) 50 MG tablet Take 1 tablet (50 mg total) by mouth every 6 (six) hours as needed for moderate pain. 30 tablet 0  . traZODone (DESYREL) 50 MG tablet Take 0.5-1 tablets (25-50 mg total) by mouth at bedtime as needed for sleep. 10 tablet 0   No current facility-administered medications for this visit.      Physical Exam:   BP 116/73   Pulse 84   Resp 20   Ht 5\' 8"  (1.727 m)   Wt 82.6 kg   SpO2 93% Comment: RA  BMI 27.67 kg/m   General:  Well-appearing no acute distress  Chest:   Clear to auscultation  CV:   Regular rate and rhythm  Incisions:  Healing well; staples are removed  from the left forearm incision and the wound is Steri-Stripped  Abdomen:  Soft nontender  Extremities:  No edema  Diagnostic Tests:  Chest x-ray with clear lung fields   Impression:  Doing well after CABG  Plan: Follow-up in 2 weeks for wound check. Continue to observe sternal precautions No changes to medication regimen  I spent in excess of 20 minutes during the conduct of this office consultation and >50% of this time involved direct face-to-face encounter with the patient for counseling and/or coordination of their care.  Level 2                 10 minutes Level 3                 15 minutes Level 4                 25 minutes Level 5                 40 minutes  B.  Murvin Natal, MD 04/22/2020 11:32 AM

## 2020-04-23 NOTE — Progress Notes (Signed)
Cardiology Clinic Note   Patient Name: Richard Hatfield Date of Encounter: 04/24/2020  Primary Care Provider:  System, Pcp Not In Primary Cardiologist:  Shelva Majestic, MD  Patient Profile    Richard Hatfield 64 year old male presents to the clinic today for follow-up evaluation of his coronary artery disease status post CABG x4 04/06/20  Past Medical History    Past Medical History:  Diagnosis Date  . Cardiac arrest ([place]) 02/28/2020  . Coronary artery disease 02/2020   pt admitted for STEMI in 02/2020 (per pt)  . Diabetes mellitus without complication ([place])   . Hypertension   . Myocardial infarction [facility])    per pt he had a heart attack earlier (02/2020) - see encounters   Past Surgical History:  Procedure Laterality Date  . CORONARY ARTERY BYPASS GRAFT N/A 04/06/2020   Procedure: CORONARY ARTERY BYPASS GRAFTING (CABG), ON PUMP, TIMES FOUR, USING BILATERAL INTERNAL MAMMARY ARTERIES AND HARVESTED LEFT RADIAL ARTERY (OPEN);  Surgeon: Wonda Olds, MD;  Location: [place];  Service: Open Heart Surgery;  Laterality: N/A;  . LEFT HEART CATH AND CORONARY ANGIOGRAPHY N/A 02/28/2020   Procedure: LEFT HEART CATH AND CORONARY ANGIOGRAPHY;  Surgeon: Troy Sine, MD;  Location: [place] [facility];  Service: Cardiovascular;  Laterality: N/A;  . RADIAL ARTERY HARVEST Left 04/06/2020   Procedure: RADIAL ARTERY HARVEST;  Surgeon: Wonda Olds, MD;  Location: [place];  Service: Open Heart Surgery;  Laterality: Left;  . TEE WITHOUT CARDIOVERSION N/A 04/06/2020   Procedure: TRANSESOPHAGEAL ECHOCARDIOGRAM (TEE);  Surgeon: Wonda Olds, MD;  Location: [place];  Service: Open Heart Surgery;  Laterality: N/A;    Allergies  No Known Allergies  History of Present Illness    Mr. Brigham has a past medical history of cardiac arrest, ACS, coronary artery disease, type 2 diabetes, acute encephalopathy, anoxic brain injury, AKI, transaminitis, CABG x4 on 04/06/2020  He presented to the hospital  after unexpected cardiac arrest at home 02/28/2020.  He received 20 minutes of CPR with return of ROSC.  He was transported to [facility] and underwent emergent cardiac catheterization which showed severe multivessel coronary artery disease.  He was noted to have suffered significant anoxic brain injury.  He was discharged to inpatient rehab and had significant improvement.  He returned to his baseline with his cognitive and physical conditioning.  He followed up with Dr. Orvan Seen to discuss CABG.  He eventually underwent CABG x4 on 04/06/2020.  He was discharged on 04/11/2020 in stable condition.  He followed up with Dr. Orvan Seen on 04/16/2020 he was doing well at that time, his sternal precautions were continued, CXR showed no acute changes, and he denied chest pain.  He presents the clinic today for follow-up evaluation and states he feels well. He continues to slowly increase physical activity at home. He has been helping his wife with light housework tasks such as sweeping, and watering plants. 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 He has not had any side effects with his medication and has not noticed any palpitations. He has been arranging holiday photos and doing active memory type tasks. He does not notice any memory impairment at this time. I will give him the salty 6 diet sheet and have him follow-up in 3 months.  Today he denies chest pain,  shortness of breath, lower extremity edema, fatigue, palpitations, melena, hematuria, hemoptysis, diaphoresis, weakness, presyncope, syncope, orthopnea, and PND.   Home Medications    Prior to Admission medications   Medication Sig Start Date End Date Taking? Authorizing Provider  acetaminophen (TYLENOL) 325 MG tablet Take 1-2 tablets (325-650 mg total) by mouth every 4 (four) hours as needed  for mild pain. 03/20/20   Love, Ivan Anchors, PA-C  amiodarone (PACERONE) 200 MG tablet Take 1 tablet (200 mg total) by mouth 2 (two) times daily. For one week;then take 200 mg daily thereafter 04/10/20   Nani Skillern, PA-C  aspirin 81 MG chewable tablet Chew 1 tablet (81 mg total) by mouth daily. 03/09/20   Alma Friendly, MD  calcium carbonate (TUMS - DOSED IN MG ELEMENTAL CALCIUM) 500 MG chewable tablet Chew 1 tablet (200 mg of elemental calcium total) by mouth 2 (two) times daily. Patient taking differently: Chew 1 tablet by mouth 3 (three) times daily.  03/20/20   Love, Ivan Anchors, PA-C  clopidogrel (PLAVIX) 75 MG tablet Take 1 tablet (75 mg total) by mouth daily. 04/10/20   Nani Skillern, PA-C  empagliflozin (JARDIANCE) 25 MG TABS tablet Take 1 tablet (25 mg total) by mouth daily. 03/20/20   Love, Ivan Anchors, PA-C  insulin aspart (NOVOLOG) 100 UNIT/ML injection Inject 8 Units into the skin 3 (three) times daily before meals.    [provider]  insulin degludec (TRESIBA) 100 UNIT/ML FlexTouch Pen Inject 0.18 mLs (18 Units total) into the skin 2 (two) times daily. 03/20/20   Love, Ivan Anchors, PA-C  irbesartan (AVAPRO) 75 MG tablet Take 0.5 tablets (37.5 mg total) by mouth daily. 03/20/20   Love, Ivan Anchors, PA-C  isosorbide dinitrate (ISORDIL) 10 MG tablet Take 1 tablet (10 mg total) by mouth 3 (three) times daily. For one month then stop. 04/10/20   Nani Skillern, PA-C  metoprolol tartrate (LOPRESSOR) 25 MG tablet Take 0.5 tablets (12.5 mg total) by mouth 2 (two) times daily. 04/11/20   Nani Skillern, PA-C  Multiple Vitamin (MULTIVITAMIN WITH MINERALS) TABS tablet Take 1 tablet by mouth daily. 03/20/20   Love, Ivan Anchors, PA-C  pantoprazole (PROTONIX) 20 MG tablet Take 1 tablet (20 mg total) by mouth daily. 03/20/20   Love, Ivan Anchors, PA-C  polyethylene glycol (MIRALAX / GLYCOLAX) 17 g packet Take 17 g by mouth 2 (two) times daily. Patient taking differently: Take 17 g by mouth  daily as needed for moderate constipation.  03/20/20   Love, Ivan Anchors, PA-C  rosuvastatin (CRESTOR) 40 MG tablet Take 1 tablet (40 mg total) by mouth daily. 03/20/20   Love, Ivan Anchors, PA-C  traMADol (ULTRAM) 50 MG tablet Take 1 tablet (50 mg total) by mouth every 6 (six) hours as needed for moderate pain. 04/11/20   Nani Skillern, PA-C  traZODone (DESYREL) 50 MG tablet Take 0.5-1 tablets (25-50 mg total) by mouth at bedtime as needed for sleep. 03/20/20   LoveIvan Anchors, PA-C    Family History    No family history on file. has no family status information on file.   Social History    Social History   Socioeconomic History  . Marital status: Married    Spouse name: Quince Santana  . Number of children: Not on file  . Years of education: Not on file  . Highest education level: Not on file  Occupational History  . Not on file  Tobacco Use  . Smoking status: Never  Smoker  . Smokeless tobacco: Never Used  Vaping Use  . Vaping Use: Never used  Substance and Sexual Activity  . Alcohol use: Not Currently  . Drug use: Never  . Sexual activity: Not Currently    Partners: Female  Other Topics Concern  . Not on file  Social History Narrative  . Not on file   Social Determinants of Health   Financial Resource Strain:   . Difficulty of Paying Living Expenses: Not on file  Food Insecurity:   . Worried About Charity fundraiser in the Last Year: Not on file  . Ran Out of Food in the Last Year: Not on file  Transportation Needs:   . Lack of Transportation (Medical): Not on file  . Lack of Transportation (Non-Medical): Not on file  Physical Activity:   . Days of Exercise per Week: Not on file  . Minutes of Exercise per Session: Not on file  Stress:   . Feeling of Stress : Not on file  Social Connections:   . Frequency of Communication with Friends and Family: Not on file  . Frequency of Social Gatherings with Friends and Family: Not on file  . Attends Religious Services: Not on  file  . Active Member of Clubs or Organizations: Not on file  . Attends Archivist Meetings: Not on file  . Marital Status: Not on file  Intimate Partner Violence:   . Fear of Current or Ex-Partner: Not on file  . Emotionally Abused: Not on file  . Physically Abused: Not on file  . Sexually Abused: Not on file    Will do hypothermia therapy on hold still falling for quite a while several days and so it it worked with you it sounds like over 1-6 9 for being on the receiving end of it is not like a pleasant that you get the breathing tube and you get these cold packs on felt a heart attack however is-**and they are in the hospital in the intensive care LVEF is a 2 days 2 days Review of Systems    General:  No chills, fever, night sweats or weight changes.  Cardiovascular:  No chest pain, dyspnea on exertion, edema, orthopnea, palpitations, paroxysmal nocturnal dyspnea. Dermatological: No rash, lesions/masses Respiratory: No cough, dyspnea Urologic: No hematuria, dysuria Abdominal:   No nausea, vomiting, diarrhea, bright red blood per rectum, melena, or hematemesis Neurologic:  No visual changes, wkns, changes in mental status. All other systems reviewed and are otherwise negative except as noted above.  Physical Exam    VS:  BP 110/73   Pulse 65   Temp 97.7 F (36.5 C)   Ht 5\' 9"  (1.753 m)   Wt 185 lb (83.9 kg)   SpO2 96%   BMI 27.32 kg/m  , BMI Body mass index is 27.32 kg/m. GEN: Well nourished, well developed, in no acute distress. HEENT: normal. Neck: Supple, no JVD, carotid bruits, or masses. Cardiac: RRR, no murmurs, rubs, or gallops. No clubbing, cyanosis, edema.  Radials/DP/ 2+ and equal bilaterally.  Respiratory:  Respirations regular and unlabored, clear to auscultation bilaterally. GI: Soft, nontender, nondistended, BS + x 4. MS: no deformity or atrophy. Skin: warm and dry, no rash. Neuro:  Strength and sensation are intact. Psych: Normal  affect.  Accessory Clinical Findings    Recent Labs: 03/03/2020: TSH 2.920 04/04/2020: ALT 43 04/07/2020: Magnesium 1.9 04/08/2020: BUN 10; Creatinine, Ser 0.77; Potassium 4.1; Sodium 137 04/09/2020: Hemoglobin 8.9; Platelets 163   Recent Lipid  Panel    Component Value Date/Time   CHOL 272 (H) 02/28/2020 0126   TRIG 158 (H) 03/02/2020 0445   HDL NOT REPORTED DUE TO HIGH TRIGLYCERIDES 02/28/2020 0126   CHOLHDL NOT REPORTED DUE TO HIGH TRIGLYCERIDES 02/28/2020 0126   VLDL UNABLE TO CALCULATE IF TRIGLYCERIDE OVER 400 mg/dL 02/28/2020 0126   LDLCALC UNABLE TO CALCULATE IF TRIGLYCERIDE OVER 400 mg/dL 02/28/2020 0126   LDLDIRECT 61.9 02/28/2020 0126    ECG personally reviewed by me today-normal sinus rhythm T wave abnormality consider anterior lateral ischemia 65 bpm- No acute changes  Echocardiogram 02/28/2020 IMPRESSIONS    1. Normal wall motion in visualized segments. Mid to distal anterolateral  wall not well visualized on apical images. Consider repeat with echo  contrast for wall motion when patient less agitated.. Left ventricular  ejection fraction, by estimation, is 50  to 55%. The left ventricle has low normal function. The left ventricle  has no regional wall motion abnormalities. There is mild concentric left  ventricular hypertrophy. Left ventricular diastolic parameters were  normal.  2. Right ventricular systolic function is normal. The right ventricular  size is normal. There is mildly elevated pulmonary artery systolic  pressure.  3. Left atrial size was mildly dilated.  4. The mitral valve is normal in structure. Trivial mitral valve  regurgitation. No evidence of mitral stenosis.  5. The aortic valve has an indeterminant number of cusps. Aortic valve  regurgitation is not visualized. Mild aortic valve stenosis.   Comparison(s): No prior Echocardiogram.   Assessment & Plan   1.  Coronary artery disease/status post CABG x4-no chest pain today.  Continue  sternal precautions and is slowly increasing his physical activity. Continue aspirin, Plavix, Jardiance, Heart healthy low-sodium diet-salty 6 given Increase physical activity as tolerated   Postoperative atrial fibrillation-EKG today normal sinus rhythm T wave abnormality consider anteriolateral ischemia 65 bpm.  No further episodes of atrial fibrillation or activity intolerance. Continue amiodarone, metoprolol Avoid triggers caffeine, chocolate, EtOH etc. Heart healthy low-sodium diet-salty 6 given Increase physical activity as tolerated  Essential hypertension-BP today 110/73. Well-controlled at home Continue irbesartan, metoprolol, Heart healthy low-sodium diet-salty 6 given Increase physical activity as tolerated  Hyperlipidemia-02/28/2020: Cholesterol 272; HDL NOT REPORTED DUE TO HIGH TRIGLYCERIDES; LDL Cholesterol UNABLE TO CALCULATE IF TRIGLYCERIDE OVER 400 mg/dL; VLDL UNABLE TO CALCULATE IF TRIGLYCERIDE OVER 400 mg/dL 03/02/2020: Triglycerides 158 Continue rosuvastatin Heart healthy low-sodium high-fiber diet Continue sternal precautions and increase physical activity as tolerated  Type 2 diabetes-blood glucose 161 on 04/08/2020. Continue Jardiance Heart healthy low-sodium carb modified diet Increase physical activity as tolerated  Disposition: Follow-up with Dr. Claiborne Billings in 3 months.  Jossie Ng. Servando Kyllonen NP-C    04/24/2020, 8:55 AM [address] Office [phone] Fax [phone]  Notice: This dictation was prepared with Dragon dictation along with smaller phrase technology. Any transcriptional errors that result from this process are unintentional and may not be corrected upon review.

## 2020-04-24 ENCOUNTER — Encounter: Payer: Self-pay | Admitting: General Practice

## 2020-04-24 ENCOUNTER — Other Ambulatory Visit: Payer: Self-pay

## 2020-04-24 ENCOUNTER — Ambulatory Visit: Payer: BC Managed Care – PPO | Admitting: General Practice

## 2020-04-24 VITALS — BP 110/73 | HR 65 | Temp 97.7°F | Ht 69.0 in | Wt 185.0 lb

## 2020-04-24 DIAGNOSIS — E1165 Type 2 diabetes mellitus with hyperglycemia: Secondary | ICD-10-CM

## 2020-04-24 DIAGNOSIS — I10 Essential (primary) hypertension: Secondary | ICD-10-CM

## 2020-04-24 DIAGNOSIS — E7800 Pure hypercholesterolemia, unspecified: Secondary | ICD-10-CM | POA: Diagnosis not present

## 2020-04-24 DIAGNOSIS — I4891 Unspecified atrial fibrillation: Secondary | ICD-10-CM

## 2020-04-24 DIAGNOSIS — I2510 Atherosclerotic heart disease of native coronary artery without angina pectoris: Secondary | ICD-10-CM

## 2020-04-24 NOTE — Patient Instructions (Signed)
Medication Instructions:  No Changes In Medications at this time.  *If you need a refill on your cardiac medications before your next appointment, please call your pharmacy*  Lab Work: None Ordered At This Time.  If you have labs (blood work) drawn today and your tests are completely normal, you will receive your results only by: Marland Kitchen MyChart Message (if you have MyChart) OR . A paper copy in the mail If you have any lab test that is abnormal or we need to change your treatment, we will call you to review the results.  Testing/Procedures: None Ordered At This Time.  Follow-Up: At [facility], you and your health needs are our priority.  As part of our continuing mission to provide you with exceptional heart care, we have created designated Provider Care Teams.  These Care Teams include your primary Cardiologist (physician) and Advanced Practice Providers (APPs -  Physician Assistants and Nurse Practitioners) who all work together to provide you with the care you need, when you need it.  We recommend signing up for the patient portal called "MyChart".  Sign up information is provided on this After Visit Summary.  MyChart is used to connect with patients for Virtual Visits (Telemedicine).  Patients are able to view lab/test results, encounter notes, upcoming appointments, etc.  Non-urgent messages can be sent to your provider as well.   To learn more about what you can do with MyChart, go to [email or web address].    Your next appointment:   3 month(s)  The format for your next appointment:   In Person  Provider:   Shelva Majestic, MD   Other Instructions SALTY SIX PAPER GIVEN

## 2020-04-30 ENCOUNTER — Other Ambulatory Visit: Payer: Self-pay

## 2020-04-30 ENCOUNTER — Ambulatory Visit: Payer: Self-pay | Admitting: Cardiothoracic Surgery

## 2020-04-30 VITALS — BP 129/88 | HR 88 | Temp 97.6°F | Resp 20 | Ht 69.0 in | Wt 185.0 lb

## 2020-04-30 DIAGNOSIS — Z951 Presence of aortocoronary bypass graft: Secondary | ICD-10-CM

## 2020-04-30 DIAGNOSIS — I2510 Atherosclerotic heart disease of native coronary artery without angina pectoris: Secondary | ICD-10-CM

## 2020-05-03 ENCOUNTER — Other Ambulatory Visit: Payer: Self-pay | Admitting: Physician Assistant

## 2020-05-06 ENCOUNTER — Other Ambulatory Visit: Payer: Self-pay | Admitting: Physician Assistant

## 2020-05-07 NOTE — Progress Notes (Signed)
The patient is seen in second postoperative visit status post CABG.  He reports no difficulties with chest pain or shortness of breath.  His activity level is improving. He is considering going back to work at the end of October  Physical exam: Well-appearing no acute distress Clear to auscultation Regular rate and rhythm No edema  Imaging: No new tests  Impression and plan doing well after CABG; okay to drive now  Agree with return to work at the end of October  Quince Santana Z. Orvan Seen, [place]

## 2020-05-10 ENCOUNTER — Encounter (HOSPITAL_COMMUNITY): Payer: Self-pay

## 2020-05-10 ENCOUNTER — Telehealth (HOSPITAL_COMMUNITY): Payer: Self-pay

## 2020-05-10 ENCOUNTER — Ambulatory Visit: Payer: BC Managed Care – PPO | Admitting: Neurology

## 2020-05-10 NOTE — Telephone Encounter (Signed)
Attempted to call patient in regards to Cardiac Rehab - LM on VM Mailed letter 

## 2020-06-01 ENCOUNTER — Telehealth (HOSPITAL_COMMUNITY): Payer: Self-pay

## 2020-06-01 NOTE — Telephone Encounter (Signed)
Unable to reach pt in regards to Cardiac Rehab Closed referral 

## 2020-06-12 ENCOUNTER — Other Ambulatory Visit: Payer: Self-pay

## 2020-06-15 ENCOUNTER — Telehealth: Payer: Self-pay

## 2020-06-15 NOTE — Telephone Encounter (Signed)
[employer] Return to work form completed and faxed to [employer]. Return to work date full-time with no limitations/restrictions on  07/02/2020. Form mailed to home address

## 2020-06-19 ENCOUNTER — Other Ambulatory Visit: Payer: Self-pay | Admitting: Physician Assistant

## 2020-06-20 ENCOUNTER — Other Ambulatory Visit: Payer: Self-pay | Admitting: Physician Assistant

## 2020-06-21 ENCOUNTER — Ambulatory Visit: Payer: BC Managed Care – PPO | Attending: Internal Medicine

## 2020-06-21 ENCOUNTER — Ambulatory Visit: Payer: BC Managed Care – PPO

## 2020-06-21 DIAGNOSIS — Z23 Encounter for immunization: Secondary | ICD-10-CM

## 2020-06-21 NOTE — Progress Notes (Signed)
   Covid-19 Vaccination Clinic  Name:  Richard Hatfield    MRN: [id number] DOB: August 24, 1955  06/21/2020  Richard Hatfield was observed post Covid-19 immunization for 15 minutes without incident. He was provided with Vaccine Information Sheet and instruction to access the V-Safe system.   Richard Hatfield was instructed to call 911 with any severe reactions post vaccine: Marland Kitchen Difficulty breathing  . Swelling of face and throat  . A fast heartbeat  . A bad rash all over body  . Dizziness and weakness

## 2020-06-25 ENCOUNTER — Other Ambulatory Visit: Payer: Self-pay | Admitting: Cardiovascular Disease

## 2020-06-25 MED ORDER — AMIODARONE HCL 200 MG PO TABS: 200.0000 mg | ORAL_TABLET | Freq: Every day | ORAL | 0 refills | Status: DC

## 2020-06-25 MED ORDER — AMIODARONE HCL 200 MG PO TABS: 200.0000 mg | ORAL_TABLET | Freq: Two times a day (BID) | ORAL | 0 refills | Status: DC

## 2020-06-25 NOTE — Telephone Encounter (Signed)
Refilled Amiodarone per system instructions for 1 tablet (200mg ) daily. Spoke with Marita [place] and clarified [facility] at [facility]. Next appointment with Dr. Claiborne Billings in January.

## 2020-06-25 NOTE — Telephone Encounter (Signed)
*  STAT* If patient is at the pharmacy, call can be transferred to refill team.   1. Which medications need to be refilled? (please list name of each medication and dose if known)  amiodarone (PACERONE) 200 MG tablet  2. Which pharmacy/location (including street and city if local pharmacy) is medication to be sent to?  [id number] [place] [place] - [address]  3. Do they need a 30 day or 90 day supply?  90 day supply   His primary care provided preferred that the cardiologist fill this prescription.

## 2020-06-27 ENCOUNTER — Ambulatory Visit: Payer: BC Managed Care – PPO | Admitting: Physical Medicine & Rehabilitation

## 2020-06-30 ENCOUNTER — Other Ambulatory Visit: Payer: Self-pay | Admitting: Physician Assistant

## 2020-07-14 ENCOUNTER — Other Ambulatory Visit: Payer: Self-pay | Admitting: Cardiovascular Disease

## 2020-07-14 ENCOUNTER — Other Ambulatory Visit: Payer: Self-pay | Admitting: Physical Medicine and Rehabilitation

## 2020-07-19 ENCOUNTER — Telehealth: Payer: Self-pay | Admitting: Cardiovascular Disease

## 2020-07-19 NOTE — Telephone Encounter (Signed)
Called and spoke with patient. He states his BP has been high the last couple of days. He cannot remember what his systolic BP was but he states his diastolic BP was in the 49'P yesterday and it is usually in the 80's. He states he felt a little "queasy" yesterday so he stayed home from work but he feels better today. He denies any chest pain, shortness of breath, palpitations, or headache. He does note blurry vision in one eye for the past month. However, he has already seen his eye doctor for this and has been referred to Ophthalmologist at [facility]. No other stroke like symptoms. He did not seem agitated on the phone. Asked patient to keep BP and HR log for the next 1-2 weeks and then send [place] the readings to see if any medication changes needed to be made. He voiced understanding and agreed. I asked if he wanted me to call and update his wife and he states he would let her know.  Darreld Mclean, PA-C 07/19/2020 11:33 AM

## 2020-07-19 NOTE — Telephone Encounter (Signed)
Pt c/o BP issue: STAT if pt c/o blurred vision, one-sided weakness or slurred speech  1. What are your last 5 BP readings? 140's /90's  2. Are you having any other symptoms (ex. Dizziness, headache, blurred vision, passed out)? Agitation, blurred vision  3. What is your BP issue? Patient's wife states the patient had a heart attack 02/28/2020 and had a bipass surgery. She states his BP has been high and he has been agitated recently. She also states he has blurred vision, but is not with the patient now. She states she is currently at work and the patient is also at work now. She states the patient did not feel well yesterday and missed work, which is not like him. Please advise.

## 2020-08-01 ENCOUNTER — Telehealth: Payer: Self-pay | Admitting: Cardiovascular Disease

## 2020-08-01 NOTE — Telephone Encounter (Signed)
1. What dental office are you calling from? [facility]   2. What is your office phone number? [phone]  3. What is your fax number? [phone]  4. What type of procedure is the patient having performed? Crown   5. What date is procedure scheduled or is the patient there now? TBD  (if the patient is at the dentist's office question goes to their cardiologist if he/she is in the office.  If not, question should go to the DOD).   6. What is your question (ex. Antibiotics prior to procedure, holding medication-we need to know how long dentist wants pt to hold med)? All the above

## 2020-08-01 NOTE — Telephone Encounter (Signed)
   Primary Cardiologist: Shelva Majestic, MD  Chart reviewed as part of pre-operative protocol coverage.   Simple dental extractions and procedures are considered low risk procedures per guidelines and generally do not require any specific cardiac clearance. It is also generally accepted that for simple extractions and dental cleanings, there is no need to interrupt blood thinner therapy.   SBE prophylaxis is not required for the patient from a cardiac standpoint.  I will route this recommendation to the requesting party via Epic fax function and remove from pre-op pool.  Please call with questions.  Tami Lin Jakelin Taussig, PA 08/01/2020, 10:58 AM

## 2020-08-21 ENCOUNTER — Telehealth (HOSPITAL_COMMUNITY): Payer: Self-pay

## 2020-08-21 NOTE — Telephone Encounter (Signed)
Returned pt phone call in regards to CR, adv pt we attempted to contact him twice and mailed a letter. Pt stated he would like to think about participating and he will talk to Dr. Tresa Endo about it at his next appt.

## 2020-09-10 ENCOUNTER — Other Ambulatory Visit: Payer: Self-pay

## 2020-09-10 ENCOUNTER — Encounter: Payer: Self-pay | Admitting: Cardiovascular Disease

## 2020-09-10 ENCOUNTER — Ambulatory Visit: Payer: BC Managed Care – PPO | Admitting: Cardiovascular Disease

## 2020-09-10 DIAGNOSIS — I480 Paroxysmal atrial fibrillation: Secondary | ICD-10-CM

## 2020-09-10 DIAGNOSIS — Z794 Long term (current) use of insulin: Secondary | ICD-10-CM

## 2020-09-10 DIAGNOSIS — I10 Essential (primary) hypertension: Secondary | ICD-10-CM | POA: Diagnosis not present

## 2020-09-10 DIAGNOSIS — E118 Type 2 diabetes mellitus with unspecified complications: Secondary | ICD-10-CM

## 2020-09-10 DIAGNOSIS — I469 Cardiac arrest, cause unspecified: Secondary | ICD-10-CM | POA: Diagnosis not present

## 2020-09-10 DIAGNOSIS — Z951 Presence of aortocoronary bypass graft: Secondary | ICD-10-CM

## 2020-09-10 DIAGNOSIS — I4891 Unspecified atrial fibrillation: Secondary | ICD-10-CM

## 2020-09-10 DIAGNOSIS — E785 Hyperlipidemia, unspecified: Secondary | ICD-10-CM

## 2020-09-10 MED ORDER — AMIODARONE HCL 100 MG PO TABS
100.0000 mg | ORAL_TABLET | Freq: Every day | ORAL | 1 refills | Status: DC
Start: 2020-09-10 — End: 2020-11-20

## 2020-09-10 NOTE — Patient Instructions (Signed)
Medication Instructions:  Decrease amiodarone to 100 mg daily  Increase Irbesartan to 75 mg daily   *If you need a refill on your cardiac medications before your next appointment, please call your pharmacy*   Lab Work: CBC, CMET, TSH, LIPID (you may come back, fasting, no lab appointment needed)  If you have labs (blood work) drawn today and your tests are completely normal, you will receive your results only by: Marland Kitchen MyChart Message (if you have MyChart) OR . A paper copy in the mail If you have any lab test that is abnormal or we need to change your treatment, we will call you to review the results.   Follow-Up: At [facility], you and your health needs are our priority.  As part of our continuing mission to provide you with exceptional heart care, we have created designated Provider Care Teams.  These Care Teams include your primary Cardiologist (physician) and Advanced Practice Providers (APPs -  Physician Assistants and Nurse Practitioners) who all work together to provide you with the care you need, when you need it.  We recommend signing up for the patient portal called "MyChart".  Sign up information is provided on this After Visit Summary.  MyChart is used to connect with patients for Virtual Visits (Telemedicine).  Patients are able to view lab/test results, encounter notes, upcoming appointments, etc.  Non-urgent messages can be sent to your provider as well.   To learn more about what you can do with MyChart, go to [email or web address].    Your next appointment:   Follow up with Coletta Memos, NP in 2 months Follow up with Dr.Kelly in 4 months.

## 2020-09-10 NOTE — Progress Notes (Signed)
Cardiology Office Note    Date:  09/11/2020   ID:  Richard Hatfield, Richard Hatfield July 14, 1956, MRN [id number]   PCP:  Dr.Tisovec Cardiologist:  Shelva Majestic, MD   Initial cardiology office visit with me following his cardiac arrest and CABG revascularization.  History of Present Illness:  Richard Hatfield is a 65 y.o. male who has a history of hypertension, hyperlipidemia, and diabetes mellitus presented to [facility] on February 28, 2020 after developing an out of hospital cardiac arrest witnessed by his wife.  CPR was initiated promptly and received approximately 20 minutes of CPR and 2 rounds of epinephrine and defibrillations by EMS.  Cardiac catheterization revealed significant multivessel CAD with wall motion demonstrating hypocontractility in the anterolateral wall.  He had suffered initial anoxic encephalopathy and following stabilization went to inpatient rehab with significant improvement.  He returned to his baseline cognitive function.  Ultimately he underwent CABG revascularization surgery x4 in April 06, 2020 by Dr. Julien Girt with a LIMA to LAD, left radial to the diagonal and marginal, and RIMA to the posterior lateral branch of the RCA.  Subsequently, he has done well.  He was seen by Coletta Memos on April 24, 2020 for initial office evaluation.  He had developed postoperative atrial fibrillation for which he was treated with amiodarone.  Currently continues to be on amiodarone at 200 mg daily and denies any recurrence of arrhythmia.  He continues to be on aspirin/Plavix.  He is now on rosuvastatin for hyperlipidemia.  He continues also to be on irbesartan 37.5 mg, isosorbide dinitrate 10 mg 3 times a day in addition to 12.5 mg twice a day of metoprolol.  He is diabetic on insulin and Jardiance.  Is only he denies any recurrent anginal symptoms.  He does admit to being tired more easily.  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  July 02, 2020.  He presents for his initial evaluation with me.  Past Medical History:  Diagnosis Date  . Cardiac arrest ([place]) 02/28/2020  . Coronary artery disease 02/2020   pt admitted for STEMI in 02/2020 (per pt)  . Diabetes mellitus without complication ([place])   . Hypertension   . Myocardial infarction [facility])    per pt he had a heart attack earlier (02/2020) - see encounters    Past Surgical History:  Procedure Laterality Date  . CORONARY ARTERY BYPASS GRAFT N/A 04/06/2020   Procedure: CORONARY ARTERY BYPASS GRAFTING (CABG), ON PUMP, TIMES FOUR, USING BILATERAL INTERNAL MAMMARY ARTERIES AND HARVESTED LEFT RADIAL ARTERY (OPEN);  Surgeon: Wonda Olds, MD;  Location: [place];  Service: Open Heart Surgery;  Laterality: N/A;  . LEFT HEART CATH AND CORONARY ANGIOGRAPHY N/A 02/28/2020   Procedure: LEFT HEART CATH AND CORONARY ANGIOGRAPHY;  Surgeon: Troy Sine, MD;  Location: [place] CV LAB;  Service: Cardiovascular;  Laterality: N/A;  . RADIAL ARTERY HARVEST Left 04/06/2020   Procedure: RADIAL ARTERY HARVEST;  Surgeon: Wonda Olds, MD;  Location: [place];  Service: Open Heart Surgery;  Laterality: Left;  . TEE WITHOUT CARDIOVERSION N/A 04/06/2020   Procedure: TRANSESOPHAGEAL ECHOCARDIOGRAM (TEE);  Surgeon: Wonda Olds, MD;  Location: [place];  Service: Open Heart Surgery;  Laterality: N/A;    Current Medications: Outpatient Medications Prior to Visit  Medication Sig Dispense Refill  . acetaminophen (TYLENOL) 325 MG tablet Take 1-2 tablets (325-650 mg total) by mouth every 4 (four)  hours as needed for mild pain.    Marland Kitchen aspirin 81 MG chewable tablet Chew 1 tablet (81 mg total) by mouth daily.    . calcium carbonate (TUMS - DOSED IN MG ELEMENTAL CALCIUM) 500 MG chewable tablet Chew 1 tablet (200 mg of elemental calcium total) by mouth 2 (two) times daily. (Patient taking differently: Chew 1 tablet by mouth 3 (three) times daily.) 60 tablet 0  . clopidogrel  (PLAVIX) 75 MG tablet Take 1 tablet (75 mg total) by mouth daily. 30 tablet 0  . empagliflozin (JARDIANCE) 25 MG TABS tablet Take 1 tablet (25 mg total) by mouth daily. 30 tablet 0  . insulin aspart (NOVOLOG) 100 UNIT/ML injection Inject 8 Units into the skin 3 (three) times daily before meals.    . insulin degludec (TRESIBA) 100 UNIT/ML FlexTouch Pen Inject 0.18 mLs (18 Units total) into the skin 2 (two) times daily.    . irbesartan (AVAPRO) 75 MG tablet Take 0.5 tablets (37.5 mg total) by mouth daily. 30 tablet 0  . isosorbide dinitrate (ISORDIL) 10 MG tablet Take 1 tablet (10 mg total) by mouth 3 (three) times daily. For one month then stop. 90 tablet 0  . metoprolol tartrate (LOPRESSOR) 25 MG tablet Take 0.5 tablets (12.5 mg total) by mouth 2 (two) times daily. 30 tablet 1  . Multiple Vitamin (MULTIVITAMIN WITH MINERALS) TABS tablet Take 1 tablet by mouth daily.    . pantoprazole (PROTONIX) 20 MG tablet Take 1 tablet (20 mg total) by mouth daily. 30 tablet 0  . polyethylene glycol (MIRALAX / GLYCOLAX) 17 g packet Take 17 g by mouth 2 (two) times daily. (Patient taking differently: Take 17 g by mouth daily as needed for moderate constipation.) 60 each 0  . rosuvastatin (CRESTOR) 40 MG tablet Take 1 tablet (40 mg total) by mouth daily. 30 tablet 0  . traMADol (ULTRAM) 50 MG tablet Take 1 tablet (50 mg total) by mouth every 6 (six) hours as needed for moderate pain. 30 tablet 0  . amiodarone (PACERONE) 200 MG tablet TAKE 1 TABLET EVERY DAY 90 tablet 3  . traZODone (DESYREL) 50 MG tablet Take 0.5-1 tablets (25-50 mg total) by mouth at bedtime as needed for sleep. (Patient not taking: Reported on 09/10/2020) 10 tablet 0   No facility-administered medications prior to visit.     Allergies:   Patient has no known allergies.   Social History   Socioeconomic History  . Marital status: Married    Spouse name: Richard Hatfield  . Number of children: Not on file  . Years of education: Not on file  .  Highest education level: Not on file  Occupational History  . Not on file  Tobacco Use  . Smoking status: Never Smoker  . Smokeless tobacco: Never Used  Vaping Use  . Vaping Use: Never used  Substance and Sexual Activity  . Alcohol use: Not Currently  . Drug use: Never  . Sexual activity: Not Currently    Partners: Female  Other Topics Concern  . Not on file  Social History Narrative  . Not on file   Social Determinants of Health   Financial Resource Strain: Not on file  Food Insecurity: Not on file  Transportation Needs: Not on file  Physical Activity: Not on file  Stress: Not on file  Social Connections: Not on file     Family History:  The patient's family history is not on file.   ROS General: Negative; No fevers, chills, or night  sweats;  HEENT: Negative; No changes in vision or hearing, sinus congestion, difficulty swallowing Pulmonary: Negative; No cough, wheezing, shortness of breath, hemoptysis Cardiovascular: Negative; No chest pain, presyncope, syncope, palpitations GI: Negative; No nausea, vomiting, diarrhea, or abdominal pain GU: Negative; No dysuria, hematuria, or difficulty voiding Musculoskeletal: Negative; no myalgias, joint pain, or weakness Hematologic/Oncology: Negative; no easy bruising, bleeding Endocrine: Negative; no heat/cold intolerance; no diabetes Neuro: Negative; no changes in balance, headaches Skin: Negative; No rashes or skin lesions Psychiatric: Negative; No behavioral problems, depression Sleep: Negative; No snoring, daytime sleepiness, hypersomnolence, bruxism, restless legs, hypnogognic hallucinations, no cataplexy Other comprehensive 14 point system review is negative.   PHYSICAL EXAM:   VS:  BP (!) 145/90 (BP Location: Right Arm, Patient Position: Sitting)   Pulse (!) 51   Ht '5\' 9"'  (1.753 m)   Wt 202 lb 6.4 oz (91.8 kg)   SpO2 99%   BMI 29.89 kg/m     Repeat blood pressure by me was 142/86  Wt Readings from Last 3  Encounters:  09/10/20 202 lb 6.4 oz (91.8 kg)  04/30/20 185 lb (83.9 kg)  04/24/20 185 lb (83.9 kg)    General: Alert, oriented, no distress.  Skin: normal turgor, no rashes, warm and dry HEENT: Normocephalic, atraumatic. Pupils equal round and reactive to light; sclera anicteric; extraocular muscles intact;  Nose without nasal septal hypertrophy Mouth/Parynx benign; Mallinpatti scale Neck: No JVD, no carotid bruits; normal carotid upstroke Lungs: clear to ausculatation and percussion; no wheezing or rales Chest wall: without tenderness to palpitation Heart: PMI not displaced, RRR, s1 s2 normal, 1/6 systolic murmur, no diastolic murmur, no rubs, gallops, thrills, or heaves Abdomen: soft, nontender; no hepatosplenomehaly, BS+; abdominal aorta nontender and not dilated by palpation. Back: no CVA tenderness Pulses 2+ Musculoskeletal: full range of motion, normal strength, no joint deformities Extremities: no clubbing cyanosis or edema, Homan's sign negative  Neurologic: grossly nonfocal; Cranial nerves grossly wnl Psychologic: Normal mood and affect   Studies/Labs Reviewed:   EKG:  EKG is ordered today.  ECG (independently read by me): Sinus  Bradycardia at 51,  anterolateral T wave abnormality; QTc 4123 msec  Recent Labs: BMP Latest Ref Rng & Units 04/08/2020 04/07/2020 04/07/2020  Glucose 70 - 99 mg/dL 161(H) 252(H) -  BUN 8 - 23 mg/dL 10 9 -  Creatinine 0.61 - 1.24 mg/dL 0.77 0.86 0.82  Sodium 135 - 145 mmol/L 137 134(L) -  Potassium 3.5 - 5.1 mmol/L 4.1 4.2 -  Chloride 98 - 111 mmol/L 104 102 -  CO2 22 - 32 mmol/L 24 21(L) -  Calcium 8.9 - 10.3 mg/dL 8.6(L) 8.4(L) -     Hepatic Function Latest Ref Rng & Units 04/04/2020 03/12/2020 03/09/2020  Total Protein 6.5 - 8.1 g/dL 7.0 6.5 6.6  Albumin 3.5 - 5.0 g/dL 3.7 3.2(L) 3.1(L)  AST 15 - 41 U/L 33 49(H) 66(H)  ALT 0 - 44 U/L 43 62(H) 65(H)  Alk Phosphatase 38 - 126 U/L 61 112 71  Total Bilirubin 0.3 - 1.2 mg/dL 0.5 0.7 0.7   Bilirubin, Direct 0.0 - 0.2 mg/dL - - -    CBC Latest Ref Rng & Units 04/09/2020 04/08/2020 04/07/2020  WBC 4.0 - 10.5 K/uL 8.4 8.0 9.4  Hemoglobin 13.0 - 17.0 g/dL 8.9(L) 9.1(L) 9.2(L)  Hematocrit 39.0 - 52.0 % 26.6(L) 27.4(L) 28.8(L)  Platelets 150 - 400 K/uL 163 115(L) 145(L)   Lab Results  Component Value Date   MCV 94.3 04/09/2020   MCV 97.2 04/08/2020  MCV 97.6 04/07/2020   Lab Results  Component Value Date   TSH 2.920 03/03/2020   Lab Results  Component Value Date   HGBA1C 8.2 (H) 04/04/2020     BNP No results found for: BNP  ProBNP No results found for: PROBNP   Lipid Panel     Component Value Date/Time   CHOL 272 (H) 02/28/2020 0126   TRIG 158 (H) 03/02/2020 0445   HDL NOT REPORTED DUE TO HIGH TRIGLYCERIDES 02/28/2020 0126   CHOLHDL NOT REPORTED DUE TO HIGH TRIGLYCERIDES 02/28/2020 0126   VLDL UNABLE TO CALCULATE IF TRIGLYCERIDE OVER 400 mg/dL 02/28/2020 0126   LDLCALC UNABLE TO CALCULATE IF TRIGLYCERIDE OVER 400 mg/dL 02/28/2020 0126   LDLDIRECT 61.9 02/28/2020 0126     RADIOLOGY: No results found.   Additional studies/ records that were reviewed today include:     Prox LAD to Mid LAD lesion is 60% stenosed.  1st Diag lesion is 75% stenosed.  Mid LAD lesion is 80% stenosed.  Prox Cx lesion is 70% stenosed.  Prox Cx to Mid Cx lesion is 80% stenosed.  Prox RCA-1 lesion is 20% stenosed.  Prox RCA-2 lesion is 70% stenosed.  RV Branch lesion is 90% stenosed.  Dist RCA-1 lesion is 80% stenosed.  Dist RCA-2 lesion is 85% stenosed.  RPDA lesion is 30% stenosed.  The left ventricular ejection fraction is 45-50% by visual estimate.  LV end diastolic pressure is normal.  There is mild left ventricular systolic dysfunction.   Out of hospital witnessed VF cardiac arrest with return of ROSC after approximately 20 minutes of CPR and administration of 2 doses of epinephrine.  Significant three-vessel CAD with 60% diffuse proximal LAD  stenosis, long diffuse 70% diagonal stenosis and 80% LAD stenosis after the first diagonal vessel; 70 to 80% proximal diffuse circumflex stenosis before a large marginal branch; and very large dominant RCA with 70% proximal stenosis and long diffuse 80 and 85% stenoses beyond the acute margin proximal to the PDA takeoff with mild 30% narrowing in the PDA.  Mild acute LV dysfunction with focal anterolateral hypocontractility and EF estimated 45 to 50%. LVEDP 12 mm  RECOMMENDATION: Suspect transient coronary vasospasm involving the LAD circulation in the etiology of the patient's VF cardiac arrest. Low-dose IV nitroglycerin was started at the end of the catheterization procedure. Patient will be transported to to heart and evaluated by critical care with consideration for hypothermia due to witnessed cardiac arrest. A 2D echo Doppler study will be obtained. We will review angios with colleagues but with diffuse multivessel CAD in this diabetic male consider possible surgical consultation for CABG revascularization following stability.      ASSESSMENT:    1. Cardiac arrest ([place]): 02/28/2020   2. S/P CABG x 4 with LIMA to LAD, RIMA to RCA, and sequential radial graft to the diagonal and marginal vessel April 06, 2020   3. Essential hypertension   4. Poset-operative PAF(HCC)   5. Hyperlipidemia with target LDL less than 70   6. Controlled type 2 diabetes mellitus with complication, with long-term current use of insulin (HCC)     PLAN:  Richard Hatfield is a 65 year old gentleman who has a history of hypertension, hyperlipidemia, and diabetes mellitus who presented an out of hospital cardiac arrest treated with bystander CPR by his wife with ultimate ROSC.  He was found to have multivessel CAD.  Following initial anoxic and cephalopathy was ultimately cleared he underwent successful CABG revascularization surgery x4 with arterial conduits with bilateral  internal mammary arteries as well as the  left radial artery.  He did develop postoperative atrial fibrillation and has been on amiodarone.  Presently, he has been without anginal symptomatology.  His blood pressure today is mildly elevated despite taking irbesartan 37.5 mg twice a day, metoprolol tartrate 12.5 mg twice a day in addition to isosorbide dinitrate 10 mg 3 times a day.  I have recommended titration of irbesartan to 75 mg daily.  He is maintaining sinus rhythm.  I have recommended he decrease amiodarone to 100 mg daily and at follow-up if he is maintaining sinus rhythm this can be discontinued with further titration of beta-blocker therapy.  He is bradycardic with a resting pulse of 51 on his low-dose beta-blocker therapy.  He is now on rosuvastatin for hyperlipidemia with target LDL less than 70.  He is on insulin in addition to [place] for his diabetes mellitus.  I have recommended a complete set of fasting laboratory and adjustments to his medical regimen will be made as needed.  I have recommended he see Coletta Memos, NP in 2 months and I will see him in 4 months for follow-up Cardiologic evaluation.   Medication Adjustments/Labs and Tests Ordered: Current medicines are reviewed at length with the patient today.  Concerns regarding medicines are outlined above.  Medication changes, Labs and Tests ordered today are listed in the Patient Instructions below. Patient Instructions  Medication Instructions:  Decrease amiodarone to 100 mg daily  Increase Irbesartan to 75 mg daily   *If you need a refill on your cardiac medications before your next appointment, please call your pharmacy*   Lab Work: CBC, CMET, TSH, LIPID (you may come back, fasting, no lab appointment needed)  If you have labs (blood work) drawn today and your tests are completely normal, you will receive your results only by: Marland Kitchen MyChart Message (if you have MyChart) OR . A paper copy in the mail If you have any lab test that is abnormal or we need to change  your treatment, we will call you to review the results.   Follow-Up: At [facility], you and your health needs are our priority.  As part of our continuing mission to provide you with exceptional heart care, we have created designated Provider Care Teams.  These Care Teams include your primary Cardiologist (physician) and Advanced Practice Providers (APPs -  Physician Assistants and Nurse Practitioners) who all work together to provide you with the care you need, when you need it.  We recommend signing up for the patient portal called "MyChart".  Sign up information is provided on this After Visit Summary.  MyChart is used to connect with patients for Virtual Visits (Telemedicine).  Patients are able to view lab/test results, encounter notes, upcoming appointments, etc.  Non-urgent messages can be sent to your provider as well.   To learn more about what you can do with MyChart, go to [email or web address].    Your next appointment:   Follow up with Coletta Memos, NP in 2 months Follow up with Dr.Linde Wilensky in 4 months.      Signed, Shelva Majestic, MD  09/11/2020 10:19 AM    [facility] [address], [place], [place], [place]  [address] Phone: [phone]

## 2020-09-11 ENCOUNTER — Telehealth: Payer: Self-pay | Admitting: Cardiovascular Disease

## 2020-09-11 ENCOUNTER — Encounter: Payer: Self-pay | Admitting: Cardiovascular Disease

## 2020-09-11 DIAGNOSIS — I4891 Unspecified atrial fibrillation: Secondary | ICD-10-CM | POA: Insufficient documentation

## 2020-09-11 NOTE — Telephone Encounter (Signed)
Returned the call to the patient. He stated that he was currently on Irbesartan 150 once a day and not 75 mg.  He wants to know if he needs to stay on the 150 mg or decrease it to 75 mg once daily. He was seen in the office on 09/10/20

## 2020-09-11 NOTE — Telephone Encounter (Signed)
Pt c/o medication issue:  1. Name of Medication: irbesartan (AVAPRO) 150 MG tablet  2. How are you currently taking this medication (dosage and times per day)? Patient states he has been taking this medication once daily (in the morning)  3. Are you having a reaction (difficulty breathing--STAT)? No   4. What is your medication issue? Patient is requesting clarification on how to take this medication. He states during his appointment with Dr. Claiborne Billings he was advised to increase his medication from 75 MG to 150 MG. However, he has already been taking the 150 MG.

## 2020-09-13 NOTE — Telephone Encounter (Signed)
Late entry 09-12-20: discussed with Dr Claiborne Billings, continue 150mg  Ibsartan 09-13-20  left detailed message for pt, call back if any questions

## 2020-11-19 NOTE — Progress Notes (Signed)
Cardiology Clinic Note   Patient Name: Richard Hatfield Date of Encounter: 11/20/2020  Primary Care Provider:  Pcp, No Primary Cardiologist:  Richard Majestic, MD  Patient Profile    Richard Hatfield 65 year old male presents to the clinic today for follow-up evaluation of his coronary artery disease status post CABG x4 04/06/20  Past Medical History    Past Medical History:  Diagnosis Date  . Cardiac arrest ([place]) 02/28/2020  . Coronary artery disease 02/2020   pt admitted for STEMI in 02/2020 (per pt)  . Diabetes mellitus without complication ([place])   . Hypertension   . Myocardial infarction [facility])    per pt he had a heart attack earlier (02/2020) - see encounters   Past Surgical History:  Procedure Laterality Date  . CORONARY ARTERY BYPASS GRAFT N/A 04/06/2020   Procedure: CORONARY ARTERY BYPASS GRAFTING (CABG), ON PUMP, TIMES FOUR, USING BILATERAL INTERNAL MAMMARY ARTERIES AND HARVESTED LEFT RADIAL ARTERY (OPEN);  Surgeon: Richard Olds, MD;  Location: [place];  Service: Open Heart Surgery;  Laterality: N/A;  . LEFT HEART CATH AND CORONARY ANGIOGRAPHY N/A 02/28/2020   Procedure: LEFT HEART CATH AND CORONARY ANGIOGRAPHY;  Surgeon: Richard Sine, MD;  Location: [place] [facility];  Service: Cardiovascular;  Laterality: N/A;  . RADIAL ARTERY HARVEST Left 04/06/2020   Procedure: RADIAL ARTERY HARVEST;  Surgeon: Richard Olds, MD;  Location: [place];  Service: Open Heart Surgery;  Laterality: Left;  . TEE WITHOUT CARDIOVERSION N/A 04/06/2020   Procedure: TRANSESOPHAGEAL ECHOCARDIOGRAM (TEE);  Surgeon: Richard Olds, MD;  Location: [place];  Service: Open Heart Surgery;  Laterality: N/A;    Allergies  No Known Allergies  History of Present Illness    Richard Hatfield has a past medical history of cardiac arrest, ACS, coronary artery disease, type 2 diabetes, acute encephalopathy, anoxic brain injury, AKI, transaminitis, CABG x4 on 04/06/2020  He presented to the hospital after  unexpected cardiac arrest at home 02/28/2020.  He received 20 minutes of CPR with return of ROSC.  He was transported to [facility] and underwent emergent cardiac catheterization which showed severe multivessel coronary artery disease.  He was noted to have suffered significant anoxic brain injury.  He was discharged to inpatient rehab and had significant improvement.  He returned to his baseline with his cognitive and physical conditioning.  He followed up with Richard Hatfield to discuss CABG.  He eventually underwent CABG x4 on 04/06/2020.  He was discharged on 04/11/2020 in stable condition.  He followed up with Richard Hatfield on 04/16/2020 he was doing well at that time, his sternal precautions were continued, CXR showed no acute changes, and he denied chest pain.  He presented to the clinic on 9/21 for follow-up evaluation and stated he felt well. He continued to slowly increase physical activity at home. He had been helping his wife with light housework tasks such as sweeping, and watering plants. 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 He had not been cleared by [facility] yet to drive. He denied side effects with his medication and had not noticed any palpitations. He had been arranging holiday photos and doing active memory type tasks. He did not notice any memory impairment at the time. I will gave him the salty 6 diet sheet and planned follow-up in 3 months.  He was Hatfield by Richard Hatfield on 09/10/2020.  He denied angina at that time.  He denied any recurrent episodes of atrial fibrillation.  His blood pressure was slightly elevated and his irbesartan was increased to 75 mg daily.  He maintained sinus rhythm his amiodarone was decreased to 100 mg daily.  Plan was discussed to discontinue amiodarone at follow-up visit and titrate beta-blocker.  His pulse was 51 at that time.  He presents the  clinic today for follow-up evaluation states he feels well.  He has not noticed any further episodes of increased heart rate or irregular heart rate.  His blood pressure has been well controlled at home and his blood pressure on reevaluation today is 128/76.  He reports that he has been following a heart healthy diet and increasing his physical activity.  He has been push mowing his lawn.  He reports that he is also taking extra steps at work.  His EKG today shows normal sinus rhythm.  We will stop his amiodarone, have him increase his physical activity as tolerated, continue his heart healthy low-sodium diet, and follow-up with Richard Hatfield as scheduled.  Today he denies chest pain, shortness of breath, lower extremity edema, fatigue, palpitations, melena, hematuria, hemoptysis, diaphoresis, weakness, presyncope, syncope, orthopnea, and PND.  Home Medications    Prior to Admission medications   Medication Sig Start Date End Date Taking? Authorizing Provider  acetaminophen (TYLENOL) 325 MG tablet Take 1-2 tablets (325-650 mg total) by mouth every 4 (four) hours as needed for mild pain. 03/20/20   Love, Richard Anchors, PA-C  amiodarone (PACERONE) 100 MG tablet Take 1 tablet (100 mg total) by mouth daily. 09/10/20   Richard Sine, MD  aspirin 81 MG chewable tablet Chew 1 tablet (81 mg total) by mouth daily. 03/09/20   Richard Friendly, MD  calcium carbonate (TUMS - DOSED IN MG ELEMENTAL CALCIUM) 500 MG chewable tablet Chew 1 tablet (200 mg of elemental calcium total) by mouth 2 (two) times daily. Patient taking differently: Chew 1 tablet by mouth 3 (three) times daily. 03/20/20   Love, Richard Anchors, PA-C  clopidogrel (PLAVIX) 75 MG tablet Take 1 tablet (75 mg total) by mouth daily. 04/10/20   Richard Skillern, PA-C  empagliflozin (JARDIANCE) 25 MG TABS tablet Take 1 tablet (25 mg total) by mouth daily. 03/20/20   Love, Richard Anchors, PA-C  insulin aspart (NOVOLOG) 100 UNIT/ML injection Inject 8 Units into the skin 3  (three) times daily before meals.    [provider]  insulin degludec (TRESIBA) 100 UNIT/ML FlexTouch Pen Inject 0.18 mLs (18 Units total) into the skin 2 (two) times daily. 03/20/20   Love, Richard Anchors, PA-C  irbesartan (AVAPRO) 75 MG tablet Take 0.5 tablets (37.5 mg total) by mouth daily. 03/20/20   Love, Richard Anchors, PA-C  isosorbide dinitrate (ISORDIL) 10 MG tablet Take 1 tablet (10 mg total) by mouth 3 (three) times daily. For one month then stop. 04/10/20   Richard Skillern, PA-C  metoprolol tartrate (LOPRESSOR) 25 MG tablet Take 0.5 tablets (12.5 mg total) by mouth 2 (two) times daily. 04/11/20   Richard Skillern, PA-C  Multiple Vitamin (MULTIVITAMIN WITH MINERALS) TABS tablet Take 1 tablet by mouth daily. 03/20/20   Love, Richard Anchors, PA-C  pantoprazole (PROTONIX) 20 MG tablet Take 1 tablet (20 mg total) by mouth daily. 03/20/20   Love, Richard Anchors, PA-C  polyethylene glycol (MIRALAX / GLYCOLAX) 17 g packet Take 17 g by mouth 2 (two) times daily. Patient taking differently: Take 17 g by mouth  daily as needed for moderate constipation. 03/20/20   Love, Richard Anchors, PA-C  rosuvastatin (CRESTOR) 40 MG tablet Take 1 tablet (40 mg total) by mouth daily. 03/20/20   Love, Richard Anchors, PA-C  traMADol (ULTRAM) 50 MG tablet Take 1 tablet (50 mg total) by mouth every 6 (six) hours as needed for moderate pain. 04/11/20   Richard Skillern, PA-C  traZODone (DESYREL) 50 MG tablet Take 0.5-1 tablets (25-50 mg total) by mouth at bedtime as needed for sleep. Patient not taking: Reported on 09/10/2020 03/20/20   Love, Richard Anchors, PA-C    Family History    History reviewed. No pertinent family history. has no family status information on file.   Social History    Social History   Socioeconomic History  . Marital status: Married    Spouse name: Cipriano Millikan  . Number of children: Not on file  . Years of education: Not on file  . Highest education level: Not on file  Occupational History  . Not on file   Tobacco Use  . Smoking status: Never Smoker  . Smokeless tobacco: Never Used  Vaping Use  . Vaping Use: Never used  Substance and Sexual Activity  . Alcohol use: Not Currently  . Drug use: Never  . Sexual activity: Not Currently    Partners: Female  Other Topics Concern  . Not on file  Social History Narrative  . Not on file   Social Determinants of Health   Financial Resource Strain: Not on file  Food Insecurity: Not on file  Transportation Needs: Not on file  Physical Activity: Not on file  Stress: Not on file  Social Connections: Not on file  Intimate Partner Violence: Not on file     Review of Systems    General:  No chills, fever, night sweats or weight changes.  Cardiovascular:  No chest pain, dyspnea on exertion, edema, orthopnea, palpitations, paroxysmal nocturnal dyspnea. Dermatological: No rash, lesions/masses Respiratory: No cough, dyspnea Urologic: No hematuria, dysuria Abdominal:   No nausea, vomiting, diarrhea, bright red blood per rectum, melena, or hematemesis Neurologic:  No visual changes, wkns, changes in mental status. All other systems reviewed and are otherwise negative except as noted above.  Physical Exam    VS:  BP 128/76   Pulse 70   Ht 5\' 9"  (1.753 m)   Wt 208 lb 3.2 oz (94.4 kg)   SpO2 99%   BMI 30.75 kg/m  , BMI Body mass index is 30.75 kg/m. GEN: Well nourished, well developed, in no acute distress. HEENT: normal. Neck: Supple, no JVD, carotid bruits, or masses. Cardiac: RRR, no murmurs, rubs, or gallops. No clubbing, cyanosis, edema.  Radials/DP/PT 2+ and equal bilaterally.  Respiratory:  Respirations regular and unlabored, clear to auscultation bilaterally. GI: Soft, nontender, nondistended, BS + x 4. MS: no deformity or atrophy. Skin: warm and dry, no rash. Neuro:  Strength and sensation are intact. Psych: Normal affect.  Accessory Clinical Findings    Recent Labs: 03/03/2020: TSH 2.920 04/04/2020: ALT 43 04/07/2020:  Magnesium 1.9 04/08/2020: BUN 10; Creatinine, Ser 0.77; Potassium 4.1; Sodium 137 04/09/2020: Hemoglobin 8.9; Platelets 163   Recent Lipid Panel    Component Value Date/Time   CHOL 272 (H) 02/28/2020 0126   TRIG 158 (H) 03/02/2020 0445   HDL NOT REPORTED DUE TO HIGH TRIGLYCERIDES 02/28/2020 0126   CHOLHDL NOT REPORTED DUE TO HIGH TRIGLYCERIDES 02/28/2020 0126   VLDL UNABLE TO CALCULATE IF TRIGLYCERIDE OVER 400 mg/dL 02/28/2020 0126  LDLCALC UNABLE TO CALCULATE IF TRIGLYCERIDE OVER 400 mg/dL 02/28/2020 0126   LDLDIRECT 61.9 02/28/2020 0126    ECG personally reviewed by me today-normal sinus rhythm possible left atrial enlargement T wave abnormality consider lateral ischemia 61 bpm- No acute changes  EKG 04/24/2020 normal sinus rhythm T wave abnormality consider anterior lateral ischemia 65 bpm- No acute changes  Echocardiogram 02/28/2020 IMPRESSIONS    1. Normal wall motion in visualized segments. Mid to distal anterolateral  wall not well visualized on apical images. Consider repeat with echo  contrast for wall motion when patient less agitated.. Left ventricular  ejection fraction, by estimation, is 50  to 55%. The left ventricle has low normal function. The left ventricle  has no regional wall motion abnormalities. There is mild concentric left  ventricular hypertrophy. Left ventricular diastolic parameters were  normal.  2. Right ventricular systolic function is normal. The right ventricular  size is normal. There is mildly elevated pulmonary artery systolic  pressure.  3. Left atrial size was mildly dilated.  4. The mitral valve is normal in structure. Trivial mitral valve  regurgitation. No evidence of mitral stenosis.  5. The aortic valve has an indeterminant number of cusps. Aortic valve  regurgitation is not visualized. Mild aortic valve stenosis.   Comparison(s): No prior Echocardiogram.   Assessment & Plan   1.  Coronary artery disease/status post CABG  x4-denies chest pain, no recent episodes of discomfort.  Has returned to normal physical activity.  Continue aspirin, Plavix, Jardiance, Heart healthy low-sodium diet-salty 6 given Maintain physical activity as tolerated  Postoperative atrial fibrillation-EKG today shows normal sinus rhythm possible left atrial enlargement T wave abnormality 61 bpm.  Denies any further episodes of fast or irregular heartbeat.  Continue  metoprolol Stop amiodarone Avoid triggers caffeine, chocolate, EtOH etc. Heart healthy low-sodium diet-salty 6 given Increase physical activity as tolerated  Essential hypertension-BP today  128/76. Well-controlled at home Continue irbesartan, metoprolol, Heart healthy low-sodium diet-salty 6 given Increase physical activity as tolerated  Hyperlipidemia-02/28/2020: Cholesterol 272; HDL NOT REPORTED DUE TO HIGH TRIGLYCERIDES; LDL Cholesterol UNABLE TO CALCULATE IF TRIGLYCERIDE OVER 400 mg/dL; VLDL UNABLE TO CALCULATE IF TRIGLYCERIDE OVER 400 mg/dL 03/02/2020: Triglycerides 158 Continue rosuvastatin Heart healthy low-sodium high-fiber diet Repeat fasting lipids and liver  Type 2 diabetes-blood glucose 161 on 04/08/2020. Continue Jardiance, insulin Heart healthy low-sodium carb modified diet Increase physical activity as tolerated  Disposition: Follow-up with Richard Hatfield as scheduled.   Jossie Ng. Damiano Stamper NP-C    11/20/2020, 3:06 PM [address] Office [phone] Fax [phone]  Notice: This dictation was prepared with Dragon dictation along with smaller phrase technology. Any transcriptional errors that result from this process are unintentional and may not be corrected upon review.  I spent 14 minutes examining this patient, reviewing medications, and using patient centered shared decision making involving her cardiac care.  Prior to her visit I spent greater than 20 minutes reviewing her past medical history,   medications, and prior cardiac tests.

## 2020-11-20 ENCOUNTER — Other Ambulatory Visit: Payer: Self-pay

## 2020-11-20 ENCOUNTER — Ambulatory Visit (INDEPENDENT_AMBULATORY_CARE_PROVIDER_SITE_OTHER): Payer: BC Managed Care – PPO | Admitting: General Practice

## 2020-11-20 ENCOUNTER — Encounter: Payer: Self-pay | Admitting: General Practice

## 2020-11-20 VITALS — BP 128/76 | HR 70 | Ht 69.0 in | Wt 208.2 lb

## 2020-11-20 DIAGNOSIS — Z794 Long term (current) use of insulin: Secondary | ICD-10-CM

## 2020-11-20 DIAGNOSIS — Z951 Presence of aortocoronary bypass graft: Secondary | ICD-10-CM

## 2020-11-20 DIAGNOSIS — I10 Essential (primary) hypertension: Secondary | ICD-10-CM | POA: Diagnosis not present

## 2020-11-20 DIAGNOSIS — E785 Hyperlipidemia, unspecified: Secondary | ICD-10-CM | POA: Diagnosis not present

## 2020-11-20 DIAGNOSIS — Z79899 Other long term (current) drug therapy: Secondary | ICD-10-CM

## 2020-11-20 DIAGNOSIS — I480 Paroxysmal atrial fibrillation: Secondary | ICD-10-CM

## 2020-11-20 DIAGNOSIS — E118 Type 2 diabetes mellitus with unspecified complications: Secondary | ICD-10-CM

## 2020-11-20 NOTE — Patient Instructions (Signed)
Medication Instructions:  STOP AMIODARONE  *If you need a refill on your cardiac medications before your next appointment, please call your pharmacy*  Lab Work: FASTING LIPID AND LFT-AS SOON AS YOU CAN If you have labs (blood work) drawn today and your tests are completely normal, you will receive your results only by:  [place] (if you have MyChart) OR A paper copy in the mail.  If you have any lab test that is abnormal or we need to change your treatment, we will call you to review the results. You may go to any [facility] that is convenient for you however, we do have a lab in our office that is able to assist you. You DO NOT need an appointment for our lab. The lab is open 8:00am and closes at 4:00pm. Lunch 12:45 - 1:45pm.  Testing/Procedures: NONE  Special Instructions  PLEASE READ AND FOLLOW SALTY 6-ATTACHED-1,800 mg daily  PLEASE INCREASE PHYSICAL ACTIVITY AS TOLERATED  Follow-Up: Your next appointment:  KEEP SCHEDULED APPOINTMENT  In Person with Shelva Majestic, MD At [facility], you and your health needs are our priority.  As part of our continuing mission to provide you with exceptional heart care, we have created designated Provider Care Teams.  These Care Teams include your primary Cardiologist (physician) and Advanced Practice Providers (APPs -  Physician Assistants and Nurse Practitioners) who all work together to provide you with the care you need, when you need it.  We recommend signing up for the patient portal called "MyChart".  Sign up information is provided on this After Visit Summary.  MyChart is used to connect with patients for Virtual Visits (Telemedicine).  Patients are able to view lab/test results, encounter notes, upcoming appointments, etc.  Non-urgent messages can be sent to your provider as well.   To learn more about what you can do with MyChart, go to [email or web address].

## 2020-11-24 ENCOUNTER — Other Ambulatory Visit: Payer: Self-pay | Admitting: Physical Medicine and Rehabilitation

## 2020-11-27 ENCOUNTER — Telehealth: Payer: Self-pay | Admitting: Cardiovascular Disease

## 2020-11-27 NOTE — Telephone Encounter (Signed)
Left message to call back  

## 2020-11-27 NOTE — Telephone Encounter (Signed)
Pt c/o medication issue:  1. Name of Medication: irbesartan (AVAPRO) 75 MG tablet valsartan  2. How are you currently taking this medication (dosage and times per day)?   3. Are you having a reaction (difficulty breathing--STAT)? No   4. What is your medication issue? PT's wife is calling there is a mix up at the pharmacy with husbands irbesartan that he has already been taking. Wife states there is a additional prescription valsartan that he needs to take and she wants to know which one is the correct medication to pick up from the pharmacy.Please advise

## 2020-11-27 NOTE — Telephone Encounter (Signed)
Spoke with pt's wife [name] regarding pt taking irbesartan vs valsartan. Per chart pt has been on irbesartan. Per pt and wife medication is well tolerated and they are unsure of who sent in this prescription. Advised pt to stay on irbesartan. Pt and wife verbalize understanding.

## 2020-12-06 LAB — HEPATIC FUNCTION PANEL
ALT: 26 IU/L (ref 0–44)
AST: 42 IU/L — ABNORMAL HIGH (ref 0–40)
Albumin: 4.7 g/dL (ref 3.8–4.8)
Alkaline Phosphatase: 45 IU/L (ref 44–121)
Bilirubin Total: 0.2 mg/dL (ref 0.0–1.2)
Bilirubin, Direct: 0.11 mg/dL (ref 0.00–0.40)
Total Protein: 7.2 g/dL (ref 6.0–8.5)

## 2020-12-06 LAB — LIPID PANEL
Chol/HDL Ratio: 4.6 ratio (ref 0.0–5.0)
Cholesterol, Total: 176 mg/dL (ref 100–199)
HDL: 38 mg/dL — ABNORMAL LOW (ref >39)
LDL Chol Calc (NIH): 55 mg/dL (ref 0–99)
Triglycerides: 562 mg/dL (ref 0–149)
VLDL Cholesterol Cal: 83 mg/dL — ABNORMAL HIGH (ref 5–40)

## 2020-12-07 ENCOUNTER — Other Ambulatory Visit: Payer: Self-pay

## 2020-12-07 ENCOUNTER — Telehealth: Payer: Self-pay

## 2020-12-07 DIAGNOSIS — E785 Hyperlipidemia, unspecified: Secondary | ICD-10-CM

## 2020-12-07 MED ORDER — ICOSAPENT ETHYL 1 G PO CAPS: 2.0000 g | ORAL_CAPSULE | Freq: Two times a day (BID) | ORAL | 3 refills | Status: DC

## 2020-12-07 NOTE — Telephone Encounter (Addendum)
Left a voice message for the patient to give office a call back for results, new medication and repeat labs.  ----- Message from Deberah Pelton, NP sent at 12/07/2020  9:01 AM EDT ----- Please contact Richard Hatfield and let him know that his lab results have been reviewed.  His triglycerides are elevated at 562.  We will start him on Vascepa 2 g twice daily and recheck his fasting lipids in 8 weeks.  Thank you.

## 2020-12-12 ENCOUNTER — Telehealth: Payer: Self-pay

## 2020-12-12 NOTE — Telephone Encounter (Signed)
Received CMM PA needed for Vascepa. D/w Denyse Amass change to OTC, Fish Oil 2G BID. Pt notified he states that he was taking 1,000mg  TID he will change as directed.

## 2020-12-18 ENCOUNTER — Telehealth: Payer: Self-pay | Admitting: Cardiovascular Disease

## 2020-12-18 DIAGNOSIS — I2510 Atherosclerotic heart disease of native coronary artery without angina pectoris: Secondary | ICD-10-CM

## 2020-12-18 DIAGNOSIS — E785 Hyperlipidemia, unspecified: Secondary | ICD-10-CM

## 2020-12-18 DIAGNOSIS — E7800 Pure hypercholesterolemia, unspecified: Secondary | ICD-10-CM

## 2020-12-18 NOTE — Telephone Encounter (Signed)
*  STAT* If patient is at the pharmacy, call can be transferred to refill team.   1. Which medications need to be refilled? (please list name of each medication and dose if known) icosapent Ethyl (VASCEPA) 1 g capsule  2. Which pharmacy/location (including street and city if local pharmacy) is medication to be sent to? [id number] [place], [place] - [address]  3. Do they need a 30 day or 90 day supply? 90   Patient states the pharmacy is giving him hard time in filling this prescription. Please advise

## 2020-12-21 MED ORDER — OMEGA-3 CF 1000 MG PO CAPS: 2.0000 | ORAL_CAPSULE | Freq: Two times a day (BID) | ORAL | 0 refills | Status: DC

## 2020-12-21 MED ORDER — OMEGA-3-ACID ETHYL ESTERS 1 G PO CAPS: 2.0000 g | ORAL_CAPSULE | Freq: Two times a day (BID) | ORAL | 6 refills | Status: DC

## 2020-12-21 NOTE — Telephone Encounter (Signed)
Left message to let pt know that since Vascepa was not covered, (per Coletta Memos, FNP-C) we are going to try Lovaza and see how this one is covered.  [facility]ed [phone] pharmacist said that lovaza was also not covered and needed a PA.  Per Coletta Memos, FNP-C start fish oil OTC 2g BID. Pt notified, he states that he was been taking fish oil 1000mg  and he just increased to 2g BID. He will continue this and we will refer to [facility] lipid clinic per Coletta Memos, FNP-C RPH-Lipid referral entered. Pt will await call from scheduling

## 2020-12-21 NOTE — Addendum Note (Signed)
Addended by: Waylan Rocher on: 12/21/2020 12:28 PM   Modules accepted: Orders

## 2021-01-04 ENCOUNTER — Other Ambulatory Visit: Payer: Self-pay

## 2021-01-04 ENCOUNTER — Ambulatory Visit (INDEPENDENT_AMBULATORY_CARE_PROVIDER_SITE_OTHER): Payer: BC Managed Care – PPO | Admitting: Pharmacist

## 2021-01-04 VITALS — BP 160/82 | HR 60 | Wt 211.8 lb

## 2021-01-04 DIAGNOSIS — I10 Essential (primary) hypertension: Secondary | ICD-10-CM | POA: Diagnosis not present

## 2021-01-04 DIAGNOSIS — H35039 Hypertensive retinopathy, unspecified eye: Secondary | ICD-10-CM | POA: Diagnosis not present

## 2021-01-04 DIAGNOSIS — E7800 Pure hypercholesterolemia, unspecified: Secondary | ICD-10-CM

## 2021-01-04 DIAGNOSIS — E663 Overweight: Secondary | ICD-10-CM | POA: Insufficient documentation

## 2021-01-04 DIAGNOSIS — J690 Pneumonitis due to inhalation of food and vomit: Secondary | ICD-10-CM | POA: Insufficient documentation

## 2021-01-04 DIAGNOSIS — I2510 Atherosclerotic heart disease of native coronary artery without angina pectoris: Secondary | ICD-10-CM | POA: Diagnosis not present

## 2021-01-04 DIAGNOSIS — Z951 Presence of aortocoronary bypass graft: Secondary | ICD-10-CM | POA: Insufficient documentation

## 2021-01-04 DIAGNOSIS — Z1211 Encounter for screening for malignant neoplasm of colon: Secondary | ICD-10-CM | POA: Insufficient documentation

## 2021-01-04 MED ORDER — IRBESARTAN 300 MG PO TABS: 300.0000 mg | ORAL_TABLET | Freq: Every day | ORAL | 1 refills | Status: DC

## 2021-01-04 NOTE — Patient Instructions (Signed)
It was nice meeting you today  We would like your LDL (bad cholesterol) to be less than 55 We would like your triglycerides to be less than 150  We would like you to concentrate on real foods such as vegetables, fruits, and lean proteins that are not processed.  I will complete the prior authorization for your Vascepa and when it is approved you will take 2 capsules twice a day  I will call you when it is approved  Karren Cobble, PharmD, BCACP, [place], [place]. [address], [place], [place] [address] Phone: [phone]; Fax: [phone] 01/04/2021 3:00 PM

## 2021-01-04 NOTE — Progress Notes (Signed)
Patient ID: Richard Hatfield                 DOB: August 09, 1956                    MRN: [id number]     HPI: Richard Hatfield is a 65 y.o. male patient referred to lipid clinic by Sheela Stack. PMH is significant for CAD, NSTEMI, DM, HTN and CABG x 4.   Patient presents today in good spirits.  Reports medication compliance and has increased physcial activity around his house.  Helps with gardening and mows yard.    Reports he does not drink excessive alcohol, will occasionally have beer on weekends.  Has eliminated sugary beverages from diet.  Reports he does not eat processed foods, however when questioned he had hamburger helper for lunch.  Also eats sausage, bacon, [employer], margarine.  Drinks bottled water or diet sodas.  Will drink a small amount of apple juice in the morning with his morning medications  Current Medications: fenofibrate 145mg , rosuvastatin, fish oil 2000mg  BID Intolerances: n/a Risk Factors: CAD, DM, hx of NSTEMI LDL goal: <55 Triglyceride goal: <150  Labs:Trigs 562, HDL 38, LDL 55, TC 176 (12/05/20 on crestor 40, fenofibrate 145mg , fish oil 1000)  Past Medical History:  Diagnosis Date  . Cardiac arrest ([place]) 02/28/2020  . Coronary artery disease 02/2020   pt admitted for STEMI in 02/2020 (per pt)  . Diabetes mellitus without complication ([place])   . Hypertension   . Myocardial infarction [facility])    per pt he had a heart attack earlier (02/2020) - see encounters    Current Outpatient Medications on File Prior to Visit  Medication Sig Dispense Refill  . acetaminophen (TYLENOL) 325 MG tablet Take 1-2 tablets (325-650 mg total) by mouth every 4 (four) hours as needed for mild pain.    Marland Kitchen aspirin 81 MG chewable tablet Chew 1 tablet (81 mg total) by mouth daily.    . calcium carbonate (TUMS - DOSED IN MG ELEMENTAL CALCIUM) 500 MG chewable tablet Chew 1 tablet (200 mg of elemental calcium total) by mouth 2 (two) times daily. (Patient taking differently: Chew 1 tablet by mouth 3  (three) times daily.) 60 tablet 0  . clopidogrel (PLAVIX) 75 MG tablet Take 1 tablet (75 mg total) by mouth daily. 30 tablet 0  . empagliflozin (JARDIANCE) 25 MG TABS tablet Take 1 tablet (25 mg total) by mouth daily. 30 tablet 0  . insulin aspart (NOVOLOG) 100 UNIT/ML injection Inject 8 Units into the skin 3 (three) times daily before meals.    . insulin degludec (TRESIBA) 100 UNIT/ML FlexTouch Pen Inject 0.18 mLs (18 Units total) into the skin 2 (two) times daily.    . irbesartan (AVAPRO) 75 MG tablet Take 0.5 tablets (37.5 mg total) by mouth daily. 30 tablet 0  . isosorbide dinitrate (ISORDIL) 10 MG tablet Take 1 tablet (10 mg total) by mouth 3 (three) times daily. For one month then stop. 90 tablet 0  . metoprolol tartrate (LOPRESSOR) 25 MG tablet Take 0.5 tablets (12.5 mg total) by mouth 2 (two) times daily. 30 tablet 1  . Multiple Vitamin (MULTIVITAMIN WITH MINERALS) TABS tablet Take 1 tablet by mouth daily.    . Omega-3 Fatty Acids (OMEGA-3 CF) 1000 MG CAPS Take 2 capsules (2,000 mg total) by mouth in the morning and at bedtime. 60 capsule 0  . pantoprazole (PROTONIX) 20 MG tablet Take 1 tablet (20 mg total) by mouth  daily. 30 tablet 0  . polyethylene glycol (MIRALAX / GLYCOLAX) 17 g packet Take 17 g by mouth 2 (two) times daily. (Patient taking differently: Take 17 g by mouth daily as needed for moderate constipation.) 60 each 0  . rosuvastatin (CRESTOR) 40 MG tablet Take 1 tablet (40 mg total) by mouth daily. 30 tablet 0  . traMADol (ULTRAM) 50 MG tablet Take 1 tablet (50 mg total) by mouth every 6 (six) hours as needed for moderate pain. 30 tablet 0  . traZODone (DESYREL) 50 MG tablet Take 0.5-1 tablets (25-50 mg total) by mouth at bedtime as needed for sleep. 10 tablet 0   No current facility-administered medications on file prior to visit.    No Known Allergies  Assessment/Plan:  1. Hyperlipidemia - Patient LDL 55 which is right at goal.  Triglycerides however are very elevated,  likely from diet.  Patient not clear on which foods will elevate triglycerides and which would not.  Explained the importance of increasing vegetables, fruits and lean proteins.  Avoid sugary beverages and watch salt content in foods such as his hamburger helper.  Previously was prescribed Vascepa but this denied at pharmacy and required PA.  Will submit PA since patient should qualify based upon his triglyceride level and extensive cardiac history.  2. HTN - Patient BP in room today 160/82 after recheck which is above goal of <130/80.  Will increase irbesartan to 300mg  once daily.  Karren Cobble, PharmD, BCACP, [place], [place] [address], [place],  [address] Phone: [phone]; Fax: [phone] 01/04/2021 4:12 PM

## 2021-01-07 ENCOUNTER — Telehealth: Payer: Self-pay | Admitting: Pharmacist

## 2021-01-07 NOTE — Telephone Encounter (Signed)
Called and message on machine.  Vascepa PA approved until 01/05/2024.  Advised patient to being coupon to pharmacy for discounted price.    Also advised that per Coletta Memos, patient should discontinue amiodarone.  Instructed pt to call back with any questions.

## 2021-01-09 ENCOUNTER — Other Ambulatory Visit: Payer: Self-pay

## 2021-01-09 ENCOUNTER — Ambulatory Visit: Payer: BC Managed Care – PPO | Admitting: Cardiovascular Disease

## 2021-01-09 ENCOUNTER — Encounter: Payer: Self-pay | Admitting: Cardiovascular Disease

## 2021-01-09 VITALS — BP 140/88 | HR 59 | Ht 69.0 in | Wt 212.2 lb

## 2021-01-09 DIAGNOSIS — Z951 Presence of aortocoronary bypass graft: Secondary | ICD-10-CM

## 2021-01-09 DIAGNOSIS — I10 Essential (primary) hypertension: Secondary | ICD-10-CM

## 2021-01-09 DIAGNOSIS — E782 Mixed hyperlipidemia: Secondary | ICD-10-CM

## 2021-01-09 DIAGNOSIS — Z794 Long term (current) use of insulin: Secondary | ICD-10-CM

## 2021-01-09 DIAGNOSIS — E118 Type 2 diabetes mellitus with unspecified complications: Secondary | ICD-10-CM

## 2021-01-09 DIAGNOSIS — I469 Cardiac arrest, cause unspecified: Secondary | ICD-10-CM | POA: Diagnosis not present

## 2021-01-09 DIAGNOSIS — E785 Hyperlipidemia, unspecified: Secondary | ICD-10-CM

## 2021-01-09 NOTE — Progress Notes (Signed)
Cardiology Office Note    Date:  01/15/2021   ID:  Richard Hatfield, DOB March 08, 1956, MRN [id number]   PCP:  Dr.Tisovec Cardiologist:  Shelva Majestic, MD   60-monthcardiology follow-up evaluation  History of Present Illness:  Richard AKKERMANis a 65y.o. male who has a history of hypertension, hyperlipidemia, and diabetes mellitus [facility] developing an out of hospital cardiac arrest witnessed by his wife.  CPR was initiated promptly and received approximately 20 minutes of CPR and 2 rounds of epinephrine and defibrillations by EMS.  Cardiac catheterization revealed significant multivessel CAD with wall motion demonstrating hypocontractility in the anterolateral wall.  He had suffered initial anoxic encephalopathy and following stabilization went to inpatient rehab with significant improvement.  He returned to his baseline cognitive function.  Ultimately he underwent CABG revascularization surgery x4 in April 06, 2020 by Dr. AJulien Girtwith a LIMA to LAD, left radial to the diagonal and marginal, and RIMA to the posterior lateral branch of the RCA.  Subsequently, he has done well.  He was seen by Richard Hatfield April 24, 2020 for initial office evaluation.  He had developed postoperative atrial fibrillation for which he was treated with amiodarone.   I saw him for my initial cardiology office visit following his cardiac arrest and bypass surgery in January 2022.  At that time he was on  amiodarone at 200 mg daily and denied any recurrence of arrhythmia.  He continued to be on aspirin/Plavix.  He was on rosuvastatin for hyperlipidemia.  He continued also to be on irbesartan 37.5 mg, isosorbide dinitrate 10 mg 3 times a day in addition to 12.5 mg twice a day of metoprolol.  He is diabetic on insulin and Jardiance.  Is only he denies any recurrent anginal symptoms.  He does admit to being tired more easily.  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  During my evaluation, he was maintaining sinus rhythm but was bradycardic with a pulse of 51.  His blood pressure was also elevated.  I recommended titration of irbesartan to 75 mg daily and that he decrease amiodarone to 100 mg.  He was evaluated by Richard Hatfield on November 20, 2020 at which time he was stable and [facility].  LDL was 55 but triglycerides remain very elevated and he was prescribed Vascepa but this had initially been denied at pharmacy but ultimately required prior authorization which was submitted by her pharmacist.  Also his blood pressure was elevated and irbesartan was further titrated.  Presently, he feels well.  He denies chest pain or shortness of breath.  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  Her blood pressure and CAD he is on amlodipine 5 mg, irbesartan 3 mg daily, metoprolol titrate 12.5 mg twice a day and isosorbide.  He is no longer on amiodarone.  He is diabetic on insulin in addition to Richard Hatfield  He presents for follow-up evaluation.  Past Medical History:  Diagnosis Date  . Cardiac arrest ([place] 02/28/2020  . Coronary artery disease 02/2020   pt admitted for STEMI in 02/2020 (per pt)  .  Diabetes mellitus without complication ([place])   . Hypertension   . Myocardial infarction [facility])    per pt he had a heart attack earlier (02/2020) - see encounters    Past Surgical History:  Procedure Laterality Date  . CORONARY ARTERY BYPASS GRAFT N/A 04/06/2020   Procedure: CORONARY ARTERY BYPASS GRAFTING (CABG), ON PUMP, TIMES FOUR, USING BILATERAL INTERNAL MAMMARY ARTERIES AND HARVESTED LEFT RADIAL ARTERY (OPEN);  Surgeon: Wonda Olds, MD;  Location: [place];  Service: Open Heart Surgery;  Laterality: N/A;  . LEFT HEART CATH AND CORONARY ANGIOGRAPHY N/A 02/28/2020   Procedure: LEFT HEART CATH AND CORONARY ANGIOGRAPHY;  Surgeon: Troy Sine, MD;  Location: [place] CV LAB;  Service: Cardiovascular;  Laterality: N/A;  . RADIAL ARTERY HARVEST Left 04/06/2020   Procedure: RADIAL ARTERY HARVEST;  Surgeon: Wonda Olds, MD;  Location: [place];  Service: Open Heart Surgery;  Laterality: Left;  . TEE WITHOUT CARDIOVERSION N/A 04/06/2020   Procedure: TRANSESOPHAGEAL ECHOCARDIOGRAM (TEE);  Surgeon: Wonda Olds, MD;  Location: [place];  Service: Open Heart Surgery;  Laterality: N/A;    Current Medications: Outpatient Medications Prior to Visit  Medication Sig Dispense Refill  . acetaminophen (TYLENOL) 325 MG tablet Take 1-2 tablets (325-650 mg total) by mouth every 4 (four) hours as needed for mild pain.    Marland Kitchen amLODipine (NORVASC) 5 MG tablet Take 1 tablet by mouth daily.    Marland Kitchen aspirin 81 MG chewable tablet Chew 1 tablet (81 mg total) by mouth daily.    . calcium carbonate (TUMS - DOSED IN MG ELEMENTAL CALCIUM) 500 MG chewable tablet Chew 1 tablet (200 mg of elemental calcium total) by mouth 2 (two) times daily. (Patient taking differently: Chew 1 tablet by mouth 3 (three) times daily.) 60 tablet 0  . clopidogrel (PLAVIX) 75 MG tablet Take 1 tablet (75 mg total) by mouth daily. 30 tablet 0  . empagliflozin (JARDIANCE) 25 MG TABS tablet Take 1 tablet (25 mg total) by mouth daily. 30 tablet 0  . EPINEPHrine 0.3 mg/0.3 mL IJ SOAJ injection Inject 1 mL into the skin See admin instructions.    . fenofibrate (TRICOR) 145 MG tablet Take 1 tablet by mouth daily.    Marland Kitchen glucose blood (ONETOUCH VERIO) test strip 1 each by Other route daily.    . insulin aspart (NOVOLOG) 100 UNIT/ML injection Inject 12 Units into the skin 3 (three) times daily before meals.    . insulin degludec (TRESIBA) 100 UNIT/ML FlexTouch Pen Inject 0.18 mLs (18 Units total) into the skin  2 (two) times daily. (Patient taking differently: Inject 32 Units into the skin 2 (two) times daily.)    . Insulin Pen Needle (B-D ULTRAFINE III SHORT PEN) 31G X 8 MM MISC USE 4 TIMES A DAY AS DIRECTED    . irbesartan (AVAPRO) 300 MG tablet Take 1 tablet (300 mg total) by mouth daily. 90 tablet 1  . isosorbide dinitrate (ISORDIL) 10 MG tablet Take 1 tablet (10 mg total) by mouth 3 (three) times daily. For one month then stop. 90 tablet 0  . Lancets (ONETOUCH DELICA PLUS [id number]) MISC USE TO SELF MONITOR BLOOD GLUCOSE THREE TIMES DAILY    . metoprolol tartrate (LOPRESSOR) 25 MG tablet Take 0.5 tablets (12.5 mg total) by mouth 2 (two) times daily. 30 tablet 1  . Multiple Vitamin (MULTIVITAMIN WITH MINERALS) TABS tablet Take 1 tablet by mouth daily.    . Omega-3 Fatty Acids (OMEGA-3 CF) 1000 MG CAPS Take 2  capsules (2,000 mg total) by mouth in the morning and at bedtime. 60 capsule 0  . pantoprazole (PROTONIX) 20 MG tablet Take 1 tablet (20 mg total) by mouth daily. 30 tablet 0  . polyethylene glycol (MIRALAX / GLYCOLAX) 17 g packet Take 17 g by mouth 2 (two) times daily. (Patient taking differently: Take 17 g by mouth daily as needed for moderate constipation.) 60 each 0  . rosuvastatin (CRESTOR) 40 MG tablet Take 1 tablet (40 mg total) by mouth daily. 30 tablet 0  . traMADol (ULTRAM) 50 MG tablet Take 1 tablet (50 mg total) by mouth every 6 (six) hours as needed for moderate pain. 30 tablet 0  . traZODone (DESYREL) 50 MG tablet Take 0.5-1 tablets (25-50 mg total) by mouth at bedtime as needed for sleep. 10 tablet 0   No facility-administered medications prior to visit.     Allergies:   Patient has no known allergies.   Social History   Socioeconomic History  . Marital status: Married    Spouse name: Richard Hatfield  . Number of children: Not on file  . Years of education: Not on file  . Highest education level: Not on file  Occupational History  . Not on file  Tobacco Use  . Smoking  status: Never Smoker  . Smokeless tobacco: Never Used  Vaping Use  . Vaping Use: Never used  Substance and Sexual Activity  . Alcohol use: Not Currently  . Drug use: Never  . Sexual activity: Not Currently    Partners: Female  Other Topics Concern  . Not on file  Social History Narrative  . Not on file   Social Determinants of Health   Financial Resource Strain: Not on file  Food Insecurity: Not on file  Transportation Needs: Not on file  Physical Activity: Not on file  Stress: Not on file  Social Connections: Not on file     Family History:  The patient's family history is not on file.   ROS General: Negative; No fevers, chills, or night sweats;  HEENT: Negative; No changes in vision or hearing, sinus congestion, difficulty swallowing Pulmonary: Negative; No cough, wheezing, shortness of breath, hemoptysis Cardiovascular: See HPI GI: Negative; No nausea, vomiting, diarrhea, or abdominal pain GU: Negative; No dysuria, hematuria, or difficulty voiding Musculoskeletal: Negative; no myalgias, joint pain, or weakness Hematologic/Oncology: Negative; no easy bruising, bleeding Endocrine: Negative; no heat/cold intolerance; no diabetes Neuro: Negative; no changes in balance, headaches Skin: Negative; No rashes or skin lesions Psychiatric: Negative; No behavioral problems, depression Sleep: Negative; No snoring, daytime sleepiness, hypersomnolence, bruxism, restless legs, hypnogognic hallucinations, no cataplexy Other comprehensive 14 point system review is negative.   PHYSICAL EXAM:   VS:  BP 140/88   Pulse (!) 59   Ht '5\' 9"'  (1.753 m)   Wt 212 lb 3.2 oz (96.3 kg)   BMI 31.34 kg/m     Repeat blood pressure by me was 130/82  Wt Readings from Last 3 Encounters:  01/09/21 212 lb 3.2 oz (96.3 kg)  01/04/21 211 lb 12.8 oz (96.1 kg)  11/20/20 208 lb 3.2 oz (94.4 kg)     Physical Exam BP 140/88   Pulse (!) 59   Ht '5\' 9"'  (1.753 m)   Wt 212 lb 3.2 oz (96.3 kg)   BMI  31.34 kg/m  General: Alert, oriented, no distress.  Skin: normal turgor, no rashes, warm and dry HEENT: Normocephalic, atraumatic. Pupils equal round and reactive to light; sclera anicteric; extraocular muscles intact;  Nose without nasal septal hypertrophy Mouth/Parynx benign; Mallinpatti scale 3 Neck: No JVD, no carotid bruits; normal carotid upstroke Lungs: clear to ausculatation and percussion; no wheezing or rales Chest wall: without tenderness to palpitation Heart: PMI not displaced, RRR, s1 s2 normal, 1/6 systolic murmur, no diastolic murmur, no rubs, gallops, thrills, or heaves Abdomen: soft, nontender; no hepatosplenomehaly, BS+; abdominal aorta nontender and not dilated by palpation. Back: no CVA tenderness Pulses 2+ Musculoskeletal: full range of motion, normal strength, no joint deformities Extremities: no clubbing cyanosis or edema, Homan's sign negative  Neurologic: grossly nonfocal; Cranial nerves grossly wnl Psychologic: Normal mood and affect   Studies/Labs Reviewed:   EKG:  EKG is ordered today. NSR at 70; no ectopy, normal intervls  March 18, ECG (independently read by me): Sinus bradycardia at 59, anterolateral T wave abnormality; QTc 435  September 10, 2020 ECG (independently read by me): Sinus  Bradycardia at 51,  anterolateral T wave abnormality; QTc 4123 msec  Recent Labs: BMP Latest Ref Rng & Units 04/08/2020 04/07/2020 04/07/2020  Glucose 70 - 99 mg/dL 161(H) 252(H) -  BUN 8 - 23 mg/dL 10 9 -  Creatinine 0.61 - 1.24 mg/dL 0.77 0.86 0.82  Sodium 135 - 145 mmol/L 137 134(L) -  Potassium 3.5 - 5.1 mmol/L 4.1 4.2 -  Chloride 98 - 111 mmol/L 104 102 -  CO2 22 - 32 mmol/L 24 21(L) -  Calcium 8.9 - 10.3 mg/dL 8.6(L) 8.4(L) -     Hepatic Function Latest Ref Rng & Units 12/05/2020 04/04/2020 03/12/2020  Total Protein 6.0 - 8.5 g/dL 7.2 7.0 6.5  Albumin 3.8 - 4.8 g/dL 4.7 3.7 3.2(L)  AST 0 - 40 IU/L 42(H) 33 49(H)  ALT 0 - 44 IU/L 26 43 62(H)  Alk Phosphatase 44  - 121 IU/L 45 61 112  Total Bilirubin 0.0 - 1.2 mg/dL 0.2 0.5 0.7  Bilirubin, Direct 0.00 - 0.40 mg/dL 0.11 - -    CBC Latest Ref Rng & Units 04/09/2020 04/08/2020 04/07/2020  WBC 4.0 - 10.5 K/uL 8.4 8.0 9.4  Hemoglobin 13.0 - 17.0 g/dL 8.9(L) 9.1(L) 9.2(L)  Hematocrit 39.0 - 52.0 % 26.6(L) 27.4(L) 28.8(L)  Platelets 150 - 400 K/uL 163 115(L) 145(L)   Lab Results  Component Value Date   MCV 94.3 04/09/2020   MCV 97.2 04/08/2020   MCV 97.6 04/07/2020   Lab Results  Component Value Date   TSH 2.920 03/03/2020   Lab Results  Component Value Date   HGBA1C 8.2 (H) 04/04/2020     BNP No results found for: BNP  ProBNP No results found for: PROBNP   Lipid Panel     Component Value Date/Time   CHOL 176 12/05/2020 1202   TRIG 562 (HH) 12/05/2020 1202   HDL 38 (L) 12/05/2020 1202   CHOLHDL 4.6 12/05/2020 1202   CHOLHDL NOT REPORTED DUE TO HIGH TRIGLYCERIDES 02/28/2020 0126   VLDL UNABLE TO CALCULATE IF TRIGLYCERIDE OVER 400 mg/dL 02/28/2020 0126   LDLCALC 55 12/05/2020 1202   LDLDIRECT 61.9 02/28/2020 0126   LABVLDL 83 (H) 12/05/2020 1202     RADIOLOGY: No results found.   Additional studies/ records that were reviewed today include:     Prox LAD to Mid LAD lesion is 60% stenosed.  1st Diag lesion is 75% stenosed.  Mid LAD lesion is 80% stenosed.  Prox Cx lesion is 70% stenosed.  Prox Cx to Mid Cx lesion is 80% stenosed.  Prox RCA-1 lesion is 20% stenosed.  Prox RCA-2 lesion  is 70% stenosed.  RV Branch lesion is 90% stenosed.  Dist RCA-1 lesion is 80% stenosed.  Dist RCA-2 lesion is 85% stenosed.  RPDA lesion is 30% stenosed.  The left ventricular ejection fraction is 45-50% by visual estimate.  LV end diastolic pressure is normal.  There is mild left ventricular systolic dysfunction.   Out of hospital witnessed VF cardiac arrest with return of ROSC after approximately 20 minutes of CPR and administration of 2 doses of  epinephrine.  Significant three-vessel CAD with 60% diffuse proximal LAD stenosis, long diffuse 70% diagonal stenosis and 80% LAD stenosis after the first diagonal vessel; 70 to 80% proximal diffuse circumflex stenosis before a large marginal branch; and very large dominant RCA with 70% proximal stenosis and long diffuse 80 and 85% stenoses beyond the acute margin proximal to the PDA takeoff with mild 30% narrowing in the PDA.  Mild acute LV dysfunction with focal anterolateral hypocontractility and EF estimated 45 to 50%. LVEDP 12 mm  RECOMMENDATION: Suspect transient coronary vasospasm involving the LAD circulation in the etiology of the patient's VF cardiac arrest. Low-dose IV nitroglycerin was started at the end of the catheterization procedure. Patient will be transported to to heart and evaluated by critical care with consideration for hypothermia due to witnessed cardiac arrest. A 2D echo Doppler study will be obtained. We will review angios with colleagues but with diffuse multivessel CAD in this diabetic male consider possible surgical consultation for CABG revascularization following stability.      ASSESSMENT:    1. Cardiac arrest (): 02/28/2020   2. S/P CABG x 4 with LIMA to LAD, RIMA to RCA, and sequential radial graft to the diagonal and marginal vessel April 06, 2020   3. Essential hypertension   4. Hyperlipidemia with target LDL less than 70   5. Mixed hyperlipidemia   6. Controlled type 2 diabetes mellitus with complication, with long-term current use of insulin (HCC)     PLAN:  Mr. Lemario Chaikin is a 65 year old gentleman who has a history of hypertension, hyperlipidemia, and diabetes mellitus who presented an out of hospital cardiac arrest treated with bystander CPR by his wife with ultimate ROSC.  He was found to have multivessel CAD.  Following initial anoxic and cephalopathy was ultimately cleared he underwent successful CABG revascularization surgery x4 with  arterial conduits with bilateral internal mammary arteries as well as the left radial artery.  He  developed postoperative atrial fibrillation and was on amiodarone which ultimately his dose was reduced and ultimately discontinued.  Presently, he has not been aware of any recurrent arrhythmias and denies any palpitations.  He has not had any anginal symptomatology and he denies exertional dyspnea.  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  His blood pressure today is significantly improved now on his increased medical regimen of amlodipine 5 mg, irbesartan 300 mg, Toprol all tartrate 12.5 mg twice a day and he continues to take isosorbide.  He is on DAPT with aspirin/Plavix.  He has significant mixed hyperlipidemia lipid studies in April 2022 showed total cholesterol 176, HDL 38, LDL 55 and triglycerides 562.  He is on rosuvastatin 40 mg, fenofibrate 145 mg and was recently approved to initiate Vascepa 2 capsules twice a day.  In August 2022 I am scheduling him to undergo a 2D echo Doppler study for reassessment of LV function following his CABG revascularization.  I will also check fasting laboratory with comprehensive metabolic panel, CBC, TSH and lipid studies.  I will see him in September/October for follow-up office evaluation.    Medication Adjustments/Labs and Tests Ordered: Current medicines are reviewed at length with the patient today.  Concerns regarding medicines are outlined above.  Medication changes, Labs and Tests ordered today are listed in the Patient Instructions below. Patient Instructions  Medication Instructions:  Your physician recommends that you continue on your current medications as directed. Please refer to the Current Medication list given to you today.  *If you need a refill on your cardiac medications before your next appointment, please call your pharmacy*   Lab Work: Please return for FASTING labs in August/September (CMET, CBC, Lipid,  TSH)  Our in office lab hours are Monday-Friday 8:00-4:00, closed for lunch 12:45-1:45 pm.  No appointment needed.  Testing/Procedures: Your physician has requested that you have an echocardiogram in AUGUST/SEPTEMBER 2022. Echocardiography is a painless test that uses sound waves to create images of your heart. It provides your doctor with information about the size and shape of your heart and how well your heart's chambers and valves are working. This procedure takes approximately one hour. There are no restrictions for this procedure.  This will be done at our [facility] location:  [place]: At [facility], you and your health needs are our priority.  As part of our continuing mission to provide you with exceptional heart care, we have created designated Provider Care Teams.  These Care Teams include your primary Cardiologist (physician) and Advanced Practice Providers (APPs -  Physician Assistants and Nurse Practitioners) who all work together to provide you with the care you need, when you need it.  We recommend signing up for the patient portal called "MyChart".  Sign up information is provided on this After Visit Summary.  MyChart is used to connect with patients for Virtual Visits (Telemedicine).  Patients are able to view lab/test results, encounter notes, upcoming appointments, etc.  Non-urgent messages can be sent to your provider as well.   To learn more about what you can do with MyChart, go to [email or web address].    Your next appointment:   After echo with Dr. Claiborne Billings     Signed, Shelva Majestic, MD  01/15/2021 12:38 PM    [facility] [address] [place], [place], [place]  [address] Phone: [phone]

## 2021-01-09 NOTE — Patient Instructions (Signed)
Medication Instructions:  Your physician recommends that you continue on your current medications as directed. Please refer to the Current Medication list given to you today.  *If you need a refill on your cardiac medications before your next appointment, please call your pharmacy*   Lab Work: Please return for FASTING labs in August/September (CMET, CBC, Lipid, TSH)  Our in office lab hours are Monday-Friday 8:00-4:00, closed for lunch 12:45-1:45 pm.  No appointment needed.  Testing/Procedures: Your physician has requested that you have an echocardiogram in AUGUST/SEPTEMBER 2022. Echocardiography is a painless test that uses sound waves to create images of your heart. It provides your doctor with information about the size and shape of your heart and how well your heart's chambers and valves are working. This procedure takes approximately one hour. There are no restrictions for this procedure.  This will be done at our [facility] location:  [place]: At [facility], you and your health needs are our priority.  As part of our continuing mission to provide you with exceptional heart care, we have created designated Provider Care Teams.  These Care Teams include your primary Cardiologist (physician) and Advanced Practice Providers (APPs -  Physician Assistants and Nurse Practitioners) who all work together to provide you with the care you need, when you need it.  We recommend signing up for the patient portal called "MyChart".  Sign up information is provided on this After Visit Summary.  MyChart is used to connect with patients for Virtual Visits (Telemedicine).  Patients are able to view lab/test results, encounter notes, upcoming appointments, etc.  Non-urgent messages can be sent to your provider as well.   To learn more about what you can do with MyChart, go to [email or web address].    Your next appointment:   After echo with Dr. Claiborne Billings

## 2021-01-15 ENCOUNTER — Encounter: Payer: Self-pay | Admitting: Cardiovascular Disease

## 2021-04-10 LAB — LIPID PANEL
Chol/HDL Ratio: 4.3 ratio (ref 0.0–5.0)
Cholesterol, Total: 158 mg/dL (ref 100–199)
HDL: 37 mg/dL — ABNORMAL LOW (ref >39)
LDL Chol Calc (NIH): 63 mg/dL (ref 0–99)
Triglycerides: 374 mg/dL — ABNORMAL HIGH (ref 0–149)
VLDL Cholesterol Cal: 58 mg/dL — ABNORMAL HIGH (ref 5–40)

## 2021-04-10 LAB — COMPREHENSIVE METABOLIC PANEL
ALT: 33 IU/L (ref 0–44)
AST: 47 IU/L — ABNORMAL HIGH (ref 0–40)
Albumin/Globulin Ratio: 2 (ref 1.2–2.2)
Albumin: 4.8 g/dL (ref 3.8–4.8)
Alkaline Phosphatase: 46 IU/L (ref 44–121)
BUN/Creatinine Ratio: 13 (ref 10–24)
BUN: 12 mg/dL (ref 8–27)
Bilirubin Total: <0.2 mg/dL (ref 0.0–1.2)
CO2: 20 mmol/L (ref 20–29)
Calcium: 9.5 mg/dL (ref 8.6–10.2)
Chloride: 103 mmol/L (ref 96–106)
Creatinine, Ser: 0.9 mg/dL (ref 0.76–1.27)
Globulin, Total: 2.4 g/dL (ref 1.5–4.5)
Glucose: 177 mg/dL — ABNORMAL HIGH (ref 65–99)
Potassium: 5.3 mmol/L — ABNORMAL HIGH (ref 3.5–5.2)
Sodium: 141 mmol/L (ref 134–144)
Total Protein: 7.2 g/dL (ref 6.0–8.5)
eGFR: 97 mL/min/{1.73_m2} (ref >59)

## 2021-04-10 LAB — CBC
Hematocrit: 43 % (ref 37.5–51.0)
Hemoglobin: 14.2 g/dL (ref 13.0–17.7)
MCH: 29.2 pg (ref 26.6–33.0)
MCHC: 33 g/dL (ref 31.5–35.7)
MCV: 89 fL (ref 79–97)
Platelets: 152 10*3/uL (ref 150–450)
RBC: 4.86 x10E6/uL (ref 4.14–5.80)
RDW: 13.6 % (ref 11.6–15.4)
WBC: 5.4 10*3/uL (ref 3.4–10.8)

## 2021-04-10 LAB — TSH: TSH: 5.81 u[IU]/mL — ABNORMAL HIGH (ref 0.450–4.500)

## 2021-04-26 ENCOUNTER — Other Ambulatory Visit: Payer: Self-pay

## 2021-04-26 ENCOUNTER — Ambulatory Visit (HOSPITAL_COMMUNITY): Payer: BC Managed Care – PPO | Attending: Internal Medicine

## 2021-04-26 DIAGNOSIS — I469 Cardiac arrest, cause unspecified: Secondary | ICD-10-CM | POA: Diagnosis not present

## 2021-04-26 DIAGNOSIS — Z951 Presence of aortocoronary bypass graft: Secondary | ICD-10-CM | POA: Diagnosis not present

## 2021-04-26 DIAGNOSIS — E875 Hyperkalemia: Secondary | ICD-10-CM

## 2021-04-26 DIAGNOSIS — E782 Mixed hyperlipidemia: Secondary | ICD-10-CM

## 2021-04-26 LAB — ECHOCARDIOGRAM COMPLETE
AR max vel: 1.35 cm2
AV Area VTI: 1.58 cm2
AV Area mean vel: 1.51 cm2
AV Mean grad: 16 mmHg
AV Peak grad: 33.4 mmHg
Ao pk vel: 2.89 m/s
Area-P 1/2: 3.89 cm2
S' Lateral: 3.2 cm

## 2021-05-11 NOTE — Progress Notes (Signed)
Cardiology Clinic Note   Patient Name: Richard Hatfield Date of Encounter: 05/13/2021  Primary Care Provider:  Haywood Pao, MD Primary Cardiologist:  Shelva Majestic, MD  Patient Profile    Richard Hatfield 65 year old male presents to the clinic today for follow-up evaluation of his coronary artery disease.  He is status post CABG x4 04/06/20  Past Medical History    Past Medical History:  Diagnosis Date   Cardiac arrest ([place]) 02/28/2020   Coronary artery disease 02/2020   pt admitted for STEMI in 02/2020 (per pt)   Diabetes mellitus without complication ([place])    Hypertension    Myocardial infarction ([place])    per pt he had a heart attack earlier (02/2020) - see encounters   Past Surgical History:  Procedure Laterality Date   CORONARY ARTERY BYPASS GRAFT N/A 04/06/2020   Procedure: CORONARY ARTERY BYPASS GRAFTING (CABG), ON PUMP, TIMES FOUR, USING BILATERAL INTERNAL MAMMARY ARTERIES AND HARVESTED LEFT RADIAL ARTERY (OPEN);  Surgeon: Wonda Olds, MD;  Location: [place];  Service: Open Heart Surgery;  Laterality: N/A;   LEFT HEART CATH AND CORONARY ANGIOGRAPHY N/A 02/28/2020   Procedure: LEFT HEART CATH AND CORONARY ANGIOGRAPHY;  Surgeon: Troy Sine, MD;  Location: [place] [facility];  Service: Cardiovascular;  Laterality: N/A;   RADIAL ARTERY HARVEST Left 04/06/2020   Procedure: RADIAL ARTERY HARVEST;  Surgeon: Wonda Olds, MD;  Location: [place];  Service: Open Heart Surgery;  Laterality: Left;   TEE WITHOUT CARDIOVERSION N/A 04/06/2020   Procedure: TRANSESOPHAGEAL ECHOCARDIOGRAM (TEE);  Surgeon: Wonda Olds, MD;  Location: [place];  Service: Open Heart Surgery;  Laterality: N/A;    Allergies  No Known Allergies  History of Present Illness    Mr. Johnson has a past medical history of cardiac arrest, ACS, coronary artery disease, type 2 diabetes, acute encephalopathy, anoxic brain injury, AKI, transaminitis, CABG x4 on 04/06/2020   He presented to the  hospital after unexpected cardiac arrest at home 02/28/2020.  He received 20 minutes of CPR with return of ROSC.  He was transported to [facility] and underwent emergent cardiac catheterization which showed severe multivessel coronary artery disease.  He was noted to have suffered significant anoxic brain injury.  He was discharged to inpatient rehab and had significant improvement.  He returned to his baseline with his cognitive and physical conditioning.  He followed up with Dr. Orvan Seen to discuss CABG.  He eventually underwent CABG x4 on 04/06/2020.  He was discharged on 04/11/2020 in stable condition.  He followed up with Dr. Orvan Seen on 04/16/2020 he was doing well at that time, his sternal precautions were continued, CXR showed no acute changes, and he denied chest pain.   He presented to the clinic on 9/21 for follow-up evaluation and stated he felt well. He continued to slowly increase physical activity at home. He had been helping his wife with light housework tasks such as sweeping, and watering plants. 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 He had not been cleared by [facility] yet to drive. He denied side effects with his medication and had not noticed any palpitations. He had been arranging holiday photos and doing active memory type tasks. He did not notice any memory impairment at the time. I will gave him the salty 6 diet sheet and planned follow-up in 3 months.   He  was seen by Dr. Claiborne Billings on 09/10/2020.  He denied angina at that time.  He denied any recurrent episodes of atrial fibrillation.  His blood pressure was slightly elevated and his irbesartan was increased to 75 mg daily.  He maintained sinus rhythm his amiodarone was decreased to 100 mg daily.  Plan was discussed to discontinue amiodarone at follow-up visit and titrate beta-blocker.  His pulse was 51 at that time.   He  presented to the clinic 11/20/20 for follow-up evaluation and stated he felt well.  He had not noticed any further episodes of increased heart rate or irregular heart rate.  His blood pressure had been well controlled at home and his blood pressure on reevaluation was 128/76.  He reported that he had been following a heart healthy diet and increasing his physical activity.  He had been push mowing his lawn.  He reported that he was also taking extra steps at work.  His EKG  showed normal sinus rhythm.  His amiodarone was continued, I asked have him to increase his physical activity as tolerated, continue his heart healthy low-sodium diet, and follow-up with Dr. Claiborne Billings as scheduled.   He was seen by Dr. Claiborne Billings on 01/09/2021.  During that time he denied chest pain shortness of breath.  He continued to feel well.  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  He reported compliance with his Vascepa, rosuvastatin, and fenofibrate.  He reported that he continued to feel well.  His carotid Doppler study 04/04/2020 showed 1-39% bilateral carotid stenosis.  His echocardiogram 04/26/2021 showed normal LV function, G1 DD and mild aortic stenosis.  He presents the clinic today for follow-up evaluation and states he feels well.  He continues to be physically active at home doing yard work.  He reports that he is still following a heart healthy low-salt diet.  He is eating normally chicken and [place] type protein.  He has not been checking his blood pressure regularly.  We reviewed his echocardiogram and cardiac ultrasound.  He expressed understanding.  I will give him a blood pressure log, continue his current medication regimen, and have him increase his physical activity as tolerated.  We will plan follow-up for 6 to 8 months.  Today he denies chest pain, shortness of breath, lower extremity edema, fatigue, palpitations, melena, hematuria, hemoptysis, diaphoresis, weakness,  presyncope, syncope, orthopnea, and PND.  Home Medications    Prior to Admission medications   Medication Sig Start Date End Date Taking? Authorizing Provider  acetaminophen (TYLENOL) 325 MG tablet Take 1-2 tablets (325-650 mg total) by mouth every 4 (four) hours as needed for mild pain. 03/20/20   Love, Ivan Anchors, PA-C  amLODipine (NORVASC) 5 MG tablet Take 1 tablet by mouth daily.    [provider]  aspirin 81 MG chewable tablet Chew 1 tablet (81 mg total) by mouth daily. 03/09/20   Alma Friendly, MD  calcium carbonate (TUMS - DOSED IN MG ELEMENTAL CALCIUM) 500 MG chewable tablet Chew 1 tablet (200 mg of elemental calcium total) by mouth 2 (two) times daily. Patient taking differently: Chew 1 tablet by mouth 3 (three) times daily. 03/20/20   Love, Ivan Anchors, PA-C  clopidogrel (PLAVIX) 75 MG tablet Take 1 tablet (75 mg total) by mouth daily. 04/10/20   Nani Skillern, PA-C  empagliflozin (JARDIANCE) 25 MG TABS tablet Take 1 tablet (25 mg total) by mouth daily. 03/20/20   Bary Leriche, PA-C  EPINEPHrine 0.3 mg/0.3 mL IJ SOAJ injection Inject 1 mL into the skin See admin instructions. 02/24/11   [provider]  fenofibrate (TRICOR) 145 MG tablet Take 1 tablet by mouth daily. 12/09/18   [provider]  glucose blood (ONETOUCH VERIO) test strip 1 each by Other route daily. 04/17/15   [provider]  insulin aspart (NOVOLOG) 100 UNIT/ML injection Inject 12 Units into the skin 3 (three) times daily before meals.    [provider]  insulin degludec (TRESIBA) 100 UNIT/ML FlexTouch Pen Inject 0.18 mLs (18 Units total) into the skin 2 (two) times daily. Patient taking differently: Inject 32 Units into the skin 2 (two) times daily. 03/20/20   Love, Ivan Anchors, PA-C  Insulin Pen Needle (B-D ULTRAFINE III SHORT PEN) 31G X 8 MM MISC USE 4 TIMES A DAY AS DIRECTED 01/20/19   [provider]  irbesartan (AVAPRO) 300 MG tablet Take 1 tablet (300 mg total) by  mouth daily. 01/04/21   Deberah Pelton, NP  isosorbide dinitrate (ISORDIL) 10 MG tablet Take 1 tablet (10 mg total) by mouth 3 (three) times daily. For one month then stop. 04/10/20   Lars Pinks M, PA-C  Lancets (ONETOUCH DELICA PLUS [id number]) MISC USE TO SELF MONITOR BLOOD GLUCOSE THREE TIMES DAILY    [provider]  metoprolol tartrate (LOPRESSOR) 25 MG tablet Take 0.5 tablets (12.5 mg total) by mouth 2 (two) times daily. 04/11/20   Nani Skillern, PA-C  Multiple Vitamin (MULTIVITAMIN WITH MINERALS) TABS tablet Take 1 tablet by mouth daily. 03/20/20   Love, Ivan Anchors, PA-C  Omega-3 Fatty Acids (OMEGA-3 CF) 1000 MG CAPS Take 2 capsules (2,000 mg total) by mouth in the morning and at bedtime. 12/21/20   Deberah Pelton, NP  pantoprazole (PROTONIX) 20 MG tablet Take 1 tablet (20 mg total) by mouth daily. 03/20/20   Love, Ivan Anchors, PA-C  polyethylene glycol (MIRALAX / GLYCOLAX) 17 g packet Take 17 g by mouth 2 (two) times daily. Patient taking differently: Take 17 g by mouth daily as needed for moderate constipation. 03/20/20   Love, Ivan Anchors, PA-C  rosuvastatin (CRESTOR) 40 MG tablet Take 1 tablet (40 mg total) by mouth daily. 03/20/20   Love, Ivan Anchors, PA-C  traMADol (ULTRAM) 50 MG tablet Take 1 tablet (50 mg total) by mouth every 6 (six) hours as needed for moderate pain. 04/11/20   Nani Skillern, PA-C  traZODone (DESYREL) 50 MG tablet Take 0.5-1 tablets (25-50 mg total) by mouth at bedtime as needed for sleep. 03/20/20   LoveIvan Anchors, PA-C    Family History    No family history on file. has no family status information on file.   Social History    Social History   Socioeconomic History   Marital status: Married    Spouse name: Nikola Marone   Number of children: Not on file   Years of education: Not on file   Highest education level: Not on file  Occupational History   Not on file  Tobacco Use   Smoking status: Never   Smokeless tobacco: Never  Vaping Use    Vaping Use: Never used  Substance and Sexual Activity   Alcohol use: Not Currently   Drug use: Never   Sexual activity: Not Currently    Partners: Female  Other Topics Concern   Not on file  Social History Narrative   Not on file   Social Determinants of Health   Financial Resource  Strain: Not on file  Food Insecurity: Not on file  Transportation Needs: Not on file  Physical Activity: Not on file  Stress: Not on file  Social Connections: Not on file  Intimate Partner Violence: Not on file     Review of Systems    General:  No chills, fever, night sweats or weight changes.  Cardiovascular:  No chest pain, dyspnea on exertion, edema, orthopnea, palpitations, paroxysmal nocturnal dyspnea. Dermatological: No rash, lesions/masses Respiratory: No cough, dyspnea Urologic: No hematuria, dysuria Abdominal:   No nausea, vomiting, diarrhea, bright red blood per rectum, melena, or hematemesis Neurologic:  No visual changes, wkns, changes in mental status. All other systems reviewed and are otherwise negative except as noted above.  Physical Exam    VS:  BP 138/76   Pulse 74   Ht 5' 8.5" (1.74 m)   Wt 208 lb 12.8 oz (94.7 kg)   SpO2 97%   BMI 31.29 kg/m  , BMI Body mass index is 31.29 kg/m. GEN: Well nourished, well developed, in no acute distress. HEENT: normal. Neck: Supple, no JVD, carotid bruits, or masses. Cardiac: RRR, no murmurs, rubs, or gallops. No clubbing, cyanosis, edema.  Radials/DP/PT 2+ and equal bilaterally.  Respiratory:  Respirations regular and unlabored, clear to auscultation bilaterally. GI: Soft, nontender, nondistended, BS + x 4. MS: no deformity or atrophy. Skin: warm and dry, no rash. Neuro:  Strength and sensation are intact. Psych: Normal affect.  Accessory Clinical Findings    Recent Labs: 04/10/2021: ALT 33; BUN 12; Creatinine, Ser 0.90; Hemoglobin 14.2; Platelets 152; Potassium 5.3; Sodium 141; TSH 5.810   Recent Lipid Panel    Component  Value Date/Time   CHOL 158 04/10/2021 0856   TRIG 374 (H) 04/10/2021 0856   HDL 37 (L) 04/10/2021 0856   CHOLHDL 4.3 04/10/2021 0856   CHOLHDL NOT REPORTED DUE TO HIGH TRIGLYCERIDES 02/28/2020 0126   VLDL UNABLE TO CALCULATE IF TRIGLYCERIDE OVER 400 mg/dL 02/28/2020 0126   LDLCALC 63 04/10/2021 0856   LDLDIRECT 61.9 02/28/2020 0126    ECG personally reviewed by me today-none today.  EKG 11/20/2020 normal sinus rhythm possible left atrial enlargement T wave abnormality consider lateral ischemia 61 bpm- No acute changes   EKG 04/24/2020 normal sinus rhythm T wave abnormality consider anterior lateral ischemia 65 bpm- No acute changes   Echocardiogram 02/28/2020 IMPRESSIONS     1. Normal wall motion in visualized segments. Mid to distal anterolateral  wall not well visualized on apical images. Consider repeat with echo  contrast for wall motion when patient less agitated.. Left ventricular  ejection fraction, by estimation, is 50   to 55%. The left ventricle has low normal function. The left ventricle  has no regional wall motion abnormalities. There is mild concentric left  ventricular hypertrophy. Left ventricular diastolic parameters were  normal.   2. Right ventricular systolic function is normal. The right ventricular  size is normal. There is mildly elevated pulmonary artery systolic  pressure.   3. Left atrial size was mildly dilated.   4. The mitral valve is normal in structure. Trivial mitral valve  regurgitation. No evidence of mitral stenosis.   5. The aortic valve has an indeterminant number of cusps. Aortic valve  regurgitation is not visualized. Mild aortic valve stenosis.   Comparison(s): No prior Echocardiogram.   Echocardiogram 04/26/2021 IMPRESSIONS     1. Left ventricular ejection fraction, by estimation, is 60 to 65%. The  left ventricle has normal function. The left ventricle has no  regional  wall motion abnormalities. There is moderate left ventricular  hypertrophy.  Left ventricular diastolic  parameters are consistent with Grade I diastolic dysfunction (impaired  relaxation).   2. Right ventricular systolic function is normal. The right ventricular  size is normal. There is normal pulmonary artery systolic pressure. The  estimated right ventricular systolic pressure is 78.5 mmHg.   3. Left atrial size was mildly dilated.   4. The mitral valve is grossly normal. Trivial mitral valve  regurgitation.   5. The aortic valve is tricuspid. There is mild calcification of the  aortic valve. There is mild thickening of the aortic valve. Aortic valve  regurgitation is not visualized. Mild aortic valve stenosis. Aortic valve  area, by VTI measures 1.58 cm.  Aortic valve mean gradient measures 16.0 mmHg. Aortic valve Vmax measures  2.89 m/s. Peak gradient 33 mmHg and DI is 0.46.   6. The inferior vena cava is normal in size with greater than 50%  respiratory variability, suggesting right atrial pressure of 3 mmHg.   Comparison(s): Changes from prior study are noted. 02/28/2020: LVEF 50-55%,  mild AS - mean gradient 8 mmHg.   FINDINGS   Left Ventricle: Left ventricular ejection fraction, by estimation, is 60  to 65%. The left ventricle has normal function. The left ventricle has no  regional wall motion abnormalities. The left ventricular internal cavity  size was normal in size. There is   moderate left ventricular hypertrophy. Left ventricular diastolic  parameters are consistent with Grade I diastolic dysfunction (impaired  relaxation). Indeterminate filling pressures.   Right Ventricle: The right ventricular size is normal. No increase in  right ventricular wall thickness. Right ventricular systolic function is  normal. There is normal pulmonary artery systolic pressure. The tricuspid  regurgitant velocity is 2.55 m/s, and   with an assumed right atrial pressure of 3 mmHg, the estimated right  ventricular systolic pressure is 88.5 mmHg.    Left Atrium: Left atrial size was mildly dilated.   Right Atrium: Right atrial size was normal in size.   Pericardium: There is no evidence of pericardial effusion.   Mitral Valve: The mitral valve is grossly normal. Trivial mitral valve  regurgitation.   Tricuspid Valve: The tricuspid valve is grossly normal. Tricuspid valve  regurgitation is trivial.   Aortic Valve: The aortic valve is tricuspid. There is mild calcification  of the aortic valve. There is mild thickening of the aortic valve. Aortic  valve regurgitation is not visualized. Mild aortic stenosis is present.  Aortic valve mean gradient measures   16.0 mmHg. Aortic valve peak gradient measures 33.4 mmHg. Aortic valve  area, by VTI measures 1.58 cm.   Pulmonic Valve: The pulmonic valve was normal in structure. Pulmonic valve  regurgitation is not visualized.   Aorta: The aortic root and ascending aorta are structurally normal, with  no evidence of dilitation.   Venous: The inferior vena cava is normal in size with greater than 50%  respiratory variability, suggesting right atrial pressure of 3 mmHg.   IAS/Shunts: No atrial level shunt detected by color flow Doppler.  Assessment & Plan   1.  Coronary artery disease/status post CABG x4-continues to deny chest pain.  Feels well with continued normal activities.  Echocardiogram 04/26/2021 showed normal LVEF, G1 DD, and stable mild aortic stenosis Continue aspirin, Plavix, Jardiance, rosuvastatin, Vascepa Heart healthy low-sodium diet-salty 6 given Maintain physical activity as tolerated   Postoperative atrial fibrillation-heart rate today 74 bpm.  Denies recent episodes of accelerated  or irregular heartbeat.   Continue  metoprolol Avoid triggers caffeine, chocolate, EtOH etc. Heart healthy low-sodium diet-salty 6 given Increase physical activity as tolerated   Essential hypertension-BP today 138/76.  He has not been monitoring at home. Continue irbesartan,  metoprolol, Heart healthy low-sodium diet-salty 6 given Increase physical activity as tolerated Maintain blood pressure log  Hyperlipidemia-04/10/2021: Cholesterol, Total 158; HDL 37; LDL Chol Calc (NIH) 63; Triglycerides 374 Continue rosuvastatin, fenofibrate, Vascepa Heart healthy low-sodium high-fiber diet Repeat fasting lipids and liver   Type 2 diabetes-blood glucose 161 on 04/08/2020. Continue Jardiance, insulin Heart healthy low-sodium carb modified diet Increase physical activity as tolerated   Disposition: Follow-up with Dr. Claiborne Billings in 6-8 months.   Jossie Ng. Rabecca Birge NP-C    05/13/2021, 8:45 AM [place] [address] Office [phone] Fax [phone]  Notice: This dictation was prepared with Dragon dictation along with smaller phrase technology. Any transcriptional errors that result from this process are unintentional and may not be corrected upon review.  I spent 14 minutes examining this patient, reviewing medications, and using patient centered shared decision making involving her cardiac care.  Prior to her visit I spent greater than 20 minutes reviewing her past medical history,  medications, and prior cardiac tests.

## 2021-05-13 ENCOUNTER — Encounter: Payer: Self-pay | Admitting: General Practice

## 2021-05-13 ENCOUNTER — Other Ambulatory Visit: Payer: Self-pay

## 2021-05-13 ENCOUNTER — Ambulatory Visit: Payer: BC Managed Care – PPO | Admitting: General Practice

## 2021-05-13 VITALS — BP 138/76 | HR 74 | Ht 68.5 in | Wt 208.8 lb

## 2021-05-13 DIAGNOSIS — I10 Essential (primary) hypertension: Secondary | ICD-10-CM

## 2021-05-13 DIAGNOSIS — I4891 Unspecified atrial fibrillation: Secondary | ICD-10-CM

## 2021-05-13 DIAGNOSIS — E785 Hyperlipidemia, unspecified: Secondary | ICD-10-CM

## 2021-05-13 DIAGNOSIS — E118 Type 2 diabetes mellitus with unspecified complications: Secondary | ICD-10-CM

## 2021-05-13 DIAGNOSIS — Z794 Long term (current) use of insulin: Secondary | ICD-10-CM

## 2021-05-13 DIAGNOSIS — Z951 Presence of aortocoronary bypass graft: Secondary | ICD-10-CM

## 2021-05-13 NOTE — Patient Instructions (Signed)
Medication Instructions:  The current medical regimen is effective;  continue present plan and medications as directed. Please refer to the Current Medication list given to you today.   *If you need a refill on your cardiac medications before your next appointment, please call your pharmacy*  Lab Work:   Testing/Procedures:  NONE    NONE  Special Instructions PLEASE READ AND FOLLOW SALTY 6-ATTACHED-1,800mg  daily  PLEASE STAY ACTIVE  TAKE AND LOG YOUR BLOOD PRESSURE AT LEAST 3-4 TIMES A MONTH  Follow-Up: Your next appointment:  6-8 month(s) In Person with Shelva Majestic, MD   At [facility], you and your health needs are our priority.  As part of our continuing mission to provide you with exceptional heart care, we have created designated Provider Care Teams.  These Care Teams include your primary Cardiologist (physician) and Advanced Practice Providers (APPs -  Physician Assistants and Nurse Practitioners) who all work together to provide you with the care you need, when you need it.  We recommend signing up for the patient portal called "MyChart".  Sign up information is provided on this After Visit Summary.  MyChart is used to connect with patients for Virtual Visits (Telemedicine).  Patients are able to view lab/test results, encounter notes, upcoming appointments, etc.  Non-urgent messages can be sent to your provider as well.   To learn more about what you can do with MyChart, go to [email or web address].              6 SALTY THINGS TO AVOID     1,800MG  DAILY

## 2021-11-01 ENCOUNTER — Telehealth: Payer: Self-pay | Admitting: Cardiovascular Disease

## 2021-11-01 NOTE — Telephone Encounter (Signed)
Contacted wife- she states that she just wanted [place] to know a few things before the appointment. ?Patient has had a change in: ?Level of frustration- he gets angry when he can not do things like he did. ?Level of energy- he is fatigued and states he just is not acting like he wants to do something and is ready to go to bed at 8:30 PM at night and sleep all night.  ? ?She states he is a lot different since his heart attack, she just wanted to make Dr.Kelly aware of this for the appointment on 03/27, she feels he may never be like he was before the heart attack.  ? ? ? ? ?

## 2021-11-01 NOTE — Telephone Encounter (Signed)
Patient's wife would like to speak to nurse before he comes into his appt on 3/27.  Wife has some concerns she is afraid her husband will not bring up at the visit.  ?

## 2021-11-11 ENCOUNTER — Encounter: Payer: Self-pay | Admitting: Cardiovascular Disease

## 2021-11-11 ENCOUNTER — Ambulatory Visit: Payer: BC Managed Care – PPO | Admitting: Cardiovascular Disease

## 2021-11-11 ENCOUNTER — Other Ambulatory Visit: Payer: Self-pay

## 2021-11-11 DIAGNOSIS — Z794 Long term (current) use of insulin: Secondary | ICD-10-CM

## 2021-11-11 DIAGNOSIS — Z951 Presence of aortocoronary bypass graft: Secondary | ICD-10-CM | POA: Diagnosis not present

## 2021-11-11 DIAGNOSIS — I2510 Atherosclerotic heart disease of native coronary artery without angina pectoris: Secondary | ICD-10-CM | POA: Diagnosis not present

## 2021-11-11 DIAGNOSIS — E782 Mixed hyperlipidemia: Secondary | ICD-10-CM

## 2021-11-11 DIAGNOSIS — E118 Type 2 diabetes mellitus with unspecified complications: Secondary | ICD-10-CM

## 2021-11-11 DIAGNOSIS — I10 Essential (primary) hypertension: Secondary | ICD-10-CM | POA: Diagnosis not present

## 2021-11-11 MED ORDER — AMLODIPINE BESYLATE 5 MG PO TABS: 7.5000 mg | ORAL_TABLET | Freq: Every day | ORAL | 11 refills | Status: DC

## 2021-11-11 NOTE — Patient Instructions (Signed)
Medication Instructions:  ?INCREASE the Amlodipine to 7.5 mg (one and half tablets) once daily ?*If you need a refill on your cardiac medications before your next appointment, please call your pharmacy* ? ? ?Lab Work: ?None ordered ?If you have labs (blood work) drawn today and your tests are completely normal, you will receive your results only by: ?MyChart Message (if you have MyChart) OR ?A paper copy in the mail ?If you have any lab test that is abnormal or we need to change your treatment, we will call you to review the results. ? ? ?Testing/Procedures: ?None ordered ? ? ?Follow-Up: ?At [facility], you and your health needs are our priority.  As part of our continuing mission to provide you with exceptional heart care, we have created designated Provider Care Teams.  These Care Teams include your primary Cardiologist (physician) and Advanced Practice Providers (APPs -  Physician Assistants and Nurse Practitioners) who all work together to provide you with the care you need, when you need it. ? ?We recommend signing up for the patient portal called "MyChart".  Sign up information is provided on this After Visit Summary.  MyChart is used to connect with patients for Virtual Visits (Telemedicine).  Patients are able to view lab/test results, encounter notes, upcoming appointments, etc.  Non-urgent messages can be sent to your provider as well.   ?To learn more about what you can do with MyChart, go to [email or web address].   ? ?Your next appointment:   ?6 month(s) ? ?The format for your next appointment:   ?In Person ? ?Provider:   ?Shelva Majestic, MD { ? ?

## 2021-11-11 NOTE — Progress Notes (Signed)
? ?Cardiology Office Note   ? ?Date:  11/17/2021  ? ?ID:  Richard Hatfield, DOB 02/12/1956, MRN [id number] ?  ?PCP:  Dr.Tisovec ?Cardiologist:  Shelva Majestic, MD  ? ?63-monthcardiology follow-up evaluation ? ?History of Present Illness:  ?Richard DEUTSCHERis a 66y.o. male who has a history of hypertension, hyperlipidemia, and diabetes mellitus [facility] developing an out of hospital cardiac arrest witnessed by his wife.  CPR was initiated promptly and received approximately 20 minutes of CPR and 2 rounds of epinephrine and defibrillations by EMS.  Cardiac catheterization revealed significant multivessel CAD with wall motion demonstrating hypocontractility in the anterolateral wall.  He had suffered initial anoxic encephalopathy and following stabilization went to inpatient rehab with significant improvement.  He returned to his baseline cognitive function.  Ultimately he underwent CABG revascularization surgery x4 in April 06, 2020 by Dr. AJulien Girtwith a LIMA to LAD, left radial to the diagonal and marginal, and RIMA to the posterior lateral branch of the RCA.  Subsequently, he has done well.  He was seen by JColetta Memoson April 24, 2020 for initial office evaluation.  He had developed postoperative atrial fibrillation for which he was treated with amiodarone.  ? ?I saw him for my initial cardiology office visit following his cardiac arrest and bypass surgery in January 2022.  At that time he was on  amiodarone at 200 mg daily and denied any recurrence of arrhythmia.  He continued to be on aspirin/Plavix.  He was on rosuvastatin for hyperlipidemia.  He continued also to be on irbesartan 37.5 mg, isosorbide dinitrate 10 mg 3 times a day in addition to 12.5 mg twice a day of metoprolol.  He is diabetic on insulin and Jardiance.  Is only he denies any recurrent anginal symptoms.  He does admit to being tired more easily.  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  During my evaluation, he was maintaining sinus rhythm but was bradycardic with a pulse of 51.  His blood pressure was also elevated.  I recommended titration of irbesartan to 75 mg daily and that he decrease amiodarone to 100 mg. ? ?He was evaluated by JColetta Memos NP on November 20, 2020 at which time he was stable and [facility].  LDL was 55 but triglycerides remain very elevated and he was prescribed Vascepa but this had initially been denied at pharmacy but ultimately required prior authorization which was submitted by her pharmacist.  Also his blood pressure was elevated and irbesartan was further titrated. ? ?I last saw him on Jan 09, 2021 at which time he continued to feel well and denied any recurrent chest pain or shortness of breath.   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  Her blood pressure and CAD he is on amlodipine 5 mg, irbesartan 3 mg daily, metoprolol titrate 12.5 mg twice a day and isosorbide.  He is no longer on amiodarone.  He is diabetic on insulin in addition to [place]  I recommended that he have a follow-up echo Doppler study in August 2022 and follow-up laboratory. ? ?He underwent an echo  Doppler study in April 26, 2021 which now showed an EF of 60 to 65%.  There was mild aortic stenosis with a mean gradient of 16 and peak gradient of 33.4.  Estimated aortic valve area is 1.58 cm?. ? ?He was subsequently evaluated by Coletta Memos on May 13, 2021 and remained stable. ? ?Presently, he feels well.  He denies any chest pain or shortness of breath.  He has continued to be on amlodipine 5 mg, metoprolol tartrate 12.5 mg twice a day and irbesartan 300 mg daily for  hypertension.  He is on rosuvastatin 40 mg and fenofibrate for hyperlipidemia.  He is diabetic on insulin and Jardiance.  He continues to be on DAPT with aspirin/Plavix and is on pantoprazole for GERD.  He presents for evaluation. ? ?Past Medical History:  ?Diagnosis Date  ? Cardiac arrest ([place]) 02/28/2020  ? Coronary artery disease 02/2020  ? pt admitted for STEMI in 02/2020 (per pt)  ? Diabetes mellitus without complication ([place])   ? Hypertension   ? Myocardial infarction [facility])   ? per pt he had a heart attack earlier (02/2020) - see encounters  ? ? ?Past Surgical History:  ?Procedure Laterality Date  ? CORONARY ARTERY BYPASS GRAFT N/A 04/06/2020  ? Procedure: CORONARY ARTERY BYPASS GRAFTING (CABG), ON PUMP, TIMES FOUR, USING BILATERAL INTERNAL MAMMARY ARTERIES AND HARVESTED LEFT RADIAL ARTERY (OPEN);  Surgeon: Wonda Olds, MD;  Location: [place];  Service: Open Heart Surgery;  Laterality: N/A;  ? LEFT HEART CATH AND CORONARY ANGIOGRAPHY N/A 02/28/2020  ? Procedure: LEFT HEART CATH AND CORONARY ANGIOGRAPHY;  Surgeon: Troy Sine, MD;  Location: [place] CV LAB;  Service: Cardiovascular;  Laterality: N/A;  ? RADIAL ARTERY HARVEST Left 04/06/2020  ? Procedure: RADIAL ARTERY HARVEST;  Surgeon: Wonda Olds, MD;  Location: [place];  Service: Open Heart Surgery;  Laterality: Left;  ? TEE WITHOUT CARDIOVERSION N/A 04/06/2020  ? Procedure: TRANSESOPHAGEAL ECHOCARDIOGRAM (TEE);  Surgeon: Wonda Olds, MD;  Location: [place];  Service: Open Heart Surgery;  Laterality: N/A;  ? ? ?Current Medications: ?Outpatient Medications Prior to Visit  ?Medication Sig Dispense Refill  ? acetaminophen (TYLENOL) 325 MG tablet Take 1-2 tablets (325-650 mg total) by mouth every 4 (four) hours as needed for mild pain.    ? aspirin 81 MG chewable tablet Chew 1 tablet (81 mg total) by mouth daily.    ? calcium carbonate (TUMS - DOSED IN MG ELEMENTAL CALCIUM) 500 MG chewable tablet Chew 1 tablet (200 mg of elemental calcium total) by  mouth 2 (two) times daily. (Patient taking differently: Chew 1 tablet by mouth 3 (three) times daily.) 60 tablet 0  ? clopidogrel (PLAVIX) 75 MG tablet Take 1 tablet (75 mg total) by mouth daily. 30 tablet 0  ? empagliflozin (JARDIANCE) 25 MG TABS tablet Take 1 tablet (25 mg total) by mouth daily. 30 tablet 0  ? EPINEPHrine 0.3 mg/0.3 mL IJ SOAJ injection Inject 1 mL into the skin See admin instructions.    ? fenofibrate (TRICOR) 145 MG tablet Take 1 tablet by mouth daily.    ? glucose blood (ONETOUCH VERIO) test strip 1 each by Other route daily.    ? insulin aspart (NOVOLOG) 100 UNIT/ML injection Inject 12 Units into the skin 3 (three) times daily before meals.    ? insulin degludec (TRESIBA) 100 UNIT/ML FlexTouch Pen Inject 0.18 mLs (18 Units total) into the skin 2 (two) times daily. (Patient taking differently: Inject 32 Units into the  skin 2 (two) times daily.)    ? Insulin Pen Needle (B-D ULTRAFINE III SHORT PEN) 31G X 8 MM MISC USE 4 TIMES A DAY AS DIRECTED    ? irbesartan (AVAPRO) 300 MG tablet Take 1 tablet (300 mg total) by mouth daily. 90 tablet 1  ? isosorbide dinitrate (ISORDIL) 10 MG tablet Take 1 tablet (10 mg total) by mouth 3 (three) times daily. For one month then stop. 90 tablet 0  ? LAGEVRIO 200 MG CAPS capsule SMARTSIG:4 Capsule(s) By Mouth Every 12 Hours    ? Lancets (ONETOUCH DELICA PLUS [id number]) MISC USE TO SELF MONITOR BLOOD GLUCOSE THREE TIMES DAILY    ? metoprolol tartrate (LOPRESSOR) 25 MG tablet Take 0.5 tablets (12.5 mg total) by mouth 2 (two) times daily. 30 tablet 1  ? Multiple Vitamin (MULTIVITAMIN WITH MINERALS) TABS tablet Take 1 tablet by mouth daily.    ? Omega-3 Fatty Acids (OMEGA-3 CF) 1000 MG CAPS Take 2 capsules (2,000 mg total) by mouth in the morning and at bedtime. 60 capsule 0  ? pantoprazole (PROTONIX) 20 MG tablet Take 1 tablet (20 mg total) by mouth daily. 30 tablet 0  ? polyethylene glycol (MIRALAX / GLYCOLAX) 17 g packet Take 17 g by mouth 2 (two) times daily.  (Patient taking differently: Take 17 g by mouth daily as needed for moderate constipation.) 60 each 0  ? rosuvastatin (CRESTOR) 40 MG tablet Take 1 tablet (40 mg total) by mouth daily. 30 tablet 0  ? amLOD

## 2021-11-17 ENCOUNTER — Encounter: Payer: Self-pay | Admitting: Cardiovascular Disease

## 2022-01-25 IMAGING — CT CT HEAD W/O CM
4 series · 17 of 47 positions shown, 19 images · non-contrast
Comparison: 03/04/2020, 03/03/2020

CLINICAL DATA: Traumatic brain injury, change in neurologic status,
history of anoxic brain injury

EXAM:
CT HEAD WITHOUT CONTRAST
TECHNIQUE: Contiguous axial images were obtained from the base of the skull
through the vertex without intravenous contrast.

[Series 3: head wo · axial · 0.43mm/px · z∈[+1276,+1410]mm · 7 of 37 slices shown, 9 images]
[im 5/37  brain]
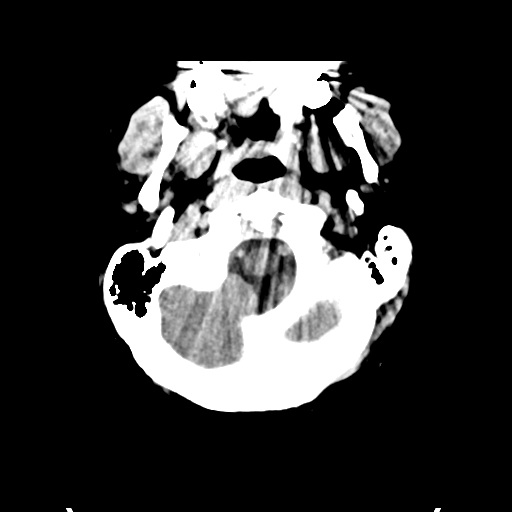
[im 5/37  bone]
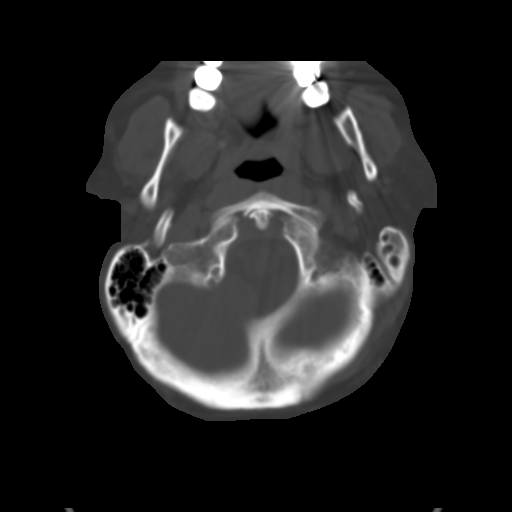
[im 10/37  brain]
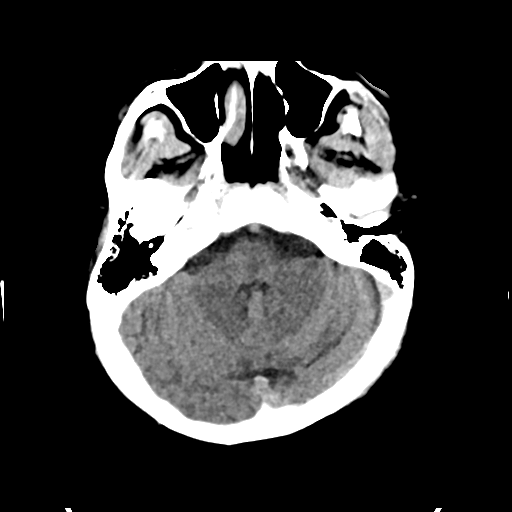
[im 14/37  brain]
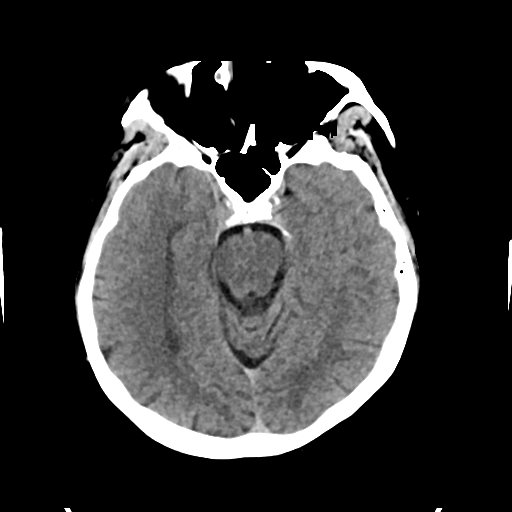
[im 19/37  brain]
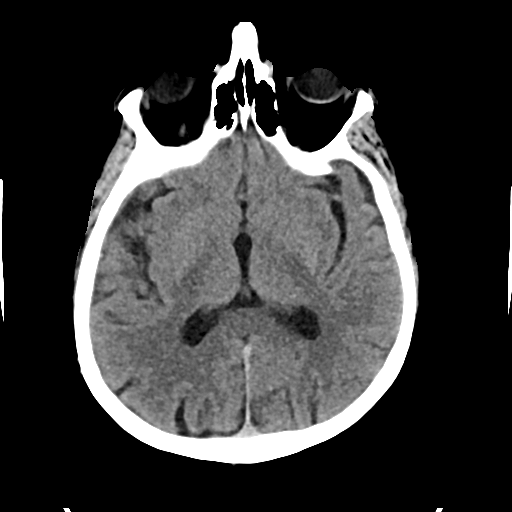
[im 23/37  brain]
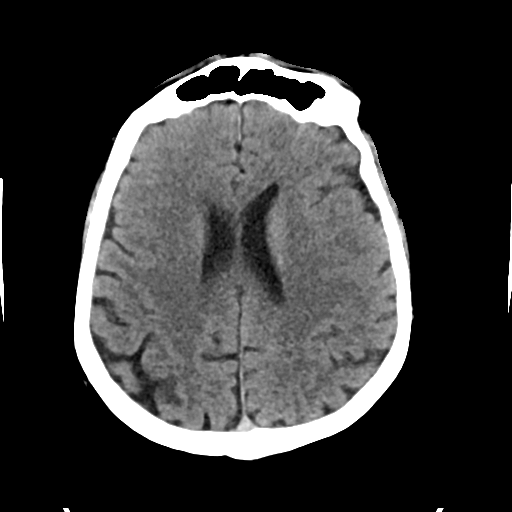
[im 23/37  bone]
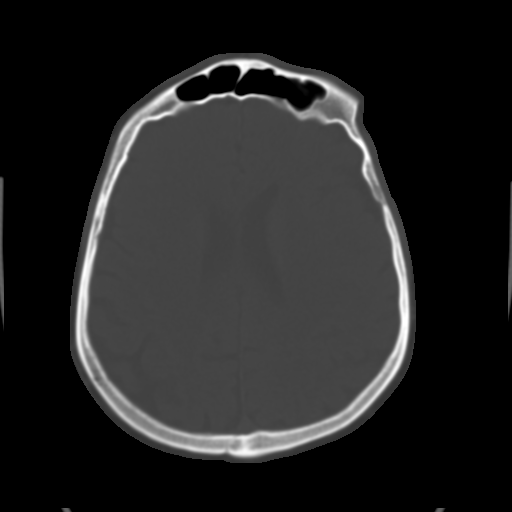
[im 28/37  brain]
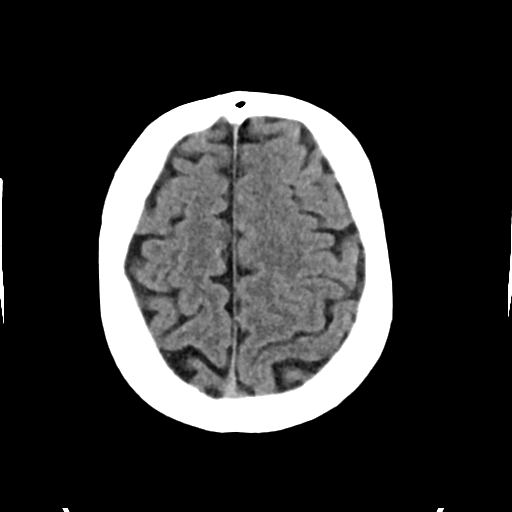
[im 32/37  brain]
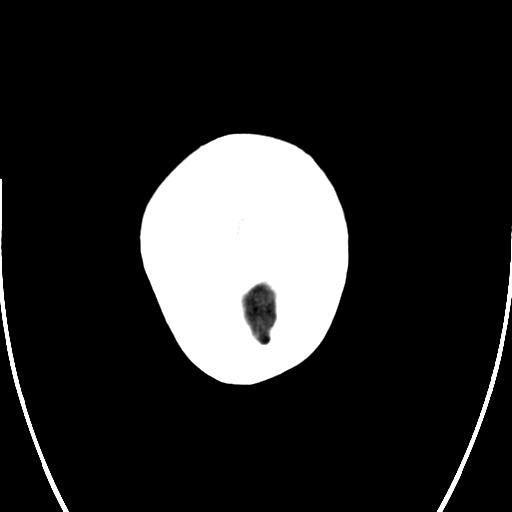

[Series 4: head bone · axial · 0.43mm/px · z∈[+1274,+1336]mm · 4 of 92 slices shown]
[im 10/92  bone]
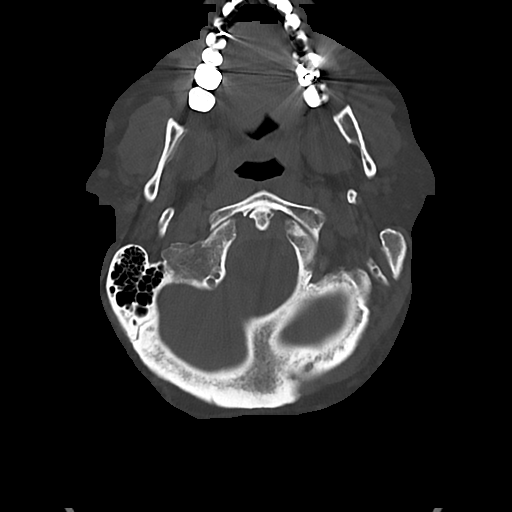
[im 19/92  bone]
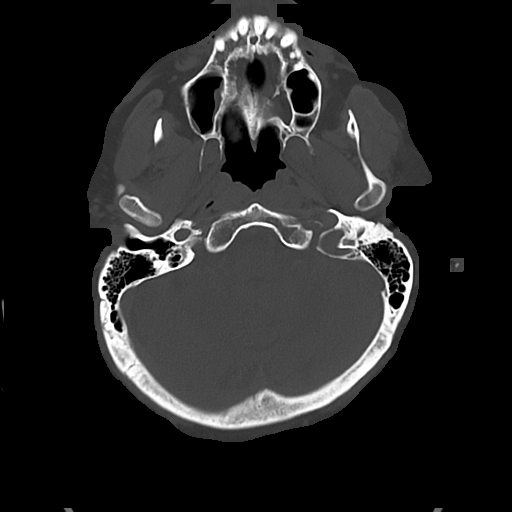
[im 28/92  bone]
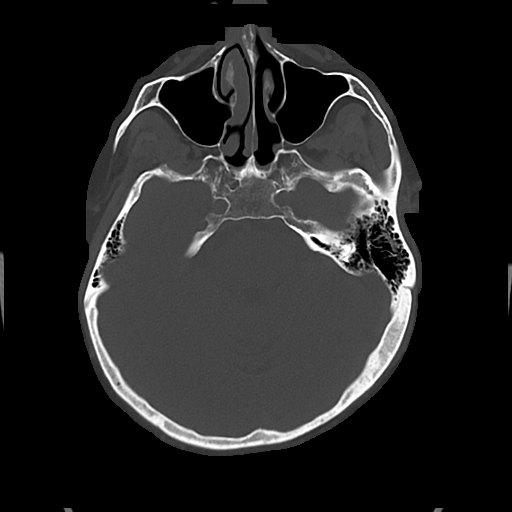
[im 41/92  bone]
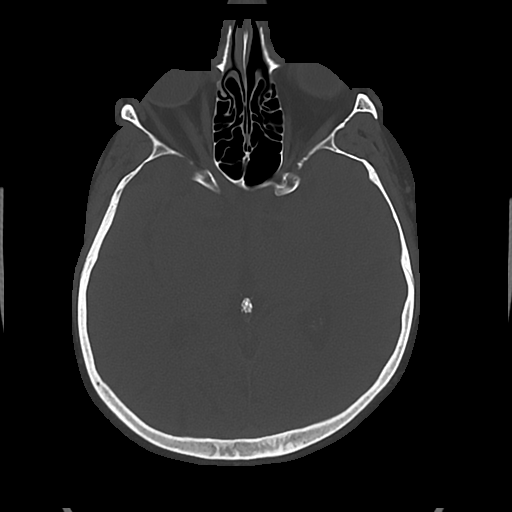

[Series 5: cor soft · coronal · 0.38mm/px · 3 of 72 slices shown]
[im 24/72  brain]
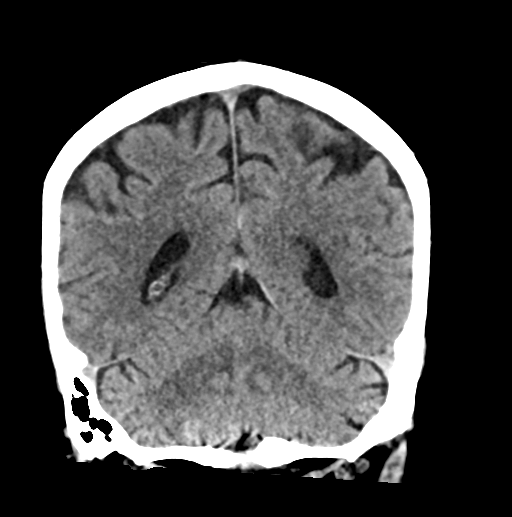
[im 32/72  brain]
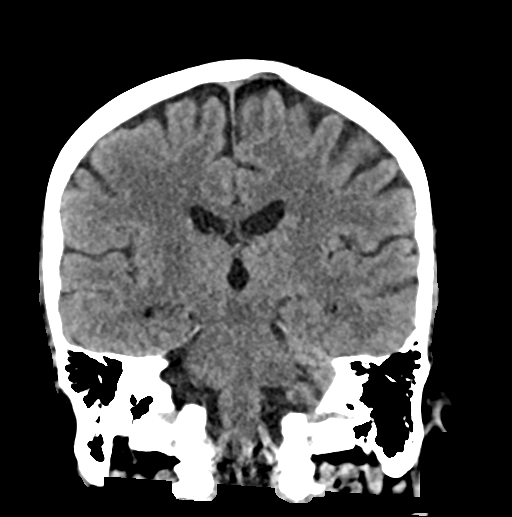
[im 40/72  brain]
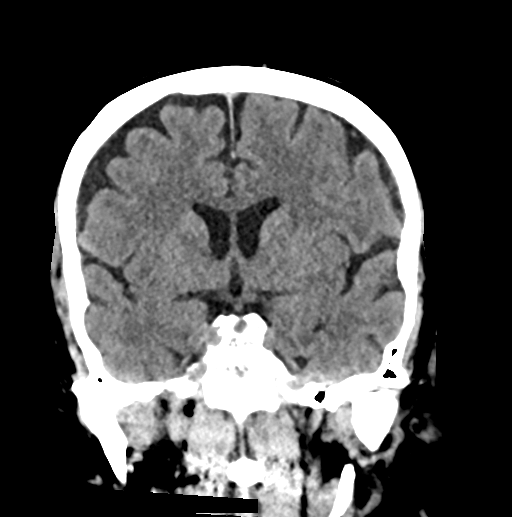

[Series 6: sag soft · sagittal · 0.35mm/px · 3 of 64 slices shown]
[im 22/64  brain]
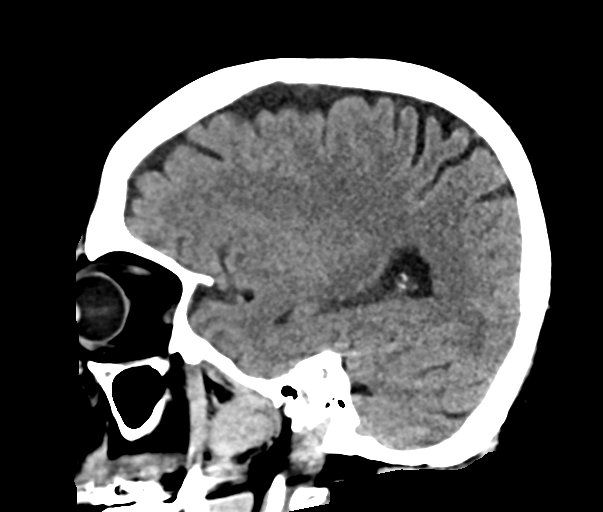
[im 32/64  brain]
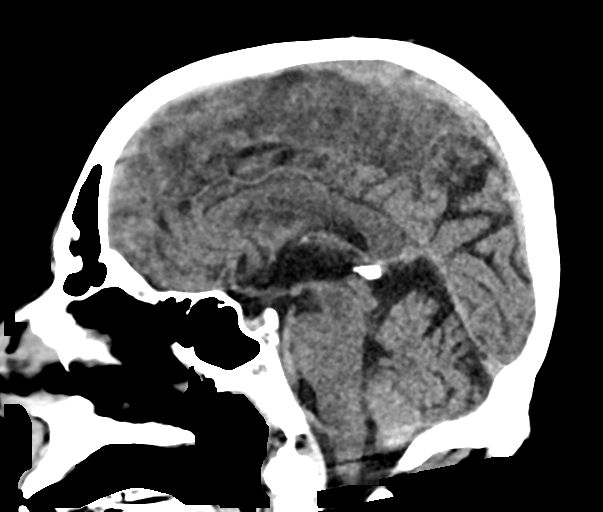
[im 43/64  brain]
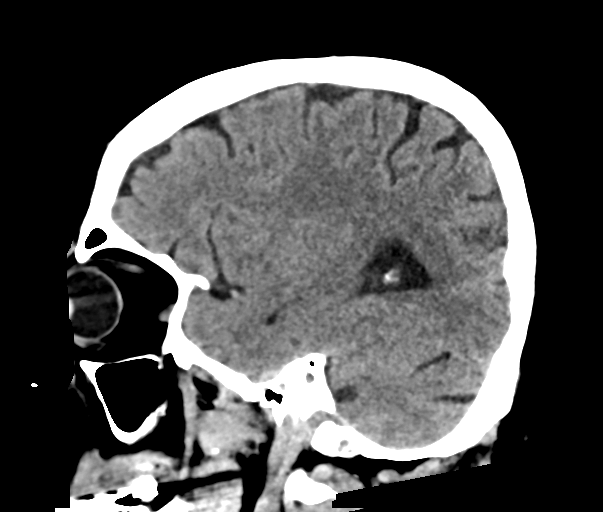

[17 of 47 positions shown; findings below may reference images not displayed]

FINDINGS: Brain: No acute infarct or hemorrhage. Lateral ventricles and
midline structures are stable. No acute extra-axial fluid
collections. No mass effect.

Vascular: No hyperdense vessel or unexpected calcification.

Skull: Normal. Negative for fracture or focal lesion.

Sinuses/Orbits: Minimal mucosal thickening right maxillary sinus.
Remaining sinuses are clear.

Other: None.
IMPRESSION: No acute intracranial process.

## 2022-02-25 ENCOUNTER — Other Ambulatory Visit: Payer: Self-pay | Admitting: Physician Assistant

## 2022-02-25 DIAGNOSIS — R1310 Dysphagia, unspecified: Secondary | ICD-10-CM

## 2022-02-28 ENCOUNTER — Ambulatory Visit
Admission: RE | Admit: 2022-02-28 | Discharge: 2022-02-28 | Disposition: A | Discharge status: Home / Self Care | Payer: BC Managed Care – PPO | Source: Ambulatory Visit | Attending: Physician Assistant | Admitting: Physician Assistant

## 2022-02-28 DIAGNOSIS — R1310 Dysphagia, unspecified: Secondary | ICD-10-CM

## 2022-03-04 ENCOUNTER — Telehealth (HOSPITAL_COMMUNITY): Payer: Self-pay

## 2022-03-04 NOTE — Telephone Encounter (Signed)
Attempted to contact patient to schedule OP MBS - left voicemail. ?

## 2022-03-13 ENCOUNTER — Telehealth (HOSPITAL_COMMUNITY): Payer: Self-pay

## 2022-03-13 NOTE — Telephone Encounter (Signed)
2nd attempt to contact patient to schedule OP MBS - left voicemail. ?

## 2022-03-14 ENCOUNTER — Telehealth: Payer: Self-pay | Admitting: Cardiovascular Disease

## 2022-03-14 NOTE — Telephone Encounter (Signed)
Pt spouse called stating he has been having some numbness in his legs the last 3 weeks. They went to PCP and she recommended they speak to [place].   Pt spouse states that took pt off  vascepa because the pill was too big and he couldn't swallow it. She also wanted [place] to know that some of the other Dr's pt has seen recently increased the irbesartan and pantoprazole

## 2022-03-14 NOTE — Telephone Encounter (Signed)
Spoke to patient's wife she stated husband has been having numbness in both upper legs for the past 2 weeks.No pain.No swelling.PCP advised to see cardiology.Appointment scheduled with Laurann Montana NP 8/3 at 2:45 pm at [facility] location.

## 2022-03-19 ENCOUNTER — Other Ambulatory Visit (HOSPITAL_COMMUNITY): Payer: Self-pay

## 2022-03-19 DIAGNOSIS — R1310 Dysphagia, unspecified: Secondary | ICD-10-CM

## 2022-03-20 ENCOUNTER — Ambulatory Visit (HOSPITAL_BASED_OUTPATIENT_CLINIC_OR_DEPARTMENT_OTHER): Payer: BC Managed Care – PPO | Admitting: Family

## 2022-03-20 ENCOUNTER — Encounter (HOSPITAL_BASED_OUTPATIENT_CLINIC_OR_DEPARTMENT_OTHER): Payer: Self-pay | Admitting: Family

## 2022-03-20 VITALS — BP 134/80 | HR 62 | Ht 68.0 in | Wt 207.5 lb

## 2022-03-20 DIAGNOSIS — E785 Hyperlipidemia, unspecified: Secondary | ICD-10-CM

## 2022-03-20 DIAGNOSIS — I25118 Atherosclerotic heart disease of native coronary artery with other forms of angina pectoris: Secondary | ICD-10-CM

## 2022-03-20 DIAGNOSIS — E118 Type 2 diabetes mellitus with unspecified complications: Secondary | ICD-10-CM

## 2022-03-20 DIAGNOSIS — R200 Anesthesia of skin: Secondary | ICD-10-CM

## 2022-03-20 DIAGNOSIS — Z794 Long term (current) use of insulin: Secondary | ICD-10-CM

## 2022-03-20 NOTE — Patient Instructions (Signed)
Medication Instructions:   No Changes  *If you need a refill on your cardiac medications before your next appointment, please call your pharmacy*   Lab Work:  None  If you have labs (blood work) drawn today and your tests are completely normal, you will receive your results only by: [place] (if you have MyChart) OR A paper copy in the mail If you have any lab test that is abnormal or we need to change your treatment, we will call you to review the results.   Testing/Procedures: Your physician has requested that you have a aorta and iliac duplex. During this test, an ultrasound is used to evaluate blood flow to the aorta and iliac arteries. Allow one hour for this exam. Do not eat after midnight the day before and avoid carbonated beverages.    Your physician has requested that you have a lower extremity arterial duplex. During this test, ultrasound is used to evaluate arterial blood flow in the legs. Allow one hour for this exam. There are no restrictions or special instructions.  Your physician has requested that you have an ankle brachial index (ABI). During this test an ultrasound and blood pressure cuff are used to evaluate the arteries that supply the arms and legs with blood. Allow thirty minutes for this exam. There are no restrictions or special instructions.   Follow-Up: At [facility], you and your health needs are our priority.  As part of our continuing mission to provide you with exceptional heart care, we have created designated Provider Care Teams.  These Care Teams include your primary Cardiologist (physician) and Advanced Practice Providers (APPs -  Physician Assistants and Nurse Practitioners) who all work together to provide you with the care you need, when you need it.  We recommend signing up for the patient portal called "MyChart".  Sign up information is provided on this After Visit Summary.  MyChart is used to connect with patients for Virtual Visits  (Telemedicine).  Patients are able to view lab/test results, encounter notes, upcoming appointments, etc.  Non-urgent messages can be sent to your provider as well.   To learn more about what you can do with MyChart, go to [email or web address].    Your next appointment:   3-4 month(s)  The format for your next appointment:   In Person  Provider:   Shelva Majestic, MD   Other Instructions  Important Information About [place]

## 2022-03-20 NOTE — Progress Notes (Signed)
Office Visit    Patient Name: Richard Hatfield Date of Encounter: 03/20/2022  PCP:  Haywood Pao, [place]  Cardiologist:  Shelva Majestic, MD  Advanced Practice Provider:  No care team member to display Electrophysiologist:  None      Chief Complaint    ALOYSIOUS Hatfield is a 66 y.o. male presents today for numbness in his legs.   Past Medical History    Past Medical History:  Diagnosis Date   Cardiac arrest ([place]) 02/28/2020   Coronary artery disease 02/2020   pt admitted for STEMI in 02/2020 (per pt)   Diabetes mellitus without complication ([place])    Hypertension    Myocardial infarction [facility])    per pt he had a heart attack earlier (02/2020) - see encounters   Past Surgical History:  Procedure Laterality Date   CORONARY ARTERY BYPASS GRAFT N/A 04/06/2020   Procedure: CORONARY ARTERY BYPASS GRAFTING (CABG), ON PUMP, TIMES FOUR, USING BILATERAL INTERNAL MAMMARY ARTERIES AND HARVESTED LEFT RADIAL ARTERY (OPEN);  Surgeon: Wonda Olds, MD;  Location: [place];  Service: Open Heart Surgery;  Laterality: N/A;   LEFT HEART CATH AND CORONARY ANGIOGRAPHY N/A 02/28/2020   Procedure: LEFT HEART CATH AND CORONARY ANGIOGRAPHY;  Surgeon: Troy Sine, MD;  Location: [place] CV LAB;  Service: Cardiovascular;  Laterality: N/A;   RADIAL ARTERY HARVEST Left 04/06/2020   Procedure: RADIAL ARTERY HARVEST;  Surgeon: Wonda Olds, MD;  Location: [place];  Service: Open Heart Surgery;  Laterality: Left;   TEE WITHOUT CARDIOVERSION N/A 04/06/2020   Procedure: TRANSESOPHAGEAL ECHOCARDIOGRAM (TEE);  Surgeon: Wonda Olds, MD;  Location: [place];  Service: Open Heart Surgery;  Laterality: N/A;    Allergies  No Known Allergies  History of Present Illness    Richard Hatfield is a 66 y.o. male with a hx of hypertension, GERD, hyperlipidemia, diabetes mellitus, CAD s/p CABG X4 03/2020, aortic stenosis last seen 11/11/2021 by Dr. Claiborne Billings.  [facility]ed to [facility] after out of hospital cardiac arrest witnessed by wife.  CPR initiated promptly.  Cardiac catheterization revealed significant multivessel CAD with wall motion hypocontractility in anterior lateral wall.  Initial anoxic encephalopathy and following stabilization went to inpatient rehab with significant improvement in return to baseline cognitive function.  Underwent CABG X4 April 06, 2020 by Dr. Orvan Seen with LIMA to LAD, left radial to diagonal and marginal, RIMA-posterior lateral branch of the RCA.  He did have postoperative atrial fibrillation treated with amiodarone which was eventually able to be discontinued.  Repeat echo 04/2021 EF 60-65%, mild aortic stenosis (mean gradient 16 mmHg)  Presents today after wife contacted office noting numbness in bilateral upper legs for two weeks with no pain nor swelling. PCP had advised him to see cardiology per his report. Does note that Vascepa discontinued as pill size too large. Irbesartan and Pantoprazole doses increased by outside provider. When asked about symptoms tells me 2 weeks ago started having numbness in his upper legs most notably when standing. Not bothersome when sitting or ambulating. Noted it about the same time he stopped taking Vascepa - discussed that these are not related.   EKGs/Labs/Other Studies Reviewed:   The following studies were reviewed today:  LHC 02/2020   Prox LAD to Mid LAD lesion is 60% stenosed. 1st Diag lesion is 75% stenosed. Mid LAD lesion is 80% stenosed. Prox Cx lesion is 70% stenosed. Prox Cx to Mid Cx lesion is  80% stenosed. Prox RCA-1 lesion is 20% stenosed. Prox RCA-2 lesion is 70% stenosed. RV Branch lesion is 90% stenosed. Dist RCA-1 lesion is 80% stenosed. Dist RCA-2 lesion is 85% stenosed. RPDA lesion is 30% stenosed. The left ventricular ejection fraction is 45-50% by visual estimate. LV end diastolic pressure is normal. There is mild left ventricular systolic dysfunction.   Out of  hospital witnessed VF cardiac arrest with return of ROSC after approximately 20 minutes of CPR and administration of 2 doses of epinephrine.   Significant three-vessel CAD with 60% diffuse proximal LAD stenosis, long diffuse 70% diagonal stenosis and 80% LAD stenosis after the first diagonal vessel; 70 to 80% proximal diffuse circumflex stenosis before a large marginal branch; and very large dominant RCA with 70% proximal stenosis and long diffuse 80 and 85% stenoses beyond the acute margin proximal to the PDA takeoff with mild 30% narrowing in the PDA.   Mild acute LV dysfunction with focal anterolateral hypocontractility and EF estimated 45 to 50%. LVEDP 12 mm   RECOMMENDATION: Suspect transient coronary vasospasm involving the LAD circulation in the etiology of the patient's VF cardiac arrest. Low-dose IV nitroglycerin was started at the end of the catheterization procedure. Patient will be transported to to heart and evaluated by critical care with consideration for hypothermia due to witnessed cardiac arrest. A 2D echo Doppler study will be obtained. We will review angios with colleagues but with diffuse multivessel CAD in this diabetic male consider possible surgical consultation for CABG revascularization following stability.         EKG: No EKG today  Recent Labs: 04/10/2021: ALT 33; BUN 12; Creatinine, Ser 0.90; Hemoglobin 14.2; Platelets 152; Potassium 5.3; Sodium 141; TSH 5.810  Recent Lipid Panel    Component Value Date/Time   CHOL 158 04/10/2021 0856   TRIG 374 (H) 04/10/2021 0856   HDL 37 (L) 04/10/2021 0856   CHOLHDL 4.3 04/10/2021 0856   CHOLHDL NOT REPORTED DUE TO HIGH TRIGLYCERIDES 02/28/2020 0126   VLDL UNABLE TO CALCULATE IF TRIGLYCERIDE OVER 400 mg/dL 02/28/2020 0126   LDLCALC 63 04/10/2021 0856   LDLDIRECT 61.9 02/28/2020 0126    Home Medications   Current Meds  Medication Sig   acetaminophen (TYLENOL) 325 MG tablet Take 1-2 tablets (325-650 mg total) by mouth  every 4 (four) hours as needed for mild pain.   amLODipine (NORVASC) 5 MG tablet Take 1.5 tablets (7.5 mg total) by mouth daily.   aspirin 81 MG chewable tablet Chew 1 tablet (81 mg total) by mouth daily.   calcium carbonate (TUMS - DOSED IN MG ELEMENTAL CALCIUM) 500 MG chewable tablet Chew 1 tablet (200 mg of elemental calcium total) by mouth 2 (two) times daily. (Patient taking differently: Chew 1 tablet by mouth 3 (three) times daily.)   clopidogrel (PLAVIX) 75 MG tablet Take 1 tablet (75 mg total) by mouth daily.   empagliflozin (JARDIANCE) 25 MG TABS tablet Take 1 tablet (25 mg total) by mouth daily.   EPINEPHrine 0.3 mg/0.3 mL IJ SOAJ injection Inject 1 mL into the skin See admin instructions.   fenofibrate (TRICOR) 145 MG tablet Take 1 tablet by mouth daily.   glucose blood (ONETOUCH VERIO) test strip 1 each by Other route daily.   insulin aspart (NOVOLOG) 100 UNIT/ML injection Inject 12 Units into the skin 3 (three) times daily before meals.   insulin degludec (TRESIBA) 100 UNIT/ML FlexTouch Pen Inject 0.18 mLs (18 Units total) into the skin 2 (two) times daily. (Patient taking differently: Inject  32 Units into the skin 2 (two) times daily.)   Insulin Pen Needle (B-D ULTRAFINE III SHORT PEN) 31G X 8 MM MISC USE 4 TIMES A DAY AS DIRECTED   irbesartan (AVAPRO) 300 MG tablet Take 1 tablet (300 mg total) by mouth daily.   isosorbide dinitrate (ISORDIL) 10 MG tablet Take 1 tablet (10 mg total) by mouth 3 (three) times daily. For one month then stop.   LAGEVRIO 200 MG CAPS capsule SMARTSIG:4 Capsule(s) By Mouth Every 12 Hours   Lancets (ONETOUCH DELICA PLUS [id number]) MISC USE TO SELF MONITOR BLOOD GLUCOSE THREE TIMES DAILY   metoprolol tartrate (LOPRESSOR) 25 MG tablet Take 0.5 tablets (12.5 mg total) by mouth 2 (two) times daily.   Multiple Vitamin (MULTIVITAMIN WITH MINERALS) TABS tablet Take 1 tablet by mouth daily.   pantoprazole (PROTONIX) 40 MG tablet Take 40 mg by mouth 2 (two) times  daily.   polyethylene glycol (MIRALAX / GLYCOLAX) 17 g packet Take 17 g by mouth 2 (two) times daily. (Patient taking differently: Take 17 g by mouth daily as needed for moderate constipation.)   rosuvastatin (CRESTOR) 40 MG tablet Take 1 tablet (40 mg total) by mouth daily.   [DISCONTINUED] Omega-3 Fatty Acids (OMEGA-3 CF) 1000 MG CAPS Take 2 capsules (2,000 mg total) by mouth in the morning and at bedtime.     Review of Systems      All other systems reviewed and are otherwise negative except as noted above.  Physical Exam    VS:  BP 134/80 (BP Location: Right Arm, Patient Position: Sitting, Cuff Size: Normal)   Pulse 62   Ht '5\' 8"'$  (1.727 m)   Wt 207 lb 8 oz (94.1 kg)   BMI 31.55 kg/m  , BMI Body mass index is 31.55 kg/m.  Wt Readings from Last 3 Encounters:  03/20/22 207 lb 8 oz (94.1 kg)  11/11/21 209 lb (94.8 kg)  05/13/21 208 lb 12.8 oz (94.7 kg)    GEN: Well nourished, well developed, in no acute distress. HEENT: normal. Neck: Supple, no JVD, carotid bruits, or masses. Cardiac: RRR, no murmurs, rubs, or gallops. No clubbing, cyanosis, edema.  Radials/PT 2+ and equal bilaterally.  Respiratory:  Respirations regular and unlabored, clear to auscultation bilaterally. GI: Soft, nontender, nondistended. MS: No deformity or atrophy. Skin: Warm and dry, no rash. Neuro:  Strength and sensation are intact. Psych: Normal affect.  Assessment & Plan    Leg numbness - 2 week history of leg numbness in his bilateral thigh most notably with standing. PCP off ice requested he see cardiology. No edema, pain with ambulation. Plan for ABI, aorta/iliac duplex to rule out significant stenosis. Overall low suspicion for PAD but will workup given known CAD. Consider etiology PAD, diabetic neuropathy, nerve injury related to back.   CAD s/p CABG - Stable with no anginal symptoms. No indication for ischemic evaluation.  GDMT aspirin, plavix, jardiance, isordil, metoprolol, rosuvastatin. Heart  healthy diet and regular cardiovascular exercise encouraged.    HTN - BP mildly elevated today. Notes antihypertnesive regimen increased just a few days ago. Will continue same regimen. Discussed to monitor BP at home at least 2 hours after medications and sitting for 5-10 minutes. If remains elevated, consider transition Isordil to Imdur at higher dose.   HLD, LDL goal <70 - Continue Rosuvastatin '40mg'$  QD.  DM2 - Continue to follow with PCP. Suspect his numbness could be element of neuropathy.         Disposition: Follow up in  3 month(s) with Shelva Majestic, MD or APP.  Signed, Loel Dubonnet, NP 03/20/2022, 3:02 PM [place]

## 2022-03-22 ENCOUNTER — Encounter (HOSPITAL_BASED_OUTPATIENT_CLINIC_OR_DEPARTMENT_OTHER): Payer: Self-pay | Admitting: Family

## 2022-03-26 ENCOUNTER — Telehealth: Payer: Self-pay

## 2022-03-26 NOTE — Telephone Encounter (Signed)
   Primary Cardiologist: Shelva Majestic, MD  Chart reviewed as part of pre-operative protocol coverage. Given past medical history and time since last visit, based on ACC/AHA guidelines, Richard Hatfield would be at acceptable risk for the planned procedure without further cardiovascular testing.     Reasonable to hold Plavix for 5 days prior to procedure and resume as soon as hemodynamically stable post-procedure.  I will route this recommendation to the requesting party via Epic fax function and remove from pre-op pool.  Please call with questions.  Emmaline Life, NP-C    03/26/2022, 4:21 PM [place] [address], [address] Office [phone] Fax [phone]

## 2022-03-26 NOTE — Telephone Encounter (Signed)
At clinic visit 03/20/22 patient was able to achieve >4 METS and no anginal symptoms. He had bilateral leg numbness more concerning for etiology of neuropathy or lower back injury. He does have ABIs upcoming 05/09/22 to rule out PAD. I do not think these need to be performed prior to endoscopy.   Loel Dubonnet, NP

## 2022-03-26 NOTE — Telephone Encounter (Signed)
   Pre-operative Risk Assessment    Patient Name: Richard Hatfield  DOB: 01/31/1956 MRN: [id number]      Request for Surgical Clearance    Procedure:   Endoscopy  Date of Surgery:  Clearance 06/04/22                                 Surgeon:  Dr. Therisa Doyne Surgeon's Group or Practice Name:  [facility] Phone number:  [phone] Fax number:  [phone]   Type of Clearance Requested:   - Pharmacy:  Hold Clopidogrel (Plavix) 5   Type of Anesthesia:   Propofol   Additional requests/questions:    Signed, Elsie Lincoln Dayshawn Irizarry   03/26/2022, 1:13 PM

## 2022-03-26 NOTE — Telephone Encounter (Signed)
Richard Hatfield, you saw this patient on 03/20/22 who is now pending endoscopy. Could you please speak to his ability to achieve > 4 METS activity without chest pain or other symptoms concerning for angina.  Additionally, would recommend preop APP call him to make certain he keeps [place] updated since procedure is 2 months out.  Reasonable to hold Plavix for 5 days prior to procedure and resume as soon as hemodynamically stable post-procedure.  Thank you, Emmaline Life, NP-C    03/26/2022, 3:40 PM [place] [address] Office [phone] Fax [phone]

## 2022-03-31 ENCOUNTER — Encounter (HOSPITAL_COMMUNITY): Payer: BC Managed Care – PPO

## 2022-03-31 ENCOUNTER — Encounter (HOSPITAL_COMMUNITY): Payer: Self-pay

## 2022-03-31 ENCOUNTER — Telehealth (HOSPITAL_COMMUNITY): Payer: Self-pay

## 2022-03-31 ENCOUNTER — Ambulatory Visit (HOSPITAL_COMMUNITY)
Admission: RE | Admit: 2022-03-31 | Discharge: 2022-03-31 | Disposition: A | Discharge status: Home / Self Care | Payer: BC Managed Care – PPO | Source: Ambulatory Visit | Attending: Internal Medicine | Admitting: Internal Medicine

## 2022-03-31 DIAGNOSIS — R1310 Dysphagia, unspecified: Secondary | ICD-10-CM

## 2022-03-31 NOTE — Telephone Encounter (Signed)
Called and spoke with patient to reschedule Modified Barium Swallow - patient stated wife will call to reschedule when she returns home from school.

## 2022-04-04 ENCOUNTER — Emergency Department (HOSPITAL_BASED_OUTPATIENT_CLINIC_OR_DEPARTMENT_OTHER)
Admission: EM | Admit: 2022-04-04 | Discharge: 2022-04-05 | Disposition: A | Discharge status: Home / Self Care | Payer: BC Managed Care – PPO | Attending: Emergency Medicine | Admitting: Emergency Medicine

## 2022-04-04 ENCOUNTER — Encounter (HOSPITAL_BASED_OUTPATIENT_CLINIC_OR_DEPARTMENT_OTHER): Payer: Self-pay

## 2022-04-04 ENCOUNTER — Other Ambulatory Visit: Payer: Self-pay

## 2022-04-04 DIAGNOSIS — Z7984 Long term (current) use of oral hypoglycemic drugs: Secondary | ICD-10-CM | POA: Insufficient documentation

## 2022-04-04 DIAGNOSIS — Z7982 Long term (current) use of aspirin: Secondary | ICD-10-CM | POA: Diagnosis not present

## 2022-04-04 DIAGNOSIS — Z955 Presence of coronary angioplasty implant and graft: Secondary | ICD-10-CM | POA: Diagnosis not present

## 2022-04-04 DIAGNOSIS — R410 Disorientation, unspecified: Secondary | ICD-10-CM | POA: Diagnosis present

## 2022-04-04 DIAGNOSIS — Z794 Long term (current) use of insulin: Secondary | ICD-10-CM | POA: Insufficient documentation

## 2022-04-04 DIAGNOSIS — E162 Hypoglycemia, unspecified: Secondary | ICD-10-CM | POA: Insufficient documentation

## 2022-04-04 LAB — COMPREHENSIVE METABOLIC PANEL
ALT: 23 U/L (ref 0–44)
AST: 29 U/L (ref 15–41)
Albumin: 4.5 g/dL (ref 3.5–5.0)
Alkaline Phosphatase: 40 U/L (ref 38–126)
Anion gap: 10 (ref 5–15)
BUN: 12 mg/dL (ref 8–23)
CO2: 23 mmol/L (ref 22–32)
Calcium: 9.6 mg/dL (ref 8.9–10.3)
Chloride: 105 mmol/L (ref 98–111)
Creatinine, Ser: 0.89 mg/dL (ref 0.61–1.24)
GFR, Estimated: >60 mL/min (ref >60)
Glucose, Bld: 113 mg/dL — ABNORMAL HIGH (ref 70–99)
Potassium: 3.8 mmol/L (ref 3.5–5.1)
Sodium: 138 mmol/L (ref 135–145)
Total Bilirubin: 0.5 mg/dL (ref 0.3–1.2)
Total Protein: 7.1 g/dL (ref 6.5–8.1)

## 2022-04-04 LAB — CBC
HCT: 41.2 % (ref 39.0–52.0)
Hemoglobin: 13.8 g/dL (ref 13.0–17.0)
MCH: 30.8 pg (ref 26.0–34.0)
MCHC: 33.5 g/dL (ref 30.0–36.0)
MCV: 92 fL (ref 80.0–100.0)
Platelets: 186 10*3/uL (ref 150–400)
RBC: 4.48 MIL/uL (ref 4.22–5.81)
RDW: 11.9 % (ref 11.5–15.5)
WBC: 7.2 10*3/uL (ref 4.0–10.5)
nRBC: 0 % (ref 0.0–0.2)

## 2022-04-04 LAB — CBG MONITORING, ED: Glucose-Capillary: 118 mg/dL — ABNORMAL HIGH (ref 70–99)

## 2022-04-04 NOTE — ED Triage Notes (Addendum)
Patient here BIB [facility].  States at approximately 1330 he began to have fluctuating Blood Glucose Readings and became altered and had a sensation of Impending Doom. Became Disoriented per Significant Other. EMS was contacted shortly PTA and BG Readings have been as low as 70 and as high as 180. Other VSS with EMS. No Interventions with EMS.   Began New CBG Monitoring System 21 Days ago. No Pain but does Endorse a recent Fall against a Mirror to Left Flank with Associated Bruising.   NAD Noted during Triage. A&Ox4. [facility] BIB Wheelchair/Stretcher.

## 2022-04-05 NOTE — ED Provider Notes (Signed)
[facility] EMERGENCY DEPT  Provider Note  CSN: [id number] Arrival date & time: 04/04/22 1926  History Chief Complaint  Patient presents with   Altered Mental Status    Richard Hatfield is a 66 y.o. 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 He has since fully recovered and gone back to work. He recently began using a dexcom to monitor his blood sugars. At work today he had the device alarm low after lunch. He had taken his usual 26units of Novolog prior to eating but had dipped down below 70 on the monitor. He finished work but called his wife when he got home around 5pm to let her know he was feeling bad again. She was not yet home, so she had him call 911. She arrived a few minutes later and found him confused on the front porch watering som flowers. His dexcom indicates his glucose dropped below 70 at that time as well. It has since stablized and has been ~90 for the last 4 hours while awaiting an exam room. He feels back to baseline now. He has not had dinner and has not had any insulin the last few hours as well.    Home Medications Prior to Admission medications   Medication Sig Start Date End Date Taking? Authorizing Provider  acetaminophen (TYLENOL) 325 MG tablet Take 1-2 tablets (325-650 mg total) by mouth every 4 (four) hours as needed for mild pain. 03/20/20   Love, Ivan Anchors, PA-C  amLODipine (NORVASC) 5 MG tablet Take 1.5 tablets (7.5 mg total) by mouth daily. 11/11/21   Troy Sine, MD  aspirin 81 MG chewable tablet Chew 1 tablet (81 mg total) by mouth daily. 03/09/20   Alma Friendly, MD  calcium carbonate (TUMS - DOSED IN MG ELEMENTAL CALCIUM) 500 MG chewable tablet Chew 1 tablet (200 mg of elemental calcium total) by mouth 2 (two) times daily. Patient taking differently: Chew 1 tablet by mouth 3 (three) times daily. 03/20/20   Love, Ivan Anchors, PA-C  clopidogrel  (PLAVIX) 75 MG tablet Take 1 tablet (75 mg total) by mouth daily. 04/10/20   Nani Skillern, PA-C  empagliflozin (JARDIANCE) 25 MG TABS tablet Take 1 tablet (25 mg total) by mouth daily. 03/20/20   Love, Ivan Anchors, PA-C  EPINEPHrine 0.3 mg/0.3 mL IJ SOAJ injection Inject 1 mL into the skin See admin instructions. 02/24/11   [provider]  fenofibrate (TRICOR) 145 MG tablet Take 1 tablet by mouth daily. 12/09/18   [provider]  glucose blood (ONETOUCH VERIO) test strip 1 each by Other route daily. 04/17/15   [provider]  insulin aspart (NOVOLOG) 100 UNIT/ML injection Inject 12 Units into the skin 3 (three) times daily before meals.    [provider]  insulin degludec (TRESIBA) 100 UNIT/ML FlexTouch Pen Inject 0.18 mLs (18 Units total) into the skin 2 (two) times daily. Patient taking differently: Inject 32 Units into the skin 2 (two) times daily. 03/20/20   Love, Ivan Anchors, PA-C  Insulin Pen Needle (B-D ULTRAFINE III SHORT PEN) 31G X 8 MM MISC USE 4 TIMES A DAY AS DIRECTED 01/20/19   [provider]  irbesartan (AVAPRO) 300 MG tablet Take 1 tablet (300 mg total) by mouth daily. 01/04/21   Deberah Pelton, NP  isosorbide dinitrate (ISORDIL) 10 MG tablet Take 1 tablet (10 mg total) by mouth 3 (three) times daily. For  one month then stop. 04/10/20   Lars Pinks M, PA-C  LAGEVRIO 200 MG CAPS capsule SMARTSIG:4 Capsule(s) By Mouth Every 12 Hours 08/20/21   [provider]  Lancets (ONETOUCH DELICA PLUS [id number]) [place] DAILY    [provider]  metoprolol tartrate (LOPRESSOR) 25 MG tablet Take 0.5 tablets (12.5 mg total) by mouth 2 (two) times daily. 04/11/20   Nani Skillern, PA-C  Multiple Vitamin (MULTIVITAMIN WITH MINERALS) TABS tablet Take 1 tablet by mouth daily. 03/20/20   Love, Ivan Anchors, PA-C  pantoprazole (PROTONIX) 40 MG tablet Take 40 mg by mouth 2 (two) times daily. 02/28/22    [provider]  polyethylene glycol (MIRALAX / GLYCOLAX) 17 g packet Take 17 g by mouth 2 (two) times daily. Patient taking differently: Take 17 g by mouth daily as needed for moderate constipation. 03/20/20   Love, Ivan Anchors, PA-C  rosuvastatin (CRESTOR) 40 MG tablet Take 1 tablet (40 mg total) by mouth daily. 03/20/20   Bary Leriche, PA-C     Allergies    Patient has no known allergies.   Review of Systems   Review of Systems Please see HPI for pertinent positives and negatives  Physical Exam BP 134/84 (BP Location: Right Arm)   Pulse 61   Temp 98.8 F (37.1 C)   Resp 20   Ht '5\' 8"'$  (1.727 m)   Wt 94.1 kg   SpO2 100%   BMI 31.54 kg/m   Physical Exam Vitals and nursing note reviewed.  Constitutional:      Appearance: Normal appearance.  HENT:     Head: Normocephalic and atraumatic.     Nose: Nose normal.     Mouth/Throat:     Mouth: Mucous membranes are moist.  Eyes:     Extraocular Movements: Extraocular movements intact.     Conjunctiva/sclera: Conjunctivae normal.  Cardiovascular:     Rate and Rhythm: Normal rate.  Pulmonary:     Effort: Pulmonary effort is normal.     Breath sounds: Normal breath sounds.  Abdominal:     General: Abdomen is flat.     Palpations: Abdomen is soft.     Tenderness: There is no abdominal tenderness.  Musculoskeletal:        General: No swelling. Normal range of motion.     Cervical back: Neck supple.  Skin:    General: Skin is warm and dry.  Neurological:     General: No focal deficit present.     Mental Status: He is alert.  Psychiatric:        Mood and Affect: Mood normal.     ED Results / Procedures / Treatments   EKG None  Procedures Procedures  Medications Ordered in the ED Medications - No data to display  Initial Impression and Plan  Patient here with two episodes of symptomatic hypoglycemia with readings on Dexcom at just below 70 (on the graph, I cannot see individual readings). He is asymptomatic  now. Lab done in triage show normal CBC and CMP. Glucose has remained stable and he would like to go home. Recommend he decrease his prandial insulin doses to 20units and see how that affects his readings over the weekend. Follow up with his PCP next week for a recheck and further adjustments as necessary.   ED Course       MDM Rules/Calculators/A&P Medical Decision Making Given presenting complaint, I considered that admission might be necessary. After review of  results from ED lab and/or imaging studies, admission to the hospital is not indicated at this time.    Problems Addressed: Hypoglycemia: acute illness or injury  Amount and/or Complexity of Data Reviewed Labs: ordered. Decision-making details documented in ED Course.  Risk Prescription drug management. Decision regarding hospitalization.    Final Clinical Impression(s) / ED Diagnoses Final diagnoses:  Hypoglycemia    Rx / DC Orders ED Discharge Orders     None        Truddie Hidden, MD 04/05/22 [phone]

## 2022-04-05 NOTE — Discharge Instructions (Addendum)
Please decrease your Novolog to 20 units with meals and continue to monitor your glucose with your Dexcom device. Please return to the ED if you continue to have low readings or for any other concerns.

## 2022-04-05 NOTE — ED Notes (Signed)
Reviewed AVS/discharge instruction with patient. Time allotted for and all questions answered. Patient is agreeable for d/c and escorted to ed exit by staff.  

## 2022-04-07 ENCOUNTER — Ambulatory Visit (HOSPITAL_COMMUNITY): Admission: RE | Admit: 2022-04-07 | Payer: BC Managed Care – PPO | Source: Ambulatory Visit

## 2022-04-07 ENCOUNTER — Encounter (HOSPITAL_COMMUNITY): Payer: Self-pay

## 2022-04-07 ENCOUNTER — Ambulatory Visit (HOSPITAL_COMMUNITY): Payer: BC Managed Care – PPO

## 2022-04-10 ENCOUNTER — Encounter (HOSPITAL_COMMUNITY): Payer: Self-pay

## 2022-04-10 ENCOUNTER — Emergency Department (HOSPITAL_COMMUNITY): Payer: BC Managed Care – PPO

## 2022-04-10 ENCOUNTER — Other Ambulatory Visit: Payer: Self-pay

## 2022-04-10 ENCOUNTER — Emergency Department (HOSPITAL_COMMUNITY)
Admission: EM | Admit: 2022-04-10 | Discharge: 2022-04-10 | Disposition: A | Discharge status: Home / Self Care | Payer: BC Managed Care – PPO | Attending: Emergency Medicine | Admitting: Emergency Medicine

## 2022-04-10 DIAGNOSIS — I10 Essential (primary) hypertension: Secondary | ICD-10-CM | POA: Insufficient documentation

## 2022-04-10 DIAGNOSIS — R079 Chest pain, unspecified: Secondary | ICD-10-CM

## 2022-04-10 DIAGNOSIS — Z7984 Long term (current) use of oral hypoglycemic drugs: Secondary | ICD-10-CM | POA: Diagnosis not present

## 2022-04-10 DIAGNOSIS — Z7982 Long term (current) use of aspirin: Secondary | ICD-10-CM | POA: Diagnosis not present

## 2022-04-10 DIAGNOSIS — R112 Nausea with vomiting, unspecified: Secondary | ICD-10-CM | POA: Diagnosis not present

## 2022-04-10 DIAGNOSIS — Z794 Long term (current) use of insulin: Secondary | ICD-10-CM | POA: Diagnosis not present

## 2022-04-10 DIAGNOSIS — Z951 Presence of aortocoronary bypass graft: Secondary | ICD-10-CM | POA: Insufficient documentation

## 2022-04-10 DIAGNOSIS — R0789 Other chest pain: Secondary | ICD-10-CM | POA: Insufficient documentation

## 2022-04-10 DIAGNOSIS — R0602 Shortness of breath: Secondary | ICD-10-CM | POA: Insufficient documentation

## 2022-04-10 DIAGNOSIS — I2510 Atherosclerotic heart disease of native coronary artery without angina pectoris: Secondary | ICD-10-CM | POA: Diagnosis not present

## 2022-04-10 DIAGNOSIS — Z79899 Other long term (current) drug therapy: Secondary | ICD-10-CM | POA: Diagnosis not present

## 2022-04-10 DIAGNOSIS — E119 Type 2 diabetes mellitus without complications: Secondary | ICD-10-CM | POA: Diagnosis not present

## 2022-04-10 DIAGNOSIS — R464 Slowness and poor responsiveness: Secondary | ICD-10-CM

## 2022-04-10 LAB — TROPONIN I (HIGH SENSITIVITY)
Troponin I (High Sensitivity): 10 ng/L (ref <18)
Troponin I (High Sensitivity): 12 ng/L (ref <18)

## 2022-04-10 LAB — URINALYSIS, ROUTINE W REFLEX MICROSCOPIC
Bilirubin Urine: NEGATIVE
Glucose, UA: >=500 mg/dL — AB
Hgb urine dipstick: NEGATIVE
Ketones, ur: NEGATIVE mg/dL
Leukocytes,Ua: NEGATIVE
Nitrite: NEGATIVE
Protein, ur: NEGATIVE mg/dL
Specific Gravity, Urine: 1.01 (ref 1.005–1.030)
pH: 6 (ref 5.0–8.0)

## 2022-04-10 LAB — CBG MONITORING, ED: Glucose-Capillary: 113 mg/dL — ABNORMAL HIGH (ref 70–99)

## 2022-04-10 LAB — COMPREHENSIVE METABOLIC PANEL
ALT: 31 U/L (ref 0–44)
AST: 40 U/L (ref 15–41)
Albumin: 3.9 g/dL (ref 3.5–5.0)
Alkaline Phosphatase: 36 U/L — ABNORMAL LOW (ref 38–126)
Anion gap: 7 (ref 5–15)
BUN: 9 mg/dL (ref 8–23)
CO2: 22 mmol/L (ref 22–32)
Calcium: 9.4 mg/dL (ref 8.9–10.3)
Chloride: 112 mmol/L — ABNORMAL HIGH (ref 98–111)
Creatinine, Ser: 0.9 mg/dL (ref 0.61–1.24)
GFR, Estimated: >60 mL/min (ref >60)
Glucose, Bld: 111 mg/dL — ABNORMAL HIGH (ref 70–99)
Potassium: 4 mmol/L (ref 3.5–5.1)
Sodium: 141 mmol/L (ref 135–145)
Total Bilirubin: 0.7 mg/dL (ref 0.3–1.2)
Total Protein: 6.4 g/dL — ABNORMAL LOW (ref 6.5–8.1)

## 2022-04-10 LAB — CBC WITH DIFFERENTIAL/PLATELET
Abs Immature Granulocytes: 0.01 10*3/uL (ref 0.00–0.07)
Basophils Absolute: 0.1 10*3/uL (ref 0.0–0.1)
Basophils Relative: 1 %
Eosinophils Absolute: 0.1 10*3/uL (ref 0.0–0.5)
Eosinophils Relative: 1 %
HCT: 40.3 % (ref 39.0–52.0)
Hemoglobin: 13.4 g/dL (ref 13.0–17.0)
Immature Granulocytes: 0 %
Lymphocytes Relative: 21 %
Lymphs Abs: 1.3 10*3/uL (ref 0.7–4.0)
MCH: 30.7 pg (ref 26.0–34.0)
MCHC: 33.3 g/dL (ref 30.0–36.0)
MCV: 92.4 fL (ref 80.0–100.0)
Monocytes Absolute: 0.6 10*3/uL (ref 0.1–1.0)
Monocytes Relative: 10 %
Neutro Abs: 4.3 10*3/uL (ref 1.7–7.7)
Neutrophils Relative %: 67 %
Platelets: 161 10*3/uL (ref 150–400)
RBC: 4.36 MIL/uL (ref 4.22–5.81)
RDW: 11.7 % (ref 11.5–15.5)
WBC: 6.4 10*3/uL (ref 4.0–10.5)
nRBC: 0 % (ref 0.0–0.2)

## 2022-04-10 LAB — ETHANOL: Alcohol, Ethyl (B): <10 mg/dL (ref <10)

## 2022-04-10 LAB — T4, FREE: Free T4: 0.79 ng/dL (ref 0.61–1.12)

## 2022-04-10 LAB — ACETAMINOPHEN LEVEL: Acetaminophen (Tylenol), Serum: <10 ug/mL — ABNORMAL LOW (ref 10–30)

## 2022-04-10 LAB — SALICYLATE LEVEL: Salicylate Lvl: <7 mg/dL — ABNORMAL LOW (ref 7.0–30.0)

## 2022-04-10 LAB — TSH: TSH: 1.637 u[IU]/mL (ref 0.350–4.500)

## 2022-04-10 MED ORDER — LORAZEPAM 1 MG PO TABS
1.0000 mg | ORAL_TABLET | Freq: Once | ORAL | Status: AC | PRN
  Administered 2022-04-10: 1 mg via ORAL
  Filled 2022-04-10: qty 1

## 2022-04-10 NOTE — Discharge Instructions (Addendum)
Thank you for coming to the [facility] Emergency Department.  You were seen today for chest tightness and slowness or poor responsiveness.  You had a full evaluation including cardiac work-up as well as an MRI brain which were normal.  Please follow-up with your primary care doctor within the week and make an appointment with your neurologist via the referral you already have.  If you have any further "slowness," episodes of chest tightness, worsening symptoms, lightheadedness or dizziness, or have any other questions or concerns, please do not hesitate to come back to the emergency department.

## 2022-04-10 NOTE — ED Notes (Signed)
Discharge instructions and follow up care were discussed with Pt and Pt verbalized understanding and stated no questions, comments, or concerns. Pt left with family and in a wheelchair.

## 2022-04-10 NOTE — ED Triage Notes (Signed)
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 Pt states it is on the left side of his chest and feels like pressure but has eased up some. PT denies any radiations. Per wife she felt like pt was having issues with speaking. Pt's LKW was 3159.

## 2022-04-10 NOTE — ED Provider Notes (Signed)
[facility] EMERGENCY DEPARTMENT Provider Note   CSN: [id number] Arrival date & time: 04/10/22  1444     History  Chief Complaint  Patient presents with   Chest Pain    Richard Hatfield is a 66 y.o. male.  Patient presents to the hospital via EMS with a chief complaint of chest pain.  Patient was having chest pain or tightness at work and went home early.  Patient initially had pain on the left side of his chest that felt like pressure that was rated at a 7 out of 10 in severity.  At this time the patient feels that it has let up significantly.  He denies any radiation of pain.  When EMS arrived the patient's wife stated she felt like the patient was having difficulty with speaking.  His last known well was 1245 today.  Upon arrival the patient is alert and oriented but sluggish with responses.  When asked what month it is the patient hesitates, says July, thinks about it for a little longer and says "I think it is August, close to September."  His chief complaint at this time is a feeling of constipation.  He denies headache, vision issues, chest pain other than mild pressure on the left side, shortness of breath, abdominal pain, nausea, vomiting, urinary symptoms.  Past medical history significant for cardiac arrest in 2021 with ROSC, subsequent CABG and rehab stay for anoxic brain injury, diabetes mellitus without complication, coronary artery disease, hypertension  HPI     Home Medications Prior to Admission medications   Medication Sig Start Date End Date Taking? Authorizing Provider  acetaminophen (TYLENOL) 325 MG tablet Take 1-2 tablets (325-650 mg total) by mouth every 4 (four) hours as needed for mild pain. 03/20/20   Love, Ivan Anchors, PA-C  amLODipine (NORVASC) 5 MG tablet Take 1.5 tablets (7.5 mg total) by mouth daily. 11/11/21   Troy Sine, MD  aspirin 81 MG chewable tablet Chew 1 tablet (81 mg total) by mouth daily. 03/09/20   Alma Friendly, MD  calcium  carbonate (TUMS - DOSED IN MG ELEMENTAL CALCIUM) 500 MG chewable tablet Chew 1 tablet (200 mg of elemental calcium total) by mouth 2 (two) times daily. Patient taking differently: Chew 1 tablet by mouth 3 (three) times daily. 03/20/20   Love, Ivan Anchors, PA-C  clopidogrel (PLAVIX) 75 MG tablet Take 1 tablet (75 mg total) by mouth daily. 04/10/20   Nani Skillern, PA-C  empagliflozin (JARDIANCE) 25 MG TABS tablet Take 1 tablet (25 mg total) by mouth daily. 03/20/20   Love, Ivan Anchors, PA-C  EPINEPHrine 0.3 mg/0.3 mL IJ SOAJ injection Inject 1 mL into the skin See admin instructions. 02/24/11   [provider]  fenofibrate (TRICOR) 145 MG tablet Take 1 tablet by mouth daily. 12/09/18   [provider]  glucose blood (ONETOUCH VERIO) test strip 1 each by Other route daily. 04/17/15   [provider]  insulin aspart (NOVOLOG) 100 UNIT/ML injection Inject 12 Units into the skin 3 (three) times daily before meals.    [provider]  insulin degludec (TRESIBA) 100 UNIT/ML FlexTouch Pen Inject 0.18 mLs (18 Units total) into the skin 2 (two) times daily. Patient taking differently: Inject 32 Units into the skin 2 (two) times daily. 03/20/20   Love, Ivan Anchors, PA-C  Insulin Pen Needle (B-D ULTRAFINE III SHORT PEN) 31G X 8 MM MISC USE 4 TIMES A DAY AS DIRECTED 01/20/19   [provider]  irbesartan (AVAPRO) 300 MG tablet Take 1 tablet (300 mg total) by mouth daily. 01/04/21   Deberah Pelton, NP  isosorbide dinitrate (ISORDIL) 10 MG tablet Take 1 tablet (10 mg total) by mouth 3 (three) times daily. For one month then stop. 04/10/20   Lars Pinks M, PA-C  LAGEVRIO 200 MG CAPS capsule SMARTSIG:4 Capsule(s) By Mouth Every 12 Hours 08/20/21   [provider]  Lancets (ONETOUCH DELICA PLUS [id number]) [place] DAILY    [provider]  metoprolol tartrate (LOPRESSOR) 25 MG tablet Take 0.5 tablets (12.5 mg total) by  mouth 2 (two) times daily. 04/11/20   Nani Skillern, PA-C  Multiple Vitamin (MULTIVITAMIN WITH MINERALS) TABS tablet Take 1 tablet by mouth daily. 03/20/20   Love, Ivan Anchors, PA-C  pantoprazole (PROTONIX) 40 MG tablet Take 40 mg by mouth 2 (two) times daily. 02/28/22   [provider]  polyethylene glycol (MIRALAX / GLYCOLAX) 17 g packet Take 17 g by mouth 2 (two) times daily. Patient taking differently: Take 17 g by mouth daily as needed for moderate constipation. 03/20/20   Love, Ivan Anchors, PA-C  rosuvastatin (CRESTOR) 40 MG tablet Take 1 tablet (40 mg total) by mouth daily. 03/20/20   Love, Ivan Anchors, PA-C      Allergies    Bee venom    Review of Systems   Review of Systems  Constitutional:  Negative for fever.  Respiratory:  Negative for shortness of breath.   Cardiovascular:  Positive for chest pain.  Gastrointestinal:  Positive for constipation. Negative for abdominal pain, diarrhea, nausea and vomiting.  Genitourinary:  Negative for dysuria.  Neurological:  Positive for speech difficulty (Sluggish speech). Negative for dizziness, syncope, facial asymmetry, light-headedness and headaches.    Physical Exam Updated Vital Signs BP (!) 151/78 (BP Location: Right Arm)   Pulse 60   Temp 98.1 F (36.7 C) (Oral)   Resp 15   Ht '5\' 9"'$  (1.753 m)   Wt 90.7 kg   SpO2 100%   BMI 29.53 kg/m  Physical Exam Vitals and nursing note reviewed.  Constitutional:      General: He is not in acute distress.    Appearance: He is normal weight.  HENT:     Head: Normocephalic and atraumatic.  Eyes:     Extraocular Movements: Extraocular movements intact.     Pupils: Pupils are equal, round, and reactive to light.  Cardiovascular:     Rate and Rhythm: Normal rate and regular rhythm.     Heart sounds: Normal heart sounds.  Pulmonary:     Effort: Pulmonary effort is normal.     Breath sounds: Normal breath sounds.  Chest:     Chest wall: No tenderness.  Abdominal:     Palpations:  Abdomen is soft.     Tenderness: There is no abdominal tenderness.  Musculoskeletal:        General: Normal range of motion.     Cervical back: Normal range of motion and neck supple.     Right lower leg: No edema.     Left lower leg: No edema.  Skin:    General: Skin is warm and dry.     Capillary Refill: Capillary refill takes less than 2 seconds.  Neurological:     Mental Status: He is alert.     ED Results / Procedures / Treatments   Labs (all labs ordered are listed, but only abnormal results are displayed) Labs  Reviewed  CBC WITH DIFFERENTIAL/PLATELET  COMPREHENSIVE METABOLIC PANEL  URINALYSIS, ROUTINE W REFLEX MICROSCOPIC  LACTIC ACID, PLASMA  ETHANOL  ACETAMINOPHEN LEVEL  RAPID URINE DRUG SCREEN, HOSP PERFORMED  SALICYLATE LEVEL  CBG MONITORING, ED  TROPONIN I (HIGH SENSITIVITY)    EKG EKG Interpretation  Date/Time:  Thursday April 10 2022 14:50:15 EDT Ventricular Rate:  62 PR Interval:  152 QRS Duration: 96 QT Interval:  421 QTC Calculation: 428 R Axis:   82 Text Interpretation: Sinus rhythm Probable left atrial enlargement Borderline right axis deviation Nonspecific T abnrm, anterolateral leads No significant change since last tracing Confirmed by Isla Pence [phone]) on 04/10/2022 2:54:56 PM  Radiology No results found.  Procedures Procedures    Medications Ordered in ED Medications - No data to display  ED Course/ Medical Decision Making/ A&P                           Medical Decision Making Amount and/or Complexity of Data Reviewed Labs: ordered. Radiology: ordered.   This patient presents to the ED for concern of altered mental status, this involves an extensive number of treatment options, and is a complaint that carries with it a high risk of complications and morbidity.  The differential diagnosis includes intracranial abnormality, CVA, TIA, metabolic disorder, and others   Co morbidities that complicate the patient  evaluation  History of anoxic brain injury   Additional history obtained:  Additional history obtained from EMS and patient's wife External records from outside source obtained and reviewed including notes documenting visit to cardiology for leg numbness, hypertension, coronary artery disease status post CABG, DM2, hyperlipidemia   Lab Tests:  I Ordered, and personally interpreted labs.  The pertinent results include:  Grossly normal CMP, normal CBC, initial troponin 10, negative salicylate, negative acetaminophen, negative ethanol, urinalysis with greater than 500 glucose but otherwise nonremarkable, second troponin and thyroid studies pending   Imaging Studies ordered:  I ordered imaging studies including chest x-ray, CT without contrast, and MR brain without contrast I independently visualized and interpreted imaging which showed no acute cardiopulmonary disease, no acute intracranial abnormality on CT.  MRI pending I agree with the radiologist interpretation   Cardiac Monitoring: / EKG:  The patient was maintained on a cardiac monitor.  I personally viewed and interpreted the cardiac monitored which showed an underlying rhythm of: Sinus   Test / Admission - Considered:  Patient presents with sluggish speech and thought process.  Unclear etiology at this time.  Lab work-up to this point is unremarkable.  CT head was unremarkable.  MRI brain is pending. Disposition pending MR results and thyroid study. May need neuro consult. Patient care being transferred to Dr.Naasz at shift handoff.         Final Clinical Impression(s) / ED Diagnoses Final diagnoses:  None    Rx / DC Orders ED Discharge Orders     None         Ronny Bacon 04/10/22 1901    Audley Hose, MD 04/10/22 2244    Audley Hose, MD 04/10/22 2245

## 2022-04-16 ENCOUNTER — Encounter (HOSPITAL_COMMUNITY): Payer: Self-pay

## 2022-04-16 ENCOUNTER — Ambulatory Visit (HOSPITAL_COMMUNITY): Admission: RE | Admit: 2022-04-16 | Payer: BC Managed Care – PPO | Source: Ambulatory Visit

## 2022-04-16 ENCOUNTER — Other Ambulatory Visit: Payer: Self-pay

## 2022-04-16 ENCOUNTER — Emergency Department (HOSPITAL_COMMUNITY)
Admission: EM | Admit: 2022-04-16 | Discharge: 2022-04-16 | Disposition: A | Discharge status: Home / Self Care | Payer: BC Managed Care – PPO | Attending: Student | Admitting: Student

## 2022-04-16 ENCOUNTER — Emergency Department (HOSPITAL_COMMUNITY): Payer: BC Managed Care – PPO

## 2022-04-16 DIAGNOSIS — K5900 Constipation, unspecified: Secondary | ICD-10-CM | POA: Insufficient documentation

## 2022-04-16 DIAGNOSIS — Z79899 Other long term (current) drug therapy: Secondary | ICD-10-CM | POA: Diagnosis not present

## 2022-04-16 DIAGNOSIS — R011 Cardiac murmur, unspecified: Secondary | ICD-10-CM | POA: Diagnosis not present

## 2022-04-16 DIAGNOSIS — E109 Type 1 diabetes mellitus without complications: Secondary | ICD-10-CM | POA: Insufficient documentation

## 2022-04-16 DIAGNOSIS — Z7984 Long term (current) use of oral hypoglycemic drugs: Secondary | ICD-10-CM | POA: Insufficient documentation

## 2022-04-16 DIAGNOSIS — N182 Chronic kidney disease, stage 2 (mild): Secondary | ICD-10-CM | POA: Diagnosis not present

## 2022-04-16 DIAGNOSIS — I2510 Atherosclerotic heart disease of native coronary artery without angina pectoris: Secondary | ICD-10-CM | POA: Diagnosis not present

## 2022-04-16 DIAGNOSIS — I129 Hypertensive chronic kidney disease with stage 1 through stage 4 chronic kidney disease, or unspecified chronic kidney disease: Secondary | ICD-10-CM | POA: Insufficient documentation

## 2022-04-16 DIAGNOSIS — Z794 Long term (current) use of insulin: Secondary | ICD-10-CM | POA: Insufficient documentation

## 2022-04-16 DIAGNOSIS — Z7982 Long term (current) use of aspirin: Secondary | ICD-10-CM | POA: Insufficient documentation

## 2022-04-16 DIAGNOSIS — Z7902 Long term (current) use of antithrombotics/antiplatelets: Secondary | ICD-10-CM | POA: Diagnosis not present

## 2022-04-16 LAB — CBG MONITORING, ED: Glucose-Capillary: 88 mg/dL (ref 70–99)

## 2022-04-16 LAB — URINALYSIS, ROUTINE W REFLEX MICROSCOPIC
Bacteria, UA: NONE SEEN
Bilirubin Urine: NEGATIVE
Glucose, UA: >=500 mg/dL — AB
Hgb urine dipstick: NEGATIVE
Ketones, ur: NEGATIVE mg/dL
Leukocytes,Ua: NEGATIVE
Nitrite: NEGATIVE
Protein, ur: NEGATIVE mg/dL
Specific Gravity, Urine: 1.009 (ref 1.005–1.030)
pH: 6 (ref 5.0–8.0)

## 2022-04-16 LAB — COMPREHENSIVE METABOLIC PANEL
ALT: 35 U/L (ref 0–44)
AST: 33 U/L (ref 15–41)
Albumin: 4 g/dL (ref 3.5–5.0)
Alkaline Phosphatase: 44 U/L (ref 38–126)
Anion gap: 9 (ref 5–15)
BUN: 8 mg/dL (ref 8–23)
CO2: 23 mmol/L (ref 22–32)
Calcium: 9.9 mg/dL (ref 8.9–10.3)
Chloride: 110 mmol/L (ref 98–111)
Creatinine, Ser: 0.97 mg/dL (ref 0.61–1.24)
GFR, Estimated: >60 mL/min (ref >60)
Glucose, Bld: 81 mg/dL (ref 70–99)
Potassium: 4 mmol/L (ref 3.5–5.1)
Sodium: 142 mmol/L (ref 135–145)
Total Bilirubin: 0.6 mg/dL (ref 0.3–1.2)
Total Protein: 6.6 g/dL (ref 6.5–8.1)

## 2022-04-16 LAB — CBC WITH DIFFERENTIAL/PLATELET
Abs Immature Granulocytes: 0.01 K/uL (ref 0.00–0.07)
Basophils Absolute: 0.1 K/uL (ref 0.0–0.1)
Basophils Relative: 1 %
Eosinophils Absolute: 0.1 K/uL (ref 0.0–0.5)
Eosinophils Relative: 1 %
HCT: 43.1 % (ref 39.0–52.0)
Hemoglobin: 14.2 g/dL (ref 13.0–17.0)
Immature Granulocytes: 0 %
Lymphocytes Relative: 17 %
Lymphs Abs: 1.3 K/uL (ref 0.7–4.0)
MCH: 30.7 pg (ref 26.0–34.0)
MCHC: 32.9 g/dL (ref 30.0–36.0)
MCV: 93.3 fL (ref 80.0–100.0)
Monocytes Absolute: 0.9 K/uL (ref 0.1–1.0)
Monocytes Relative: 11 %
Neutro Abs: 5.6 K/uL (ref 1.7–7.7)
Neutrophils Relative %: 70 %
Platelets: 173 K/uL (ref 150–400)
RBC: 4.62 MIL/uL (ref 4.22–5.81)
RDW: 11.9 % (ref 11.5–15.5)
WBC: 8 K/uL (ref 4.0–10.5)
nRBC: 0 % (ref 0.0–0.2)

## 2022-04-16 LAB — LIPASE, BLOOD: Lipase: 57 U/L — ABNORMAL HIGH (ref 11–51)

## 2022-04-16 MED ORDER — IOHEXOL 350 MG/ML SOLN
100.0000 mL | Freq: Once | INTRAVENOUS | Status: AC | PRN
  Administered 2022-04-16: 100 mL via INTRAVENOUS
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
## 2022-04-16 NOTE — Discharge Instructions (Signed)
Please continue to taking miralax and dulcolax as previously prescribed.  Drink plenty of fluid and follow up with your doctor for further care.
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
## 2022-04-28 ENCOUNTER — Emergency Department (HOSPITAL_BASED_OUTPATIENT_CLINIC_OR_DEPARTMENT_OTHER): Payer: BC Managed Care – PPO

## 2022-04-28 ENCOUNTER — Encounter (HOSPITAL_BASED_OUTPATIENT_CLINIC_OR_DEPARTMENT_OTHER): Payer: Self-pay | Admitting: Radiology

## 2022-04-28 ENCOUNTER — Emergency Department (HOSPITAL_BASED_OUTPATIENT_CLINIC_OR_DEPARTMENT_OTHER)
Admission: EM | Admit: 2022-04-28 | Discharge: 2022-04-28 | Disposition: A | Discharge status: Home / Self Care | Payer: BC Managed Care – PPO | Attending: Emergency Medicine | Admitting: Emergency Medicine

## 2022-04-28 ENCOUNTER — Other Ambulatory Visit: Payer: Self-pay

## 2022-04-28 DIAGNOSIS — Z7902 Long term (current) use of antithrombotics/antiplatelets: Secondary | ICD-10-CM | POA: Diagnosis not present

## 2022-04-28 DIAGNOSIS — Z7982 Long term (current) use of aspirin: Secondary | ICD-10-CM | POA: Insufficient documentation

## 2022-04-28 DIAGNOSIS — Z79899 Other long term (current) drug therapy: Secondary | ICD-10-CM | POA: Diagnosis not present

## 2022-04-28 DIAGNOSIS — Z951 Presence of aortocoronary bypass graft: Secondary | ICD-10-CM | POA: Insufficient documentation

## 2022-04-28 DIAGNOSIS — I10 Essential (primary) hypertension: Secondary | ICD-10-CM | POA: Diagnosis not present

## 2022-04-28 DIAGNOSIS — E1165 Type 2 diabetes mellitus with hyperglycemia: Secondary | ICD-10-CM | POA: Diagnosis not present

## 2022-04-28 DIAGNOSIS — Z794 Long term (current) use of insulin: Secondary | ICD-10-CM | POA: Diagnosis not present

## 2022-04-28 DIAGNOSIS — I2510 Atherosclerotic heart disease of native coronary artery without angina pectoris: Secondary | ICD-10-CM | POA: Diagnosis not present

## 2022-04-28 DIAGNOSIS — K5900 Constipation, unspecified: Secondary | ICD-10-CM | POA: Diagnosis present

## 2022-04-28 DIAGNOSIS — M7981 Nontraumatic hematoma of soft tissue: Secondary | ICD-10-CM | POA: Insufficient documentation

## 2022-04-28 DIAGNOSIS — R748 Abnormal levels of other serum enzymes: Secondary | ICD-10-CM | POA: Insufficient documentation

## 2022-04-28 DIAGNOSIS — I951 Orthostatic hypotension: Secondary | ICD-10-CM

## 2022-04-28 LAB — CBG MONITORING, ED: Glucose-Capillary: 114 mg/dL — ABNORMAL HIGH (ref 70–99)

## 2022-04-28 LAB — URINALYSIS, ROUTINE W REFLEX MICROSCOPIC
Bilirubin Urine: NEGATIVE
Glucose, UA: >1000 mg/dL — AB
Hgb urine dipstick: NEGATIVE
Leukocytes,Ua: NEGATIVE
Nitrite: NEGATIVE
Protein, ur: NEGATIVE mg/dL
Specific Gravity, Urine: >1.046 — ABNORMAL HIGH (ref 1.005–1.030)
pH: 6.5 (ref 5.0–8.0)

## 2022-04-28 LAB — CBC WITH DIFFERENTIAL/PLATELET
Abs Immature Granulocytes: 0.01 10*3/uL (ref 0.00–0.07)
Basophils Absolute: 0.1 10*3/uL (ref 0.0–0.1)
Basophils Relative: 1 %
Eosinophils Absolute: 0 10*3/uL (ref 0.0–0.5)
Eosinophils Relative: 1 %
HCT: 43.8 % (ref 39.0–52.0)
Hemoglobin: 14.8 g/dL (ref 13.0–17.0)
Immature Granulocytes: 0 %
Lymphocytes Relative: 17 %
Lymphs Abs: 1 10*3/uL (ref 0.7–4.0)
MCH: 30.8 pg (ref 26.0–34.0)
MCHC: 33.8 g/dL (ref 30.0–36.0)
MCV: 91.3 fL (ref 80.0–100.0)
Monocytes Absolute: 0.5 10*3/uL (ref 0.1–1.0)
Monocytes Relative: 8 %
Neutro Abs: 4.4 10*3/uL (ref 1.7–7.7)
Neutrophils Relative %: 73 %
Platelets: 158 10*3/uL (ref 150–400)
RBC: 4.8 MIL/uL (ref 4.22–5.81)
RDW: 12 % (ref 11.5–15.5)
WBC: 6 10*3/uL (ref 4.0–10.5)
nRBC: 0 % (ref 0.0–0.2)

## 2022-04-28 LAB — TROPONIN I (HIGH SENSITIVITY): Troponin I (High Sensitivity): 10 ng/L (ref <18)

## 2022-04-28 LAB — COMPREHENSIVE METABOLIC PANEL
ALT: 34 U/L (ref 0–44)
AST: 35 U/L (ref 15–41)
Albumin: 4.8 g/dL (ref 3.5–5.0)
Alkaline Phosphatase: 48 U/L (ref 38–126)
Anion gap: 11 (ref 5–15)
BUN: 13 mg/dL (ref 8–23)
CO2: 27 mmol/L (ref 22–32)
Calcium: 9.9 mg/dL (ref 8.9–10.3)
Chloride: 102 mmol/L (ref 98–111)
Creatinine, Ser: 1.01 mg/dL (ref 0.61–1.24)
GFR, Estimated: >60 mL/min (ref >60)
Glucose, Bld: 128 mg/dL — ABNORMAL HIGH (ref 70–99)
Potassium: 4.4 mmol/L (ref 3.5–5.1)
Sodium: 140 mmol/L (ref 135–145)
Total Bilirubin: 0.6 mg/dL (ref 0.3–1.2)
Total Protein: 7.3 g/dL (ref 6.5–8.1)

## 2022-04-28 LAB — LIPASE, BLOOD: Lipase: 85 U/L — ABNORMAL HIGH (ref 11–51)

## 2022-04-28 MED ORDER — IOHEXOL 300 MG/ML  SOLN
100.0000 mL | Freq: Once | INTRAMUSCULAR | Status: AC | PRN
  Administered 2022-04-28: 85 mL via INTRAVENOUS

## 2022-04-28 MED ORDER — MINERAL OIL RE ENEM
1.0000 | ENEMA | Freq: Once | RECTAL | Status: AC
  Administered 2022-04-28: 1 via RECTAL
  Filled 2022-04-28: qty 1

## 2022-04-28 MED ORDER — MILK AND MOLASSES ENEMA
1.0000 | Freq: Once | RECTAL | Status: DC
Start: 2022-04-28 — End: 2022-04-28

## 2022-04-28 NOTE — ED Provider Notes (Signed)
[facility] EMERGENCY DEPT Provider Note   CSN: [id number] Arrival date & time: 04/28/22  1047     History Chief Complaint  Patient presents with   Constipation    Richard Hatfield is a 66 y.o. male with history of anoxic brain injury, CABG, diabetes, CAD, hypertension presents the emergency room for evaluation of chronic constipation.  Patient arrives via EMS.  He was complaining of constipation for the past 2 weeks however has a bowel movement every other day.  He reports it goes between diarrhea and hard stools.  He is currently taking MiraLAX and a laxative, had multiple bowel movements yesterday.  He denies any abdominal pain.  Denies any melena, hematochezia, dysuria, hematuria, fever, nausea, vomiting, chest pain, or shortness of breath.  He reports that he is still eating okay however this morning he did not really have much of an appetite.  His wife appears later and discussed with me the this problems been happening frequently and he gets anxious/panicked whenever he does not have a bowel movement first thing in the morning and feels like he cannot eat because "he is afraid he will explode on the inside." She would like a referral to neurology. She is concerned that he won't eat and that he is a diabetic giving himself this medication.    Constipation Associated symptoms: diarrhea   Associated symptoms: no abdominal pain, no dysuria, no fever and no vomiting        Home Medications Prior to Admission medications   Medication Sig Start Date End Date Taking? Authorizing Provider  acetaminophen (TYLENOL) 325 MG tablet Take 1-2 tablets (325-650 mg total) by mouth every 4 (four) hours as needed for mild pain. 03/20/20   Love, Ivan Anchors, PA-C  amLODipine (NORVASC) 5 MG tablet Take 1.5 tablets (7.5 mg total) by mouth daily. 11/11/21   Troy Sine, MD  aspirin 81 MG chewable tablet Chew 1 tablet (81 mg total) by mouth daily. 03/09/20   Alma Friendly, MD  calcium  carbonate (TUMS - DOSED IN MG ELEMENTAL CALCIUM) 500 MG chewable tablet Chew 1 tablet (200 mg of elemental calcium total) by mouth 2 (two) times daily. Patient taking differently: Chew 1 tablet by mouth 3 (three) times daily. 03/20/20   Love, Ivan Anchors, PA-C  clopidogrel (PLAVIX) 75 MG tablet Take 1 tablet (75 mg total) by mouth daily. 04/10/20   Nani Skillern, PA-C  empagliflozin (JARDIANCE) 25 MG TABS tablet Take 1 tablet (25 mg total) by mouth daily. 03/20/20   Love, Ivan Anchors, PA-C  EPINEPHrine 0.3 mg/0.3 mL IJ SOAJ injection Inject 1 mL into the skin See admin instructions. 02/24/11   [provider]  fenofibrate (TRICOR) 145 MG tablet Take 1 tablet by mouth daily. 12/09/18   [provider]  glucose blood (ONETOUCH VERIO) test strip 1 each by Other route daily. 04/17/15   [provider]  insulin aspart (NOVOLOG) 100 UNIT/ML injection Inject 12 Units into the skin 3 (three) times daily before meals.    [provider]  insulin degludec (TRESIBA) 100 UNIT/ML FlexTouch Pen Inject 0.18 mLs (18 Units total) into the skin 2 (two) times daily. Patient taking differently: Inject 32 Units into the skin 2 (two) times daily. 03/20/20   Love, Ivan Anchors, PA-C  Insulin Pen Needle (B-D ULTRAFINE III SHORT PEN) 31G X 8 MM MISC USE 4 TIMES A DAY AS DIRECTED 01/20/19   [provider]  irbesartan (AVAPRO) 300 MG tablet Take 1 tablet (300  mg total) by mouth daily. 01/04/21   Deberah Pelton, NP  isosorbide dinitrate (ISORDIL) 10 MG tablet Take 1 tablet (10 mg total) by mouth 3 (three) times daily. For one month then stop. 04/10/20   Lars Pinks M, PA-C  LAGEVRIO 200 MG CAPS capsule SMARTSIG:4 Capsule(s) By Mouth Every 12 Hours 08/20/21   [provider]  Lancets (ONETOUCH DELICA PLUS PPIRJJ88C) [place] DAILY    [provider]  metoprolol tartrate (LOPRESSOR) 25 MG tablet Take 0.5 tablets (12.5 mg total) by  mouth 2 (two) times daily. 04/11/20   Nani Skillern, PA-C  Multiple Vitamin (MULTIVITAMIN WITH MINERALS) TABS tablet Take 1 tablet by mouth daily. 03/20/20   Love, Ivan Anchors, PA-C  pantoprazole (PROTONIX) 20 MG tablet Take 20 mg by mouth daily. 04/12/22   [provider]  pantoprazole (PROTONIX) 40 MG tablet Take 40 mg by mouth 2 (two) times daily. 02/28/22   [provider]  polyethylene glycol (MIRALAX / GLYCOLAX) 17 g packet Take 17 g by mouth 2 (two) times daily. Patient taking differently: Take 17 g by mouth daily as needed for moderate constipation. 03/20/20   Love, Ivan Anchors, PA-C  rosuvastatin (CRESTOR) 40 MG tablet Take 1 tablet (40 mg total) by mouth daily. 03/20/20   Love, Ivan Anchors, PA-C  VASCEPA 1 g capsule Take 2 g by mouth 2 (two) times daily. 12/24/21   [provider]      Allergies    Bee venom    Review of Systems   Review of Systems  Constitutional:  Negative for chills and fever.  Respiratory:  Negative for shortness of breath.   Cardiovascular:  Negative for chest pain.  Gastrointestinal:  Positive for constipation and diarrhea. Negative for abdominal pain, rectal pain and vomiting.  Genitourinary:  Negative for dysuria and hematuria.    Physical Exam Updated Vital Signs BP (!) 161/82   Pulse 82   Temp 98.5 F (36.9 C) (Oral)   Resp 18   Ht '5\' 9"'$  (1.753 m)   Wt 88.9 kg   SpO2 100%   BMI 28.94 kg/m  Physical Exam Vitals and nursing note reviewed.  Constitutional:      General: He is not in acute distress.    Appearance: Normal appearance. He is not ill-appearing or toxic-appearing.  HENT:     Head: Normocephalic and atraumatic.     Mouth/Throat:     Mouth: Mucous membranes are moist.  Eyes:     General: No scleral icterus. Cardiovascular:     Rate and Rhythm: Normal rate and regular rhythm.  Pulmonary:     Effort: Pulmonary effort is normal. No respiratory distress.     Breath sounds: Normal breath sounds.  Abdominal:      General: Bowel sounds are normal. There is no distension.     Palpations: Abdomen is soft.     Tenderness: There is no abdominal tenderness. There is no guarding or rebound.     Comments: Small area of bruising and increase adipose tissue noted to the lower left abdomen with injection sites. No hernia palpated. NBS. Abdomen nontender to palpation.   Musculoskeletal:        General: No deformity.     Cervical back: Normal range of motion.  Skin:    General: Skin is warm and dry.  Neurological:     General: No focal deficit present.     Mental Status: He is alert. Mental status  is at baseline.     ED Results / Procedures / Treatments   Labs (all labs ordered are listed, but only abnormal results are displayed) Labs Reviewed  COMPREHENSIVE METABOLIC PANEL - Abnormal; Notable for the following components:      Result Value   Glucose, Bld 128 (*)    All other components within normal limits  URINALYSIS, ROUTINE W REFLEX MICROSCOPIC - Abnormal; Notable for the following components:   Specific Gravity, Urine >1.046 (*)    Glucose, UA >1,000 (*)    Ketones, ur TRACE (*)    All other components within normal limits  LIPASE, BLOOD - Abnormal; Notable for the following components:   Lipase 85 (*)    All other components within normal limits  CBG MONITORING, ED - Abnormal; Notable for the following components:   Glucose-Capillary 114 (*)    All other components within normal limits  CBC WITH DIFFERENTIAL/PLATELET    EKG None  Radiology CT ABDOMEN PELVIS W CONTRAST  Result Date: 04/28/2022 CLINICAL DATA:  Constipated for 2 weeks, last bowel movement yesterday, lower abdominal pain, pressure and discomfort EXAM: CT ABDOMEN AND PELVIS WITH CONTRAST TECHNIQUE: Multidetector CT imaging of the abdomen and pelvis was performed using the standard protocol following bolus administration of intravenous contrast. RADIATION DOSE REDUCTION: This exam was performed according to the departmental  dose-optimization program which includes automated exposure control, adjustment of the mA and/or kV according to patient size and/or use of iterative reconstruction technique. CONTRAST:  77m OMNIPAQUE IOHEXOL 300 MG/ML  SOLN COMPARISON:  None Available. FINDINGS: Lower chest: Remote median sternotomy. Aortic valve calcifications noted. Normal heart size. No pericardial or pleural effusion. Clear lung bases. Hepatobiliary: No focal liver abnormality is seen. No gallstones, gallbladder wall thickening, or biliary dilatation. Pancreas: Unremarkable. No pancreatic ductal dilatation or surrounding inflammatory changes. Spleen: Normal in size without focal abnormality. Adrenals/Urinary Tract: Normal adrenal glands. Similar minor scattered areas of renal cortical scarring. No renal obstruction or hydronephrosis. No hydroureter or ureteral abnormality appreciated. Bladder unremarkable. Stomach/Bowel: Stomach is within normal limits. Appendix appears normal. No evidence of bowel wall thickening, distention, or inflammatory changes. Vascular/Lymphatic: Minor aortic atherosclerosis. No aneurysm or occlusive process. Mesenteric and renal vasculature appear to remain patent. No veno-occlusive finding. No bulky adenopathy. Reproductive: No significant finding by CT. Other: Similar anterior lower abdominal wall subcutaneous soft tissue stranding, images 61 through 70/2. This remains nonspecific for abdominal wall cellulitis or panniculitis. No associated hernia. Musculoskeletal: No acute or significant osseous findings. IMPRESSION: No acute intra-abdominal or pelvic finding by CT. Negative for bowel obstruction. Similar midline lower anterior abdominal wall subcutaneous strandy edema/inflammation nonspecific for dominant wall cellulitis or panniculitis. No fluid collection or abscess. No associated hernia. Aortic Atherosclerosis (ICD10-I70.0). Electronically Signed   By: MJerilynn Mages  Shick M.D.   On: 04/28/2022 12:48     Procedures Procedures   Medications Ordered in ED Medications  iohexol (OMNIPAQUE) 300 MG/ML solution 100 mL (85 mLs Intravenous Contrast Given 04/28/22 1224)  mineral oil enema 1 enema (1 enema Rectal Given 04/28/22 1419)    ED Course/ Medical Decision Making/ A&P Clinical Course as of 04/29/22 1953  Mon Apr 28, 2022  1641 CT ABDOMEN PELVIS W CONTRAST [RR]    Clinical Course User Index [RR] RSherrell Puller PA-C                           Medical Decision Making Amount and/or Complexity of Data Reviewed Labs: ordered. Radiology: ordered. Decision-making  details documented in ED Course.  Risk OTC drugs. Prescription drug management.   66 year old male presents to the emerged department for evaluation of constipation.  Differential diagnosis includes but is not limited to ileus, chronic constipation, small bowel obstruction, colitis, diverticulitis.  Vital signs show slightly elevated blood pressure otherwise normal.  Physical exam as noted above.  We will order labs and imaging.  I independently reviewed and interpreted the patient's labs.  Lipase is slightly elevated to 85 however CT is reassuring, nonspecific.  CBC without leukocytosis or anemia.  CMP shows mildly elevated glucose at 128 although patient is known diabetic.  No electrolyte or LFT abnormalities.  Urinalysis shows concentrated urine with greater than thousand glucose.  There is trace ketones otherwise unremarkable.  Likely patient not drinking enough fluids. Troponins at 10.  CT abdomen shows  No acute intra-abdominal or pelvic finding by CT. Negative for bowel obstruction. Similar midline lower anterior abdominal wall subcutaneous strandy edema/inflammation nonspecific for dominant wall cellulitis or panniculitis. No fluid collection or abscess. No associated hernia. Aortic Atherosclerosis (ICD10-I70.0).  Upon looking at this site, it is correlated there where the patient does his insulin injections.  I recommended  that he switch to another spot as this improves with his insulin uptake as well.  The cellulitis/panniculitis reading on CT was also seen in his previous one on 04/16/22.  On previous chart review, patient was seen on 04-16-2022 for hypoglycemia and constipation as well.  Appears that patient has been seen here several times for similar complaints.  His wife discussed with me privately that she is concerned for his anxiety/cognition.  She was seen here on 04-10-2022 for his decreasing cognition/anxiety.  She is concerned that his anoxic brain injury from a few years prior is what is causing this.  She reports that she canceled her appoint with a neurologist and would like for a follow-up.  No worsening of symptoms.  He had a normal MRI at this time.  We will send referral for [facility] neurology.  Discussed with patient we could use an enema for his constipation although his stool burdens mild, patient agreeable.  I was leaving the patient's lightheadedness was from his anxiety as well as his bordcerline orthostatic hypotension given his vital signs.  I encouraged him that he can eat and drink even when he is constipated.  Reports that it is normal to not have a bowel movement every day.  Advised him to stick with his bowel regiment.  I do not think the patient has any small bowel obstruction, colitis, or diverticulitis given his CT findings.  Doubt any ileus.  I doubt any ACS or arrhythmia causing these symptoms as well.  We will have him follow-up with [facility] neurology as well as follow-up with his outpatient GI provider.  I discussed with him and wife at bedside his lab and imaging results.  Discussed the need to follow-up with a GI provider.  Referral for the neurologist given per wife's request.  Recommended staying well-hydrated with plenty of fluids, mainly water as the patient's constipation may be due to his decrease in fluid intake.  Discussed strict return precautions and red flag symptoms.   Patient and wife verbalized understanding and agreed with the plan.  Patient is stable being discharged home in good condition.  I discussed this case with my attending physician who cosigned this note including patient's presenting symptoms, physical exam, and planned diagnostics and interventions. Attending physician stated agreement with plan or made changes to plan which  were implemented.   Attending physician assessed patient at bedside.  Final Clinical Impression(s) / ED Diagnoses Final diagnoses:  Constipation, unspecified constipation type    Rx / DC Orders ED Discharge Orders     None         Sherrell Puller, PA-C 04/29/22 1956    Pattricia Boss, MD 04/30/22 1048

## 2022-04-28 NOTE — Discharge Instructions (Addendum)
You were seen in the ER for evaluation of your diarrhea/constipation. You were given an enema here to help with this. Please make sure you are drinking plenty of fluids to keep your stool soft and bowel hydrated. Stay on your already established bowel regimen of Miralax. I would like for you to follow up with your GI provider. I have also listed the information for a neurologist for you to follow up with as well. Make sure you change your insulin site as well. Additionally, I think you have borderline orthostatic hypotension, meaning when you stand up you get lightheaded. I have included information on how to prevent/improve the symptoms to this discharge paperwork. If you have any concerns, new or worsening symptoms, please return to the ER for evaluation.   Contact a health care provider if you have: Three or fewer bowel movements a week. Stools that are hard or lumpy. Blood on the toilet paper or in your stool after you have a bowel movement. Unexplained weight loss. Rectum (rectal) pain. Stool leakage. Nausea or vomiting. Get help right away if you have: Rectal bleeding or you pass blood clots. Severe rectal pain. Body tissue that pushes out (protrudes) from your anus. Severe pain or bloating (distension) in your abdomen. Vomiting that you cannot control.

## 2022-04-28 NOTE — ED Triage Notes (Signed)
Pt arrived via [facility] from home. Pt caox4 and ambulatory. Pt c/o constipation x2 weeks. Pt reports last BM was yesterday. Pt reports he has tried Miralax and multiple enemas but still reports discomfort and pressure in lower abd.   EMS VS BP 148/90 HR 72 SpO2 98% RA  CBG 142

## 2022-05-09 ENCOUNTER — Encounter (HOSPITAL_BASED_OUTPATIENT_CLINIC_OR_DEPARTMENT_OTHER): Payer: BC Managed Care – PPO

## 2022-06-03 ENCOUNTER — Emergency Department (HOSPITAL_COMMUNITY)
Admission: EM | Admit: 2022-06-03 | Discharge: 2022-06-03 | Discharge status: Against Medical Advice | Payer: BC Managed Care – PPO | Attending: Emergency Medicine | Admitting: Emergency Medicine

## 2022-06-03 ENCOUNTER — Emergency Department (HOSPITAL_COMMUNITY): Payer: BC Managed Care – PPO

## 2022-06-03 ENCOUNTER — Encounter (HOSPITAL_COMMUNITY): Payer: Self-pay

## 2022-06-03 DIAGNOSIS — R531 Weakness: Secondary | ICD-10-CM | POA: Insufficient documentation

## 2022-06-03 DIAGNOSIS — R519 Headache, unspecified: Secondary | ICD-10-CM | POA: Insufficient documentation

## 2022-06-03 DIAGNOSIS — H538 Other visual disturbances: Secondary | ICD-10-CM | POA: Insufficient documentation

## 2022-06-03 DIAGNOSIS — Z5321 Procedure and treatment not carried out due to patient leaving prior to being seen by health care provider: Secondary | ICD-10-CM | POA: Diagnosis not present

## 2022-06-03 LAB — APTT: aPTT: 30 seconds (ref 24–36)

## 2022-06-03 LAB — DIFFERENTIAL
Abs Immature Granulocytes: 0.03 10*3/uL (ref 0.00–0.07)
Basophils Absolute: 0.1 10*3/uL (ref 0.0–0.1)
Basophils Relative: 1 %
Eosinophils Absolute: 0.1 10*3/uL (ref 0.0–0.5)
Eosinophils Relative: 1 %
Immature Granulocytes: 0 %
Lymphocytes Relative: 14 %
Lymphs Abs: 1.2 10*3/uL (ref 0.7–4.0)
Monocytes Absolute: 0.7 10*3/uL (ref 0.1–1.0)
Monocytes Relative: 9 %
Neutro Abs: 6.4 10*3/uL (ref 1.7–7.7)
Neutrophils Relative %: 75 %

## 2022-06-03 LAB — CBC
HCT: 44.6 % (ref 39.0–52.0)
Hemoglobin: 14.6 g/dL (ref 13.0–17.0)
MCH: 30.4 pg (ref 26.0–34.0)
MCHC: 32.7 g/dL (ref 30.0–36.0)
MCV: 92.9 fL (ref 80.0–100.0)
Platelets: 173 10*3/uL (ref 150–400)
RBC: 4.8 MIL/uL (ref 4.22–5.81)
RDW: 12.5 % (ref 11.5–15.5)
WBC: 8.5 10*3/uL (ref 4.0–10.5)
nRBC: 0 % (ref 0.0–0.2)

## 2022-06-03 LAB — I-STAT CHEM 8, ED
BUN: 15 mg/dL (ref 8–23)
Calcium, Ion: 1.21 mmol/L (ref 1.15–1.40)
Chloride: 104 mmol/L (ref 98–111)
Creatinine, Ser: 0.9 mg/dL (ref 0.61–1.24)
Glucose, Bld: 90 mg/dL (ref 70–99)
HCT: 45 % (ref 39.0–52.0)
Hemoglobin: 15.3 g/dL (ref 13.0–17.0)
Potassium: 4.6 mmol/L (ref 3.5–5.1)
Sodium: 141 mmol/L (ref 135–145)
TCO2: 26 mmol/L (ref 22–32)

## 2022-06-03 LAB — COMPREHENSIVE METABOLIC PANEL
ALT: 28 U/L (ref 0–44)
AST: 30 U/L (ref 15–41)
Albumin: 4.2 g/dL (ref 3.5–5.0)
Alkaline Phosphatase: 53 U/L (ref 38–126)
Anion gap: 10 (ref 5–15)
BUN: 12 mg/dL (ref 8–23)
CO2: 25 mmol/L (ref 22–32)
Calcium: 10.1 mg/dL (ref 8.9–10.3)
Chloride: 106 mmol/L (ref 98–111)
Creatinine, Ser: 0.94 mg/dL (ref 0.61–1.24)
GFR, Estimated: >60 mL/min (ref >60)
Glucose, Bld: 94 mg/dL (ref 70–99)
Potassium: 4.7 mmol/L (ref 3.5–5.1)
Sodium: 141 mmol/L (ref 135–145)
Total Bilirubin: 0.7 mg/dL (ref 0.3–1.2)
Total Protein: 7.1 g/dL (ref 6.5–8.1)

## 2022-06-03 LAB — PROTIME-INR
INR: 1.2 (ref 0.8–1.2)
Prothrombin Time: 14.8 seconds (ref 11.4–15.2)

## 2022-06-03 LAB — CBG MONITORING, ED
Glucose-Capillary: 95 mg/dL (ref 70–99)
Glucose-Capillary: 70 mg/dL (ref 70–99)
Glucose-Capillary: 157 mg/dL — ABNORMAL HIGH (ref 70–99)

## 2022-06-03 MED ORDER — SODIUM CHLORIDE 0.9 % IV BOLUS: 500.0000 mL | Freq: Once | INTRAVENOUS | Status: DC

## 2022-06-03 MED ORDER — SODIUM CHLORIDE 0.9 % IV SOLN: 100.0000 mL/h | INTRAVENOUS | Status: DC

## 2022-06-03 NOTE — ED Notes (Signed)
Pt requesting CBG check due to hx frequently low sugars and feeling 'off'. CBG 70. Snack given after passing stroke swallow screening.

## 2022-06-03 NOTE — ED Notes (Signed)
Patient states he checked mychart and will follow up with neurology. IV removed by this NT.

## 2022-06-03 NOTE — ED Provider Triage Note (Signed)
Emergency Medicine Provider Triage Evaluation Note  SHONDELL POULSON , a 66 y.o. male  was evaluated arrived to the facility at the EMS bay.  Patient was presenting with weakness, possible speech difficulty.  On my exam the patient is speaking slowly, but clearly has no facial asymmetry is moving all extremities spontaneously.  He does acknowledge weakness in his lower extremities, but also states that he has discomfort about his thorax and abdomen.  Per EMS the patient had weakness, possible speech changes earlier in the day, weakness as possibly present for about 1 month. Review of Systems  Positive: As above Negative: Facial asymmetry, clear speech difficulty currently, obvious discoordination  Physical Exam  There were no vitals taken for this visit. Gen:   Awake, no distress   Resp:  Normal effort  MSK:   Moves extremities without difficulty  Other:  No gross deformities  Medical Decision Making  Medically screening exam initiated at 4:05 PM.  Appropriate orders placed.  RONOLD HARDGROVE was informed that the remainder of the evaluation will be completed by another provider, this initial triage assessment does not replace that evaluation, and the importance of remaining in the ED until their evaluation is complete.   Carmin Muskrat, MD 06/03/22 1606

## 2022-06-03 NOTE — ED Triage Notes (Signed)
Pt's wife assisting with triage. Pt arrives via [facility] with headache and stroke like symptoms. Pt's wife states that between 0600 and 0700 he stated he was feeling unwell. Pt slower to converse than usual per pt's wife. Difficulty following commands. Pt also c/o blurry vision ongoing in his R eye, worsening today.

## 2022-06-05 ENCOUNTER — Ambulatory Visit: Payer: BC Managed Care – PPO | Admitting: Neurology

## 2022-06-05 ENCOUNTER — Encounter: Payer: Self-pay | Admitting: Neurology

## 2022-06-05 VITALS — BP 111/70 | HR 56 | Ht 69.0 in | Wt 194.0 lb

## 2022-06-05 DIAGNOSIS — G629 Polyneuropathy, unspecified: Secondary | ICD-10-CM | POA: Diagnosis not present

## 2022-06-05 NOTE — Patient Instructions (Addendum)
Continue current medications  Continue with aggressive glucose control  We will check a Vitamin B12 level  Follow up with behavioral medicine  Increase physical activity, at least 20 min a day, 5 days a week  Return as needed

## 2022-06-05 NOTE — Progress Notes (Signed)
[facility]  PATIENT: Richard Hatfield DOB: 06-10-1956  REQUESTING CLINICIAN: Tisovec, Fransico Him, MD HISTORY FROM: Patient and spouse  REASON FOR VISIT: Bilateral lower extremity numbness (thigh and feet)   HISTORICAL  CHIEF COMPLAINT:  Chief Complaint  Patient presents with   New Patient (Initial Visit)    Rm 13, with wife  NP/Urgent paper proficient/Richard Tisovec MD [facility] [phone] of anoxic brain injury, leg numbness/  Today c/o numbness in legs, and cold feet while laying down     HISTORY OF PRESENT ILLNESS:  This is a 66 year old gentleman past medical history of myocardial infarction complicated by cardiac arrest and anoxic brain injury, hypertension, hyperlipidemia, diabetes mellitus, who is presenting with complaint of bilateral lower extremities numbness.  Patient reports intermittent numbness in the bilateral thighs, right greater than left for the past couple months.  Denies any pain only reported abnormal skin sensation.  He also reports numbness in both feet, again no pain.  He noticed that more at night when he laying in bed, under the covers. Denies any pain, denies any weakness, denies any fall.  He presented to his cardiologist and was cleared.  He reported couple days ago presented to the ED due to slurred speech, CT scan at that time was negative for any acute stroke. Patient wife reported patient has lately increased stress and they are awaiting behavioral health appointment    OTHER MEDICAL CONDITIONS: Anoxic brain injury, MI, Anxiety, Diabetes Mellitus, Hyperlipidemia    REVIEW OF SYSTEMS: Full 14 system review of systems performed and negative with exception of: As noted in the HPI   ALLERGIES: Allergies  Allergen Reactions   Bee Venom Anaphylaxis    HOME MEDICATIONS: Outpatient Medications Prior to Visit  Medication Sig Dispense Refill   acetaminophen (TYLENOL) 325 MG tablet Take 1-2 tablets (325-650 mg  total) by mouth every 4 (four) hours as needed for mild pain.     amLODipine (NORVASC) 5 MG tablet Take 1.5 tablets (7.5 mg total) by mouth daily. 45 tablet 11   aspirin 81 MG chewable tablet Chew 1 tablet (81 mg total) by mouth daily.     calcium carbonate (TUMS - DOSED IN MG ELEMENTAL CALCIUM) 500 MG chewable tablet Chew 1 tablet (200 mg of elemental calcium total) by mouth 2 (two) times daily. (Patient taking differently: Chew 1 tablet by mouth 3 (three) times daily.) 60 tablet 0   clopidogrel (PLAVIX) 75 MG tablet Take 1 tablet (75 mg total) by mouth daily. 30 tablet 0   empagliflozin (JARDIANCE) 25 MG TABS tablet Take 1 tablet (25 mg total) by mouth daily. 30 tablet 0   EPINEPHrine 0.3 mg/0.3 mL IJ SOAJ injection Inject 1 mL into the skin See admin instructions.     glucose blood (ONETOUCH VERIO) test strip 1 each by Other route daily.     insulin aspart (NOVOLOG) 100 UNIT/ML injection Inject 12 Units into the skin 3 (three) times daily before meals.     insulin degludec (TRESIBA) 100 UNIT/ML FlexTouch Pen Inject 0.18 mLs (18 Units total) into the skin 2 (two) times daily. (Patient taking differently: Inject 32 Units into the skin 2 (two) times daily.)     Insulin Pen Needle (B-D ULTRAFINE III SHORT PEN) 31G X 8 MM MISC USE 4 TIMES A DAY AS DIRECTED     irbesartan (AVAPRO) 300 MG tablet Take 1 tablet (300 mg total) by mouth daily. 90 tablet 1   LAGEVRIO 200 MG CAPS capsule SMARTSIG:4 Capsule(s)  By Mouth Every 12 Hours     Lancets (ONETOUCH DELICA PLUS [id number]) MISC USE TO SELF MONITOR BLOOD GLUCOSE THREE TIMES DAILY     metoprolol tartrate (LOPRESSOR) 25 MG tablet Take 0.5 tablets (12.5 mg total) by mouth 2 (two) times daily. 30 tablet 1   Multiple Vitamin (MULTIVITAMIN WITH MINERALS) TABS tablet Take 1 tablet by mouth daily.     pantoprazole (PROTONIX) 20 MG tablet Take 20 mg by mouth daily.     pantoprazole (PROTONIX) 40 MG tablet Take 40 mg by mouth 2 (two) times daily.     polyethylene  glycol (MIRALAX / GLYCOLAX) 17 g packet Take 17 g by mouth 2 (two) times daily. (Patient taking differently: Take 17 g by mouth daily as needed for moderate constipation.) 60 each 0   rosuvastatin (CRESTOR) 40 MG tablet Take 1 tablet (40 mg total) by mouth daily. 30 tablet 0   fenofibrate (TRICOR) 145 MG tablet Take 1 tablet by mouth daily.     isosorbide dinitrate (ISORDIL) 10 MG tablet Take 1 tablet (10 mg total) by mouth 3 (three) times daily. For one month then stop. 90 tablet 0   VASCEPA 1 g capsule Take 2 g by mouth 2 (two) times daily.     No facility-administered medications prior to visit.    PAST MEDICAL HISTORY: Past Medical History:  Diagnosis Date   Cardiac arrest ([place]) 02/28/2020   Coronary artery disease 02/2020   pt admitted for STEMI in 02/2020 (per pt)   Diabetes mellitus without complication ([place])    Hypertension    Myocardial infarction ([place])    per pt he had a heart attack earlier (02/2020) - see encounters    PAST SURGICAL HISTORY: Past Surgical History:  Procedure Laterality Date   CORONARY ARTERY BYPASS GRAFT N/A 04/06/2020   Procedure: CORONARY ARTERY BYPASS GRAFTING (CABG), ON PUMP, TIMES FOUR, USING BILATERAL INTERNAL MAMMARY ARTERIES AND HARVESTED LEFT RADIAL ARTERY (OPEN);  Surgeon: Wonda Olds, MD;  Location: [place];  Service: Open Heart Surgery;  Laterality: N/A;   LEFT HEART CATH AND CORONARY ANGIOGRAPHY N/A 02/28/2020   Procedure: LEFT HEART CATH AND CORONARY ANGIOGRAPHY;  Surgeon: Troy Sine, MD;  Location: [facility];  Service: Cardiovascular;  Laterality: N/A;   RADIAL ARTERY HARVEST Left 04/06/2020   Procedure: RADIAL ARTERY HARVEST;  Surgeon: Wonda Olds, MD;  Location: [place];  Service: Open Heart Surgery;  Laterality: Left;   TEE WITHOUT CARDIOVERSION N/A 04/06/2020   Procedure: TRANSESOPHAGEAL ECHOCARDIOGRAM (TEE);  Surgeon: Wonda Olds, MD;  Location: [place];  Service: Open Heart Surgery;  Laterality: N/A;    FAMILY  HISTORY: History reviewed. No pertinent family history.  SOCIAL HISTORY: Social History   Socioeconomic History   Marital status: Married    Spouse name: Wilma Wuthrich   Number of children: Not on file   Years of education: Not on file   Highest education level: Not on file  Occupational History   Not on file  Tobacco Use   Smoking status: Never   Smokeless tobacco: Never  Vaping Use   Vaping Use: Never used  Substance and Sexual Activity   Alcohol use: Not Currently   Drug use: Never   Sexual activity: Not Currently    Partners: Female  Other Topics Concern   Not on file  Social History Narrative   Not on file   Social Determinants of Health   Financial Resource Strain: Not on file  Food Insecurity: Not  on file  Transportation Needs: Not on file  Physical Activity: Not on file  Stress: Not on file  Social Connections: Not on file  Intimate Partner Violence: Not on file    PHYSICAL EXAM  GENERAL EXAM/CONSTITUTIONAL: Vitals:  Vitals:   06/05/22 1516  BP: 111/70  Pulse: (!) 56  Weight: 194 lb (88 kg)  Height: '5\' 9"'$  (1.753 m)   Body mass index is 28.65 kg/m. Wt Readings from Last 3 Encounters:  06/05/22 194 lb (88 kg)  04/28/22 196 lb (88.9 kg)  04/16/22 194 lb (88 kg)   Patient is in no distress; well developed, nourished and groomed; neck is supple  EYES: Pupils round and reactive to light, Visual fields full to confrontation, Extraocular movements intacts,   MUSCULOSKELETAL: Gait, strength, tone, movements noted in Neurologic exam below  NEUROLOGIC: MENTAL STATUS:      No data to display         awake, alert, oriented to person, place and time recent and remote memory intact normal attention and concentration language fluent, comprehension intact, naming intact fund of knowledge appropriate  CRANIAL NERVE:  2nd, 3rd, 4th, 6th - pupils equal and reactive to light, visual fields full to confrontation, extraocular muscles intact, no  nystagmus 5th - facial sensation symmetric 7th - facial strength symmetric 8th - hearing intact 9th - palate elevates symmetrically, uvula midline 11th - shoulder shrug symmetric 12th - tongue protrusion midline  MOTOR:  normal bulk and tone, full strength in the BUE, BLE  SENSORY:  Decrease sensation to pinprick and vibration to both feet up to ankle  COORDINATION:  finger-nose-finger, fine finger movements normal  REFLEXES:  deep tendon reflexes present and symmetric  GAIT/STATION:  normal   DIAGNOSTIC DATA (LABS, IMAGING, TESTING) - I reviewed patient records, labs, notes, testing and imaging myself where available.  Lab Results  Component Value Date   WBC 8.5 06/03/2022   HGB 15.3 06/03/2022   HCT 45.0 06/03/2022   MCV 92.9 06/03/2022   PLT 173 06/03/2022      Component Value Date/Time   NA 141 06/03/2022 1646   NA 141 04/10/2021 0856   K 4.6 06/03/2022 1646   CL 104 06/03/2022 1646   CO2 25 06/03/2022 1640   GLUCOSE 90 06/03/2022 1646   BUN 15 06/03/2022 1646   BUN 12 04/10/2021 0856   CREATININE 0.90 06/03/2022 1646   CALCIUM 10.1 06/03/2022 1640   PROT 7.1 06/03/2022 1640   PROT 7.2 04/10/2021 0856   ALBUMIN 4.2 06/03/2022 1640   ALBUMIN 4.8 04/10/2021 0856   AST 30 06/03/2022 1640   ALT 28 06/03/2022 1640   ALKPHOS 53 06/03/2022 1640   BILITOT 0.7 06/03/2022 1640   BILITOT <0.2 04/10/2021 0856   GFRNONAA >60 06/03/2022 1640   GFRAA >60 04/08/2020 0456   Lab Results  Component Value Date   CHOL 158 04/10/2021   HDL 37 (L) 04/10/2021   LDLCALC 63 04/10/2021   LDLDIRECT 61.9 02/28/2020   TRIG 374 (H) 04/10/2021   CHOLHDL 4.3 04/10/2021   Lab Results  Component Value Date   HGBA1C 8.2 (H) 04/04/2020   Lab Results  Component Value Date   VITAMINB12 951 (H) 03/03/2020   Lab Results  Component Value Date   TSH 1.637 04/10/2022    Head CT 06/03/22 No acute intracranial findings.   MRI Brain 04/10/22 Normal brain MRI.   ASSESSMENT  AND PLAN  66 y.o. year old male with myocardial infarction complicated by cardiac arrest and  anoxic brain injury, hypertension, hyperlipidemia, diabetes mellitus, who is presenting with complaint of intermittent bilateral lower extremities numbness consistent with peripheral neuropathy.  I have explained to the patient that neuropathy is less likely related to his anoxic brain injury or myocardial infraction and more likely related to his diagnosis of diabetes mellitus.  Wife reports that he takes a lot of medications, he does not have any pain therefore they will defer medication.  I think it is reasonable. I will recommend aggressive glucose control and also recommend patient to start exercising, at least 20 minutes a day 5 days a week, increase physical activity.  Continue to follow with PCP return as needed    1. Neuropathy     Patient Instructions  Continue current medications  Continue with aggressive glucose control  We will check a Vitamin B12 level  Follow up with behavioral medicine  Increase physical activity, at least 20 min a day, 5 days a week  Return as needed   Orders Placed This Encounter  Procedures   Vitamin B12    No orders of the defined types were placed in this encounter.   Return if symptoms worsen or fail to improve.    Alric Ran, MD 06/05/2022, 4:59 PM  [facility] [address], [place], [place] [address] [phone]

## 2022-06-06 LAB — VITAMIN B12: Vitamin B-12: 703 pg/mL (ref 232–1245)

## 2022-07-25 ENCOUNTER — Encounter (HOSPITAL_BASED_OUTPATIENT_CLINIC_OR_DEPARTMENT_OTHER): Payer: Self-pay

## 2022-07-25 ENCOUNTER — Ambulatory Visit (INDEPENDENT_AMBULATORY_CARE_PROVIDER_SITE_OTHER): Payer: BC Managed Care – PPO | Admitting: Family

## 2022-07-25 ENCOUNTER — Encounter (HOSPITAL_BASED_OUTPATIENT_CLINIC_OR_DEPARTMENT_OTHER): Payer: Self-pay | Admitting: Family

## 2022-07-25 VITALS — BP 126/62 | HR 52 | Ht 69.0 in | Wt 180.4 lb

## 2022-07-25 DIAGNOSIS — I10 Essential (primary) hypertension: Secondary | ICD-10-CM

## 2022-07-25 DIAGNOSIS — I25118 Atherosclerotic heart disease of native coronary artery with other forms of angina pectoris: Secondary | ICD-10-CM

## 2022-07-25 DIAGNOSIS — E785 Hyperlipidemia, unspecified: Secondary | ICD-10-CM

## 2022-07-25 NOTE — Progress Notes (Signed)
Office Visit    Patient Name: Richard Hatfield Date of Encounter: 07/25/2022  PCP:  Haywood Pao, MD   [place]  Cardiologist:  Shelva Majestic, MD  Advanced Practice Provider:  No care team member to display Electrophysiologist:  None      Chief Complaint    Richard Hatfield is a 66 y.o. male presents today for CAD follow up  Past Medical History    Past Medical History:  Diagnosis Date   Cardiac arrest ([place]) 02/28/2020   Coronary artery disease 02/2020   pt admitted for STEMI in 02/2020 (per pt)   Diabetes mellitus without complication ([place])    Hypertension    Myocardial infarction ([place])    per pt he had a heart attack earlier (02/2020) - see encounters   Past Surgical History:  Procedure Laterality Date   CORONARY ARTERY BYPASS GRAFT N/A 04/06/2020   Procedure: CORONARY ARTERY BYPASS GRAFTING (CABG), ON PUMP, TIMES FOUR, USING BILATERAL INTERNAL MAMMARY ARTERIES AND HARVESTED LEFT RADIAL ARTERY (OPEN);  Surgeon: Wonda Olds, MD;  Location: [place];  Service: Open Heart Surgery;  Laterality: N/A;   LEFT HEART CATH AND CORONARY ANGIOGRAPHY N/A 02/28/2020   Procedure: LEFT HEART CATH AND CORONARY ANGIOGRAPHY;  Surgeon: Troy Sine, MD;  Location: [facility];  Service: Cardiovascular;  Laterality: N/A;   RADIAL ARTERY HARVEST Left 04/06/2020   Procedure: RADIAL ARTERY HARVEST;  Surgeon: Wonda Olds, MD;  Location: [place];  Service: Open Heart Surgery;  Laterality: Left;   TEE WITHOUT CARDIOVERSION N/A 04/06/2020   Procedure: TRANSESOPHAGEAL ECHOCARDIOGRAM (TEE);  Surgeon: Wonda Olds, MD;  Location: [place];  Service: Open Heart Surgery;  Laterality: N/A;    Allergies  Allergies  Allergen Reactions   Bee Venom Anaphylaxis    History of Present Illness    Richard Hatfield is a 66 y.o. male with a hx of hypertension, GERD, hyperlipidemia, diabetes mellitus, CAD s/p CABG X4 03/2020, aortic stenosis last seen  03/20/22  Presented to [facility]/2020 after out of hospital cardiac arrest witnessed by wife.  CPR initiated promptly.  Cardiac catheterization revealed significant multivessel CAD with wall motion hypocontractility in anterior lateral wall.  Initial anoxic encephalopathy and following stabilization went to inpatient rehab with significant improvement in return to baseline cognitive function.  Underwent CABG X4 April 06, 2020 by Dr. Orvan Seen with LIMA to LAD, left radial to diagonal and marginal, RIMA-posterior lateral branch of the RCA.  He did have postoperative atrial fibrillation treated with amiodarone which was eventually able to be discontinued.  Repeat echo 04/2021 EF 60-65%, mild aortic stenosis (mean gradient 16 mmHg)  Seen 03/20/2022 with predominant complaint of bilateral lower extremity numbness.  More consistent with peripheral neuropathy.  He requested further cardiac workup and ABI, aortic/iliac duplex were ordered.  He subsequently was evaluated by neurology and diagnosis of mild neuropathy was confirmed and he opted not to pursue further evaluation.  ED visit 04/04/2022 with hypoglycemia.  ED visit 04/10/2022 with chest discomfort -EKG no acute changes, troponin unremarkable, MRI brain negative.  ED visit 8/30 and 04/28/2022 with constipation.  Repeat ED visit 06/03/2022 with head CT negative-she did not stay to be evaluated.  Resents today for follow-up with his wife.  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  Has had some stressors with retirement with depression established with psychiatry.  Has had  some issues with constipation and diarrhea which have improved with Linzess and Colace.  Denies chest pain, exertional dyspnea, lightheadedness, orthopnea, PND.   EKGs/Labs/Other Studies Reviewed:   The following studies were reviewed today:  LHC 02/2020   Prox LAD to Mid LAD lesion is 60% stenosed. 1st  Diag lesion is 75% stenosed. Mid LAD lesion is 80% stenosed. Prox Cx lesion is 70% stenosed. Prox Cx to Mid Cx lesion is 80% stenosed. Prox RCA-1 lesion is 20% stenosed. Prox RCA-2 lesion is 70% stenosed. RV Branch lesion is 90% stenosed. Dist RCA-1 lesion is 80% stenosed. Dist RCA-2 lesion is 85% stenosed. RPDA lesion is 30% stenosed. The left ventricular ejection fraction is 45-50% by visual estimate. LV end diastolic pressure is normal. There is mild left ventricular systolic dysfunction.   Out of hospital witnessed VF cardiac arrest with return of ROSC after approximately 20 minutes of CPR and administration of 2 doses of epinephrine.   Significant three-vessel CAD with 60% diffuse proximal LAD stenosis, long diffuse 70% diagonal stenosis and 80% LAD stenosis after the first diagonal vessel; 70 to 80% proximal diffuse circumflex stenosis before a large marginal branch; and very large dominant RCA with 70% proximal stenosis and long diffuse 80 and 85% stenoses beyond the acute margin proximal to the PDA takeoff with mild 30% narrowing in the PDA.   Mild acute LV dysfunction with focal anterolateral hypocontractility and EF estimated 45 to 50%. LVEDP 12 mm   RECOMMENDATION: Suspect transient coronary vasospasm involving the LAD circulation in the etiology of the patient's VF cardiac arrest. Low-dose IV nitroglycerin was started at the end of the catheterization procedure. Patient will be transported to to heart and evaluated by critical care with consideration for hypothermia due to witnessed cardiac arrest. A 2D echo Doppler study will be obtained. We will review angios with colleagues but with diffuse multivessel CAD in this diabetic male consider possible surgical consultation for CABG revascularization following stability.         EKG: No EKG today  Recent Labs: 04/10/2022: TSH 1.637 06/03/2022: ALT 28; BUN 15; Creatinine, Ser 0.90; Hemoglobin 15.3; Platelets 173; Potassium 4.6;  Sodium 141  Recent Lipid Panel    Component Value Date/Time   CHOL 158 04/10/2021 0856   TRIG 374 (H) 04/10/2021 0856   HDL 37 (L) 04/10/2021 0856   CHOLHDL 4.3 04/10/2021 0856   CHOLHDL NOT REPORTED DUE TO HIGH TRIGLYCERIDES 02/28/2020 0126   VLDL UNABLE TO CALCULATE IF TRIGLYCERIDE OVER 400 mg/dL 02/28/2020 0126   LDLCALC 63 04/10/2021 0856   LDLDIRECT 61.9 02/28/2020 0126    Home Medications   Current Meds  Medication Sig   acetaminophen (TYLENOL) 325 MG tablet Take 1-2 tablets (325-650 mg total) by mouth every 4 (four) hours as needed for mild pain.   ALPHAGAN P 0.1 % SOLN Place 1 drop into both eyes daily.   ALPRAZolam (XANAX) 0.25 MG tablet Take 0.25 mg by mouth 3 (three) times daily as needed for anxiety.   amLODipine (NORVASC) 5 MG tablet Take 1.5 tablets (7.5 mg total) by mouth daily.   aspirin 81 MG chewable tablet Chew 1 tablet (81 mg total) by mouth daily.   calcium carbonate (TUMS - DOSED IN MG ELEMENTAL CALCIUM) 500 MG chewable tablet Chew 1 tablet (200 mg of elemental calcium total) by mouth 2 (two) times daily. (Patient taking differently: Chew 1 tablet by mouth 3 (three) times daily.)   clopidogrel (PLAVIX) 75 MG tablet Take 1 tablet (75 mg total) by mouth  daily.   docusate sodium (COLACE) 100 MG capsule Take 200 mg by mouth 2 (two) times daily.   empagliflozin (JARDIANCE) 25 MG TABS tablet Take 1 tablet (25 mg total) by mouth daily.   EPINEPHrine 0.3 mg/0.3 mL IJ SOAJ injection Inject 1 mL into the skin See admin instructions.   escitalopram (LEXAPRO) 20 MG tablet Take 20 mg by mouth daily.   glucose blood (ONETOUCH VERIO) test strip 1 each by Other route daily.   insulin aspart (NOVOLOG) 100 UNIT/ML injection Inject 8 Units into the skin 3 (three) times daily before meals.   insulin degludec (TRESIBA FLEXTOUCH) 100 UNIT/ML FlexTouch Pen Inject 30 Units into the skin daily.   Insulin Pen Needle (B-D ULTRAFINE III SHORT PEN) 31G X 8 MM MISC USE 4 TIMES A DAY AS  DIRECTED   irbesartan (AVAPRO) 300 MG tablet Take 1 tablet (300 mg total) by mouth daily.   Lancets (ONETOUCH DELICA PLUS [id number]) MISC USE TO SELF MONITOR BLOOD GLUCOSE THREE TIMES DAILY   LINZESS 72 MCG capsule Take 72 mcg by mouth daily before breakfast.   metoprolol tartrate (LOPRESSOR) 25 MG tablet Take 0.5 tablets (12.5 mg total) by mouth 2 (two) times daily.   Multiple Vitamin (MULTIVITAMIN WITH MINERALS) TABS tablet Take 1 tablet by mouth daily.   pantoprazole (PROTONIX) 40 MG tablet Take 40 mg by mouth 2 (two) times daily.   polyethylene glycol (MIRALAX / GLYCOLAX) 17 g packet Take 17 g by mouth 2 (two) times daily. (Patient taking differently: Take 17 g by mouth daily as needed for moderate constipation.)   rosuvastatin (CRESTOR) 40 MG tablet Take 1 tablet (40 mg total) by mouth daily.     Review of Systems      All other systems reviewed and are otherwise negative except as noted above.  Physical Exam    VS:  BP 126/62 (BP Location: Right Arm, Patient Position: Sitting, Cuff Size: Normal)   Pulse (!) 52   Ht '5\' 9"'$  (1.753 m)   Wt 180 lb 6.4 oz (81.8 kg)   SpO2 99%   BMI 26.64 kg/m  , BMI Body mass index is 26.64 kg/m.  Wt Readings from Last 3 Encounters:  07/25/22 180 lb 6.4 oz (81.8 kg)  06/05/22 194 lb (88 kg)  04/28/22 196 lb (88.9 kg)    GEN: Well nourished, well developed, in no acute distress. HEENT: normal. Neck: Supple, no JVD, carotid bruits, or masses. Cardiac: RRR, no murmurs, rubs, or gallops. No clubbing, cyanosis, edema.  Radials/PT 2+ and equal bilaterally.  Respiratory:  Respirations regular and unlabored, clear to auscultation bilaterally. GI: Soft, nontender, nondistended. MS: No deformity or atrophy. Skin: Warm and dry, no rash. Neuro:  Strength and sensation are intact. Psych: Normal affect.  Assessment & Plan    Leg numbness -evaluated by neurology and found to peripheral neuropathy recommended for conservative management.  Encouraged  walking regimen.  He has palpable pulses on exam and there is no indication for PAD evaluation at this time.   CAD s/p CABG - Stable with no anginal symptoms. No indication for ischemic evaluation.  GDMT aspirin, plavix, jardiance, isordil, metoprolol, rosuvastatin. Heart healthy diet and regular cardiovascular exercise encouraged.    HTN - BP well controlled. Continue current antihypertensive regimen.    HLD, LDL goal <70 - 12/2021 LDL 26. 02/2022 LDL 77. Eating well but not exercising much. He is agreeable increase physical activity and continue Rosuvastatin '40mg'$  QD. He and his wife are hesitant about additional medications.  Consider repeat lipid panel at follow up in 6 months.   DM2 - Continue to follow with PCP.        Disposition: Follow up in 6 month(s) with Shelva Majestic, MD or APP.  Signed, Loel Dubonnet, NP 07/25/2022, 5:07 PM [place]

## 2022-07-25 NOTE — Patient Instructions (Addendum)
Medication Instructions:  Continue your current medications.   *If you need a refill on your cardiac medications before your next appointment, please call your pharmacy*  Lab Work/Testing/Procedures: None ordered today.   Follow-Up: At [facility], you and your health needs are our priority.  As part of our continuing mission to provide you with exceptional heart care, we have created designated Provider Care Teams.  These Care Teams include your primary Cardiologist (physician) and Advanced Practice Providers (APPs -  Physician Assistants and Nurse Practitioners) who all work together to provide you with the care you need, when you need it.  Your next appointment:   6 month(s)  The format for your next appointment:   In Person  Provider:   Loel Dubonnet, NP   Other Instructions  Heart Healthy Diet Recommendations: A low-salt diet is recommended. Meats should be grilled, baked, or boiled. Avoid fried foods. Focus on lean protein sources like fish or chicken with vegetables and fruits. The American Heart Association is a Microbiologist!  American Heart Association Diet and Lifeystyle Recommendations   Exercise recommendations: The American Heart Association recommends 150 minutes of moderate intensity exercise weekly. Try 30 minutes of moderate intensity exercise 4-5 times per week. This could include walking, jogging, or swimming. Gradually increase walking in the house or in the neighborhood.   Important Information About Sugar

## 2022-09-26 ENCOUNTER — Other Ambulatory Visit: Payer: Self-pay | Admitting: Cardiovascular Disease

## 2023-03-10 ENCOUNTER — Ambulatory Visit (HOSPITAL_BASED_OUTPATIENT_CLINIC_OR_DEPARTMENT_OTHER): Payer: BC Managed Care – PPO | Admitting: Family

## 2023-04-16 ENCOUNTER — Other Ambulatory Visit: Payer: Self-pay | Admitting: Cardiovascular Disease

## 2023-05-05 ENCOUNTER — Ambulatory Visit (HOSPITAL_BASED_OUTPATIENT_CLINIC_OR_DEPARTMENT_OTHER): Payer: BC Managed Care – PPO | Admitting: Family

## 2023-06-29 ENCOUNTER — Ambulatory Visit (HOSPITAL_BASED_OUTPATIENT_CLINIC_OR_DEPARTMENT_OTHER): Payer: Medicare PPO | Admitting: Family

## 2023-06-29 ENCOUNTER — Encounter (HOSPITAL_BASED_OUTPATIENT_CLINIC_OR_DEPARTMENT_OTHER): Payer: Self-pay | Admitting: Family

## 2023-06-29 VITALS — BP 116/72 | HR 70 | Ht 69.0 in | Wt 218.4 lb

## 2023-06-29 DIAGNOSIS — I25118 Atherosclerotic heart disease of native coronary artery with other forms of angina pectoris: Secondary | ICD-10-CM

## 2023-06-29 DIAGNOSIS — E118 Type 2 diabetes mellitus with unspecified complications: Secondary | ICD-10-CM | POA: Diagnosis not present

## 2023-06-29 DIAGNOSIS — I10 Essential (primary) hypertension: Secondary | ICD-10-CM

## 2023-06-29 DIAGNOSIS — Z951 Presence of aortocoronary bypass graft: Secondary | ICD-10-CM

## 2023-06-29 DIAGNOSIS — Z794 Long term (current) use of insulin: Secondary | ICD-10-CM

## 2023-06-29 DIAGNOSIS — E785 Hyperlipidemia, unspecified: Secondary | ICD-10-CM

## 2023-06-29 MED ORDER — AMLODIPINE-OLMESARTAN 5-40 MG PO TABS: 1.0000 | ORAL_TABLET | Freq: Every day | ORAL | 3 refills | Status: DC

## 2023-06-29 NOTE — Progress Notes (Signed)
Cardiology Office Note:  .   Date:  06/29/2023  ID:  Richard Hatfield, DOB January 30, 1956, MRN [id number] PCP: Gaspar Garbe, MD  [facility] Providers Cardiologist:  Nicki Guadalajara, MD    History of Present Illness: Richard Kitchen   DVONTA Hatfield is a 67 y.o. male with a hx of hypertension, GERD, hyperlipidemia, diabetes mellitus, CAD s/p CABG X4 03/2020, aortic stenosis.   Presented to [facility] 03/01/2020 after out of hospital cardiac arrest witnessed by wife.  CPR initiated promptly.  Cardiac catheterization revealed significant multivessel CAD with wall motion hypocontractility in anterior lateral wall.  Initial anoxic encephalopathy and following stabilization went to inpatient rehab with significant improvement in return to baseline cognitive function.  Underwent CABG X4 April 06, 2020 by Dr. Vickey Sages with LIMA to LAD, left radial to diagonal and marginal, RIMA-posterior lateral branch of the RCA.  He did have postoperative atrial fibrillation treated with amiodarone which was eventually able to be discontinued.   Repeat echo 04/2021 EF 60-65%, mild aortic stenosis (mean gradient 16 mmHg)   Seen 03/20/2022 with predominant complaint of bilateral lower extremity numbness.  More consistent with peripheral neuropathy.  He requested further cardiac workup and ABI, aortic/iliac duplex were ordered.  He subsequently was evaluated by neurology and diagnosis of mild neuropathy was confirmed and he opted not to pursue further evaluation.   ED visit 04/04/2022 with hypoglycemia.  ED visit 04/10/2022 with chest discomfort -EKG no acute changes, troponin unremarkable, MRI brain negative.  ED visit 8/30 and 04/28/2022 with constipation.  Repeat ED visit 06/03/2022 with head CT negative -  did not stay to be evaluated.  Last seen 07/25/22 doing well from cardiac perspective. He wished to increase physical activity prior to making changes to lipids as LDL was 77 not at goal <70.    Presents today for follow-up with  his wife.  Weight today up 38 lbs since clinic visit one year ago. Notes not exercising regularly. Does note has had a few episodes of exertional dyspnea over the last month with more than usual activity during times which his wife states he "might have overdone it". No chest pain, edema, orthopnea, PND. EKG with stable lateral TWI.   ROS: Please see the history of present illness.    All other systems reviewed and are negative.   Studies Reviewed: .        Cardiac Studies & Procedures   CARDIAC CATHETERIZATION  CARDIAC CATHETERIZATION 02/28/2020  Narrative  Prox LAD to Mid LAD lesion is 60% stenosed.  1st Diag lesion is 75% stenosed.  Mid LAD lesion is 80% stenosed.  Prox Cx lesion is 70% stenosed.  Prox Cx to Mid Cx lesion is 80% stenosed.  Prox RCA-1 lesion is 20% stenosed.  Prox RCA-2 lesion is 70% stenosed.  RV Branch lesion is 90% stenosed.  Dist RCA-1 lesion is 80% stenosed.  Dist RCA-2 lesion is 85% stenosed.  RPDA lesion is 30% stenosed.  The left ventricular ejection fraction is 45-50% by visual estimate.  LV end diastolic pressure is normal.  There is mild left ventricular systolic dysfunction.  Out of hospital witnessed VF cardiac arrest with return of ROSC after approximately 20 minutes of CPR and administration of 2 doses of epinephrine.  Significant three-vessel CAD with 60% diffuse proximal LAD stenosis, long diffuse 70% diagonal stenosis and 80% LAD stenosis after the first diagonal vessel; 70 to 80% proximal diffuse circumflex stenosis before a large marginal branch; and very large dominant RCA with 70%  proximal stenosis and long diffuse 80 and 85% stenoses beyond the acute margin proximal to the PDA takeoff with mild 30% narrowing in the PDA.  Mild acute LV dysfunction with focal anterolateral hypocontractility and EF estimated 45 to 50%. LVEDP 12 mm  RECOMMENDATION: Suspect transient coronary vasospasm involving the LAD circulation in the etiology  of the patient's VF cardiac arrest. Low-dose IV nitroglycerin was started at the end of the catheterization procedure. Patient will be transported to to heart and evaluated by critical care with consideration for hypothermia due to witnessed cardiac arrest. A 2D echo Doppler study will be obtained. We will review angios with colleagues but with diffuse multivessel CAD in this diabetic male consider possible surgical consultation for CABG revascularization following stability.  Findings Coronary Findings Diagnostic  Dominance: Right  Left Anterior Descending Prox LAD to Mid LAD lesion is 60% stenosed. Mid LAD lesion is 80% stenosed.  First Diagonal Branch 1st Diag lesion is 75% stenosed.  Left Circumflex Prox Cx lesion is 70% stenosed. Prox Cx to Mid Cx lesion is 80% stenosed.  Right Coronary Artery Prox RCA-1 lesion is 20% stenosed. Prox RCA-2 lesion is 70% stenosed. Dist RCA-1 lesion is 80% stenosed. Dist RCA-2 lesion is 85% stenosed.  Right Ventricular Branch RV Branch lesion is 90% stenosed.  Right Posterior Descending Artery RPDA lesion is 30% stenosed.  Intervention  No interventions have been documented.     ECHOCARDIOGRAM  ECHOCARDIOGRAM COMPLETE 04/26/2021  Narrative ECHOCARDIOGRAM REPORT    Patient Name:   Richard Hatfield [facility] Date of Exam: 04/26/2021 Medical Rec #:  [id number]       Height:       69.0 in Accession #:    [id number]      Weight:       212.2 lb Date of Birth:  Jan 15, 1956       BSA:          2.119 m Patient Age:    64 years        BP:           140/88 mmHg Patient Gender: M               HR:           64 bpm. Exam Location:  [address]  Procedure: 2D Echo, Cardiac Doppler and Color Doppler  Indications:    I46.9 Cardiac arrest  History:        Patient has prior history of Echocardiogram examinations, most recent 02/28/2020. Previous Myocardial Infarction, Prior CABG, Arrythmias:Cardiac Arrest; Risk Factors:Hypertension, Diabetes and  Dyslipidemia.  Sonographer:    Samule Ohm RDCS Referring Phys: [phone] THOMAS A KELLY   Sonographer Comments: Technically difficult study due to poor echo windows and patient is morbidly obese. Image acquisition challenging due to patient body habitus. IMPRESSIONS   1. Left ventricular ejection fraction, by estimation, is 60 to 65%. The left ventricle has normal function. The left ventricle has no regional wall motion abnormalities. There is moderate left ventricular hypertrophy. Left ventricular diastolic parameters are consistent with Grade I diastolic dysfunction (impaired relaxation). 2. Right ventricular systolic function is normal. The right ventricular size is normal. There is normal pulmonary artery systolic pressure. The estimated right ventricular systolic pressure is 29.0 mmHg. 3. Left atrial size was mildly dilated. 4. The mitral valve is grossly normal. Trivial mitral valve regurgitation. 5. The aortic valve is tricuspid. There is mild calcification of the aortic valve. There is mild thickening of the aortic valve. Aortic valve regurgitation is  not visualized. Mild aortic valve stenosis. Aortic valve area, by VTI measures 1.58 cm. Aortic valve mean gradient measures 16.0 mmHg. Aortic valve Vmax measures 2.89 m/s. Peak gradient 33 mmHg and DI is 0.46. 6. The inferior vena cava is normal in size with greater than 50% respiratory variability, suggesting right atrial pressure of 3 mmHg.  Comparison(s): Changes from prior study are noted. 02/28/2020: LVEF 50-55%, mild AS - mean gradient 8 mmHg.  FINDINGS Left Ventricle: Left ventricular ejection fraction, by estimation, is 60 to 65%. The left ventricle has normal function. The left ventricle has no regional wall motion abnormalities. The left ventricular internal cavity size was normal in size. There is moderate left ventricular hypertrophy. Left ventricular diastolic parameters are consistent with Grade I diastolic dysfunction  (impaired relaxation). Indeterminate filling pressures.  Right Ventricle: The right ventricular size is normal. No increase in right ventricular wall thickness. Right ventricular systolic function is normal. There is normal pulmonary artery systolic pressure. The tricuspid regurgitant velocity is 2.55 m/s, and with an assumed right atrial pressure of 3 mmHg, the estimated right ventricular systolic pressure is 29.0 mmHg.  Left Atrium: Left atrial size was mildly dilated.  Right Atrium: Right atrial size was normal in size.  Pericardium: There is no evidence of pericardial effusion.  Mitral Valve: The mitral valve is grossly normal. Trivial mitral valve regurgitation.  Tricuspid Valve: The tricuspid valve is grossly normal. Tricuspid valve regurgitation is trivial.  Aortic Valve: The aortic valve is tricuspid. There is mild calcification of the aortic valve. There is mild thickening of the aortic valve. Aortic valve regurgitation is not visualized. Mild aortic stenosis is present. Aortic valve mean gradient measures 16.0 mmHg. Aortic valve peak gradient measures 33.4 mmHg. Aortic valve area, by VTI measures 1.58 cm.  Pulmonic Valve: The pulmonic valve was normal in structure. Pulmonic valve regurgitation is not visualized.  Aorta: The aortic root and ascending aorta are structurally normal, with no evidence of dilitation.  Venous: The inferior vena cava is normal in size with greater than 50% respiratory variability, suggesting right atrial pressure of 3 mmHg.  IAS/Shunts: No atrial level shunt detected by color flow Doppler.   LEFT VENTRICLE PLAX 2D LVIDd:         4.70 cm  Diastology LVIDs:         3.20 cm  LV e' medial:    7.94 cm/s LV PW:         1.30 cm  LV E/e' medial:  9.9 LV IVS:        1.60 cm  LV e' lateral:   13.60 cm/s LVOT diam:     2.10 cm  LV E/e' lateral: 5.8 LV SV:         82 LV SV Index:   39 LVOT Area:     3.46 cm   RIGHT VENTRICLE             IVC RV S  prime:     10.70 cm/s  IVC diam: 1.50 cm TAPSE (M-mode): 1.6 cm RVSP:           29.0 mmHg  LEFT ATRIUM             Index       RIGHT ATRIUM           Index LA diam:        5.30 cm 2.50 cm/m  RA Pressure: 3.00 mmHg LA Vol (A2C):   76.1 ml 35.92 ml/m RA Area:  15.30 cm LA Vol (A4C):   66.5 ml 31.39 ml/m RA Volume:   38.70 ml  18.27 ml/m LA Biplane Vol: 72.7 ml 34.31 ml/m AORTIC VALVE AV Area (Vmax):    1.35 cm AV Area (Vmean):   1.51 cm AV Area (VTI):     1.58 cm AV Vmax:           289.00 cm/s AV Vmean:          181.000 cm/s AV VTI:            0.521 m AV Peak Grad:      33.4 mmHg AV Mean Grad:      16.0 mmHg LVOT Vmax:         113.00 cm/s LVOT Vmean:        79.000 cm/s LVOT VTI:          0.238 m LVOT/AV VTI ratio: 0.46  AORTA Ao Root diam: 3.45 cm Ao Asc diam:  3.30 cm  MITRAL VALVE               TRICUSPID VALVE MV Area (PHT): 3.89 cm    TR Peak grad:   26.0 mmHg MV Decel Time: 195 msec    TR Vmax:        255.00 cm/s MV E velocity: 79.00 cm/s  Estimated RAP:  3.00 mmHg MV A velocity: 90.60 cm/s  RVSP:           29.0 mmHg MV E/A ratio:  0.87 SHUNTS Systemic VTI:  0.24 m Systemic Diam: 2.10 cm  Zoila Shutter MD Electronically signed by Zoila Shutter MD Signature Date/Time: 04/26/2021/2:48:49 PM    Final   TEE  ECHO INTRAOPERATIVE TEE 04/06/2020  Narrative *INTRAOPERATIVE TRANSESOPHAGEAL REPORT *    Patient Name:   Richard Hatfield [facility] Date of Exam: 04/06/2020 Medical Rec #:  [id number]       Height:       69.0 in Accession #:    [id number]      Weight:       194.9 lb Date of Birth:  Jan 05, 1956       BSA:          2.04 m Patient Age:    63 years        BP:           143/74 mmHg Patient Gender: M               HR:           55 bpm. Exam Location:  Inpatient  Transesophogeal exam was perform intraoperatively during surgical procedure. Patient was closely monitored under general anesthesia during the entirety of examination.  Indications:     R07.2  Precordial pain; I25.110 Atherosclerotic heart disease of native coronary artery with unstable angina pectoris Performing Phys: [id number] Ephriam Knuckles Z ATKINS Diagnosing Phys: Marcene Duos MD  Complications: No known complications during this procedure. POST-OP IMPRESSIONS - Left Ventricle: The left ventricle is unchanged from pre-bypass. - Right Ventricle: The right ventricle appears unchanged from pre-bypass. - Aorta: The aorta appears unchanged from pre-bypass. - Left Atrium: The left atrium appears unchanged from pre-bypass. - Left Atrial Appendage: The left atrial appendage appears unchanged from pre-bypass. - Aortic Valve: The aortic valve appears unchanged from pre-bypass. - Mitral Valve: The mitral valve appears unchanged from pre-bypass. - Tricuspid Valve: The tricuspid valve appears unchanged from pre-bypass. - Interatrial Septum: The interatrial septum appears unchanged from pre-bypass. - Interventricular Septum: The interventricular septum appears unchanged from pre-bypass. -  Pericardium: The pericardium appears unchanged from pre-bypass.  PRE-OP FINDINGS Left Ventricle: The left ventricle has normal systolic function, with an ejection fraction of 55-60%. The cavity size was normal. There is mild concentric left ventricular hypertrophy.  Right Ventricle: The right ventricle has normal systolic function. The cavity was normal. There is no increase in right ventricular wall thickness.  Left Atrium: Left atrial size was normal in size.  Right Atrium: Right atrial size was normal in size. Right atrial pressure is estimated at 10 mmHg.  Interatrial Septum: Probable small PFO identifited by color doppler with left to right shunting.  Pericardium: There is no evidence of pericardial effusion.  Mitral Valve: The mitral valve is normal in structure. Mitral valve regurgitation is not visualized by color flow Doppler.  Tricuspid Valve: The tricuspid valve was normal in structure.  Tricuspid valve regurgitation is trivial by color flow Doppler.  Aortic Valve: The aortic valve is tricuspid There is moderate thickening of the aortic valve and There is mild calcification of the aortic valve Aortic valve regurgitation was not visualized by color flow Doppler. There is mild stenosis of the aortic valve.  Pulmonic Valve: The pulmonic valve was normal in structure. Pulmonic valve regurgitation is trivial by color flow Doppler.   Aorta: The aortic root, ascending aorta and aortic arch are normal in size and structure.  +--------------+--------++ LEFT VENTRICLE         +--------------+--------++ PLAX 2D                +--------------+--------++ LVOT diam:    2.10 cm  +--------------+--------++ LVOT Area:    3.46 cm +--------------+--------++                        +--------------+--------++  +-------------+------------++ AORTIC VALVE              +-------------+------------++ AV Vmax:     198.00 cm/s  +-------------+------------++ AV Vmean:    131.500 cm/s +-------------+------------++ AV VTI:      0.422 m      +-------------+------------++ AV Peak Grad:15.7 mmHg    +-------------+------------++ AV Mean Grad:8.5 mmHg     +-------------+------------++   +--------------+-------+ SHUNTS                +--------------+-------+ Systemic Diam:2.10 cm +--------------+-------+   Marcene Duos MD Electronically signed by Marcene Duos MD Signature Date/Time: 04/06/2020/2:03:41 PM    Final            Risk Assessment/Calculations:             Physical Exam:   VS:  BP 116/72   Pulse 70   Ht 5\' 9"  (1.753 m)   Wt 218 lb 6.4 oz (99.1 kg)   SpO2 96%   BMI 32.25 kg/m    Wt Readings from Last 3 Encounters:  06/29/23 218 lb 6.4 oz (99.1 kg)  07/25/22 180 lb 6.4 oz (81.8 kg)  06/05/22 194 lb (88 kg)    GEN: Well nourished, overweight, well developed in no acute distress NECK: No JVD; No carotid  bruits CARDIAC: RRR, no murmurs, rubs, gallops RESPIRATORY:  Clear to auscultation without rales, wheezing or rhonchi  ABDOMEN: Soft, non-tender, non-distended EXTREMITIES:  No edema; No deformity   ASSESSMENT AND PLAN: .     CAD s/p CABG - EKG today stable lateral TWI. No chest pain. Has had exertional dyspnea over the last month which could be related to ischemia, deconditioning, or weight gain. However, if exertional dyspnea persists or  worsens he will contact office and consider ischemic eval such as Myoview or cardiac PET. GDMT aspirin, plavix, jardiance, isordil, metoprolol, rosuvastatin. Heart healthy diet and regular cardiovascular exercise encouraged.    HTN - BP well controlled. Having difficulty with Irbesartan pill size and number of pills. Stop Irbesartan, Amlodipine. Start Amlodipine-Olmesartan 5-40mg  daily.   HLD, LDL goal <70 - 12/2021 LDL 26. 02/2022 LDL 77. Has labs later this week with PCP. If LDL not at goal <70, plan to add Zetia vs Nexlizet vs PCSK9i.  DM2 - Continue to follow with PCP.        Dispo: follow up in 6 months  Signed, Alver Sorrow, NP

## 2023-06-29 NOTE — Patient Instructions (Signed)
Medication Instructions:  Your physician has recommended you make the following change in your medication:   STOP irbesartan STOP Amlodipine START Amlodipine-Irbesartan 5-40mg  daily at bedtime  *If you need a refill on your cardiac medications before your next appointment, please call your pharmacy*   Follow-Up: At [facility], you and your health needs are our priority.  As part of our continuing mission to provide you with exceptional heart care, we have created designated Provider Care Teams.  These Care Teams include your primary Cardiologist (physician) and Advanced Practice Providers (APPs -  Physician Assistants and Nurse Practitioners) who all work together to provide you with the care you need, when you need it.  We recommend signing up for the patient portal called "MyChart".  Sign up information is provided on this After Visit Summary.  MyChart is used to connect with patients for Virtual Visits (Telemedicine).  Patients are able to view lab/test results, encounter notes, upcoming appointments, etc.  Non-urgent messages can be sent to your provider as well.   To learn more about what you can do with MyChart, go to [email or web address].    Your next appointment:   6 month(s)  Provider:   Dr. Tresa Endo or Gillian Shields, NP  Other Instructions

## 2023-11-09 ENCOUNTER — Telehealth: Payer: Self-pay | Admitting: Family

## 2023-11-09 NOTE — Telephone Encounter (Signed)
 Returned call to patient and wife,     Wife states that he has put on a lot of weight. She states he is also having shortness of breath when he does any activity, like even putting on his boots. Been going on a couple months but recently gotten worse in the last couple weeks. She states that his belly is swollen. Still taking his normal medications.   Per last office note "However, if exertional dyspnea persists or worsens he will contact office and consider ischemic eval such as Myoview or cardiac PET. "   Will route to provider to see which test she would like to pursue

## 2023-11-09 NOTE — Telephone Encounter (Signed)
 Returned call to patient and wife to discuss provider response. Scheduled for OV tomorrow at 1510 with Edd Fabian, NP

## 2023-11-09 NOTE — Progress Notes (Unsigned)
 Cardiology Clinic Note   Patient Name: Richard Hatfield Date of Encounter: 11/10/2023  Primary Care Provider:  Gaspar Garbe, MD Primary Cardiologist:  Nicki Guadalajara, MD  Patient Profile    Richard Hatfield 68 year old male presents to the clinic today for evaluation of his increased shortness of breath.  Past Medical History    Past Medical History:  Diagnosis Date   Cardiac arrest (HCC) 02/28/2020   Coronary artery disease 02/2020   pt admitted for STEMI in 02/2020 (per pt)   Diabetes mellitus without complication (HCC)    Hypertension    Myocardial infarction [facility])    per pt he had a heart attack earlier (02/2020) - see encounters   Past Surgical History:  Procedure Laterality Date   CORONARY ARTERY BYPASS GRAFT N/A 04/06/2020   Procedure: CORONARY ARTERY BYPASS GRAFTING (CABG), ON PUMP, TIMES FOUR, USING BILATERAL INTERNAL MAMMARY ARTERIES AND HARVESTED LEFT RADIAL ARTERY (OPEN);  Surgeon: Linden Dolin, MD;  Location: [facility] OR;  Service: Open Heart Surgery;  Laterality: N/A;   LEFT HEART CATH AND CORONARY ANGIOGRAPHY N/A 02/28/2020   Procedure: LEFT HEART CATH AND CORONARY ANGIOGRAPHY;  Surgeon: Lennette Bihari, MD;  Location: [facility] INVASIVE CV LAB;  Service: Cardiovascular;  Laterality: N/A;   RADIAL ARTERY HARVEST Left 04/06/2020   Procedure: RADIAL ARTERY HARVEST;  Surgeon: Linden Dolin, MD;  Location: [facility] OR;  Service: Open Heart Surgery;  Laterality: Left;   TEE WITHOUT CARDIOVERSION N/A 04/06/2020   Procedure: TRANSESOPHAGEAL ECHOCARDIOGRAM (TEE);  Surgeon: Linden Dolin, MD;  Location: [facility] OR;  Service: Open Heart Surgery;  Laterality: N/A;    Allergies  Allergies  Allergen Reactions   Bee Venom Anaphylaxis    History of Present Illness    Richard Hatfield has a past medical history of cardiac arrest, ACS, coronary artery disease, type 2 diabetes, acute encephalopathy, anoxic brain injury, AKI, transaminitis, CABG x4 on 04/06/2020.  He was last seen by  me on 05/13/2021.  During that time he felt well.  He continued to be physically active doing yard work at home.  He was maintaining his low-sodium diet.  He checked his blood pressure regularly.  We reviewed his echocardiogram and cardiac ultrasound.  Follow-up was planned for 6 to 8 months.  He continue to follow-up with cardiology regularly.  He was last seen by Gillian Shields, NP on 06/29/2023.  He presented with his wife.  His weight was up about 38 pounds from 1 year prior.  He noted that he had not been exercising regularly.  He did note a few episodes of exertional dyspnea over the prior month when he was increasing his physical activity.  He denied chest pain, lower extremity swelling, orthopnea, PND.  His EKG was stable with lateral T wave inversion.  His blood pressure was noted to be 116/72.  He was instructed to contact office if exertional dyspnea worsened.  Ischemic evaluation was discussed with possibilities of Myoview versus cardiac PET.  His irbesartan and amlodipine were stopped and amlodipine-olmesartan 5 mg - 40 mg was started.  71-month follow-up was planned.  He contacted the nurse triage line 11/09/2023.  He noted increased work of breathing.  He was added to my schedule.  He presents to the clinic today for evaluation and states he notices increased work of breathing with increased physical activity.  His weight today is now 228 pounds.  He has gained about 50 pounds in the last year and 4 months.  When  asked about his diet his wife reports that he has been drinking several beers per day.  He reports that he consumes about 8 beers daily.  He does note that some days he does not drink.  We reviewed the calorie content of alcohol.  He and his wife expressed understanding.  I encouraged him to reduce slowly and quit EtOH.  They expressed understanding.  I also advised increased physical activity.  I will order a CBC today, BMP, order echocardiogram for DOE and plan follow-up after  testing.  Today he denies chest pain, lower extremity edema, fatigue, palpitations, melena, hematuria, hemoptysis, diaphoresis, weakness, presyncope, syncope, orthopnea, and PND.    Home Medications    Prior to Admission medications   Medication Sig Start Date End Date Taking? Authorizing Provider  acetaminophen (TYLENOL) 325 MG tablet Take 1-2 tablets (325-650 mg total) by mouth every 4 (four) hours as needed for mild pain. 03/20/20   Love, Evlyn Kanner, PA-C  ALPHAGAN P 0.1 % SOLN Place 1 drop into both eyes daily. 06/18/22   [provider]  ALPRAZolam Prudy Feeler) 0.25 MG tablet Take 0.25 mg by mouth 3 (three) times daily as needed for anxiety.    [provider]  amLODipine-olmesartan (AZOR) 5-40 MG tablet Take 1 tablet by mouth at bedtime. 06/29/23   Alver Sorrow, NP  aspirin 81 MG chewable tablet Chew 1 tablet (81 mg total) by mouth daily. 03/09/20   Briant Cedar, MD  calcium carbonate (TUMS - DOSED IN MG ELEMENTAL CALCIUM) 500 MG chewable tablet Chew 1 tablet (200 mg of elemental calcium total) by mouth 2 (two) times daily. Patient taking differently: Chew 1 tablet by mouth 3 (three) times daily. 03/20/20   Love, Evlyn Kanner, PA-C  clopidogrel (PLAVIX) 75 MG tablet Take 1 tablet (75 mg total) by mouth daily. 04/10/20   Ardelle Balls, PA-C  docusate sodium (COLACE) 100 MG capsule Take 200 mg by mouth 2 (two) times daily. Patient not taking: Reported on 06/29/2023 07/02/22   [provider]  empagliflozin (JARDIANCE) 25 MG TABS tablet Take 1 tablet (25 mg total) by mouth daily. 03/20/20   Love, Evlyn Kanner, PA-C  EPINEPHrine 0.3 mg/0.3 mL IJ SOAJ injection Inject 1 mL into the skin See admin instructions. 02/24/11   [provider]  escitalopram (LEXAPRO) 20 MG tablet Take 20 mg by mouth daily. 07/02/22   [provider]  glucose blood (ONETOUCH VERIO) test strip 1 each by Other route daily. 04/17/15   [provider]  insulin aspart  (NOVOLOG) 100 UNIT/ML injection Inject 8 Units into the skin 3 (three) times daily before meals.    [provider]  insulin degludec (TRESIBA FLEXTOUCH) 100 UNIT/ML FlexTouch Pen Inject 30 Units into the skin daily.    [provider]  Insulin Pen Needle (B-D ULTRAFINE III SHORT PEN) 31G X 8 MM MISC USE 4 TIMES A DAY AS DIRECTED 01/20/19   [provider]  Lancets (ONETOUCH DELICA PLUS LANCET33G) MISC USE TO SELF MONITOR BLOOD GLUCOSE THREE TIMES DAILY    [provider]  LINZESS 72 MCG capsule Take 72 mcg by mouth daily before breakfast. Patient not taking: Reported on 06/29/2023    [provider]  metoprolol tartrate (LOPRESSOR) 25 MG tablet Take 0.5 tablets (12.5 mg total) by mouth 2 (two) times daily. 04/11/20   Ardelle Balls, PA-C  Multiple Vitamin (MULTIVITAMIN WITH MINERALS) TABS tablet Take 1 tablet by mouth daily. 03/20/20   Love, Evlyn Kanner,  PA-C  pantoprazole (PROTONIX) 40 MG tablet Take 40 mg by mouth 2 (two) times daily. 02/28/22   [provider]  polyethylene glycol (MIRALAX / GLYCOLAX) 17 g packet Take 17 g by mouth 2 (two) times daily. Patient not taking: Reported on 06/29/2023 03/20/20   Love, Evlyn Kanner, PA-C  rosuvastatin (CRESTOR) 40 MG tablet Take 1 tablet (40 mg total) by mouth daily. 03/20/20   Jacquelynn Cree, PA-C    Family History    History reviewed. No pertinent family history. has no family status information on file.   Social History    Social History   Socioeconomic History   Marital status: Married    Spouse name: Gustave Lindeman   Number of children: Not on file   Years of education: Not on file   Highest education level: Not on file  Occupational History   Not on file  Tobacco Use   Smoking status: Never   Smokeless tobacco: Never  Vaping Use   Vaping status: Never Used  Substance and Sexual Activity   Alcohol use: Not Currently   Drug use: Never   Sexual activity: Not Currently    Partners:  Female  Other Topics Concern   Not on file  Social History Narrative   Not on file   Social Drivers of Health   Financial Resource Strain: Not on file  Food Insecurity: Not on file  Transportation Needs: Not on file  Physical Activity: Not on file  Stress: Not on file  Social Connections: Not on file  Intimate Partner Violence: Not on file     Review of Systems    General:  No chills, fever, night sweats or weight changes.  Cardiovascular:  No chest pain, dyspnea on exertion, edema, orthopnea, palpitations, paroxysmal nocturnal dyspnea. Dermatological: No rash, lesions/masses Respiratory: No cough, dyspnea Urologic: No hematuria, dysuria Abdominal:   No nausea, vomiting, diarrhea, bright red blood per rectum, melena, or hematemesis Neurologic:  No visual changes, wkns, changes in mental status. All other systems reviewed and are otherwise negative except as noted above.  Physical Exam    VS:  BP 118/66 (BP Location: Right Arm, Patient Position: Sitting, Cuff Size: Large)   Pulse 66   Ht 5\' 9"  (1.753 m)   Wt 228 lb (103.4 kg)   SpO2 93%   BMI 33.67 kg/m  , BMI Body mass index is 33.67 kg/m. GEN: Well nourished, well developed, in no acute distress. HEENT: normal. Neck: Supple, no JVD, carotid bruits, or masses. Cardiac: RRR, no murmurs, rubs, or gallops. No clubbing, cyanosis, edema.  Radials/DP/PT 2+ and equal bilaterally.  Respiratory:  Respirations regular and unlabored, clear to auscultation bilaterally. GI: Soft, nontender, nondistended, BS + x 4. MS: no deformity or atrophy. Skin: warm and dry, no rash. Neuro:  Strength and sensation are intact. Psych: Normal affect.  Accessory Clinical Findings    Recent Labs: No results found for requested labs within last 365 days.   Recent Lipid Panel    Component Value Date/Time   CHOL 158 04/10/2021 0856   TRIG 374 (H) 04/10/2021 0856   HDL 37 (L) 04/10/2021 0856   CHOLHDL 4.3 04/10/2021 0856   CHOLHDL NOT  REPORTED DUE TO HIGH TRIGLYCERIDES 02/28/2020 0126   VLDL UNABLE TO CALCULATE IF TRIGLYCERIDE OVER 400 mg/dL 16/05/9603 5409   LDLCALC 63 04/10/2021 0856   LDLDIRECT 61.9 02/28/2020 0126         ECG personally reviewed by me today- EKG Interpretation Date/Time:  Tuesday November 10 2023 15:20:49 EDT Ventricular Rate:  66 PR Interval:  146 QRS Duration:  84 QT Interval:  420 QTC Calculation: 440 R Axis:   47  Text Interpretation: Normal sinus rhythm Possible Left atrial enlargement T wave abnormality, consider anterolateral ischemia When compared with ECG of 29-Jun-2023 15:00, No significant change was found Confirmed by Edd Fabian [phone]) on 11/10/2023 3:22:14 PM   Echocardiogram 02/28/2020 IMPRESSIONS     1. Normal wall motion in visualized segments. Mid to distal anterolateral  wall not well visualized on apical images. Consider repeat with echo  contrast for wall motion when patient less agitated.. Left ventricular  ejection fraction, by estimation, is 50   to 55%. The left ventricle has low normal function. The left ventricle  has no regional wall motion abnormalities. There is mild concentric left  ventricular hypertrophy. Left ventricular diastolic parameters were  normal.   2. Right ventricular systolic function is normal. The right ventricular  size is normal. There is mildly elevated pulmonary artery systolic  pressure.   3. Left atrial size was mildly dilated.   4. The mitral valve is normal in structure. Trivial mitral valve  regurgitation. No evidence of mitral stenosis.   5. The aortic valve has an indeterminant number of cusps. Aortic valve  regurgitation is not visualized. Mild aortic valve stenosis.   Comparison(s): No prior Echocardiogram.    Echocardiogram 04/26/2021 IMPRESSIONS     1. Left ventricular ejection fraction, by estimation, is 60 to 65%. The  left ventricle has normal function. The left ventricle has no regional  wall motion abnormalities.  There is moderate left ventricular hypertrophy.  Left ventricular diastolic  parameters are consistent with Grade I diastolic dysfunction (impaired  relaxation).   2. Right ventricular systolic function is normal. The right ventricular  size is normal. There is normal pulmonary artery systolic pressure. The  estimated right ventricular systolic pressure is 29.0 mmHg.   3. Left atrial size was mildly dilated.   4. The mitral valve is grossly normal. Trivial mitral valve  regurgitation.   5. The aortic valve is tricuspid. There is mild calcification of the  aortic valve. There is mild thickening of the aortic valve. Aortic valve  regurgitation is not visualized. Mild aortic valve stenosis. Aortic valve  area, by VTI measures 1.58 cm.  Aortic valve mean gradient measures 16.0 mmHg. Aortic valve Vmax measures  2.89 m/s. Peak gradient 33 mmHg and DI is 0.46.   6. The inferior vena cava is normal in size with greater than 50%  respiratory variability, suggesting right atrial pressure of 3 mmHg.   Comparison(s): Changes from prior study are noted. 02/28/2020: LVEF 50-55%,  mild AS - mean gradient 8 mmHg.   FINDINGS   Left Ventricle: Left ventricular ejection fraction, by estimation, is 60  to 65%. The left ventricle has normal function. The left ventricle has no  regional wall motion abnormalities. The left ventricular internal cavity  size was normal in size. There is   moderate left ventricular hypertrophy. Left ventricular diastolic  parameters are consistent with Grade I diastolic dysfunction (impaired  relaxation). Indeterminate filling pressures.   Right Ventricle: The right ventricular size is normal. No increase in  right ventricular wall thickness. Right ventricular systolic function is  normal. There is normal pulmonary artery systolic pressure. The tricuspid  regurgitant velocity is 2.55 m/s, and   with an assumed right atrial pressure of 3 mmHg, the estimated right   ventricular systolic pressure is 29.0 mmHg.  Left Atrium: Left atrial size was mildly dilated.   Right Atrium: Right atrial size was normal in size.   Pericardium: There is no evidence of pericardial effusion.   Mitral Valve: The mitral valve is grossly normal. Trivial mitral valve  regurgitation.   Tricuspid Valve: The tricuspid valve is grossly normal. Tricuspid valve  regurgitation is trivial.   Aortic Valve: The aortic valve is tricuspid. There is mild calcification  of the aortic valve. There is mild thickening of the aortic valve. Aortic  valve regurgitation is not visualized. Mild aortic stenosis is present.  Aortic valve mean gradient measures   16.0 mmHg. Aortic valve peak gradient measures 33.4 mmHg. Aortic valve  area, by VTI measures 1.58 cm.   Pulmonic Valve: The pulmonic valve was normal in structure. Pulmonic valve  regurgitation is not visualized.   Aorta: The aortic root and ascending aorta are structurally normal, with  no evidence of dilitation.   Venous: The inferior vena cava is normal in size with greater than 50%  respiratory variability, suggesting right atrial pressure of 3 mmHg.   IAS/Shunts: No atrial level shunt detected by color flow Doppler.      Assessment & Plan   1.  DOE, shortness of breath-contacted nurse triage line on 11/09/2023.  Reported increased work of breathing.  EKG today shows sinus 66 bpm .  Weight today 228 pounds.  Is sedentary at home.  Symptoms appear to be related to decreased activity tolerance and body habitus. Order echocardiogram CBC, BMP Increase physical activity as tolerated  Coronary artery disease/status post CABG x4-denies anginal type symptoms.  Underwent CABG 8/21.   Continue aspirin, Plavix, Jardiance, rosuvastatin Heart healthy low-sodium diet-salty 6 reviewed Maintain physical activity as tolerated   Hyperlipidemia-LDL 47 on 07/01/23. Continue rosuvastatin Heart healthy low-sodium high-fiber  diet Continue to monitor  Essential hypertension-BP today 118/66.  He has not been monitoring at home. Continue amlodipine, olmesartan, metoprolol Heart healthy low-sodium diet-salty 6 given Increase physical activity as tolerated Maintain blood pressure log   EtOH abuse-consuming on average 8 beers per day.  Reviewed calorie content of EtOH/beer. Reduce EtOH slowly EtOH cessation recommended    Disposition: Follow-up with Gillian Shields, NP-C or me after echocardiogram.   Thomasene Ripple. Zalayah Pizzuto NP-C     11/10/2023, 3:26 PM [facility] [address] Office [phone] Fax [phone]    I spent 14 minutes examining this patient, reviewing medications, and using patient centered shared decision making involving their cardiac care.   I spent  20 minutes reviewing past medical history,  medications, and prior cardiac tests.

## 2023-11-09 NOTE — Telephone Encounter (Signed)
 Pt c/o Shortness Of Breath: STAT if SOB developed within the last 24 hours or pt is noticeably SOB on the phone  1. Are you currently SOB (can you hear that pt is SOB on the phone)? No  2. How long have you been experiencing SOB? Few weeks  3. Are you SOB when sitting or when up moving around? Both  4. Are you currently experiencing any other symptoms? Weak not feeling well

## 2023-11-09 NOTE — Telephone Encounter (Signed)
 Last seen 06/2023. Would be best for OV prior to deciding on testing. Is he available tomorrow to see me at 1:30PM for EKG and OV? I put a hold on that slot.   Alver Sorrow, NP

## 2023-11-10 ENCOUNTER — Encounter: Payer: Self-pay | Admitting: General Practice

## 2023-11-10 ENCOUNTER — Ambulatory Visit: Attending: General Practice | Admitting: General Practice

## 2023-11-10 VITALS — BP 118/66 | HR 66 | Ht 69.0 in | Wt 228.0 lb

## 2023-11-10 DIAGNOSIS — Z951 Presence of aortocoronary bypass graft: Secondary | ICD-10-CM

## 2023-11-10 DIAGNOSIS — I482 Chronic atrial fibrillation, unspecified: Secondary | ICD-10-CM | POA: Diagnosis not present

## 2023-11-10 DIAGNOSIS — R0609 Other forms of dyspnea: Secondary | ICD-10-CM | POA: Diagnosis not present

## 2023-11-10 DIAGNOSIS — I10 Essential (primary) hypertension: Secondary | ICD-10-CM

## 2023-11-10 DIAGNOSIS — E785 Hyperlipidemia, unspecified: Secondary | ICD-10-CM

## 2023-11-10 NOTE — Patient Instructions (Addendum)
 Medication Instructions:  The current medical regimen is effective;  continue present plan and medications as directed. Please refer to the Current Medication list given to you today.  *If you need a refill on your cardiac medications before your next appointment, please call your pharmacy*  Lab Work: CBC AND BMET TODAY If you have labs (blood work) drawn today and your tests are completely normal, you will receive your results only by:  MyChart Message (if you have MyChart) OR  A paper copy in the mail If you have any lab test that is abnormal or we need to change your treatment, we will call you to review the results.  Testing/Procedures: Your physician has requested that you have an echocardiogram. Echocardiography is a painless test that uses sound waves to create images of your heart. It provides your doctor with information about the size and shape of your heart and how well your heart's chambers and valves are working. This procedure takes approximately one hour. There are no restrictions for this procedure. Please do NOT wear cologne, perfume, aftershave, or lotions (deodorant is allowed). Please arrive 15 minutes prior to your appointment time.  Please note: We ask at that you not bring children with you during ultrasound (echo/ vascular) testing. Due to room size and safety concerns, children are not allowed in the ultrasound rooms during exams. Our front office staff cannot provide observation of children in our lobby area while testing is being conducted. An adult accompanying a patient to their appointment will only be allowed in the ultrasound room at the discretion of the ultrasound technician under special circumstances. We apologize for any inconvenience.   Follow-Up: At [facility], you and your health needs are our priority.  As part of our continuing mission to provide you with exceptional heart care, we have created designated Provider Care Teams.  These Care Teams  include your primary Cardiologist (physician) and Advanced Practice Providers (APPs -  Physician Assistants and Nurse Practitioners) who all work together to provide you with the care you need, when you need it.  Your next appointment:   AFTER ECHO   Provider:   Nicki Guadalajara, MD  or Edd Fabian, FNP or Gillian Shields, NP-C   Other Instructions DECREASE CALORIES WEAN OFF YOUR BEER CONSUMPTION  INCREASE PHYSICAL ACTIVITY

## 2023-11-11 LAB — BASIC METABOLIC PANEL
BUN/Creatinine Ratio: 15 (ref 10–24)
BUN: 18 mg/dL (ref 8–27)
CO2: 20 mmol/L (ref 20–29)
Calcium: 10.1 mg/dL (ref 8.6–10.2)
Chloride: 103 mmol/L (ref 96–106)
Creatinine, Ser: 1.21 mg/dL (ref 0.76–1.27)
Glucose: 248 mg/dL — ABNORMAL HIGH (ref 70–99)
Potassium: 4.7 mmol/L (ref 3.5–5.2)
Sodium: 139 mmol/L (ref 134–144)
eGFR: 66 mL/min/{1.73_m2} (ref >59)

## 2023-11-11 LAB — CBC
Hematocrit: 42.3 % (ref 37.5–51.0)
Hemoglobin: 14.1 g/dL (ref 13.0–17.7)
MCH: 31.5 pg (ref 26.6–33.0)
MCHC: 33.3 g/dL (ref 31.5–35.7)
MCV: 94 fL (ref 79–97)
Platelets: 164 10*3/uL (ref 150–450)
RBC: 4.48 x10E6/uL (ref 4.14–5.80)
RDW: 13.1 % (ref 11.6–15.4)
WBC: 6.5 10*3/uL (ref 3.4–10.8)

## 2023-11-17 DIAGNOSIS — Z794 Long term (current) use of insulin: Secondary | ICD-10-CM | POA: Diagnosis not present

## 2023-11-17 DIAGNOSIS — N182 Chronic kidney disease, stage 2 (mild): Secondary | ICD-10-CM | POA: Diagnosis not present

## 2023-11-17 DIAGNOSIS — E1129 Type 2 diabetes mellitus with other diabetic kidney complication: Secondary | ICD-10-CM | POA: Diagnosis not present

## 2023-11-17 DIAGNOSIS — I2510 Atherosclerotic heart disease of native coronary artery without angina pectoris: Secondary | ICD-10-CM | POA: Diagnosis not present

## 2023-11-17 DIAGNOSIS — I129 Hypertensive chronic kidney disease with stage 1 through stage 4 chronic kidney disease, or unspecified chronic kidney disease: Secondary | ICD-10-CM | POA: Diagnosis not present

## 2023-12-18 ENCOUNTER — Ambulatory Visit (HOSPITAL_COMMUNITY)
Admission: RE | Admit: 2023-12-18 | Discharge: 2023-12-18 | Disposition: A | Discharge status: Home / Self Care | Source: Ambulatory Visit | Attending: General Practice

## 2023-12-18 DIAGNOSIS — R0609 Other forms of dyspnea: Secondary | ICD-10-CM | POA: Diagnosis not present

## 2023-12-21 ENCOUNTER — Encounter: Payer: Self-pay | Admitting: *Deleted

## 2023-12-21 LAB — ECHOCARDIOGRAM COMPLETE
AR max vel: 1.09 cm2
AV Area VTI: 1.06 cm2
AV Area mean vel: 1.22 cm2
AV Mean grad: 16 mmHg
AV Peak grad: 32.5 mmHg
Ao pk vel: 2.85 m/s
Area-P 1/2: 3.03 cm2
MV M vel: 2.75 m/s
MV Peak grad: 30.3 mmHg
S' Lateral: 2.66 cm

## 2023-12-23 NOTE — Progress Notes (Signed)
 Cardiology Clinic Note   Patient Name: Richard Hatfield Date of Encounter: 12/25/2023  Primary Care Provider:  Tisovec, Richard W, MD Primary Cardiologist:  Magnus Schuller, MD  Patient Profile    [name] 68 year old male presents to the clinic today for follow-up evaluation of his  shortness of breath.  Past Medical History    Past Medical History:  Diagnosis Date   Cardiac arrest (HCC) 02/28/2020   Coronary artery disease 02/2020   pt admitted for STEMI in 02/2020 (per pt)   Diabetes mellitus without complication (HCC)    Hypertension    Myocardial infarction [facility])    per pt he had a heart attack earlier (02/2020) - see encounters   Past Surgical History:  Procedure Laterality Date   CORONARY ARTERY BYPASS GRAFT N/A 04/06/2020   Procedure: CORONARY ARTERY BYPASS GRAFTING (CABG), ON PUMP, TIMES FOUR, USING BILATERAL INTERNAL MAMMARY ARTERIES AND HARVESTED LEFT RADIAL ARTERY (OPEN);  Surgeon: Rudine Cos, MD;  Location: [facility] OR;  Service: Open Heart Surgery;  Laterality: N/A;   LEFT HEART CATH AND CORONARY ANGIOGRAPHY N/A 02/28/2020   Procedure: LEFT HEART CATH AND CORONARY ANGIOGRAPHY;  Surgeon: Millicent Ally, MD;  Location: [facility] INVASIVE CV LAB;  Service: Cardiovascular;  Laterality: N/A;   RADIAL ARTERY HARVEST Left 04/06/2020   Procedure: RADIAL ARTERY HARVEST;  Surgeon: Rudine Cos, MD;  Location: [facility] OR;  Service: Open Heart Surgery;  Laterality: Left;   TEE WITHOUT CARDIOVERSION N/A 04/06/2020   Procedure: TRANSESOPHAGEAL ECHOCARDIOGRAM (TEE);  Surgeon: Rudine Cos, MD;  Location: [facility] OR;  Service: Open Heart Surgery;  Laterality: N/A;    Allergies  Allergies  Allergen Reactions   Bee Venom Anaphylaxis    History of Present Illness    Richard Hatfield has a past medical history of cardiac arrest, ACS, coronary artery disease, type 2 diabetes, acute encephalopathy, anoxic brain injury, AKI, transaminitis, CABG x4 on 04/06/2020.  He was last seen by  me on 05/13/2021.  During that time he felt well.  He continued to be physically active doing yard work at home.  He was maintaining his low-sodium diet.  He checked his blood pressure regularly.  We reviewed his echocardiogram and cardiac ultrasound.  Follow-up was planned for 6 to 8 months.  He continue to follow-up with cardiology regularly.  He was last seen by Neomi Banks, NP on 06/29/2023.  He presented with his wife.  His weight was up about 38 pounds from 1 year prior.  He noted that he had not been exercising regularly.  He did note a few episodes of exertional dyspnea over the prior month when he was increasing his physical activity.  He denied chest pain, lower extremity swelling, orthopnea, PND.  His EKG was stable with lateral T wave inversion.  His blood pressure was noted to be 116/72.  He was instructed to contact office if exertional dyspnea worsened.  Ischemic evaluation was discussed with possibilities of Myoview versus cardiac PET.  His irbesartan  and amlodipine  were stopped and amlodipine -olmesartan  5 mg - 40 mg was started.  43-month follow-up was planned.  He contacted the nurse triage line 11/09/2023.  He noted increased work of breathing.  He was added to my schedule.  He presented to the clinic 11/10/23 for evaluation and stated he noticed increased work of breathing with increased physical activity.  His weight was 228 pounds.  He had gained about 50 pounds in one year and 4 months.  When asked about  his diet his wife reported that he had been drinking several beers per day.  He reported that he consumed about 8 beers daily.  He did note that some days he did not drink.  We reviewed the calorie content of alcohol.  He and his wife expressed understanding.  I encouraged him to reduce slowly and quit EtOH.  They expressed understanding.  I advised increased physical activity.  I will order a CBC today, BMP, order echocardiogram for DOE and plan follow-up after testing.  His lab work  showed elevated glucose at 248.  His echocardiogram 12/18/2023 showed an LVEF of 65-70%, mild left ventricular hypertrophy, G1 DD, mild-moderate aortic valve stenosis and no other significant valvular abnormalities.  He presents to the clinic today for follow-up evaluation and states he had initially increased his physical activity but is now becoming more sedentary again.  He is reducing his EtOH consumption.  He reports that he is trending a sixpack about 3 times per week.  We reviewed the importance of reduced calorie diet.  I encouraged him to continue to taper back his EtOH.  He expressed understanding.  We reviewed his echocardiogram which is reassuring.  He does note a mild cough.  He reports only occasional production and no color to his mucus.  He does have a follow-up appointment scheduled with his PCP this Friday.  I will plan follow-up in 6 months time.   Today he denies chest pain, lower extremity edema, fatigue, palpitations, melena, hematuria, hemoptysis, diaphoresis, weakness, presyncope, syncope, orthopnea, and PND.    Home Medications    Prior to Admission medications   Medication Sig Start Date End Date Taking? Authorizing Provider  acetaminophen  (TYLENOL ) 325 MG tablet Take 1-2 tablets (325-650 mg total) by mouth every 4 (four) hours as needed for mild pain. 03/20/20   Love, Renay Carota, PA-C  ALPHAGAN P 0.1 % SOLN Place 1 drop into both eyes daily. 06/18/22   [provider]  ALPRAZolam (XANAX) 0.25 MG tablet Take 0.25 mg by mouth 3 (three) times daily as needed for anxiety.    [provider]  amLODipine -olmesartan  (AZOR ) 5-40 MG tablet Take 1 tablet by mouth at bedtime. 06/29/23   Clearnce Curia, NP  aspirin  81 MG chewable tablet Chew 1 tablet (81 mg total) by mouth daily. 03/09/20   Ezenduka, Nkeiruka J, MD  calcium  carbonate (TUMS - DOSED IN MG ELEMENTAL CALCIUM ) 500 MG chewable tablet Chew 1 tablet (200 mg of elemental calcium  total) by mouth 2 (two) times  daily. Patient taking differently: Chew 1 tablet by mouth 3 (three) times daily. 03/20/20   Love, Renay Carota, PA-C  clopidogrel  (PLAVIX ) 75 MG tablet Take 1 tablet (75 mg total) by mouth daily. 04/10/20   Allegra Arch, PA-C  docusate sodium  (COLACE) 100 MG capsule Take 200 mg by mouth 2 (two) times daily. Patient not taking: Reported on 06/29/2023 07/02/22   [provider]  empagliflozin  (JARDIANCE ) 25 MG TABS tablet Take 1 tablet (25 mg total) by mouth daily. 03/20/20   Love, Renay Carota, PA-C  EPINEPHrine  0.3 mg/0.3 mL IJ SOAJ injection Inject 1 mL into the skin See admin instructions. 02/24/11   [provider]  escitalopram (LEXAPRO) 20 MG tablet Take 20 mg by mouth daily. 07/02/22   [provider]  glucose blood (ONETOUCH VERIO) test strip 1 each by Other route daily. 04/17/15   [provider]  insulin  aspart (NOVOLOG ) 100 UNIT/ML injection Inject 8 Units into the skin 3 (three)  times daily before meals.    [provider]  insulin  degludec (TRESIBA  FLEXTOUCH) 100 UNIT/ML FlexTouch Pen Inject 30 Units into the skin daily.    [provider]  Insulin  Pen Needle (B-D ULTRAFINE III SHORT PEN) 31G X 8 MM MISC USE 4 TIMES A DAY AS DIRECTED 01/20/19   [provider]  Lancets (ONETOUCH DELICA PLUS LANCET33G) MISC USE TO SELF MONITOR BLOOD GLUCOSE THREE TIMES DAILY    [provider]  LINZESS 72 MCG capsule Take 72 mcg by mouth daily before breakfast. Patient not taking: Reported on 06/29/2023    [provider]  metoprolol  tartrate (LOPRESSOR ) 25 MG tablet Take 0.5 tablets (12.5 mg total) by mouth 2 (two) times daily. 04/11/20   Zimmerman, Donielle M, PA-C  Multiple Vitamin (MULTIVITAMIN WITH MINERALS) TABS tablet Take 1 tablet by mouth daily. 03/20/20   Love, Renay Carota, PA-C  pantoprazole  (PROTONIX ) 40 MG tablet Take 40 mg by mouth 2 (two) times daily. 02/28/22   [provider]  polyethylene glycol (MIRALAX  /  GLYCOLAX ) 17 g packet Take 17 g by mouth 2 (two) times daily. Patient not taking: Reported on 06/29/2023 03/20/20   Love, Renay Carota, PA-C  rosuvastatin  (CRESTOR ) 40 MG tablet Take 1 tablet (40 mg total) by mouth daily. 03/20/20   LoveRenay Carota, PA-C    Family History    No family history on file. has no family status information on file.   Social History    Social History   Socioeconomic History   Marital status: Married    Spouse name: Martize Lary   Number of children: Not on file   Years of education: Not on file   Highest education level: Not on file  Occupational History   Not on file  Tobacco Use   Smoking status: Never   Smokeless tobacco: Never  Vaping Use   Vaping status: Never Used  Substance and Sexual Activity   Alcohol use: Not Currently   Drug use: Never   Sexual activity: Not Currently    Partners: Female  Other Topics Concern   Not on file  Social History Narrative   Not on file   Social Drivers of Health   Financial Resource Strain: Not on file  Food Insecurity: Not on file  Transportation Needs: Not on file  Physical Activity: Not on file  Stress: Not on file  Social Connections: Not on file  Intimate Partner Violence: Not on file     Review of Systems    General:  No chills, fever, night sweats or weight changes.  Cardiovascular:  No chest pain, dyspnea on exertion, edema, orthopnea, palpitations, paroxysmal nocturnal dyspnea. Dermatological: No rash, lesions/masses Respiratory: No cough, dyspnea Urologic: No hematuria, dysuria Abdominal:   No nausea, vomiting, diarrhea, bright red blood per rectum, melena, or hematemesis Neurologic:  No visual changes, wkns, changes in mental status. All other systems reviewed and are otherwise negative except as noted above.  Physical Exam    VS:  BP 128/78 (BP Location: Left Arm, Patient Position: Sitting, Cuff Size: Normal)   Pulse 75   Ht 5\' 9"  (1.753 m)   Wt 227 lb 3.2 oz (103.1 kg)   SpO2 96%    BMI 33.55 kg/m  , BMI Body mass index is 33.55 kg/m. GEN: Well nourished, well developed, in no acute distress. HEENT: normal. Neck: Supple, no JVD, carotid bruits, or masses. Cardiac: RRR, no murmurs, rubs, or gallops. No clubbing, cyanosis, edema.  Radials/DP/PT 2+ and equal  bilaterally.  Respiratory:  Respirations regular and unlabored, clear to auscultation bilaterally. GI: Soft, nontender, nondistended, BS + x 4. MS: no deformity or atrophy. Skin: warm and dry, no rash. Neuro:  Strength and sensation are intact. Psych: Normal affect.  Accessory Clinical Findings    Recent Labs: 11/10/2023: BUN 18; Creatinine, Ser 1.21; Hemoglobin 14.1; Platelets 164; Potassium 4.7; Sodium 139   Recent Lipid Panel    Component Value Date/Time   CHOL 158 04/10/2021 0856   TRIG 374 (H) 04/10/2021 0856   HDL 37 (L) 04/10/2021 0856   CHOLHDL 4.3 04/10/2021 0856   CHOLHDL NOT REPORTED DUE TO HIGH TRIGLYCERIDES 02/28/2020 0126   VLDL UNABLE TO CALCULATE IF TRIGLYCERIDE OVER 400 mg/dL 78/29/5621 3086   LDLCALC 63 04/10/2021 0856   LDLDIRECT 61.9 02/28/2020 0126         ECG personally reviewed by me today-none today.   Echocardiogram 02/28/2020 IMPRESSIONS     1. Normal wall motion in visualized segments. Mid to distal anterolateral  wall not well visualized on apical images. Consider repeat with echo  contrast for wall motion when patient less agitated.. Left ventricular  ejection fraction, by estimation, is 50   to 55%. The left ventricle has low normal function. The left ventricle  has no regional wall motion abnormalities. There is mild concentric left  ventricular hypertrophy. Left ventricular diastolic parameters were  normal.   2. Right ventricular systolic function is normal. The right ventricular  size is normal. There is mildly elevated pulmonary artery systolic  pressure.   3. Left atrial size was mildly dilated.   4. The mitral valve is normal in structure. Trivial mitral  valve  regurgitation. No evidence of mitral stenosis.   5. The aortic valve has an indeterminant number of cusps. Aortic valve  regurgitation is not visualized. Mild aortic valve stenosis.   Comparison(s): No prior Echocardiogram.    Echocardiogram 04/26/2021 IMPRESSIONS     1. Left ventricular ejection fraction, by estimation, is 60 to 65%. The  left ventricle has normal function. The left ventricle has no regional  wall motion abnormalities. There is moderate left ventricular hypertrophy.  Left ventricular diastolic  parameters are consistent with Grade I diastolic dysfunction (impaired  relaxation).   2. Right ventricular systolic function is normal. The right ventricular  size is normal. There is normal pulmonary artery systolic pressure. The  estimated right ventricular systolic pressure is 29.0 mmHg.   3. Left atrial size was mildly dilated.   4. The mitral valve is grossly normal. Trivial mitral valve  regurgitation.   5. The aortic valve is tricuspid. There is mild calcification of the  aortic valve. There is mild thickening of the aortic valve. Aortic valve  regurgitation is not visualized. Mild aortic valve stenosis. Aortic valve  area, by VTI measures 1.58 cm.  Aortic valve mean gradient measures 16.0 mmHg. Aortic valve Vmax measures  2.89 m/s. Peak gradient 33 mmHg and DI is 0.46.   6. The inferior vena cava is normal in size with greater than 50%  respiratory variability, suggesting right atrial pressure of 3 mmHg.   Comparison(s): Changes from prior study are noted. 02/28/2020: LVEF 50-55%,  mild AS - mean gradient 8 mmHg.   FINDINGS   Left Ventricle: Left ventricular ejection fraction, by estimation, is 60  to 65%. The left ventricle has normal function. The left ventricle has no  regional wall motion abnormalities. The left ventricular internal cavity  size was normal in size. There is  moderate left ventricular hypertrophy. Left ventricular diastolic   parameters are consistent with Grade I diastolic dysfunction (impaired  relaxation). Indeterminate filling pressures.   Right Ventricle: The right ventricular size is normal. No increase in  right ventricular wall thickness. Right ventricular systolic function is  normal. There is normal pulmonary artery systolic pressure. The tricuspid  regurgitant velocity is 2.55 m/s, and   with an assumed right atrial pressure of 3 mmHg, the estimated right  ventricular systolic pressure is 29.0 mmHg.   Left Atrium: Left atrial size was mildly dilated.   Right Atrium: Right atrial size was normal in size.   Pericardium: There is no evidence of pericardial effusion.   Mitral Valve: The mitral valve is grossly normal. Trivial mitral valve  regurgitation.   Tricuspid Valve: The tricuspid valve is grossly normal. Tricuspid valve  regurgitation is trivial.   Aortic Valve: The aortic valve is tricuspid. There is mild calcification  of the aortic valve. There is mild thickening of the aortic valve. Aortic  valve regurgitation is not visualized. Mild aortic stenosis is present.  Aortic valve mean gradient measures   16.0 mmHg. Aortic valve peak gradient measures 33.4 mmHg. Aortic valve  area, by VTI measures 1.58 cm.   Pulmonic Valve: The pulmonic valve was normal in structure. Pulmonic valve  regurgitation is not visualized.   Aorta: The aortic root and ascending aorta are structurally normal, with  no evidence of dilitation.   Venous: The inferior vena cava is normal in size with greater than 50%  respiratory variability, suggesting right atrial pressure of 3 mmHg.   IAS/Shunts: No atrial level shunt detected by color flow Doppler.  Echocardiogram 12/18/23  IMPRESSIONS     1. Left ventricular ejection fraction, by estimation, is 65 to 70%. The  left ventricle has normal function. Left ventricular endocardial border  not optimally defined to evaluate regional wall motion. There is mild  left  ventricular hypertrophy. Left  ventricular diastolic parameters are consistent with Grade I diastolic  dysfunction (impaired relaxation).   2. Right ventricular systolic function is mildly reduced. The right  ventricular size is normal. Tricuspid regurgitation signal is inadequate  for assessing PA pressure.   3. The mitral valve is grossly normal. Trivial mitral valve  regurgitation. No evidence of mitral stenosis.   4. The aortic valve has an indeterminant number of cusps. There is  moderate calcification of the aortic valve. Aortic valve regurgitation is  not visualized. Mild to moderate aortic valve stenosis. Aortic valve area,  by VTI measures 1.06 cm. Aortic  valve mean gradient measures 16.0 mmHg. Aortic valve Vmax measures 2.85  m/s.   5. The inferior vena cava is normal in size with greater than 50%  respiratory variability, suggesting right atrial pressure of 3 mmHg.   Conclusion(s)/Recommendation(s): Future echocardiograms should be  performed with Definity contrast.   FINDINGS   Left Ventricle: Left ventricular ejection fraction, by estimation, is 65  to 70%. The left ventricle has normal function. Left ventricular  endocardial border not optimally defined to evaluate regional wall motion.  The left ventricular internal cavity  size was normal in size. There is mild left ventricular hypertrophy. Left  ventricular diastolic parameters are consistent with Grade I diastolic  dysfunction (impaired relaxation).   Right Ventricle: The right ventricular size is normal. No increase in  right ventricular wall thickness. Right ventricular systolic function is  mildly reduced. Tricuspid regurgitation signal is inadequate for assessing  PA pressure. The tricuspid  regurgitant velocity is  1.80 m/s, and with an assumed right atrial  pressure of 3 mmHg, the estimated right ventricular systolic pressure is  16.0 mmHg.   Left Atrium: Left atrial size was normal in size.    Right Atrium: Right atrial size was normal in size.   Pericardium: There is no evidence of pericardial effusion.   Mitral Valve: The mitral valve is grossly normal. Trivial mitral valve  regurgitation. No evidence of mitral valve stenosis.   Tricuspid Valve: The tricuspid valve is normal in structure. Tricuspid  valve regurgitation is trivial. No evidence of tricuspid stenosis.   Aortic Valve: The aortic valve has an indeterminant number of cusps. There  is moderate calcification of the aortic valve. Aortic valve regurgitation  is not visualized. Mild to moderate aortic stenosis is present. Aortic  valve mean gradient measures 16.0  mmHg. Aortic valve peak gradient measures 32.5 mmHg. Aortic valve area, by  VTI measures 1.06 cm.   Pulmonic Valve: The pulmonic valve was normal in structure. Pulmonic valve  regurgitation is trivial. No evidence of pulmonic stenosis.   Aorta: The aortic root is normal in size and structure.   Venous: The inferior vena cava is normal in size with greater than 50%  respiratory variability, suggesting right atrial pressure of 3 mmHg.   IAS/Shunts: The interatrial septum was not well visualized.    Assessment & Plan   1.  DOE, shortness of breath-he contacted nurse triage line on 11/09/2023.  Stable increased work of breathing.  Heart rate today 75 bpm.  Weight today 227 pounds.   Echocardiogram reassuring.  Details above.  Symptoms appear to be related to decreased activity tolerance and body habitus. Encouraged continued weight loss-may be a good candidate for semaglutide Continue with increased physical activity Again expressed importance of slowly reducing EtOH  Essential hypertension-BP today 128/78 Reviewed importance of maintaining blood pressure log. Continue amlodipine , olmesartan , metoprolol  Heart healthy low-sodium diet-salty 6 given Continue with increased physical activity  EtOH abuse-consuming on average 6 beers 3 times per week.   Again, reviewed calorie content of EtOH/beer. Reduce EtOH slowly-reviewed EtOH cessation recommended  Coronary artery disease/status post CABG x4-no chest pain and denies anginal type symptoms.  Underwent CABG 8/21.   Continue aspirin , Plavix , Jardiance , rosuvastatin  Heart healthy low-sodium diet-salty 6 reviewed Maintain physical activity as tolerated   Hyperlipidemia-LDL 47 on 07/01/23. Continue rosuvastatin  Heart healthy low-sodium high-fiber diet Continue to monitor        Disposition: Return to clinic in 6 months.   Chet Cota. Youssef Footman NP-C     12/25/2023, 4:22 PM [facility] [address] Office [phone] Fax [phone]    I spent 14 minutes examining this patient, reviewing medications, and using patient centered shared decision making involving their cardiac care.   I spent  20 minutes reviewing past medical history,  medications, and prior cardiac tests.

## 2023-12-25 ENCOUNTER — Encounter: Payer: Self-pay | Admitting: General Practice

## 2023-12-25 ENCOUNTER — Ambulatory Visit: Attending: General Practice | Admitting: General Practice

## 2023-12-25 VITALS — BP 128/78 | HR 75 | Ht 69.0 in | Wt 227.2 lb

## 2023-12-25 DIAGNOSIS — R0609 Other forms of dyspnea: Secondary | ICD-10-CM | POA: Diagnosis not present

## 2023-12-25 DIAGNOSIS — I10 Essential (primary) hypertension: Secondary | ICD-10-CM

## 2023-12-25 DIAGNOSIS — I25118 Atherosclerotic heart disease of native coronary artery with other forms of angina pectoris: Secondary | ICD-10-CM | POA: Diagnosis not present

## 2023-12-25 DIAGNOSIS — F1010 Alcohol abuse, uncomplicated: Secondary | ICD-10-CM | POA: Diagnosis not present

## 2023-12-25 DIAGNOSIS — E785 Hyperlipidemia, unspecified: Secondary | ICD-10-CM | POA: Diagnosis not present

## 2023-12-25 NOTE — Patient Instructions (Signed)
 Medication Instructions:  Your physician recommends that you continue on your current medications as directed. Please refer to the Current Medication list given to you today.  *If you need a refill on your cardiac medications before your next appointment, please call your pharmacy*  Lab Work: NONE If you have labs (blood work) drawn today and your tests are completely normal, you will receive your results only by: MyChart Message (if you have MyChart) OR A paper copy in the mail If you have any lab test that is abnormal or we need to change your treatment, we will call you to review the results.  Testing/Procedures: NONE  Follow-Up: At [facility], you and your health needs are our priority.  As part of our continuing mission to provide you with exceptional heart care, our providers are all part of one team.  This team includes your primary Cardiologist (physician) and Advanced Practice Providers or APPs (Physician Assistants and Nurse Practitioners) who all work together to provide you with the care you need, when you need it.  Your next appointment:   6 month(s)  Provider:   One of our Advanced Practice Providers (APPs): Melita Springer, PA-C  Friddie Jetty, NP Evaline Hill, NP  Theotis Flake, PA-C Lawana Pray, NP  Willis Harter, PA-C Lovette Rud, PA-C  [place], PA-C Ernest Dick, NP  Marlana Silvan, NP Marcie Sever, PA-C  Laquita Plant, PA-C    Dayna Dunn, PA-C  Scott Weaver, PA-C Palmer Bobo, NP Katlyn West, NP Callie Goodrich, PA-C  Evan Williams, PA-C Sheng Haley, PA-C  Xika Zhao, NP Kathleen Johnson, PA-C    We recommend signing up for the patient portal called "MyChart".  Sign up information is provided on this After Visit Summary.  MyChart is used to connect with patients for Virtual Visits (Telemedicine).  Patients are able to view lab/test results, encounter notes, upcoming appointments, etc.  Non-urgent messages can be sent to your provider as well.    To learn more about what you can do with MyChart, go to [email or web address].   Other Instructions YOUR PROVIDER RECOMMENDS THAT YOU INCREASE EXERCISE DECREASE ALCOHOL INTAKE  Heart-Healthy Eating Plan Eating a healthy diet is important for the health of your heart. A heart-healthy eating plan includes: Eating less unhealthy fats. Eating more healthy fats. Eating less salt in your food. Salt is also called sodium. Making other changes in your diet. Talk with your doctor or a diet specialist (dietitian) to create an eating plan that is right for you. What is my plan? Your doctor may recommend an eating plan that includes: Total fat: ______% or less of total calories a day. Saturated fat: ______% or less of total calories a day. Cholesterol: less than _________mg a day. Sodium: less than _________mg a day. What are tips for following this plan? Cooking Avoid frying your food. Try to bake, boil, grill, or broil it instead. You can also reduce fat by: Removing the skin from poultry. Removing all visible fats from meats. Steaming vegetables in water  or broth. Meal planning  At meals, divide your plate into four equal parts: Fill one-half of your plate with vegetables and green salads. Fill one-fourth of your plate with whole grains. Fill one-fourth of your plate with lean protein foods. Eat 2-4 cups of vegetables per day. One cup of vegetables is: 1 cup (91 g) broccoli or cauliflower florets. 2 medium carrots. 1 large bell pepper. 1 large sweet potato. 1 large tomato. 1 medium white potato. 2 cups (150 g) raw  leafy greens. Eat 1-2 cups of fruit per day. One cup of fruit is: 1 small apple 1 large banana 1 cup (237 g) mixed fruit, 1 large orange,  cup (82 g) dried fruit, 1 cup (240 mL) 100% fruit juice. Eat more foods that have soluble fiber. These are apples, broccoli, carrots, beans, peas, and barley. Try to get 20-30 g of fiber per day. Eat 4-5 servings of nuts,  legumes, and seeds per week: 1 serving of dried beans or legumes equals  cup (90 g) cooked. 1 serving of nuts is  oz (12 almonds, 24 pistachios, or 7 walnut halves). 1 serving of seeds equals  oz (8 g). General information Eat more home-cooked food. Eat less restaurant, buffet, and fast food. Limit or avoid alcohol. Limit foods that are high in starch and sugar. Avoid fried foods. Lose weight if you are overweight. Keep track of how much salt (sodium) you eat. This is important if you have high blood pressure. Ask your doctor to tell you more about this. Try to add vegetarian meals each week. Fats Choose healthy fats. These include olive oil and canola oil, flaxseeds, walnuts, almonds, and seeds. Eat more omega-3 fats. These include salmon, mackerel, sardines, tuna, flaxseed oil, and ground flaxseeds. Try to eat fish at least 2 times each week. Check food labels. Avoid foods with trans fats or high amounts of saturated fat. Limit saturated fats. These are often found in animal products, such as meats, butter, and cream. These are also found in plant foods, such as palm oil, palm kernel oil, and coconut oil. Avoid foods with partially hydrogenated oils in them. These have trans fats. Examples are stick margarine, some tub margarines, cookies, crackers, and other baked goods. What foods should I eat? Fruits All fresh, canned (in natural juice), or frozen fruits. Vegetables Fresh or frozen vegetables (raw, steamed, roasted, or grilled). Green salads. Grains Most grains. Choose whole wheat and whole grains most of the time. Rice and pasta, including brown rice and pastas made with whole wheat. Meats and other proteins Lean, well-trimmed beef, veal, pork, and lamb. Chicken and [place] without skin. All fish and shellfish. Wild duck, rabbit, pheasant, and venison. Egg whites or low-cholesterol egg substitutes. Dried beans, peas, lentils, and tofu. Seeds and most nuts. Dairy Low-fat or  nonfat cheeses, including ricotta and mozzarella. Skim or 1% milk that is liquid, powdered, or evaporated. Buttermilk that is made with low-fat milk. Nonfat or low-fat yogurt. Fats and oils Non-hydrogenated (trans-free) margarines. Vegetable oils, including soybean, sesame, sunflower, olive, peanut, safflower, corn, canola, and cottonseed. Salad dressings or mayonnaise made with a vegetable oil. Beverages Mineral water . Coffee and tea. Diet carbonated beverages. Sweets and desserts Sherbet, gelatin, and fruit ice. Small amounts of dark chocolate. Limit all sweets and desserts. Seasonings and condiments All seasonings and condiments. The items listed above may not be a complete list of foods and drinks you can eat. Contact a dietitian for more options. What foods should I avoid? Fruits Canned fruit in heavy syrup. Fruit in cream or butter sauce. Fried fruit. Limit coconut. Vegetables Vegetables cooked in cheese, cream, or butter sauce. Fried vegetables. Grains Breads that are made with saturated or trans fats, oils, or whole milk. Croissants. Sweet rolls. Donuts. High-fat crackers, such as cheese crackers. Meats and other proteins Fatty meats, such as hot dogs, ribs, sausage, bacon, rib-eye roast or steak. High-fat deli meats, such as salami and bologna. Caviar. Domestic duck and goose. Organ meats, such as liver. Dairy Cream,  sour cream, cream cheese, and creamed cottage cheese. Whole-milk cheeses. Whole or 2% milk that is liquid, evaporated, or condensed. Whole buttermilk. Cream sauce or high-fat cheese sauce. Yogurt that is made from whole milk. Fats and oils Meat fat, or shortening. Cocoa butter, hydrogenated oils, palm oil, coconut oil, palm kernel oil. Solid fats and shortenings, including bacon fat, salt pork, lard, and butter. Nondairy cream substitutes. Salad dressings with cheese or sour cream. Beverages Regular sodas and juice drinks with added sugar. Sweets and  desserts Frosting. Pudding. Cookies. Cakes. Pies. Milk chocolate or white chocolate. Buttered syrups. Full-fat ice cream or ice cream drinks. The items listed above may not be a complete list of foods and drinks to avoid. Contact a dietitian for more information. Summary Heart-healthy meal planning includes eating less unhealthy fats, eating more healthy fats, and making other changes in your diet. Eat a balanced diet. This includes fruits and vegetables, low-fat or nonfat dairy, lean protein, nuts and legumes, whole grains, and heart-healthy oils and fats. This information is not intended to replace advice given to you by your health care provider. Make sure you discuss any questions you have with your health care provider. Document Revised: 09/09/2021 Document Reviewed: 09/09/2021 Elsevier Patient Education  2024 [employer].

## 2023-12-30 ENCOUNTER — Ambulatory Visit: Payer: Self-pay

## 2023-12-31 DIAGNOSIS — G931 Anoxic brain damage, not elsewhere classified: Secondary | ICD-10-CM | POA: Diagnosis not present

## 2023-12-31 DIAGNOSIS — I2510 Atherosclerotic heart disease of native coronary artery without angina pectoris: Secondary | ICD-10-CM | POA: Diagnosis not present

## 2023-12-31 DIAGNOSIS — E7800 Pure hypercholesterolemia, unspecified: Secondary | ICD-10-CM | POA: Diagnosis not present

## 2023-12-31 DIAGNOSIS — I129 Hypertensive chronic kidney disease with stage 1 through stage 4 chronic kidney disease, or unspecified chronic kidney disease: Secondary | ICD-10-CM | POA: Diagnosis not present

## 2023-12-31 DIAGNOSIS — Z794 Long term (current) use of insulin: Secondary | ICD-10-CM | POA: Diagnosis not present

## 2023-12-31 DIAGNOSIS — E1129 Type 2 diabetes mellitus with other diabetic kidney complication: Secondary | ICD-10-CM | POA: Diagnosis not present

## 2023-12-31 DIAGNOSIS — I472 Ventricular tachycardia, unspecified: Secondary | ICD-10-CM | POA: Diagnosis not present

## 2023-12-31 DIAGNOSIS — E663 Overweight: Secondary | ICD-10-CM | POA: Diagnosis not present

## 2023-12-31 DIAGNOSIS — N182 Chronic kidney disease, stage 2 (mild): Secondary | ICD-10-CM | POA: Diagnosis not present

## 2024-01-05 ENCOUNTER — Telehealth: Payer: Self-pay

## 2024-01-05 ENCOUNTER — Other Ambulatory Visit (HOSPITAL_COMMUNITY): Payer: Self-pay

## 2024-01-05 NOTE — Telephone Encounter (Signed)
 Pharmacy Patient Advocate Encounter   Received notification from CoverMyMeds that prior authorization for VASCEPA  is required/requested.   Insurance verification completed.   The patient is insured through [employer] .   Per test claim: The current 30 day co-pay is, $40.  No PA needed at this time. This test claim was processed through [facility]- copay amounts may vary at other pharmacies due to pharmacy/plan contracts, or as the patient moves through the different stages of their insurance plan.

## 2024-02-23 DIAGNOSIS — E113293 Type 2 diabetes mellitus with mild nonproliferative diabetic retinopathy without macular edema, bilateral: Secondary | ICD-10-CM | POA: Diagnosis not present

## 2024-02-23 DIAGNOSIS — H524 Presbyopia: Secondary | ICD-10-CM | POA: Diagnosis not present

## 2024-02-23 DIAGNOSIS — H47013 Ischemic optic neuropathy, bilateral: Secondary | ICD-10-CM | POA: Diagnosis not present

## 2024-02-23 DIAGNOSIS — H53451 Other localized visual field defect, right eye: Secondary | ICD-10-CM | POA: Diagnosis not present

## 2024-02-23 DIAGNOSIS — H52223 Regular astigmatism, bilateral: Secondary | ICD-10-CM | POA: Diagnosis not present

## 2024-03-23 DIAGNOSIS — I2510 Atherosclerotic heart disease of native coronary artery without angina pectoris: Secondary | ICD-10-CM | POA: Diagnosis not present

## 2024-03-23 DIAGNOSIS — Z794 Long term (current) use of insulin: Secondary | ICD-10-CM | POA: Diagnosis not present

## 2024-03-23 DIAGNOSIS — I129 Hypertensive chronic kidney disease with stage 1 through stage 4 chronic kidney disease, or unspecified chronic kidney disease: Secondary | ICD-10-CM | POA: Diagnosis not present

## 2024-03-23 DIAGNOSIS — E1129 Type 2 diabetes mellitus with other diabetic kidney complication: Secondary | ICD-10-CM | POA: Diagnosis not present

## 2024-03-23 DIAGNOSIS — N182 Chronic kidney disease, stage 2 (mild): Secondary | ICD-10-CM | POA: Diagnosis not present

## 2024-03-31 DIAGNOSIS — I472 Ventricular tachycardia, unspecified: Secondary | ICD-10-CM | POA: Diagnosis not present

## 2024-03-31 DIAGNOSIS — I129 Hypertensive chronic kidney disease with stage 1 through stage 4 chronic kidney disease, or unspecified chronic kidney disease: Secondary | ICD-10-CM | POA: Diagnosis not present

## 2024-03-31 DIAGNOSIS — E1129 Type 2 diabetes mellitus with other diabetic kidney complication: Secondary | ICD-10-CM | POA: Diagnosis not present

## 2024-03-31 DIAGNOSIS — I2510 Atherosclerotic heart disease of native coronary artery without angina pectoris: Secondary | ICD-10-CM | POA: Diagnosis not present

## 2024-03-31 DIAGNOSIS — G931 Anoxic brain damage, not elsewhere classified: Secondary | ICD-10-CM | POA: Diagnosis not present

## 2024-03-31 DIAGNOSIS — E663 Overweight: Secondary | ICD-10-CM | POA: Diagnosis not present

## 2024-03-31 DIAGNOSIS — N182 Chronic kidney disease, stage 2 (mild): Secondary | ICD-10-CM | POA: Diagnosis not present

## 2024-03-31 DIAGNOSIS — E7800 Pure hypercholesterolemia, unspecified: Secondary | ICD-10-CM | POA: Diagnosis not present

## 2024-06-02 ENCOUNTER — Other Ambulatory Visit (HOSPITAL_BASED_OUTPATIENT_CLINIC_OR_DEPARTMENT_OTHER): Payer: Self-pay | Admitting: Family

## 2024-07-05 DIAGNOSIS — Z1212 Encounter for screening for malignant neoplasm of rectum: Secondary | ICD-10-CM | POA: Diagnosis not present

## 2024-07-05 DIAGNOSIS — E7849 Other hyperlipidemia: Secondary | ICD-10-CM | POA: Diagnosis not present

## 2024-07-12 DIAGNOSIS — E1122 Type 2 diabetes mellitus with diabetic chronic kidney disease: Secondary | ICD-10-CM | POA: Diagnosis not present

## 2024-07-12 DIAGNOSIS — Z0000 Encounter for general adult medical examination without abnormal findings: Secondary | ICD-10-CM | POA: Diagnosis not present

## 2024-07-12 DIAGNOSIS — Z794 Long term (current) use of insulin: Secondary | ICD-10-CM | POA: Diagnosis not present

## 2024-07-12 DIAGNOSIS — E1165 Type 2 diabetes mellitus with hyperglycemia: Secondary | ICD-10-CM | POA: Diagnosis not present

## 2024-07-12 DIAGNOSIS — I129 Hypertensive chronic kidney disease with stage 1 through stage 4 chronic kidney disease, or unspecified chronic kidney disease: Secondary | ICD-10-CM | POA: Diagnosis not present

## 2024-07-12 DIAGNOSIS — R82998 Other abnormal findings in urine: Secondary | ICD-10-CM | POA: Diagnosis not present

## 2024-07-12 DIAGNOSIS — N182 Chronic kidney disease, stage 2 (mild): Secondary | ICD-10-CM | POA: Diagnosis not present

## 2024-07-12 DIAGNOSIS — G931 Anoxic brain damage, not elsewhere classified: Secondary | ICD-10-CM | POA: Diagnosis not present

## 2024-07-12 DIAGNOSIS — E663 Overweight: Secondary | ICD-10-CM | POA: Diagnosis not present

## 2024-07-13 DIAGNOSIS — E7849 Other hyperlipidemia: Secondary | ICD-10-CM | POA: Diagnosis not present

## 2024-07-13 DIAGNOSIS — Z1212 Encounter for screening for malignant neoplasm of rectum: Secondary | ICD-10-CM | POA: Diagnosis not present

## 2024-08-31 ENCOUNTER — Telehealth (HOSPITAL_BASED_OUTPATIENT_CLINIC_OR_DEPARTMENT_OTHER): Payer: Self-pay | Admitting: *Deleted

## 2024-08-31 NOTE — Telephone Encounter (Signed)
"  ° °  Pre-operative Risk Assessment    Patient Name: Richard Hatfield  DOB: 1956-02-05 MRN: [id number]  Date of last office visit: 12/25/2023 Date of next office visit: 09/07/24 Pre op added to upcoming appointment notes.  Request for Surgical Clearance    Procedure:  Colonoscopy   Date of Surgery:  Clearance 10/03/24                                 Surgeon:  Dr. Layla Lah Surgeon's Group or Practice Name:  [facility] Phone number:  [phone] Fax number:  [phone]   Type of Clearance Requested:   - Medical  - Pharmacy:  Hold Aspirin  and Clopidogrel  (Plavix ) 5 days prior to procedure.   Type of Anesthesia:  Propofol    Additional requests/questions:    Signed, Edsel Grayce Sanders   08/31/2024, 5:10 PM   "

## 2024-09-01 NOTE — Telephone Encounter (Signed)
" ° °  Name: Richard Hatfield  DOB: [birth year]  MRN: [id number]  Primary Cardiologist: Debby Sor, MD (Inactive)  Chart reviewed as part of pre-operative protocol coverage. The patient has an upcoming visit scheduled with Josefa Beauvais, NP on 09/07/2024 at which time clearance can be addressed in case there are any issues that would impact surgical recommendations.  Colonoscopy is not scheduled until TBD as below. I added preop FYI to appointment note so that provider is aware to address at time of outpatient visit.  Per office protocol the cardiology provider should forward their finalized clearance decision and recommendations regarding antiplatelet therapy to the requesting party below.    He may hold Plavix  for 5 days prior to procedure. Please resume Plavix  as soon as possible postprocedure, at the discretion of the surgeon.    Ideally aspirin  should be continued without interruption, however if the bleeding risk is too great, aspirin  may be held for 5-7 days prior to surgery. Please resume aspirin  post operatively when it is felt to be safe from a bleeding standpoint.    I will route this message as FYI to requesting party and remove this message from the preop box as separate preop APP input not needed at this time.   Please call with any questions.  Lum LITTIE Louis, NP  09/01/2024, 11:13 AM   "

## 2024-09-02 ENCOUNTER — Ambulatory Visit: Admitting: General Practice

## 2024-09-05 NOTE — Progress Notes (Unsigned)
**Note Richard-Identified via Obfuscation**  "   Cardiology Clinic Note   Patient Name: DEMONTAY Hatfield Date of Encounter: 09/07/2024  Primary Care Provider:  Tisovec, Richard W, MD Primary Cardiologist:  Richard Hatfield, Richard Hatfield  Patient Profile    Richard Hatfield 69 year old male presents to the clinic today for follow-up evaluation of his  shortness of breath.  Past Medical History    Past Medical History:  Diagnosis Date   Cardiac arrest (HCC) 02/28/2020   Coronary artery disease 02/2020   pt admitted for STEMI in 02/2020 (per pt)   Diabetes mellitus without complication (HCC)    Hypertension    Myocardial infarction [facility])    per pt he had a heart attack earlier (02/2020) - see encounters   Past Surgical History:  Procedure Laterality Date   CORONARY ARTERY BYPASS GRAFT N/A 04/06/2020   Procedure: CORONARY ARTERY BYPASS GRAFTING (CABG), ON PUMP, TIMES FOUR, USING BILATERAL INTERNAL MAMMARY ARTERIES AND HARVESTED LEFT RADIAL ARTERY (OPEN);  Surgeon: German Bartlett PEDLAR, MD;  Location: [facility] OR;  Service: Open Heart Surgery;  Laterality: N/A;   LEFT HEART CATH AND CORONARY ANGIOGRAPHY N/A 02/28/2020   Procedure: LEFT HEART CATH AND CORONARY ANGIOGRAPHY;  Surgeon: Burnard Debby LABOR, MD;  Location: [facility] INVASIVE CV LAB;  Service: Cardiovascular;  Laterality: N/A;   RADIAL ARTERY HARVEST Left 04/06/2020   Procedure: RADIAL ARTERY HARVEST;  Surgeon: German Bartlett PEDLAR, MD;  Location: [facility] OR;  Service: Open Heart Surgery;  Laterality: Left;   TEE WITHOUT CARDIOVERSION N/A 04/06/2020   Procedure: TRANSESOPHAGEAL ECHOCARDIOGRAM (TEE);  Surgeon: German Bartlett PEDLAR, MD;  Location: [facility] OR;  Service: Open Heart Surgery;  Laterality: N/A;    Allergies  Allergies  Allergen Reactions   Bee Venom Anaphylaxis    History of Present Illness    Richard Hatfield has a past medical history of cardiac arrest, ACS, coronary artery disease, type 2 diabetes, acute encephalopathy, anoxic brain injury, AKI, transaminitis, CABG x4 on 04/06/2020.  He was last seen by  me on 05/13/2021.  During that time he felt well.  He continued to be physically active doing yard work at home.  He was maintaining his low-sodium diet.  He checked his blood pressure regularly.  We reviewed his echocardiogram and cardiac ultrasound.  Follow-up was planned for 6 to 8 months.  He continue to follow-up with cardiology regularly.  He was last seen by Richard Finder, NP on 06/29/2023.  He presented with his wife.  His weight was up about 38 pounds from 1 year prior.  He noted that he had not been exercising regularly.  He did note a few episodes of exertional dyspnea over the prior month when he was increasing his physical activity.  He denied chest pain, lower extremity swelling, orthopnea, PND.  His EKG was stable with lateral T wave inversion.  His blood pressure was noted to be 116/72.  He was instructed to contact office if exertional dyspnea worsened.  Ischemic evaluation was discussed with possibilities of Myoview versus cardiac PET.  His irbesartan  and amlodipine  were stopped and amlodipine -olmesartan  5 mg - 40 mg was started.  74-month follow-up was planned.  He contacted the nurse triage line 11/09/2023.  He noted increased work of breathing.  He was added to my schedule.  He presented to the clinic 11/10/23 for evaluation and stated he noticed increased work of breathing with increased physical activity.  His weight was 228 pounds.  He had gained about 50 pounds in one year and 4 months.  When  asked about his diet his wife reported that he had been drinking several beers per day.  He reported that he consumed about 8 beers daily.  He did note that some days he did not drink.  We reviewed the calorie content of alcohol.  He and his wife expressed understanding.  I encouraged him to reduce slowly and quit EtOH.  They expressed understanding.  I advised increased physical activity.  I will order a CBC today, BMP, order echocardiogram for DOE and plan follow-up after testing.  His lab work  showed elevated glucose at 248.  His echocardiogram 12/18/2023 showed an LVEF of 65-70%, mild left ventricular hypertrophy, G1 DD, mild-moderate aortic valve stenosis and no other significant valvular abnormalities.  He presented to the clinic 12/25/23 for follow-up evaluation and stated he had initially increased his physical activity but was now becoming more sedentary again.  He was reducing his EtOH consumption.  He reported that he was drinking a sixpack about 3 times per week.  We reviewed the importance of reduced calorie diet.  I encouraged him to continue to taper back his EtOH.  He expressed understanding.  We reviewed his echocardiogram which was reassuring.  He did note a mild cough.  He reported only occasional production and no color to his mucus.  He did have a follow-up appointment scheduled with his PCP that Friday.  I  planned follow-up in 6 months.   He presents to the clinic today for follow-up evaluation and states he has been doing well from a cardiac standpoint.  We reviewed his medications.  He had recent lab work done with his PCP in November.  He is somewhat physically active and plans to be more physically active as the weather becomes nice.  He has lost 10 pounds.  He is taking Ozempic.  He is doing well with this.  His blood pressure today is 108/70.  His pulse was noted to be 77.  His EKG showed sinus rhythm.  I will plan to repeat his echocardiogram in May, request labs from his PCP and plan follow-up in 6 months..   Today he denies chest pain, lower extremity edema, fatigue, palpitations, melena, hematuria, hemoptysis, diaphoresis, weakness, presyncope, syncope, orthopnea, and PND.    Home Medications    Prior to Admission medications   Medication Sig Start Date End Date Taking? Authorizing Provider  acetaminophen  (TYLENOL ) 325 MG tablet Take 1-2 tablets (325-650 mg total) by mouth every 4 (four) hours as needed for mild pain. 03/20/20   Love, Sharlet RAMAN, PA-C  ALPHAGAN P 0.1  % SOLN Place 1 drop into both eyes daily. 06/18/22   [provider]  ALPRAZolam (XANAX) 0.25 MG tablet Take 0.25 mg by mouth 3 (three) times daily as needed for anxiety.    [provider]  amLODipine -olmesartan  (AZOR ) 5-40 MG tablet Take 1 tablet by mouth at bedtime. 06/29/23   Richard Richard RAMAN, NP  aspirin  81 MG chewable tablet Chew 1 tablet (81 mg total) by mouth daily. 03/09/20   Ezenduka, Nkeiruka J, MD  calcium  carbonate (TUMS - DOSED IN MG ELEMENTAL CALCIUM ) 500 MG chewable tablet Chew 1 tablet (200 mg of elemental calcium  total) by mouth 2 (two) times daily. Patient taking differently: Chew 1 tablet by mouth 3 (three) times daily. 03/20/20   Love, Sharlet RAMAN, PA-C  clopidogrel  (PLAVIX ) 75 MG tablet Take 1 tablet (75 mg total) by mouth daily. 04/10/20   Dwan Aldo M, PA-C  docusate sodium  (COLACE) 100 MG capsule Take  200 mg by mouth 2 (two) times daily. Patient not taking: Reported on 06/29/2023 07/02/22   [provider]  empagliflozin  (JARDIANCE ) 25 MG TABS tablet Take 1 tablet (25 mg total) by mouth daily. 03/20/20   Love, Sharlet RAMAN, PA-C  EPINEPHrine  0.3 mg/0.3 mL IJ SOAJ injection Inject 1 mL into the skin See admin instructions. 02/24/11   [provider]  escitalopram (LEXAPRO) 20 MG tablet Take 20 mg by mouth daily. 07/02/22   [provider]  glucose blood (ONETOUCH VERIO) test strip 1 each by Other route daily. 04/17/15   [provider]  insulin  aspart (NOVOLOG ) 100 UNIT/ML injection Inject 8 Units into the skin 3 (three) times daily before meals.    [provider]  insulin  degludec (TRESIBA  FLEXTOUCH) 100 UNIT/ML FlexTouch Pen Inject 30 Units into the skin daily.    [provider]  Insulin  Pen Needle (B-D ULTRAFINE III SHORT PEN) 31G X 8 MM MISC USE 4 TIMES A DAY AS DIRECTED 01/20/19   [provider]  Lancets (ONETOUCH DELICA PLUS LANCET33G) MISC USE TO SELF MONITOR BLOOD GLUCOSE THREE TIMES DAILY     [provider]  LINZESS 72 MCG capsule Take 72 mcg by mouth daily before breakfast. Patient not taking: Reported on 06/29/2023    [provider]  metoprolol  tartrate (LOPRESSOR ) 25 MG tablet Take 0.5 tablets (12.5 mg total) by mouth 2 (two) times daily. 04/11/20   Richard Hatfield, Richard M, PA-C  Multiple Vitamin (MULTIVITAMIN WITH MINERALS) TABS tablet Take 1 tablet by mouth daily. 03/20/20   Love, Sharlet RAMAN, PA-C  pantoprazole  (PROTONIX ) 40 MG tablet Take 40 mg by mouth 2 (two) times daily. 02/28/22   [provider]  polyethylene glycol (MIRALAX  / GLYCOLAX ) 17 g packet Take 17 g by mouth 2 (two) times daily. Patient not taking: Reported on 06/29/2023 03/20/20   Love, Sharlet RAMAN, PA-C  rosuvastatin  (CRESTOR ) 40 MG tablet Take 1 tablet (40 mg total) by mouth daily. 03/20/20   Maurice Sharlet RAMAN, PA-C    Family History    History reviewed. No pertinent family history. has no family status information on file.   Social History    Social History   Socioeconomic History   Marital status: Married    Spouse name: Richard Hatfield   Number of children: Not on file   Years of education: Not on file   Highest education level: Not on file  Occupational History   Not on file  Tobacco Use   Smoking status: Never   Smokeless tobacco: Never  Vaping Use   Vaping status: Never Used  Substance and Sexual Activity   Alcohol use: Not Currently   Drug use: Never   Sexual activity: Not Currently    Partners: Female  Other Topics Concern   Not on file  Social History Narrative   Not on file   Social Drivers of Health   Tobacco Use: Low Risk (09/07/2024)   Patient History    Smoking Tobacco Use: Never    Smokeless Tobacco Use: Never    Passive Exposure: Not on file  Financial Resource Strain: Not on file  Food Insecurity: Not on file  Transportation Needs: Not on file  Physical Activity: Not on file  Stress: Not on file  Social Connections: Not on file  Intimate Partner  Violence: Not on file  Depression (EYV7-0): Not on file  Alcohol Screen: Not on file  Housing: Not on file  Utilities: Not on file  Health Literacy: Not  on file     Review of Systems    General:  No chills, fever, night sweats or weight changes.  Cardiovascular:  No chest pain, dyspnea on exertion, edema, orthopnea, palpitations, paroxysmal nocturnal dyspnea. Dermatological: No rash, lesions/masses Respiratory: No cough, dyspnea Urologic: No hematuria, dysuria Abdominal:   No nausea, vomiting, diarrhea, bright red blood per rectum, melena, or hematemesis Neurologic:  No visual changes, wkns, changes in mental status. All other systems reviewed and are otherwise negative except as noted above.  Physical Exam    VS:  BP 108/70   Pulse 77   Ht 5' 9 (1.753 Hatfield)   Wt 217 lb (98.4 kg)   SpO2 95%   BMI 32.05 kg/Hatfield  , BMI Body mass index is 32.05 kg/Hatfield. GEN: Well nourished, well developed, in no acute distress. HEENT: normal. Neck: Supple, no JVD, carotid bruits, or masses. Cardiac: RRR, 3/6 systolic murmur heard along right sternal border, rubs, or gallops. No clubbing, cyanosis, edema.  Radials/DP/PT 2+ and equal bilaterally.  Respiratory:  Respirations regular and unlabored, clear to auscultation bilaterally. GI: Soft, nontender, nondistended, BS + x 4. MS: no deformity or atrophy. Skin: warm and dry, no rash. Neuro:  Strength and sensation are intact. Psych: Normal affect.  Accessory Clinical Findings    Recent Labs: 11/10/2023: BUN 18; Creatinine, Ser 1.21; Hemoglobin 14.1; Platelets 164; Potassium 4.7; Sodium 139   Recent Lipid Panel    Component Value Date/Time   CHOL 158 04/10/2021 0856   TRIG 374 (H) 04/10/2021 0856   HDL 37 (L) 04/10/2021 0856   CHOLHDL 4.3 04/10/2021 0856   CHOLHDL NOT REPORTED DUE TO HIGH TRIGLYCERIDES 02/28/2020 0126   VLDL UNABLE TO CALCULATE IF TRIGLYCERIDE OVER 400 mg/dL 92/86/7978 9873   LDLCALC 63 04/10/2021 0856   LDLDIRECT 61.9  02/28/2020 0126         ECG personally reviewed by me today-EKG Interpretation Date/Time:  Wednesday September 07 2024 15:27:21 EST Ventricular Rate:  77 PR Interval:  144 QRS Duration:  76 QT Interval:  390 QTC Calculation: 441 R Axis:   74  Text Interpretation: Normal sinus rhythm Confirmed by Emelia Hazy [phone] on 09/07/2024 3:32:34 PM    Echocardiogram 02/28/2020 IMPRESSIONS     1. Normal wall motion in visualized segments. Mid to distal anterolateral  wall not well visualized on apical images. Consider repeat with echo  contrast for wall motion when patient less agitated.. Left ventricular  ejection fraction, by estimation, is 50   to 55%. The left ventricle has low normal function. The left ventricle  has no regional wall motion abnormalities. There is mild concentric left  ventricular hypertrophy. Left ventricular diastolic parameters were  normal.   2. Right ventricular systolic function is normal. The right ventricular  size is normal. There is mildly elevated pulmonary artery systolic  pressure.   3. Left atrial size was mildly dilated.   4. The mitral valve is normal in structure. Trivial mitral valve  regurgitation. No evidence of mitral stenosis.   5. The aortic valve has an indeterminant number of cusps. Aortic valve  regurgitation is not visualized. Mild aortic valve stenosis.   Comparison(s): No prior Echocardiogram.    Echocardiogram 04/26/2021 IMPRESSIONS     1. Left ventricular ejection fraction, by estimation, is 60 to 65%. The  left ventricle has normal function. The left ventricle has no regional  wall motion abnormalities. There is moderate left ventricular hypertrophy.  Left ventricular diastolic  parameters are consistent with Grade I diastolic dysfunction (  impaired  relaxation).   2. Right ventricular systolic function is normal. The right ventricular  size is normal. There is normal pulmonary artery systolic pressure. The  estimated right  ventricular systolic pressure is 29.0 mmHg.   3. Left atrial size was mildly dilated.   4. The mitral valve is grossly normal. Trivial mitral valve  regurgitation.   5. The aortic valve is tricuspid. There is mild calcification of the  aortic valve. There is mild thickening of the aortic valve. Aortic valve  regurgitation is not visualized. Mild aortic valve stenosis. Aortic valve  area, by VTI measures 1.58 cm.  Aortic valve mean gradient measures 16.0 mmHg. Aortic valve Vmax measures  2.89 Hatfield/s. Peak gradient 33 mmHg and DI is 0.46.   6. The inferior vena cava is normal in size with greater than 50%  respiratory variability, suggesting right atrial pressure of 3 mmHg.   Comparison(s): Changes from prior study are noted. 02/28/2020: LVEF 50-55%,  mild AS - mean gradient 8 mmHg.   FINDINGS   Left Ventricle: Left ventricular ejection fraction, by estimation, is 60  to 65%. The left ventricle has normal function. The left ventricle has no  regional wall motion abnormalities. The left ventricular internal cavity  size was normal in size. There is   moderate left ventricular hypertrophy. Left ventricular diastolic  parameters are consistent with Grade I diastolic dysfunction (impaired  relaxation). Indeterminate filling pressures.   Right Ventricle: The right ventricular size is normal. No increase in  right ventricular wall thickness. Right ventricular systolic function is  normal. There is normal pulmonary artery systolic pressure. The tricuspid  regurgitant velocity is 2.55 Hatfield/s, and   with an assumed right atrial pressure of 3 mmHg, the estimated right  ventricular systolic pressure is 29.0 mmHg.   Left Atrium: Left atrial size was mildly dilated.   Right Atrium: Right atrial size was normal in size.   Pericardium: There is no evidence of pericardial effusion.   Mitral Valve: The mitral valve is grossly normal. Trivial mitral valve  regurgitation.   Tricuspid Valve: The  tricuspid valve is grossly normal. Tricuspid valve  regurgitation is trivial.   Aortic Valve: The aortic valve is tricuspid. There is mild calcification  of the aortic valve. There is mild thickening of the aortic valve. Aortic  valve regurgitation is not visualized. Mild aortic stenosis is present.  Aortic valve mean gradient measures   16.0 mmHg. Aortic valve peak gradient measures 33.4 mmHg. Aortic valve  area, by VTI measures 1.58 cm.   Pulmonic Valve: The pulmonic valve was normal in structure. Pulmonic valve  regurgitation is not visualized.   Aorta: The aortic root and ascending aorta are structurally normal, with  no evidence of dilitation.   Venous: The inferior vena cava is normal in size with greater than 50%  respiratory variability, suggesting right atrial pressure of 3 mmHg.   IAS/Shunts: No atrial level shunt detected by color flow Doppler.  Echocardiogram 12/18/23  IMPRESSIONS     1. Left ventricular ejection fraction, by estimation, is 65 to 70%. The  left ventricle has normal function. Left ventricular endocardial border  not optimally defined to evaluate regional wall motion. There is mild left  ventricular hypertrophy. Left  ventricular diastolic parameters are consistent with Grade I diastolic  dysfunction (impaired relaxation).   2. Right ventricular systolic function is mildly reduced. The right  ventricular size is normal. Tricuspid regurgitation signal is inadequate  for assessing PA pressure.   3. The mitral  valve is grossly normal. Trivial mitral valve  regurgitation. No evidence of mitral stenosis.   4. The aortic valve has an indeterminant number of cusps. There is  moderate calcification of the aortic valve. Aortic valve regurgitation is  not visualized. Mild to moderate aortic valve stenosis. Aortic valve area,  by VTI measures 1.06 cm. Aortic  valve mean gradient measures 16.0 mmHg. Aortic valve Vmax measures 2.85  Hatfield/s.   5. The inferior vena  cava is normal in size with greater than 50%  respiratory variability, suggesting right atrial pressure of 3 mmHg.   Conclusion(s)/Recommendation(s): Future echocardiograms should be  performed with Definity contrast.   FINDINGS   Left Ventricle: Left ventricular ejection fraction, by estimation, is 65  to 70%. The left ventricle has normal function. Left ventricular  endocardial border not optimally defined to evaluate regional wall motion.  The left ventricular internal cavity  size was normal in size. There is mild left ventricular hypertrophy. Left  ventricular diastolic parameters are consistent with Grade I diastolic  dysfunction (impaired relaxation).   Right Ventricle: The right ventricular size is normal. No increase in  right ventricular wall thickness. Right ventricular systolic function is  mildly reduced. Tricuspid regurgitation signal is inadequate for assessing  PA pressure. The tricuspid  regurgitant velocity is 1.80 Hatfield/s, and with an assumed right atrial  pressure of 3 mmHg, the estimated right ventricular systolic pressure is  16.0 mmHg.   Left Atrium: Left atrial size was normal in size.   Right Atrium: Right atrial size was normal in size.   Pericardium: There is no evidence of pericardial effusion.   Mitral Valve: The mitral valve is grossly normal. Trivial mitral valve  regurgitation. No evidence of mitral valve stenosis.   Tricuspid Valve: The tricuspid valve is normal in structure. Tricuspid  valve regurgitation is trivial. No evidence of tricuspid stenosis.   Aortic Valve: The aortic valve has an indeterminant number of cusps. There  is moderate calcification of the aortic valve. Aortic valve regurgitation  is not visualized. Mild to moderate aortic stenosis is present. Aortic  valve mean gradient measures 16.0  mmHg. Aortic valve peak gradient measures 32.5 mmHg. Aortic valve area, by  VTI measures 1.06 cm.   Pulmonic Valve: The pulmonic valve was  normal in structure. Pulmonic valve  regurgitation is trivial. No evidence of pulmonic stenosis.   Aorta: The aortic root is normal in size and structure.   Venous: The inferior vena cava is normal in size with greater than 50%  respiratory variability, suggesting right atrial pressure of 3 mmHg.   IAS/Shunts: The interatrial septum was not well visualized.    Assessment & Plan   1.  Coronary artery disease/status post CABG x4-no chest pain and denies anginal type symptoms.  Underwent CABG 8/21.   Continue aspirin , Plavix , Jardiance , rosuvastatin  Heart healthy low-sodium diet-salty 6 reviewed Maintain physical activity as tolerated  DOE, shortness of breath-denies increased work of breathing or DOE.  Weight today 217 pounds.    Continue weight loss Continue with increased physical activity Continue to decrease EtOH use  AS, G1DD- 3/6 systolic murmur heard along right sternal border.  Reviewed previous echocardiogram.  Well compensated.  Remains stable from a cardiac standpoint. Order Echo 5/26  Essential hypertension-BP today 108/70 Reviewed importance of maintaining blood pressure log. Continue amlodipine , olmesartan , metoprolol  Heart healthy low-sodium diet-salty 6 given Continue with increased physical activity  EtOH abuse-consuming on average 4 beers per day.  Does note that he does not drink  every day.  Again, reviewed calorie content of EtOH/beer. Reduce EtOH slowly-reviewed EtOH cessation recommended   Hyperlipidemia-LDL 47 on 07/01/23. Lab work 11/25. Lipids  and LFTS drawn at that time as well.  Continue rosuvastatin  Heart healthy low-sodium high-fiber diet Continue to monitor Request labs from PCP  Preoperative cardiac evaluation-Colonoscopy    Date of Surgery:  Clearance 10/03/24                                Surgeon:  Dr. Layla Lah Surgeon's Group or Practice Name:  [facility] Phone number:  [phone] Fax number:  [phone]    Primary  Cardiologist: Richard Hatfield, Richard Hatfield  Chart reviewed as part of pre-operative protocol coverage. Given past medical history and time since last visit, based on ACC/AHA guidelines, GOBLE FUDALA would be at acceptable risk for the planned procedure without further cardiovascular testing.   Patient was advised that if he develops new symptoms prior to surgery to contact our office to arrange a follow-up appointment.  He verbalized understanding.  His Plavix  may be held for 5 days prior to his procedure.  Please resume as soon as hemostasis is achieved.  Ideally aspirin  should be continued without interruption, however if the bleeding risk is too great, aspirin  may be held for 5-7 days prior to surgery. Please resume aspirin  post operatively when it is felt to be safe from a bleeding standpoint.    I will route this recommendation to the requesting party via Epic fax function and remove from pre-op pool.       Disposition: Follow-up with Dr.Segal or me in 6-9 months.   Josefa HERO. Danniela Mcbrearty NP-C     09/07/2024, 4:01 PM [facility] [address] Office [phone] Fax [phone]    I spent 14 minutes examining this patient, reviewing medications, and using patient centered shared decision making involving their cardiac care.   I spent  20 minutes reviewing past medical history,  medications, and prior cardiac tests.  "

## 2024-09-07 ENCOUNTER — Ambulatory Visit: Attending: Cardiology | Admitting: General Practice

## 2024-09-07 ENCOUNTER — Encounter: Payer: Self-pay | Admitting: General Practice

## 2024-09-07 VITALS — BP 108/70 | HR 77 | Ht 69.0 in | Wt 217.0 lb

## 2024-09-07 DIAGNOSIS — E785 Hyperlipidemia, unspecified: Secondary | ICD-10-CM

## 2024-09-07 DIAGNOSIS — I25118 Atherosclerotic heart disease of native coronary artery with other forms of angina pectoris: Secondary | ICD-10-CM | POA: Diagnosis not present

## 2024-09-07 DIAGNOSIS — I350 Nonrheumatic aortic (valve) stenosis: Secondary | ICD-10-CM

## 2024-09-07 DIAGNOSIS — R0609 Other forms of dyspnea: Secondary | ICD-10-CM

## 2024-09-07 DIAGNOSIS — Z01810 Encounter for preprocedural cardiovascular examination: Secondary | ICD-10-CM | POA: Diagnosis not present

## 2024-09-07 DIAGNOSIS — I10 Essential (primary) hypertension: Secondary | ICD-10-CM | POA: Diagnosis not present

## 2024-09-07 DIAGNOSIS — F1010 Alcohol abuse, uncomplicated: Secondary | ICD-10-CM

## 2024-09-07 DIAGNOSIS — I5022 Chronic systolic (congestive) heart failure: Secondary | ICD-10-CM

## 2024-09-07 NOTE — Patient Instructions (Addendum)
 Medication Instructions:   Your physician recommends that you continue on your current medications as directed. Please refer to the Current Medication list given to you today.  *If you need a refill on your cardiac medications before your next appointment, please call your pharmacy*   Lab Work:  NONE ORDERED  TODAY    If you have labs (blood work) drawn today and your tests are completely normal, you will receive your results only by: MyChart Message (if you have MyChart) OR A paper copy in the mail If you have any lab test that is abnormal or we need to change your treatment, we will call you to review the results.    Testing/Procedures: IN MAY Your physician has requested that you have an echocardiogram. Echocardiography is a painless test that uses sound waves to create images of your heart. It provides your doctor with information about the size and shape of your heart and how well your hearts chambers and valves are working. This procedure takes approximately one hour. There are no restrictions for this procedure. Please do NOT wear cologne, perfume, aftershave, or lotions (deodorant is allowed). Please arrive 15 minutes prior to your appointment time.  Please note: We ask at that you not bring children with you during ultrasound (echo/ vascular) testing. Due to room size and safety concerns, children are not allowed in the ultrasound rooms during exams. Our front office staff cannot provide observation of children in our lobby area while testing is being conducted. An adult accompanying a patient to their appointment will only be allowed in the ultrasound room at the discretion of the ultrasound technician under special circumstances. We apologize for any inconvenience.     Follow-Up:  At [facility], you and your health needs are our priority.  As part of our continuing mission to provide you with exceptional heart care, our providers are all part of one team.  This  team includes your primary Cardiologist (physician) and Advanced Practice Providers or APPs (Physician Assistants and Nurse Practitioners) who all work together to provide you with the care you need, when you need it.  Your next appointment:   6 month(s)   Provider:    Emeline FORBES Calender, DO CHARMAYNE    We recommend signing up for the patient portal called MyChart.  Sign up information is provided on this After Visit Summary.  MyChart is used to connect with patients for Virtual Visits (Telemedicine).  Patients are able to view lab/test results, encounter notes, upcoming appointments, etc.  Non-urgent messages can be sent to your provider as well.   To learn more about what you can do with MyChart, go to [email or web address].   Other Instructions

## 2024-11-22 ENCOUNTER — Ambulatory Visit

## 2025-01-05 ENCOUNTER — Ambulatory Visit (HOSPITAL_COMMUNITY)

## 2025-01-16 ENCOUNTER — Ambulatory Visit
